# Patient Record
Sex: Female | Born: 1975 | Race: White | Hispanic: No | Smoking: Current some day smoker
Health system: Southern US, Community
[De-identification: ages and names within clinical notes are randomized; demographics above are authoritative.]

## PROBLEM LIST (undated history)

## (undated) DIAGNOSIS — F419 Anxiety disorder, unspecified: Secondary | ICD-10-CM

## (undated) DIAGNOSIS — C539 Malignant neoplasm of cervix uteri, unspecified: Secondary | ICD-10-CM

## (undated) DIAGNOSIS — M25539 Pain in unspecified wrist: Secondary | ICD-10-CM

## (undated) DIAGNOSIS — Z923 Personal history of irradiation: Secondary | ICD-10-CM

## (undated) DIAGNOSIS — Z87442 Personal history of urinary calculi: Secondary | ICD-10-CM

## (undated) DIAGNOSIS — E119 Type 2 diabetes mellitus without complications: Secondary | ICD-10-CM

## (undated) DIAGNOSIS — C50919 Malignant neoplasm of unspecified site of unspecified female breast: Secondary | ICD-10-CM

## (undated) DIAGNOSIS — Z8489 Family history of other specified conditions: Secondary | ICD-10-CM

## (undated) DIAGNOSIS — I10 Essential (primary) hypertension: Secondary | ICD-10-CM

## (undated) DIAGNOSIS — R32 Unspecified urinary incontinence: Secondary | ICD-10-CM

## (undated) DIAGNOSIS — I82409 Acute embolism and thrombosis of unspecified deep veins of unspecified lower extremity: Secondary | ICD-10-CM

## (undated) DIAGNOSIS — R87619 Unspecified abnormal cytological findings in specimens from cervix uteri: Secondary | ICD-10-CM

## (undated) DIAGNOSIS — Z9221 Personal history of antineoplastic chemotherapy: Secondary | ICD-10-CM

## (undated) DIAGNOSIS — Z9289 Personal history of other medical treatment: Secondary | ICD-10-CM

## (undated) DIAGNOSIS — G629 Polyneuropathy, unspecified: Secondary | ICD-10-CM

## (undated) HISTORY — PX: TONSILLECTOMY AND ADENOIDECTOMY: SHX28

## (undated) HISTORY — DX: Anxiety disorder, unspecified: F41.9

## (undated) HISTORY — PX: OPEN REDUCTION INTERNAL FIXATION (ORIF) TIBIA/FIBULA FRACTURE: SHX5992

## (undated) HISTORY — DX: Malignant neoplasm of cervix uteri, unspecified: C53.9

## (undated) HISTORY — DX: Unspecified urinary incontinence: R32

## (undated) HISTORY — DX: Unspecified abnormal cytological findings in specimens from cervix uteri: R87.619

## (undated) HISTORY — PX: PORT-A-CATH REMOVAL: SHX5289

## (undated) HISTORY — DX: Essential (primary) hypertension: I10

---

## 1997-03-19 HISTORY — PX: CHOLECYSTECTOMY: SHX55

## 2011-03-20 HISTORY — PX: OPEN REDUCTION INTERNAL FIXATION (ORIF) TIBIA/FIBULA FRACTURE: SHX5992

## 2012-03-19 DIAGNOSIS — Z9221 Personal history of antineoplastic chemotherapy: Secondary | ICD-10-CM

## 2012-03-19 DIAGNOSIS — Z923 Personal history of irradiation: Secondary | ICD-10-CM

## 2012-03-19 HISTORY — DX: Personal history of irradiation: Z92.3

## 2012-03-19 HISTORY — DX: Personal history of antineoplastic chemotherapy: Z92.21

## 2012-03-19 HISTORY — PX: BREAST LUMPECTOMY: SHX2

## 2012-10-12 DIAGNOSIS — R87810 Cervical high risk human papillomavirus (HPV) DNA test positive: Secondary | ICD-10-CM | POA: Insufficient documentation

## 2012-10-13 ENCOUNTER — Telehealth: Payer: Self-pay | Admitting: *Deleted

## 2012-10-13 DIAGNOSIS — C50319 Malignant neoplasm of lower-inner quadrant of unspecified female breast: Secondary | ICD-10-CM | POA: Insufficient documentation

## 2012-10-13 DIAGNOSIS — C50311 Malignant neoplasm of lower-inner quadrant of right female breast: Secondary | ICD-10-CM

## 2012-10-13 NOTE — Telephone Encounter (Signed)
Pt called stating she would was recently dx with breast cancer in [place], [place] and would like to come to our breast clinic.  Confirmed appt on 10/22/12 at 1200.  Gave pt directions.  Requested all pathology and imaging.  Pt denies further needs at this time.

## 2012-10-16 ENCOUNTER — Other Ambulatory Visit: Payer: Self-pay | Admitting: Oncology

## 2012-10-16 ENCOUNTER — Other Ambulatory Visit: Payer: Self-pay | Admitting: *Deleted

## 2012-10-16 DIAGNOSIS — C50919 Malignant neoplasm of unspecified site of unspecified female breast: Secondary | ICD-10-CM

## 2012-10-16 HISTORY — DX: Malignant neoplasm of unspecified site of unspecified female breast: C50.919

## 2012-10-17 ENCOUNTER — Other Ambulatory Visit: Payer: Self-pay | Admitting: *Deleted

## 2012-10-17 ENCOUNTER — Other Ambulatory Visit: Payer: Self-pay | Admitting: Oncology

## 2012-10-17 DIAGNOSIS — C50912 Malignant neoplasm of unspecified site of left female breast: Secondary | ICD-10-CM

## 2012-10-17 DIAGNOSIS — C50919 Malignant neoplasm of unspecified site of unspecified female breast: Secondary | ICD-10-CM

## 2012-10-22 ENCOUNTER — Other Ambulatory Visit (INDEPENDENT_AMBULATORY_CARE_PROVIDER_SITE_OTHER): Payer: Self-pay

## 2012-10-22 ENCOUNTER — Other Ambulatory Visit (HOSPITAL_BASED_OUTPATIENT_CLINIC_OR_DEPARTMENT_OTHER): Payer: BC Managed Care – PPO | Admitting: Lab

## 2012-10-22 ENCOUNTER — Ambulatory Visit (HOSPITAL_BASED_OUTPATIENT_CLINIC_OR_DEPARTMENT_OTHER): Payer: BC Managed Care – PPO | Admitting: General Surgery

## 2012-10-22 ENCOUNTER — Ambulatory Visit (HOSPITAL_BASED_OUTPATIENT_CLINIC_OR_DEPARTMENT_OTHER): Payer: BC Managed Care – PPO | Admitting: Oncology

## 2012-10-22 ENCOUNTER — Encounter: Payer: Self-pay | Admitting: *Deleted

## 2012-10-22 ENCOUNTER — Encounter: Payer: Self-pay | Admitting: Oncology

## 2012-10-22 ENCOUNTER — Ambulatory Visit
Admission: RE | Admit: 2012-10-22 | Discharge: 2012-10-22 | Disposition: A | Discharge status: Home / Self Care | Payer: BC Managed Care – PPO | Source: Ambulatory Visit | Attending: Radiation Oncology | Admitting: Radiation Oncology

## 2012-10-22 ENCOUNTER — Ambulatory Visit: Payer: BC Managed Care – PPO | Attending: General Surgery | Admitting: Physical Therapy

## 2012-10-22 ENCOUNTER — Encounter (INDEPENDENT_AMBULATORY_CARE_PROVIDER_SITE_OTHER): Payer: Self-pay | Admitting: General Surgery

## 2012-10-22 ENCOUNTER — Ambulatory Visit: Payer: Self-pay

## 2012-10-22 ENCOUNTER — Encounter: Payer: Self-pay | Admitting: Specialist

## 2012-10-22 ENCOUNTER — Ambulatory Visit
Admission: RE | Admit: 2012-10-22 | Discharge: 2012-10-22 | Disposition: A | Discharge status: Home / Self Care | Payer: BC Managed Care – PPO | Source: Ambulatory Visit | Attending: Oncology | Admitting: Oncology

## 2012-10-22 ENCOUNTER — Telehealth (INDEPENDENT_AMBULATORY_CARE_PROVIDER_SITE_OTHER): Payer: Self-pay

## 2012-10-22 VITALS — BP 136/85 | HR 85 | Temp 98.0°F | Resp 20 | Ht 69.0 in | Wt 253.5 lb

## 2012-10-22 DIAGNOSIS — F17200 Nicotine dependence, unspecified, uncomplicated: Secondary | ICD-10-CM

## 2012-10-22 DIAGNOSIS — C50319 Malignant neoplasm of lower-inner quadrant of unspecified female breast: Secondary | ICD-10-CM

## 2012-10-22 DIAGNOSIS — C50912 Malignant neoplasm of unspecified site of left female breast: Secondary | ICD-10-CM

## 2012-10-22 DIAGNOSIS — C50312 Malignant neoplasm of lower-inner quadrant of left female breast: Secondary | ICD-10-CM

## 2012-10-22 DIAGNOSIS — R293 Abnormal posture: Secondary | ICD-10-CM | POA: Insufficient documentation

## 2012-10-22 DIAGNOSIS — C50311 Malignant neoplasm of lower-inner quadrant of right female breast: Secondary | ICD-10-CM

## 2012-10-22 DIAGNOSIS — Z170 Estrogen receptor positive status [ER+]: Secondary | ICD-10-CM

## 2012-10-22 DIAGNOSIS — IMO0001 Reserved for inherently not codable concepts without codable children: Secondary | ICD-10-CM | POA: Insufficient documentation

## 2012-10-22 LAB — CBC WITH DIFFERENTIAL/PLATELET
BASO%: 0.4 % (ref 0.0–2.0)
Basophils Absolute: 0.1 10*3/uL (ref 0.0–0.1)
EOS%: 1.6 % (ref 0.0–7.0)
HGB: 13.3 g/dL (ref 11.6–15.9)
MCH: 31.1 pg (ref 25.1–34.0)
MCHC: 34.3 g/dL (ref 31.5–36.0)
MCV: 90.7 fL (ref 79.5–101.0)
MONO%: 6.5 % (ref 0.0–14.0)
RBC: 4.28 10*6/uL (ref 3.70–5.45)
RDW: 13.6 % (ref 11.2–14.5)

## 2012-10-22 LAB — COMPREHENSIVE METABOLIC PANEL (CC13)
ALT: 20 U/L (ref 0–55)
AST: 18 U/L (ref 5–34)
Albumin: 3.4 g/dL — ABNORMAL LOW (ref 3.5–5.0)
Alkaline Phosphatase: 74 U/L (ref 40–150)
BUN: 12.7 mg/dL (ref 7.0–26.0)
Potassium: 3.5 mEq/L (ref 3.5–5.1)

## 2012-10-22 MED ORDER — GADOBENATE DIMEGLUMINE 529 MG/ML IV SOLN
20.0000 mL | Freq: Once | INTRAVENOUS | Status: AC | PRN
  Administered 2012-10-22: 20 mL via INTRAVENOUS

## 2012-10-22 NOTE — Telephone Encounter (Signed)
Message copied by Brennan Bailey on Wed Oct 22, 2012  5:11 PM ------      Message from: Caleen Essex III      Created: Wed Oct 22, 2012  3:47 PM       She needs a referral to Dr. Odis Luster in case her genetics is positive. thanks ------

## 2012-10-22 NOTE — Progress Notes (Signed)
Patient ID: [name], female   DOB: 1975/03/22, 37 y.o.   MRN: [id number]  No chief complaint on file.   HPI [name] is a 37 y.o. female.  We are asked to see the pt in consultation by Dr. Jean Rosenthal to evaluate her for left breast cancer. The pt is a 37 yo wf who recently went for her first baseline mammogram. At that time she was found to have a mass in the 8 o clock position of the left breast. 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 She denies any breast pain or discharge from the nipple. She had genetics done in [place]. She does have a grandmother with breast cancer. The mass measured 1.6cm by u/s. She was er and pr + and her2 neg. HPI  Past Medical History  Diagnosis Date  . Hypertension   . Anxiety     Past Surgical History  Procedure Laterality Date  . Cholecystectomy    . Tonsillectomy    . Cesarean section      Family History  Problem Relation Age of Onset  . Breast cancer Maternal Grandmother   . Lung cancer Paternal Grandmother     Social History History  Substance Use Topics  . Smoking status: Current Every Day Smoker -- 1.00 packs/day    Types: Cigarettes  . Smokeless tobacco: Not on file  . Alcohol Use: Yes     Comment: 1 drink a week    Allergies  Allergen Reactions  . Amoxicillin     Per pt "does not work"    Current Outpatient Prescriptions  Medication Sig Dispense Refill  . ALPRAZolam (XANAX) 0.25 MG tablet Take 0.25 mg by mouth at bedtime as needed for sleep.      . hydrochlorothiazide (HYDRODIURIL) 25 MG tablet Take 25 mg by mouth daily.      Marland Kitchen lisinopril (PRINIVIL,ZESTRIL) 10 MG tablet Take 10 mg by mouth daily.       No current facility-administered medications for this visit.    Review of Systems Review of Systems  Constitutional: Negative.   HENT: Negative.   Eyes: Negative.   Respiratory: Negative.   Cardiovascular: Negative.   Gastrointestinal: Negative.   Endocrine: Negative.   Genitourinary:  Negative.   Musculoskeletal: Negative.   Skin: Negative.   Allergic/Immunologic: Negative.   Neurological: Negative.   Hematological: Negative.   Psychiatric/Behavioral: Negative.     Last menstrual period 10/02/2012.  Physical Exam Physical Exam  Constitutional: She is oriented to person, place, and time. She appears well-developed and well-nourished.  HENT:  Head: Normocephalic and atraumatic.  Eyes: Conjunctivae and EOM are normal. Pupils are equal, round, and reactive to light.  Neck: Normal range of motion. Neck supple.  Cardiovascular: Normal rate, regular rhythm and normal heart sounds.   Pulmonary/Chest: Effort normal and breath sounds normal.  There is no palpable mass in either breast. There is no palpable axillary, supraclavicular, or cervical lymphadenopathy  Abdominal: Soft. Bowel sounds are normal. She exhibits no mass. There is no tenderness.  Musculoskeletal: Normal range of motion.  Lymphadenopathy:    She has no cervical adenopathy.  Neurological: She is alert and oriented to person, place, and time.  Skin: Skin is warm and dry.  Psychiatric: She has a normal mood and affect. Her behavior is normal.    Data Reviewed As above  Assessment    The pt appears to have a stage I left breast cancer. I have discussed with her in detail the different options  for treatment and she favors breast conservation as long as her genetic testing is negative. I think she is a good candidate for this as well as sentinel node mapping. I have discussed with her in detail the risks and benefits of surgery as well as some of the technical aspects and she understands and wishes to proceed     Plan    Plan for left breast wire localized lumpectomy and sentinel node mapping if her genetics is negative. Will refer to plastics in case genetics is positive        TOTH III,PAUL S 10/22/2012, 3:38 PM

## 2012-10-22 NOTE — Progress Notes (Signed)
[facility] Radiation Oncology NEW PATIENT EVALUATION  Name: [name] MRN: [id number]  Date:   10/22/2012           DOB: [birth year]  Status: outpatient   CC: 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 female who is seen today at the [facility] with Dr. Carolynne Edouard and Dr. Welton Flakes for evaluation of her T1 N0 invasive ductal carcinoma of the left breast. At the time of a screening mammogram in [place] on 09/23/2012 she is felt to have a new mass within the left breast. Additional views and ultrasound on 10/06/2012 showed a 1.6 x 1.3 x 1.3 cm mass at 8:00 position, 7 cm from the nipple. At the 12:00 position behind the nipple was a circumscribed oval shaped 2 x 1.8 x 1.2 cm lesion perhaps representing a fibroadenoma. Ultrasound-guided biopsies on 10/06/2012 revealed fibrocystic changes at 12:00 and invasive ductal carcinoma at 8:00. The carcinoma was grade II-III with a Ki-67 of approximately 25%. HER-2/neu was not amplified. She was ER/PR positive at 80%. Her pathology was reviewed here by Dr.  Colonel Bald.  She apparently had blood work sent off for genetic testing while in  [employer] yesterday. She has not spoken with a Runner, broadcasting/film/video. She had breast MRI scans earlier today. Report is not available    PREVIOUS RADIATION THERAPY: No   PAST MEDICAL HISTORY:  has a past medical history of Hypertension and Anxiety.     PAST SURGICAL HISTORY:  Past Surgical History  Procedure Laterality Date  . Cholecystectomy    . Tonsillectomy    . Cesarean section       FAMILY HISTORY: family history includes Breast cancer in her maternal grandmother and Lung cancer in her paternal grandmother. Single, 70 year old daughter. She works for [employer], site in [place]. She is a Designer, multimedia.  SOCIAL HISTORY:  reports that  she has been smoking Cigarettes.  She has been smoking about 1.00 pack per day. She does not have any smokeless tobacco history on file. She reports that  drinks alcohol. Her mother is alive and well 5 in her father is alive and well at 53. Her maternal grandmother was diagnosed with breast cancer in her 30s and lived to be 91. No other family history of breast cancer.   ALLERGIES: Amoxicillin   MEDICATIONS:  Current Outpatient Prescriptions  Medication Sig Dispense Refill  . ALPRAZolam (XANAX) 0.25 MG tablet Take 0.25 mg by mouth at bedtime as needed for sleep.      . hydrochlorothiazide (HYDRODIURIL) 25 MG tablet Take 25 mg by mouth daily.      Marland Kitchen lisinopril (PRINIVIL,ZESTRIL) 10 MG tablet Take 10 mg by mouth daily.       No current facility-administered medications for this encounter.     REVIEW OF SYSTEMS:  Pertinent items are noted in HPI.    PHYSICAL EXAM:  Alert and oriented 37 year old white female appearing her stated age. Wt Readings from Last 3 Encounters:  10/22/12 253 lb 8 oz (114.987 kg)   Temp Readings from Last 3 Encounters:  10/22/12 98 F (36.7 C) Oral   BP Readings from Last 3 Encounters:  10/22/12 136/85   Pulse Readings from Last 3 Encounters:  10/22/12 85   Head and neck examination: Grossly unremarkable. Nodes:  Without palpable cervical, supraclavicular, or axillary lymphadenopathy. Chest: Lungs clear. Heart: Regular in rhythm. Breasts: There is a palpable mass at approximately 8:00 along the lower inner quadrant of the left breast. This measured approximately 1.5 cm, perhaps representing a hematoma from her biopsy. No other masses are appreciated. Right breast without masses or lesions. Abdomen without hepatomegaly. Extremities: Without edema.    LABORATORY DATA:  Lab Results  Component Value Date   WBC 11.4* 10/22/2012   HGB 13.3 10/22/2012   HCT 38.8 10/22/2012   MCV 90.7 10/22/2012   PLT 249 10/22/2012   Lab Results  Component Value Date   NA 137  10/22/2012   K 3.5 10/22/2012   CO2 25 10/22/2012   Lab Results  Component Value Date   ALT 20 10/22/2012   AST 18 10/22/2012   ALKPHOS 74 10/22/2012   BILITOT 0.69 10/22/2012      IMPRESSION: Clinical stage I (T1, N0, M0) invasive ductal carcinoma of the left breast. Based on her family history we will await the findings of her genetic testing before proceeding with surgery. If she is found be positive then we would recommend bilateral mastectomies with perhaps immediate reconstruction bilaterally. If she is genetically negative then she would be a candidate for breast preservation with partial mastectomy and sentinel lymph node biopsy and postoperative radiation therapy. Dr. Welton Flakes may obtain Oncotype DX testing. We discussed the potential acute and late toxicities of radiation therapy. With her left-sided breast cancer we could avoid her heart with deep inspiration/breath-hold technology. I would offer her standard fractionation. A formal recommendation was made following the results of her genetic testing and definitive surgery.   PLAN: As discussed above. We will also need to review her MRI scan performed earlier today. I spent 30 minutes minutes face to face with the patient and more than 50% of that time was spent in counseling and/or coordination of care.

## 2012-10-22 NOTE — Telephone Encounter (Signed)
Referral to Dr Odis Luster in epic and sent to coordinator to schedule

## 2012-10-22 NOTE — Progress Notes (Signed)
[name] [id number] 04/10/1975 37 y.o. 10/22/2012 2:02 PM  CC  [name] [address] [place] [address] Dr. Chevis Pretty Dr. Chipper Herb  REASON FOR CONSULTATION:  37 year old female with new diagnosis of invasive ductal carcinoma of the lower inner quadrant of the left breast. Patient is seen in the multidisciplinary breast clinic for discussion of treatment options.  STAGE:   Cancer of lower-inner quadrant of female breast   Primary site: Breast (Left)   Staging method: AJCC 7th Edition   Clinical: Stage IA (T1c, N0, cM0)   Summary: Stage IA (T1c, N0, cM0)  REFERRING PHYSICIAN: Dr. Chevis Pretty  HISTORY OF PRESENT ILLNESS:  [name] is a 37 y.o. female.  Patient had a screening mammogram in [place] on 09/23/2012 she was felt to have a new mass within the left breast. She had ultrasound on 10/06/2012 which showed a 1.6 x 1.3 x 1.3 cm mass at the 8:00 position 7 cm from the nipple. At the 12:00 position behind and nipple was circumscribed oval shaped 2 x 1.8 x 1.2 cm lesion perhaps representing a fibroadenoma. Ultrasound-guided biopsies were performed on 10/06/2012 that revealed fibrocystic changes at the 12:00 and invasive ductal carcinoma at the 8:00 position. The carcinoma was grade 2-3 with a Ki-67 25% HER-2/neu negative. ER positive PR positive at 80%. Her pathology from [facility] was reviewed by Dr. Colonel Bald. Patient did have blood work performed for genetic testing in [place] day prior to her visit today. However she has not been seen formally by genetic counselor. Patient had MRIs of the breasts performed as well scans are not available at this time. Her case was discussed in the multidisciplinary breast conference. Pathology and radiology were reviewed. Patient is without any complaints   Past Medical History: Past Medical History  Diagnosis Date  . Hypertension   . Anxiety     Past Surgical History: Past Surgical History  Procedure Laterality Date   . Cholecystectomy    . Tonsillectomy    . Cesarean section      Family History: Family History  Problem Relation Age of Onset  . Breast cancer Maternal Grandmother   . Lung cancer Paternal Grandmother     Social History History  Substance Use Topics  . Smoking status: Current Every Day Smoker -- 1.00 packs/day    Types: Cigarettes  . Smokeless tobacco: Not on file  . Alcohol Use: Yes     Comment: 1 drink a week    Allergies: Allergies  Allergen Reactions  . Amoxicillin     Per pt "does not work"    Current Medications: Current Outpatient Prescriptions  Medication Sig Dispense Refill  . ALPRAZolam (XANAX) 0.25 MG tablet Take 0.25 mg by mouth at bedtime as needed for sleep.      . hydrochlorothiazide (HYDRODIURIL) 25 MG tablet Take 25 mg by mouth daily.      Marland Kitchen lisinopril (PRINIVIL,ZESTRIL) 10 MG tablet Take 10 mg by mouth daily.       No current facility-administered medications for this visit.    OB/GYN History:menarche at age 89 she is premenopausal last menstrual period was on 09/29/2012. She's had 1 term pregnancy at the age of 82.  Fertility Discussion: patient has completed her family Prior History of Cancer:no prior history of cancers  Health Maintenance:  Colonoscopy no Bone Density no Last PAP smear up-to-date  ECOG PERFORMANCE STATUS: 0 - Asymptomatic  Genetic Counseling/testing: we will followup on the results on her genetic testing from [facility]. We  will also refer her to genetic counselor.  REVIEW OF SYSTEMS:  A comprehensive review of systems was negative.  PHYSICAL EXAMINATION: Blood pressure 136/85, pulse 85, temperature 98 F (36.7 C), temperature source Oral, resp. rate 20, height 5\' 9"  (1.753 m), weight 253 lb 8 oz (114.987 kg), last menstrual period 10/02/2012.  ZOX:WRUEA, healthy, no distress, well nourished and well developed SKIN: skin color, texture, turgor are normal HEAD: Normocephalic EYES: PERRLA, EOMI, Conjunctiva are pink  and non-injected EARS: External ears normal OROPHARYNX:no exudate, no erythema and lips, buccal mucosa, and tongue normal  NECK: supple, no adenopathy LYMPH:  no palpable lymphadenopathy, no hepatosplenomegaly BREAST:breasts appear normal, no suspicious masses, no skin or nipple changes or axillary nodes LUNGS: clear to auscultation and percussion HEART: regular rate & rhythm ABDOMEN:abdomen soft, non-tender, obese and normal bowel sounds BACK: No CVA tenderness EXTREMITIES:no edema, no clubbing, no cyanosis  NEURO: alert & oriented x 3 with fluent speech, no focal motor/sensory deficits, gait normal     STUDIES/RESULTS: No results found.   LABS:    Chemistry      Component Value Date/Time   NA 137 10/22/2012 1217   K 3.5 10/22/2012 1217   CO2 25 10/22/2012 1217   BUN 12.7 10/22/2012 1217   CREATININE 0.8 10/22/2012 1217      Component Value Date/Time   CALCIUM 9.6 10/22/2012 1217   ALKPHOS 74 10/22/2012 1217   AST 18 10/22/2012 1217   ALT 20 10/22/2012 1217   BILITOT 0.69 10/22/2012 1217      Lab Results  Component Value Date   WBC 11.4* 10/22/2012   HGB 13.3 10/22/2012   HCT 38.8 10/22/2012   MCV 90.7 10/22/2012   PLT 249 10/22/2012       PATHOLOGY:  ASSESSMENT    37 year old female with  #1 new diagnosis of invasive ductal carcinoma of the left breast found on a screening mammogram in [place]. Biopsy showed a invasive ductal carcinoma ER/PR positive HER-2/neu negative with a Ki-67 of 25%. Patient is a good candidate for lumpectomy and sentinel lymph node biopsy. Rationale for this was discussed with the patient.  #2 we also discussed adjuvant therapy. I do think she is a good candidate for Oncotype testing to obtain her best breast cancer recurrence score in to see whether or not she needs chemotherapy versus antiestrogen therapy alone.  #3 patient was also seen by Dr. Chipper Herb for post lumpectomy radiation therapy.  Clinical Trial  Eligibility:no Multidisciplinary conference discussion yes     PLAN:    #1 patient will proceed with lumpectomy and sentinel lymph node biopsy.  #2 we will plan on seeing her back after her surgery. She will also have Oncotype DX testing performed and        Discussion: Patient is being treated per NCCN breast cancer care guidelines appropriate for stage.I   Thank you so much for allowing me to participate in the care of [place]. I will continue to follow up the patient with you and assist in her care.  All questions were answered. The patient knows to call the clinic with any problems, questions or concerns. We can certainly see the patient much sooner if necessary.  I spent 40 minutes counseling the patient face to face. The total time spent in the appointment was 55 minutes.  Drue Second, MD Medical/Oncology [facility] [phone] (beeper) [phone] (Office)  10/22/2012, 2:02 PM

## 2012-10-22 NOTE — Progress Notes (Signed)
I met Ms. Craigo today in the multidisciplinary clinic. She was accompanied by her mother.  On the scale, Ms. Raggio rated her distress as "6", however she indicated her distress was lower after seeing the physicians.  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  I am making a referral to [facility] but will ask them to hold it until Ms. Lagasse's treatment plan is confirmed.  I also gave her contact information for the Patient and Family Support Team.

## 2012-10-23 ENCOUNTER — Telehealth: Payer: Self-pay | Admitting: Oncology

## 2012-10-24 ENCOUNTER — Telehealth (INDEPENDENT_AMBULATORY_CARE_PROVIDER_SITE_OTHER): Payer: Self-pay | Admitting: General Surgery

## 2012-10-24 NOTE — Telephone Encounter (Signed)
Spoke with pt and informed her that she is scheduled with Dr. Kelly Splinter on 11/18/12 at 10:30.  Information was faxed to Dr. Leonie Green office and the confirmation was received.

## 2012-10-28 ENCOUNTER — Telehealth: Payer: Self-pay | Admitting: *Deleted

## 2012-10-28 NOTE — Telephone Encounter (Signed)
Left message for a return phone call.  Awaiting patient response. 

## 2012-11-10 ENCOUNTER — Other Ambulatory Visit (INDEPENDENT_AMBULATORY_CARE_PROVIDER_SITE_OTHER): Payer: Self-pay | Admitting: General Surgery

## 2012-11-10 DIAGNOSIS — C50912 Malignant neoplasm of unspecified site of left female breast: Secondary | ICD-10-CM

## 2012-11-11 ENCOUNTER — Telehealth (INDEPENDENT_AMBULATORY_CARE_PROVIDER_SITE_OTHER): Payer: Self-pay

## 2012-11-11 ENCOUNTER — Telehealth (INDEPENDENT_AMBULATORY_CARE_PROVIDER_SITE_OTHER): Payer: Self-pay | Admitting: General Surgery

## 2012-11-11 NOTE — Telephone Encounter (Signed)
Patient called back and was given below message.

## 2012-11-11 NOTE — Telephone Encounter (Signed)
Pt called sx scheduling wants her sx scheduled. Appears seeing Dr Kelly Splinter 9/2 Jarrett Ables in [facility] but no face sheet on file/ please contact pt

## 2012-11-11 NOTE — Telephone Encounter (Signed)
Orders received yesterday and just put in box for you.

## 2012-11-11 NOTE — Telephone Encounter (Signed)
LMOM to call back. Received surgery orders late yesterday an just sent them to schedulers this morning. They will be contacting her to schedule surgery.

## 2012-11-12 ENCOUNTER — Telehealth: Payer: Self-pay | Admitting: Oncology

## 2012-11-12 NOTE — Telephone Encounter (Signed)
, °

## 2012-11-12 NOTE — Telephone Encounter (Signed)
If she is having lumpectomy which i think she is then she does not need to see sanger

## 2012-11-13 ENCOUNTER — Telehealth: Payer: Self-pay | Admitting: Oncology

## 2012-11-18 ENCOUNTER — Telehealth: Payer: Self-pay | Admitting: *Deleted

## 2012-11-18 NOTE — Telephone Encounter (Signed)
Called and spoke with patient and confirmed new appt for 12/30/12 with Dr. Welton Flakes. Rescheduled her appt. Due to oncotype results.

## 2012-11-28 ENCOUNTER — Encounter (HOSPITAL_COMMUNITY): Payer: Self-pay | Admitting: Pharmacy Technician

## 2012-12-03 ENCOUNTER — Encounter (HOSPITAL_COMMUNITY)
Admission: RE | Admit: 2012-12-03 | Discharge: 2012-12-03 | Disposition: A | Discharge status: Home / Self Care | Payer: BC Managed Care – PPO | Source: Ambulatory Visit | Attending: General Surgery | Admitting: General Surgery

## 2012-12-03 ENCOUNTER — Encounter (HOSPITAL_COMMUNITY)
Admission: RE | Admit: 2012-12-03 | Discharge: 2012-12-03 | Disposition: A | Discharge status: Home / Self Care | Payer: BC Managed Care – PPO | Source: Ambulatory Visit | Attending: Anesthesiology | Admitting: Anesthesiology

## 2012-12-03 ENCOUNTER — Encounter (HOSPITAL_COMMUNITY): Payer: Self-pay

## 2012-12-03 DIAGNOSIS — Z01810 Encounter for preprocedural cardiovascular examination: Secondary | ICD-10-CM | POA: Insufficient documentation

## 2012-12-03 DIAGNOSIS — Z01812 Encounter for preprocedural laboratory examination: Secondary | ICD-10-CM | POA: Insufficient documentation

## 2012-12-03 DIAGNOSIS — Z01818 Encounter for other preprocedural examination: Secondary | ICD-10-CM | POA: Insufficient documentation

## 2012-12-03 HISTORY — DX: Family history of other specified conditions: Z84.89

## 2012-12-03 LAB — CBC
HCT: 38.1 % (ref 36.0–46.0)
MCH: 31.7 pg (ref 26.0–34.0)
MCV: 91.6 fL (ref 78.0–100.0)
Platelets: 248 10*3/uL (ref 150–400)
RBC: 4.16 MIL/uL (ref 3.87–5.11)
WBC: 10.1 10*3/uL (ref 4.0–10.5)

## 2012-12-03 LAB — BASIC METABOLIC PANEL
CO2: 26 mEq/L (ref 19–32)
Calcium: 8.8 mg/dL (ref 8.4–10.5)
Chloride: 103 mEq/L (ref 96–112)
Glucose, Bld: 146 mg/dL — ABNORMAL HIGH (ref 70–99)
Sodium: 138 mEq/L (ref 135–145)

## 2012-12-03 MED ORDER — CHLORHEXIDINE GLUCONATE 4 % EX LIQD: 1.0000 "application " | Freq: Once | CUTANEOUS | Status: DC

## 2012-12-03 NOTE — Pre-Procedure Instructions (Signed)
[name]  12/03/2012   Your procedure is scheduled on:  Wed, Sept 24 @ 10:00 AM  Report to [facility] at 8:00 AM.  Call this number if you have problems the morning of surgery: [phone]   Remember:   Do not eat food or drink liquids after midnight.                 No Goody's,BC's,Aleve,Aspirin,Ibuprofen,Fish Oil,or any Herbal Medications    Do not wear jewelry, make-up or nail polish.  Do not wear lotions, powders, or perfumes. You may wear deodorant.  Do not shave 48 hours prior to surgery.   Do not bring valuables to the hospital.  [facility] is not responsible                   for any belongings or valuables.  Contacts, dentures or bridgework may not be worn into surgery.  Leave suitcase in the car. After surgery it may be brought to your room.  For patients admitted to the hospital, checkout time is 11:00 AM the day of  discharge.   Patients discharged the day of surgery will not be allowed to drive  home.    Special Instructions: Shower using CHG 2 nights before surgery and the night before surgery.  If you shower the day of surgery use CHG.  Use special wash - you have one bottle of CHG for all showers.  You should use approximately 1/3 of the bottle for each shower.   Please read over the following fact sheets that you were given: Pain Booklet, Coughing and Deep Breathing and Surgical Site Infection Prevention

## 2012-12-03 NOTE — Progress Notes (Signed)
Pt doesn't have a cardiologist   Denies ever having an echo/stress test/heart cath  Medical Md is Dr.Lahti in [place]  [facility]  Denies EKG or CXR in past yr

## 2012-12-09 MED ORDER — VANCOMYCIN HCL IN DEXTROSE 1-5 GM/200ML-% IV SOLN
1000.0000 mg | INTRAVENOUS | Status: AC
  Administered 2012-12-10: 1000 mg via INTRAVENOUS
  Filled 2012-12-09: qty 200

## 2012-12-10 ENCOUNTER — Ambulatory Visit (HOSPITAL_COMMUNITY): Payer: BC Managed Care – PPO | Admitting: Certified Registered Nurse Anesthetist

## 2012-12-10 ENCOUNTER — Ambulatory Visit: Payer: BC Managed Care – PPO | Admitting: Oncology

## 2012-12-10 ENCOUNTER — Encounter (HOSPITAL_COMMUNITY)
Admission: RE | Disposition: A | Discharge status: Home / Self Care | Payer: Self-pay | Source: Ambulatory Visit | Attending: General Surgery

## 2012-12-10 ENCOUNTER — Ambulatory Visit
Admission: RE | Admit: 2012-12-10 | Discharge: 2012-12-10 | Disposition: A | Discharge status: Home / Self Care | Payer: BC Managed Care – PPO | Source: Ambulatory Visit | Attending: General Surgery | Admitting: General Surgery

## 2012-12-10 ENCOUNTER — Encounter (HOSPITAL_COMMUNITY): Payer: Self-pay | Admitting: Certified Registered Nurse Anesthetist

## 2012-12-10 ENCOUNTER — Ambulatory Visit (HOSPITAL_COMMUNITY)
Admission: RE | Admit: 2012-12-10 | Discharge: 2012-12-10 | Disposition: A | Discharge status: Home / Self Care | Payer: BC Managed Care – PPO | Source: Ambulatory Visit | Attending: General Surgery | Admitting: General Surgery

## 2012-12-10 ENCOUNTER — Other Ambulatory Visit (INDEPENDENT_AMBULATORY_CARE_PROVIDER_SITE_OTHER): Payer: Self-pay | Admitting: General Surgery

## 2012-12-10 ENCOUNTER — Encounter (HOSPITAL_COMMUNITY)
Admission: RE | Admit: 2012-12-10 | Discharge: 2012-12-10 | Disposition: A | Discharge status: Home / Self Care | Payer: BC Managed Care – PPO | Source: Ambulatory Visit | Attending: General Surgery | Admitting: General Surgery

## 2012-12-10 DIAGNOSIS — D0590 Unspecified type of carcinoma in situ of unspecified breast: Secondary | ICD-10-CM

## 2012-12-10 DIAGNOSIS — C50919 Malignant neoplasm of unspecified site of unspecified female breast: Secondary | ICD-10-CM | POA: Insufficient documentation

## 2012-12-10 DIAGNOSIS — I10 Essential (primary) hypertension: Secondary | ICD-10-CM | POA: Insufficient documentation

## 2012-12-10 DIAGNOSIS — C50912 Malignant neoplasm of unspecified site of left female breast: Secondary | ICD-10-CM

## 2012-12-10 DIAGNOSIS — C773 Secondary and unspecified malignant neoplasm of axilla and upper limb lymph nodes: Secondary | ICD-10-CM | POA: Insufficient documentation

## 2012-12-10 DIAGNOSIS — C50312 Malignant neoplasm of lower-inner quadrant of left female breast: Secondary | ICD-10-CM

## 2012-12-10 DIAGNOSIS — C50319 Malignant neoplasm of lower-inner quadrant of unspecified female breast: Secondary | ICD-10-CM | POA: Insufficient documentation

## 2012-12-10 DIAGNOSIS — F411 Generalized anxiety disorder: Secondary | ICD-10-CM | POA: Insufficient documentation

## 2012-12-10 HISTORY — PX: BREAST LUMPECTOMY WITH NEEDLE LOCALIZATION AND AXILLARY SENTINEL LYMPH NODE BX: SHX5760

## 2012-12-10 SURGERY — BREAST LUMPECTOMY WITH NEEDLE LOCALIZATION AND AXILLARY SENTINEL LYMPH NODE BX
Anesthesia: General | Site: Breast | Laterality: Left | Wound class: Clean

## 2012-12-10 MED ORDER — OXYCODONE HCL 5 MG PO TABS
5.0000 mg | ORAL_TABLET | Freq: Once | ORAL | Status: AC | PRN
  Administered 2012-12-10: 5 mg via ORAL

## 2012-12-10 MED ORDER — FENTANYL CITRATE 0.05 MG/ML IJ SOLN
INTRAMUSCULAR | Status: DC | PRN
  Administered 2012-12-10 (×3): 50 ug via INTRAVENOUS
  Administered 2012-12-10: 100 ug via INTRAVENOUS
  Administered 2012-12-10 (×3): 50 ug via INTRAVENOUS

## 2012-12-10 MED ORDER — HYDROCODONE-ACETAMINOPHEN 5-325 MG PO TABS: 1.0000 | ORAL_TABLET | ORAL | Status: DC | PRN

## 2012-12-10 MED ORDER — HYDROMORPHONE HCL PF 1 MG/ML IJ SOLN
0.2500 mg | INTRAMUSCULAR | Status: DC | PRN
  Administered 2012-12-10 (×4): 0.5 mg via INTRAVENOUS

## 2012-12-10 MED ORDER — DEXAMETHASONE SODIUM PHOSPHATE 4 MG/ML IJ SOLN
INTRAMUSCULAR | Status: DC | PRN
  Administered 2012-12-10: 8 mg via INTRAVENOUS

## 2012-12-10 MED ORDER — OXYCODONE HCL 5 MG PO TABS
ORAL_TABLET | ORAL | Status: AC
  Filled 2012-12-10: qty 1

## 2012-12-10 MED ORDER — SODIUM CHLORIDE 0.9 % IJ SOLN
INTRAMUSCULAR | Status: DC | PRN
  Administered 2012-12-10: 10:00:00

## 2012-12-10 MED ORDER — METHYLENE BLUE 1 % INJ SOLN
INTRAMUSCULAR | Status: AC
  Filled 2012-12-10: qty 10

## 2012-12-10 MED ORDER — LACTATED RINGERS IV SOLN
INTRAVENOUS | Status: DC | PRN
  Administered 2012-12-10: 09:00:00 via INTRAVENOUS

## 2012-12-10 MED ORDER — 0.9 % SODIUM CHLORIDE (POUR BTL) OPTIME
TOPICAL | Status: DC | PRN
  Administered 2012-12-10: 1000 mL

## 2012-12-10 MED ORDER — HYDROCODONE-ACETAMINOPHEN 5-325 MG PO TABS
ORAL_TABLET | ORAL | Status: AC
  Filled 2012-12-10: qty 2

## 2012-12-10 MED ORDER — PROMETHAZINE HCL 25 MG/ML IJ SOLN: 6.2500 mg | INTRAMUSCULAR | Status: DC | PRN

## 2012-12-10 MED ORDER — LIDOCAINE HCL (CARDIAC) 20 MG/ML IV SOLN
INTRAVENOUS | Status: DC | PRN
  Administered 2012-12-10: 80 mg via INTRAVENOUS

## 2012-12-10 MED ORDER — MIDAZOLAM HCL 5 MG/5ML IJ SOLN
INTRAMUSCULAR | Status: DC | PRN
  Administered 2012-12-10: 2 mg via INTRAVENOUS

## 2012-12-10 MED ORDER — TECHNETIUM TC 99M SULFUR COLLOID FILTERED
1.0000 | Freq: Once | INTRAVENOUS | Status: AC | PRN
  Administered 2012-12-10: 1 via INTRADERMAL

## 2012-12-10 MED ORDER — OXYCODONE HCL 5 MG/5ML PO SOLN: 5.0000 mg | Freq: Once | ORAL | Status: AC | PRN

## 2012-12-10 MED ORDER — BUPIVACAINE-EPINEPHRINE 0.25% -1:200000 IJ SOLN
INTRAMUSCULAR | Status: DC | PRN
  Administered 2012-12-10: 30 mL

## 2012-12-10 MED ORDER — HYDROMORPHONE HCL PF 1 MG/ML IJ SOLN
INTRAMUSCULAR | Status: AC
  Filled 2012-12-10: qty 1

## 2012-12-10 MED ORDER — PHENYLEPHRINE HCL 10 MG/ML IJ SOLN
INTRAMUSCULAR | Status: DC | PRN
  Administered 2012-12-10 (×2): 40 ug via INTRAVENOUS
  Administered 2012-12-10 (×2): 80 ug via INTRAVENOUS
  Administered 2012-12-10: 40 ug via INTRAVENOUS

## 2012-12-10 MED ORDER — ARTIFICIAL TEARS OP OINT
TOPICAL_OINTMENT | OPHTHALMIC | Status: DC | PRN
  Administered 2012-12-10: 1 via OPHTHALMIC

## 2012-12-10 MED ORDER — ONDANSETRON HCL 4 MG/2ML IJ SOLN
INTRAMUSCULAR | Status: DC | PRN
  Administered 2012-12-10: 4 mg via INTRAVENOUS

## 2012-12-10 MED ORDER — LACTATED RINGERS IV SOLN
INTRAVENOUS | Status: DC
  Administered 2012-12-10: 09:00:00 via INTRAVENOUS

## 2012-12-10 MED ORDER — PROPOFOL 10 MG/ML IV BOLUS
INTRAVENOUS | Status: DC | PRN
  Administered 2012-12-10: 170 mg via INTRAVENOUS

## 2012-12-10 MED ORDER — HYDROCODONE-ACETAMINOPHEN 5-325 MG PO TABS
1.0000 | ORAL_TABLET | Freq: Once | ORAL | Status: AC
  Administered 2012-12-10: 2 via ORAL

## 2012-12-10 MED ORDER — BUPIVACAINE-EPINEPHRINE PF 0.25-1:200000 % IJ SOLN
INTRAMUSCULAR | Status: AC
  Filled 2012-12-10: qty 30

## 2012-12-10 SURGICAL SUPPLY — 50 items
APPLIER CLIP 9.375 MED OPEN (MISCELLANEOUS) ×2
BINDER BREAST LRG (GAUZE/BANDAGES/DRESSINGS) IMPLANT
BINDER BREAST XLRG (GAUZE/BANDAGES/DRESSINGS) IMPLANT
BLADE SURG 10 STRL SS (BLADE) ×2 IMPLANT
BLADE SURG 15 STRL LF DISP TIS (BLADE) ×1 IMPLANT
BLADE SURG 15 STRL SS (BLADE) ×1
CANISTER SUCTION 2500CC (MISCELLANEOUS) ×2 IMPLANT
CHLORAPREP W/TINT 26ML (MISCELLANEOUS) ×2 IMPLANT
CLIP APPLIE 9.375 MED OPEN (MISCELLANEOUS) ×1 IMPLANT
CLOTH BEACON ORANGE TIMEOUT ST (SAFETY) IMPLANT
CONT SPEC 4OZ CLIKSEAL STRL BL (MISCELLANEOUS) ×6 IMPLANT
COVER PROBE W GEL 5X96 (DRAPES) ×2 IMPLANT
COVER SURGICAL LIGHT HANDLE (MISCELLANEOUS) ×2 IMPLANT
DECANTER SPIKE VIAL GLASS SM (MISCELLANEOUS) ×2 IMPLANT
DERMABOND ADVANCED (GAUZE/BANDAGES/DRESSINGS) ×1
DERMABOND ADVANCED .7 DNX12 (GAUZE/BANDAGES/DRESSINGS) ×1 IMPLANT
DEVICE DUBIN SPECIMEN MAMMOGRA (MISCELLANEOUS) ×2 IMPLANT
DRAPE CHEST BREAST 15X10 FENES (DRAPES) ×2 IMPLANT
DRAPE UTILITY 15X26 W/TAPE STR (DRAPE) ×6 IMPLANT
ELECT COATED BLADE 2.86 ST (ELECTRODE) ×2 IMPLANT
ELECT REM PT RETURN 9FT ADLT (ELECTROSURGICAL) ×2
ELECTRODE REM PT RTRN 9FT ADLT (ELECTROSURGICAL) ×1 IMPLANT
GLOVE BIO SURGEON STRL SZ7.5 (GLOVE) ×4 IMPLANT
GLOVE BIOGEL PI IND STRL 7.0 (GLOVE) ×1 IMPLANT
GLOVE BIOGEL PI IND STRL 8 (GLOVE) ×1 IMPLANT
GLOVE BIOGEL PI INDICATOR 7.0 (GLOVE) ×1
GLOVE BIOGEL PI INDICATOR 8 (GLOVE) ×1
GLOVE SURG SS PI 7.0 STRL IVOR (GLOVE) ×2 IMPLANT
GOWN STRL NON-REIN LRG LVL3 (GOWN DISPOSABLE) ×4 IMPLANT
KIT BASIN OR (CUSTOM PROCEDURE TRAY) ×2 IMPLANT
KIT MARKER MARGIN INK (KITS) ×2 IMPLANT
KIT ROOM TURNOVER OR (KITS) ×2 IMPLANT
NEEDLE 18GX1X1/2 (RX/OR ONLY) (NEEDLE) ×2 IMPLANT
NEEDLE HYPO 25GX1X1/2 BEV (NEEDLE) ×4 IMPLANT
NS IRRIG 1000ML POUR BTL (IV SOLUTION) ×2 IMPLANT
PACK SURGICAL SETUP 50X90 (CUSTOM PROCEDURE TRAY) ×2 IMPLANT
PAD ARMBOARD 7.5X6 YLW CONV (MISCELLANEOUS) ×2 IMPLANT
PENCIL BUTTON HOLSTER BLD 10FT (ELECTRODE) ×2 IMPLANT
SPONGE LAP 18X18 X RAY DECT (DISPOSABLE) ×2 IMPLANT
SUT MNCRL AB 4-0 PS2 18 (SUTURE) ×2 IMPLANT
SUT SILK 2 0 SH (SUTURE) IMPLANT
SUT VIC AB 3-0 54X BRD REEL (SUTURE) ×1 IMPLANT
SUT VIC AB 3-0 BRD 54 (SUTURE) ×1
SUT VIC AB 3-0 SH 18 (SUTURE) ×2 IMPLANT
SYR BULB 3OZ (MISCELLANEOUS) ×2 IMPLANT
SYR CONTROL 10ML LL (SYRINGE) ×4 IMPLANT
TOWEL OR 17X24 6PK STRL BLUE (TOWEL DISPOSABLE) ×2 IMPLANT
TOWEL OR 17X26 10 PK STRL BLUE (TOWEL DISPOSABLE) ×2 IMPLANT
TUBE CONNECTING 12X1/4 (SUCTIONS) ×2 IMPLANT
YANKAUER SUCT BULB TIP NO VENT (SUCTIONS) ×2 IMPLANT

## 2012-12-10 NOTE — Anesthesia Postprocedure Evaluation (Signed)
  Anesthesia Post-op Note  Patient: [name]  Procedure(s) Performed: Procedure(s) with comments: BREAST LUMPECTOMY WITH NEEDLE LOCALIZATION AND AXILLARY SENTINEL LYMPH NODE BX (Left) - Needle loc BCG 7:30  nuc med 9:30    Patient Location: PACU  Anesthesia Type:General  Level of Consciousness: awake and alert   Airway and Oxygen Therapy: Patient Spontanous Breathing  Post-op Pain: mild  Post-op Assessment: Post-op Vital signs reviewed  Post-op Vital Signs: stable  Complications: No apparent anesthesia complications

## 2012-12-10 NOTE — Anesthesia Preprocedure Evaluation (Signed)
Anesthesia Evaluation  Patient identified by MRN, date of birth, ID band  History of Anesthesia Complications (+) Family history of anesthesia reaction  Airway Mallampati: II  Neck ROM: Full    Dental  (+) Teeth Intact   Pulmonary neg pulmonary ROS,  breath sounds clear to auscultation        Cardiovascular hypertension, Rhythm:Regular Rate:Normal     Neuro/Psych    GI/Hepatic negative GI ROS, Neg liver ROS,   Endo/Other  negative endocrine ROS  Renal/GU      Musculoskeletal negative musculoskeletal ROS (+)   Abdominal   Peds  Hematology negative hematology ROS (+)   Anesthesia Other Findings   Reproductive/Obstetrics                           Anesthesia Physical Anesthesia Plan  ASA: II  Anesthesia Plan: General   Post-op Pain Management:    Induction: Intravenous  Airway Management Planned: LMA  Additional Equipment:   Intra-op Plan:   Post-operative Plan: Extubation in OR  Informed Consent:   Dental advisory given  Plan Discussed with: CRNA and Surgeon  Anesthesia Plan Comments:         Anesthesia Quick Evaluation

## 2012-12-10 NOTE — Transfer of Care (Signed)
Immediate Anesthesia Transfer of Care Note  Patient: [name]  Procedure(s) Performed: Procedure(s) with comments: BREAST LUMPECTOMY WITH NEEDLE LOCALIZATION AND AXILLARY SENTINEL LYMPH NODE BX (Left) - Needle loc BCG 7:30  nuc med 9:30    Patient Location: PACU  Anesthesia Type:General  Level of Consciousness: awake, alert , oriented and patient cooperative  Airway & Oxygen Therapy: Patient Spontanous Breathing  Post-op Assessment: Report given to PACU RN, Post -op Vital signs reviewed and stable and Patient moving all extremities X 4  Post vital signs: Reviewed and stable  Complications: No apparent anesthesia complications

## 2012-12-10 NOTE — H&P (Signed)
[name]  10/22/2012 3:00 PM   Office Visit  MRN:  [id number]   Description: 37 year old female  Provider: Robyne Askew, MD  Department: [facility]        Diagnoses    Cancer of lower-inner quadrant of female breast, left    -  Primary    174.3         Current Vitals - Last Recorded    LMP              10/02/2012                 Progress Notes    Robyne Askew, MD at 10/22/2012  3:48 PM    Status: Signed                   Patient ID: [name], female   DOB: 1976-03-07, 37 y.o.   MRN: [id number]   No chief complaint on file.     HPI [name] is a 37 y.o. female.  We are asked to see the pt in consultation by Dr. Jean Rosenthal to evaluate her for left breast cancer. The pt is a 37 yo wf who recently went for her first baseline mammogram. At that time she was found to have a mass in the 8 o clock position of the left breast. 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 She denies any breast pain or discharge from the nipple. She had genetics done in [place]. She does have a grandmother with breast cancer. The mass measured 1.6cm by u/s. She was er and pr + and her2 neg. HPI    Past Medical History   Diagnosis  Date   .  Hypertension     .  Anxiety           Past Surgical History   Procedure  Laterality  Date   .  Cholecystectomy       .  Tonsillectomy       .  Cesarean section             Family History   Problem  Relation  Age of Onset   .  Breast cancer  Maternal Grandmother     .  Lung cancer  Paternal Grandmother          Social History History   Substance Use Topics   .  Smoking status:  Current Every Day Smoker -- 1.00 packs/day       Types:  Cigarettes   .  Smokeless tobacco:  Not on file   .  Alcohol Use:  Yes         Comment: 1 drink a week         Allergies   Allergen  Reactions   .  Amoxicillin         Per pt "does not work"         Current Outpatient Prescriptions   Medication   Sig  Dispense  Refill   .  ALPRAZolam (XANAX) 0.25 MG tablet  Take 0.25 mg by mouth at bedtime as needed for sleep.         .  hydrochlorothiazide (HYDRODIURIL) 25 MG tablet  Take 25 mg by mouth daily.         Marland Kitchen  lisinopril (PRINIVIL,ZESTRIL) 10 MG tablet  Take 10 mg by mouth daily.             No  current facility-administered medications for this visit.        Review of Systems Review of Systems  Constitutional: Negative.   HENT: Negative.   Eyes: Negative.   Respiratory: Negative.   Cardiovascular: Negative.   Gastrointestinal: Negative.   Endocrine: Negative.   Genitourinary: Negative.   Musculoskeletal: Negative.   Skin: Negative.   Allergic/Immunologic: Negative.   Neurological: Negative.   Hematological: Negative.   Psychiatric/Behavioral: Negative.       Last menstrual period 10/02/2012.   Physical Exam Physical Exam  Constitutional: She is oriented to person, place, and time. She appears well-developed and well-nourished.  HENT:   Head: Normocephalic and atraumatic.  Eyes: Conjunctivae and EOM are normal. Pupils are equal, round, and reactive to light.  Neck: Normal range of motion. Neck supple.  Cardiovascular: Normal rate, regular rhythm and normal heart sounds.   Pulmonary/Chest: Effort normal and breath sounds normal.  There is no palpable mass in either breast. There is no palpable axillary, supraclavicular, or cervical lymphadenopathy  Abdominal: Soft. Bowel sounds are normal. She exhibits no mass. There is no tenderness.  Musculoskeletal: Normal range of motion.  Lymphadenopathy:    She has no cervical adenopathy.  Neurological: She is alert and oriented to person, place, and time.  Skin: Skin is warm and dry.  Psychiatric: She has a normal mood and affect. Her behavior is normal.      Data Reviewed As above   Assessment    The pt appears to have a stage I left breast cancer. I have discussed with her in detail the different options for  treatment and she favors breast conservation as long as her genetic testing is negative. I think she is a good candidate for this as well as sentinel node mapping. I have discussed with her in detail the risks and benefits of surgery as well as some of the technical aspects and she understands and wishes to proceed      Plan    Plan for left breast wire localized lumpectomy and sentinel node mapping if her genetics is negative. Will refer to plastics in case genetics is positive

## 2012-12-10 NOTE — Preoperative (Signed)
Beta Blockers   Reason not to administer Beta Blockers:Not Applicable 

## 2012-12-10 NOTE — Interval H&P Note (Signed)
History and Physical Interval Note:  12/10/2012 9:13 AM  [name]  has presented today for surgery, with the diagnosis of left breast cancer   The various methods of treatment have been discussed with the patient and family. After consideration of risks, benefits and other options for treatment, the patient has consented to  Procedure(s) with comments: BREAST LUMPECTOMY WITH NEEDLE LOCALIZATION AND AXILLARY SENTINEL LYMPH NODE BX (Left) - Needle loc BCG 7:30  nuc med 9:30   as a surgical intervention .  The patient's history has been reviewed, patient examined, no change in status, stable for surgery.  I have reviewed the patient's chart and labs.  Questions were answered to the patient's satisfaction.     TOTH III,Jamier Urbas S

## 2012-12-10 NOTE — Op Note (Signed)
12/10/2012  11:24 AM  PATIENT:  [name]  37 y.o. female  PRE-OPERATIVE DIAGNOSIS:  left breast cancer   POST-OPERATIVE DIAGNOSIS:  left breast cancer   PROCEDURE:  Procedure(s) with comments: BREAST LUMPECTOMY WITH NEEDLE LOCALIZATION AND AXILLARY SENTINEL LYMPH NODE BX (Left) - Needle loc BCG 7:30  nuc med 9:30    SURGEON:  Surgeon(s) and Role:    * Robyne Askew, MD - Primary  PHYSICIAN ASSISTANT:   ASSISTANTS: none   ANESTHESIA:   general  EBL:     BLOOD ADMINISTERED:none  DRAINS: none   LOCAL MEDICATIONS USED:  MARCAINE     SPECIMEN:  Source of Specimen:  left breast tissue and left axillary sentinel node X 2  DISPOSITION OF SPECIMEN:  PATHOLOGY  COUNTS:  YES  TOURNIQUET:  * No tourniquets in log *  DICTATION: .Dragon Dictation After informed consent was obtained the patient was brought to the operating room and placed in the supine position the operating table. After adequate induction of general anesthesia the patient's left breast, chest, and axillary areas were prepped with ChloraPrep, allowed to dry, and draped in usual sterile manner. Earlier in the day the patient underwent injection of 1 mCi of technetium sulfur colloid in the subareolar position on the left. Also earlier in the day the patient underwent wire localization procedure and the wire was entering the left breast in the lower inner quadrant and headed medially. At this point, 2 cc of methyl blue and 3 cc of injectable saline were also injected in the subareolar position and the breast was massaged for several minutes. A neoprobe device was used to identify a hot spot in the left axilla. A small transverse incision was made overlying the hot spot with the 15 blade knife. This incision was carried through the skin and subcutaneous tissue sharply with electrocautery until the axilla was entered. A wheatland retractor was deployed. The neoprobe was used to direct blunt dissection in the axilla until  a hot blue lymph node was identified. This was excised sharply with the electrocautery. Some of the lymphatics were controlled with clips. Once this sentinel node was removed ex vivo counts Were approximately 3000. This was sent as sentinel node #1 and was hot and blue. A second sentinel node that was hot but not blue was also identified and excised sharply with the electrocautery. Ex vivo counts on this node were approximately 500. There were no other hot, blue, or palpable lymph nodes in the left axilla. Hemostasis was achieved using electrocautery. The deep layer of the axilla was closed with interrupted 3-0 Vicryl stitches. The skin was then closed with a running 4-0 Monocryl subcuticular stitch. Attention was then turned to the left breast. A radially oriented elliptical incision was made overlying the path of the wire. This was done with a 15 blade knife. This incision was carried through the skin and subcutaneous tissue sharply with electrocautery. Once into the breast tissue the path of the wire could be palpated. A circular portion of breast tissue was excised sharply around the path of the wire with the electrocautery. Once the specimen was removed it was painted with the assigned paint colors. A specimen radiograph was then obtained that showed the clip and wire to be in the center of the specimen. It felt as though we may be a little bit close on the anterior and medial surface so an extra portion of this part of the cavity was excised sharply with the electrocautery and this  was marked with a stitch on the new true surgical margin. All the specimens were then sent to pathology for further evaluation. Hemostasis was achieved using the electrocautery. The wound was infiltrated with quarter percent Marcaine and irrigated with copious amounts of saline. The axillary wound was also infiltrated with quarter percent Marcaine. The cavity was marked with clips. The deep layer the wound was closed with  interrupted 3-0 Vicryl stitches. The skin was then closed with interrupted 4-0 Monocryl subcuticular stitches. Dermabond dressings were applied. The patient tolerated the procedure well. At the end of the case all needle sponge and instrument counts were correct. The patient was then awakened and taken to recovery in stable condition.  PLAN OF CARE: Discharge to home after PACU  PATIENT DISPOSITION:  PACU - hemodynamically stable.   Delay start of Pharmacological VTE agent (>24hrs) due to surgical blood loss or risk of bleeding: not applicable

## 2012-12-11 ENCOUNTER — Encounter (HOSPITAL_COMMUNITY): Payer: Self-pay | Admitting: General Surgery

## 2012-12-11 ENCOUNTER — Telehealth (INDEPENDENT_AMBULATORY_CARE_PROVIDER_SITE_OTHER): Payer: Self-pay

## 2012-12-11 DIAGNOSIS — G8918 Other acute postprocedural pain: Secondary | ICD-10-CM

## 2012-12-11 MED ORDER — OXYCODONE-ACETAMINOPHEN 5-325 MG PO TABS: 1.0000 | ORAL_TABLET | ORAL | Status: DC | PRN

## 2012-12-11 NOTE — Addendum Note (Signed)
Addended by: Brennan Bailey on: 12/11/2012 01:46 PM   Modules accepted: Orders

## 2012-12-11 NOTE — Telephone Encounter (Signed)
Called pt and let her know we are going to over night her prescription for percocet by mail and she will receive it tomorrow.

## 2012-12-11 NOTE — Telephone Encounter (Signed)
Pt calling in requesting something stronger for her pain. The pt had surgery yesterday for lumpectomy by Dr Carolynne Edouard. The pt has been taking 2 Norco every 4hrs but can't get any relief to rest. The pt is requesting something stronger. The pt lives in [place] so I advised her that if something stronger gets prescribed it would be a written rx for her to p/u nothing stronger could be called in to the pharmacy. I advised pt that we would notify Dr Carolynne Edouard and Cyndra Numbers b/c she is working with him this pm.

## 2012-12-15 ENCOUNTER — Ambulatory Visit: Payer: BC Managed Care – PPO | Admitting: Oncology

## 2012-12-19 ENCOUNTER — Encounter: Payer: Self-pay | Admitting: Oncology

## 2012-12-19 ENCOUNTER — Telehealth: Payer: Self-pay | Admitting: *Deleted

## 2012-12-19 ENCOUNTER — Ambulatory Visit (HOSPITAL_BASED_OUTPATIENT_CLINIC_OR_DEPARTMENT_OTHER): Payer: BC Managed Care – PPO | Admitting: Oncology

## 2012-12-19 VITALS — BP 131/81 | HR 67 | Temp 97.7°F | Resp 20 | Ht 66.0 in | Wt 258.5 lb

## 2012-12-19 DIAGNOSIS — Z170 Estrogen receptor positive status [ER+]: Secondary | ICD-10-CM

## 2012-12-19 DIAGNOSIS — C50319 Malignant neoplasm of lower-inner quadrant of unspecified female breast: Secondary | ICD-10-CM

## 2012-12-19 DIAGNOSIS — C50312 Malignant neoplasm of lower-inner quadrant of left female breast: Secondary | ICD-10-CM

## 2012-12-19 NOTE — Patient Instructions (Addendum)
We discussed your pathology in detail today.  We discussed rationale for doing chemotherapy since her tumor is a stage II and is node positive.  We discussed using 5-FU, epirubicin, and Cytoxan to be given every 2 weeks for a total of 6 cycles. This would then be followed by Taxol every week for 12 weeks.  We did discuss also the possibility of you undergoing and axillary lymph node dissection since you had a sentinel node that was positive.

## 2012-12-19 NOTE — Telephone Encounter (Signed)
appts made and printed. Pt is awrae that i will call her w/ echo after its pre cert. i gv order to [facility]. Pt is also aware that cs will call w/ appt for PET/CT...td

## 2012-12-24 ENCOUNTER — Ambulatory Visit: Payer: BC Managed Care – PPO | Admitting: Oncology

## 2012-12-30 ENCOUNTER — Encounter: Payer: Self-pay | Admitting: *Deleted

## 2012-12-30 ENCOUNTER — Ambulatory Visit (INDEPENDENT_AMBULATORY_CARE_PROVIDER_SITE_OTHER): Payer: BC Managed Care – PPO | Admitting: General Surgery

## 2012-12-30 ENCOUNTER — Encounter (INDEPENDENT_AMBULATORY_CARE_PROVIDER_SITE_OTHER): Payer: Self-pay | Admitting: General Surgery

## 2012-12-30 ENCOUNTER — Ambulatory Visit: Payer: BC Managed Care – PPO | Admitting: Oncology

## 2012-12-30 VITALS — BP 114/74 | HR 84 | Ht 68.0 in | Wt 252.6 lb

## 2012-12-30 DIAGNOSIS — C50319 Malignant neoplasm of lower-inner quadrant of unspecified female breast: Secondary | ICD-10-CM

## 2012-12-30 DIAGNOSIS — C50312 Malignant neoplasm of lower-inner quadrant of left female breast: Secondary | ICD-10-CM

## 2012-12-30 NOTE — Patient Instructions (Signed)
Plan for port placement

## 2012-12-30 NOTE — Progress Notes (Signed)
No oncotype ordered per Dr. Welton Flakes.

## 2012-12-31 ENCOUNTER — Telehealth: Payer: Self-pay | Admitting: *Deleted

## 2012-12-31 NOTE — Telephone Encounter (Signed)
Printed the echo order and gv to [facility] for pre cert...td

## 2013-01-01 ENCOUNTER — Telehealth: Payer: Self-pay | Admitting: *Deleted

## 2013-01-01 NOTE — Telephone Encounter (Signed)
Lm gv appt for echo for 01/16/13@ 1pm...td

## 2013-01-01 NOTE — Telephone Encounter (Signed)
Left message on pt's voicemail that PET scan was cancelled due to insurance not covering it per radiology.  Pt informed that CT scan is still scheduled for 8:45.  Advised to call back with any questions.

## 2013-01-02 ENCOUNTER — Telehealth: Payer: Self-pay | Admitting: Oncology

## 2013-01-02 ENCOUNTER — Encounter (INDEPENDENT_AMBULATORY_CARE_PROVIDER_SITE_OTHER): Payer: BC Managed Care – PPO | Admitting: General Surgery

## 2013-01-02 ENCOUNTER — Encounter (HOSPITAL_COMMUNITY): Payer: BC Managed Care – PPO

## 2013-01-02 ENCOUNTER — Encounter (HOSPITAL_BASED_OUTPATIENT_CLINIC_OR_DEPARTMENT_OTHER): Payer: Self-pay | Admitting: *Deleted

## 2013-01-02 ENCOUNTER — Ambulatory Visit (HOSPITAL_COMMUNITY)
Admission: RE | Admit: 2013-01-02 | Discharge: 2013-01-02 | Disposition: A | Discharge status: Home / Self Care | Payer: BC Managed Care – PPO | Source: Ambulatory Visit | Attending: Oncology | Admitting: Oncology

## 2013-01-02 ENCOUNTER — Encounter: Payer: Self-pay | Admitting: Oncology

## 2013-01-02 ENCOUNTER — Other Ambulatory Visit: Payer: Self-pay | Admitting: Oncology

## 2013-01-02 ENCOUNTER — Other Ambulatory Visit (HOSPITAL_COMMUNITY): Payer: BC Managed Care – PPO

## 2013-01-02 DIAGNOSIS — K7689 Other specified diseases of liver: Secondary | ICD-10-CM | POA: Insufficient documentation

## 2013-01-02 DIAGNOSIS — E079 Disorder of thyroid, unspecified: Secondary | ICD-10-CM | POA: Insufficient documentation

## 2013-01-02 DIAGNOSIS — N200 Calculus of kidney: Secondary | ICD-10-CM | POA: Insufficient documentation

## 2013-01-02 DIAGNOSIS — R918 Other nonspecific abnormal finding of lung field: Secondary | ICD-10-CM | POA: Insufficient documentation

## 2013-01-02 DIAGNOSIS — C50312 Malignant neoplasm of lower-inner quadrant of left female breast: Secondary | ICD-10-CM

## 2013-01-02 DIAGNOSIS — C50919 Malignant neoplasm of unspecified site of unspecified female breast: Secondary | ICD-10-CM | POA: Insufficient documentation

## 2013-01-02 MED ORDER — IOHEXOL 300 MG/ML  SOLN
100.0000 mL | Freq: Once | INTRAMUSCULAR | Status: AC | PRN
  Administered 2013-01-02: 100 mL via INTRAVENOUS

## 2013-01-02 NOTE — Progress Notes (Signed)
Put fmla form on nurse's desk °

## 2013-01-05 NOTE — Progress Notes (Signed)
OFFICE PROGRESS NOTE  CC  [name] [address] [place] [address] Dr. Chevis Pretty  Dr. 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 Patient was seen in the multidisciplinary breast clinic for discussion of treatment options.   STAGE:  Cancer of lower-inner quadrant of female breast  Primary site: Breast (Left)  Staging method: AJCC 7th Edition  Clinical: Stage IA (T1c, N0, cM0)  Summary: Stage IA (T1c, N0, cM0)   PRIOR THERAPY: #1Patient had a screening mammogram in [place] on 09/23/2012 she was felt to have a new mass within the left breast. She had ultrasound on 10/06/2012 which showed a 1.6 x 1.3 x 1.3 cm mass at the 8:00 position 7 cm from the nipple. At the 12:00 position behind and nipple was circumscribed oval shaped 2 x 1.8 x 1.2 cm lesion perhaps representing a fibroadenoma. Ultrasound-guided biopsies were performed on 10/06/2012 that revealed fibrocystic changes at the 12:00 and invasive ductal carcinoma at the 8:00 position. The carcinoma was grade 2-3 with a Ki-67 25% HER-2/neu negative. ER positive PR positive at 80%. Her pathology from [facility] was reviewed by Dr. Colonel Bald. Patient did have blood work performed for genetic testing in [place] day prior to her visit today. However she has not been seen formally by genetic counselor. Patient had MRIs of the breasts performed   #2 patient is now status post lumpectomy with sentinel lymph node biopsy with the final pathology revealing a 2.3 cm invasive ductal carcinoma, grade 3 one sentinel node had microscopic disease. Tumor was ER positive PR positive with pleural effusion marker Ki-67 25%. Her final pathologic staging is T2 N1 microscopic.  #3 patient did have genetic testing performed in [place] and reportedly it is negative for BRCA1 and BRCA2  CURRENT THERAPY:  INTERVAL HISTORY: [name] 37 y.o. female  returns for followup visit after her lumpectomy. Overall she's done well she has recovered quite nicely without any problems. She denies any fevers chills night sweats headaches shortness of breath chest pains palpitations she does have a little bit of tenderness in breast otherwise no other problems remainder of the 10 point review of systems is negative.  MEDICAL HISTORY: Past Medical History  Diagnosis Date  . Urinary incontinence     occasional  . Family history of anesthesia complication     pt's mother has hx. of post-op N/V  . Hypertension     under control with med., has been on med. since 09/2012  . Anxiety   . Breast cancer     left    ALLERGIES:  is allergic to amoxicillin.  MEDICATIONS:  Current Outpatient Prescriptions  Medication Sig Dispense Refill  . ALPRAZolam (XANAX) 0.25 MG tablet Take 0.25 mg by mouth at bedtime as needed for sleep.      Marland Kitchen HYDROcodone-acetaminophen (NORCO/VICODIN) 5-325 MG per tablet Take 1-2 tablets by mouth every 4 (four) hours as needed for pain.  50 tablet  1  . lisinopril (PRINIVIL,ZESTRIL) 10 MG tablet Take 10 mg by mouth daily.      Marland Kitchen oxyCODONE-acetaminophen (ROXICET) 5-325 MG per tablet Take 1 tablet by mouth every 4 (four) hours as needed for pain.  50 tablet  0   No current facility-administered medications for this visit.    SURGICAL HISTORY:  Past Surgical History  Procedure Laterality Date  . Cesarean section  1999  . Breast lumpectomy with needle localization and axillary sentinel  lymph node bx Left 12/10/2012    Procedure: BREAST LUMPECTOMY WITH NEEDLE LOCALIZATION AND AXILLARY SENTINEL LYMPH NODE BX;  Surgeon: Robyne Askew, MD;  Location: [facility] OR;  Service: General;  Laterality: Left;  Needle loc BCG 7:30  nuc med 9:30    . Tonsillectomy and adenoidectomy      as a teenager  . Cholecystectomy  1999  . Open reduction internal fixation (orif) tibia/fibula fracture Right     REVIEW OF SYSTEMS:  A comprehensive review of  systems was negative.   HEALTH MAINTENANCE:   PHYSICAL EXAMINATION: Blood pressure 131/81, pulse 67, temperature 97.7 F (36.5 C), temperature source Oral, resp. rate 20, height 5\' 6"  (1.676 m), weight 258 lb 8 oz (117.255 kg), last menstrual period 11/30/2012. Body mass index is 41.74 kg/(m^2). ECOG PERFORMANCE STATUS: 0 - Asymptomatic   General appearance: alert, cooperative and appears stated age Lymph nodes: Cervical, supraclavicular, and axillary nodes normal. Resp: clear to auscultation bilaterally Cardio: regular rate and rhythm GI: soft, non-tender; bowel sounds normal; no masses,  no organomegaly Extremities: extremities normal, atraumatic, no cyanosis or edema Neurologic: Grossly normal   LABORATORY DATA: Lab Results  Component Value Date   WBC 10.1 12/03/2012   HGB 13.2 12/03/2012   HCT 38.1 12/03/2012   MCV 91.6 12/03/2012   PLT 248 12/03/2012      Chemistry      Component Value Date/Time   NA 138 12/03/2012 1409   NA 137 10/22/2012 1217   K 3.6 12/03/2012 1409   K 3.5 10/22/2012 1217   CL 103 12/03/2012 1409   CO2 26 12/03/2012 1409   CO2 25 10/22/2012 1217   BUN 13 12/03/2012 1409   BUN 12.7 10/22/2012 1217   CREATININE 0.77 12/03/2012 1409   CREATININE 0.8 10/22/2012 1217      Component Value Date/Time   CALCIUM 8.8 12/03/2012 1409   CALCIUM 9.6 10/22/2012 1217   ALKPHOS 74 10/22/2012 1217   AST 18 10/22/2012 1217   ALT 20 10/22/2012 1217   BILITOT 0.69 10/22/2012 1217     ADDITIONAL INFORMATION: 3. CHROMOGENIC IN-SITU HYBRIDIZATION Results: HER-2/NEU BY [facility] - NO AMPLIFICATION OF HER-2 DETECTED. RESULT RATIO OF HER2: CEP 17 SIGNALS 1.75 AVERAGE HER2 COPY NUMBER PER CELL 2.45 REFERENCE RANGE NEGATIVE HER2/Chr17 Ratio <2.0 and Average HER2 copy number <4.0 EQUIVOCAL HER2/Chr17 Ratio <2.0 and Average HER2 copy number 4.0 and <6.0 POSITIVE HER2/Chr17 Ratio >=2.0 and/or Average HER2 copy number >=6.0 Abigail Miyamoto MD Pathologist, Electronic Signature ( Signed  12/17/2012) FINAL DIAGNOSIS Diagnosis 1. Lymph node, sentinel, biopsy, left - ONE LYMPH NODE POSITIVE FOR MICROMETASTATIC DUCTAL CARCINOMA (1/1). 2. Lymph node, sentinel, biopsy, left - ONE BENIGN LYMPH NODE WITH NO TUMOR SEEN (0/1). 3. Breast, lumpectomy, left - INVASIVE GRADE III DUCTAL CARCINOMA SPANNING 2.3 CM IN GREATEST DIMENSION. - ASSOCIATED INTERMEDIATE GRADE DUCTAL CARCINOMA IN SITU WITH NECROSIS. - LYMPH/VASCULAR INVASION IS IDENTIFIED. - MARGINS ARE NEGATIVE. 1 of 3 FINAL for JAHNIYA, DUZAN ([id number]) Diagnosis(continued) - SEE ONCOLOGY TEMPLATE 4. Breast, excision, left - BENIGN BREAST PARENCHYMA. - NO TUMOR SEEN. Microscopic Comment 3. BREAST, INVASIVE TUMOR, WITH LYMPH NODE SAMPLING Specimen, including laterality and lymph node sampling (sentinel, non-sentinel): Left partial breast with additional left excision and sentinel lymph node sampling. Procedure: Left breast lumpectomy with additional left breast excision and sentinel lymph node biopsy. Histologic type: Ductal carcinoma. Grade: III. Tubule formation: 3. Nuclear pleomorphism: 2. Mitotic: 3. Tumor size (gross measurement): 2.3 cm. Margins: Invasive, distance to closest margin: 0.4  cm (superior margin). In-situ, distance to closest margin: At least 0.4 cm (superior margin). Lymphovascular invasion: Identified. Ductal carcinoma in situ: Yes. Grade: Intermediate grade. Extensive intraductal component: No. Lobular neoplasia: No. Tumor focality: Unifocal. Treatment effect: N/A. Extent of tumor: Tumor confined to breast. Skin: Not involved. Nipple: Not received. Skeletal muscle: Not received. Lymph nodes: Examined: 2 Sentinel. 0 Non-sentinel. 2 Total. Lymph nodes with metastasis: 1. Micrometastasis: (> 0.2 mm and < 2.0 mm): 1. Extracapsular extension: Not identified. Breast prognostic profile: Reported on previous case per outside slides ([id number]). Estrogen receptor: Approximately  80%. Progesterone receptor: Approximately 80%. Her 2 neu: Not amplified. Ki-67: 25%. Non-neoplastic breast: Fibrocystic changes, benign skin present. TNM: pT2, pN63mi, MX. Comments: As Her-2 neu by [facility] was previously negative (not amplified), it will be repeated on the invasive tumor and reported in an addendum.  RADIOGRAPHIC STUDIES:  Ct Chest W Contrast  01/02/2013   CLINICAL DATA:  Breast cancer. Evaluate for metastatic disease.  EXAM: CT CHEST, ABDOMEN, AND PELVIS WITH CONTRAST  TECHNIQUE: Multidetector CT imaging of the chest, abdomen and pelvis was performed following the standard protocol during bolus administration of intravenous contrast.  CONTRAST:  OMNIPAQUE IOHEXOL 300 MG/ML  SOLN  COMPARISON:  Breast MR 10/22/2012.  FINDINGS: CT CHEST FINDINGS  9 mm low-attenuation lesion in the left lobe of the thyroid. No pathologically enlarged mediastinal, hilar, right axillary or internal mammary lymph nodes. Postoperative changes and a small fluid collection, measuring 2.1 x 2.5 cm, are seen in the left axilla. Largest left axillary lymph node measures 10 mm (image 18). Postoperative changes are seen in the left breast, including a 3.2 x 6.3 cm fluid collection medially. Heart is at the upper limits of normal in size. No pericardial effusion.  There are a few scattered tiny pulmonary nodular densities, measuring up to 3 mm in the left upper lobe (image 20). No pleural fluid. Airway is unremarkable.  CT ABDOMEN AND PELVIS FINDINGS  Liver is decreased in attenuation diffusely with two hyper attenuating lesions in the dome of the liver, measuring up to 2.1 cm (image 44). Cholecystectomy. Adrenal glands and right kidney are unremarkable. Tiny stone in the lower pole left kidney. Low-attenuation lesions in the left kidney measure up to 11 mm, and are likely cysts. Spleen, pancreas, stomach and colon are unremarkable.  Uterus and ovaries are visualized. There may be small fibroids within the uterus,  creating small contour abnormalities. No pathologically enlarged lymph nodes. No free fluid. No worrisome lytic or sclerotic lesions.  IMPRESSION: 1. Postoperative changes in the left breast and left axilla without evidence of intrathoracic metastatic disease. 2. Hepatic steatosis with two hyper attenuating lesions in the dome of the liver. While these may represent hemangiomas, metastatic disease cannot be definitively excluded. MR abdomen without and with contrast could be performed in further evaluation, as clinically indicated. 3. Few scattered tiny pulmonary nodular densities, nonspecific. While a benign etiology is favored, continued attention on followup exams is warranted. 4. Tiny left renal stone.   Electronically Signed   By: Leanna Battles M.D.   On: 01/02/2013 09:32   Ct Abdomen Pelvis W Contrast  01/02/2013   CLINICAL DATA:  Breast cancer. Evaluate for metastatic disease.  EXAM: CT CHEST, ABDOMEN, AND PELVIS WITH CONTRAST  TECHNIQUE: Multidetector CT imaging of the chest, abdomen and pelvis was performed following the standard protocol during bolus administration of intravenous contrast.  CONTRAST:  OMNIPAQUE IOHEXOL 300 MG/ML  SOLN  COMPARISON:  Breast MR 10/22/2012.  FINDINGS:  CT CHEST FINDINGS  9 mm low-attenuation lesion in the left lobe of the thyroid. No pathologically enlarged mediastinal, hilar, right axillary or internal mammary lymph nodes. Postoperative changes and a small fluid collection, measuring 2.1 x 2.5 cm, are seen in the left axilla. Largest left axillary lymph node measures 10 mm (image 18). Postoperative changes are seen in the left breast, including a 3.2 x 6.3 cm fluid collection medially. Heart is at the upper limits of normal in size. No pericardial effusion.  There are a few scattered tiny pulmonary nodular densities, measuring up to 3 mm in the left upper lobe (image 20). No pleural fluid. Airway is unremarkable.  CT ABDOMEN AND PELVIS FINDINGS  Liver is decreased  in attenuation diffusely with two hyper attenuating lesions in the dome of the liver, measuring up to 2.1 cm (image 44). Cholecystectomy. Adrenal glands and right kidney are unremarkable. Tiny stone in the lower pole left kidney. Low-attenuation lesions in the left kidney measure up to 11 mm, and are likely cysts. Spleen, pancreas, stomach and colon are unremarkable.  Uterus and ovaries are visualized. There may be small fibroids within the uterus, creating small contour abnormalities. No pathologically enlarged lymph nodes. No free fluid. No worrisome lytic or sclerotic lesions.  IMPRESSION: 1. Postoperative changes in the left breast and left axilla without evidence of intrathoracic metastatic disease. 2. Hepatic steatosis with two hyper attenuating lesions in the dome of the liver. While these may represent hemangiomas, metastatic disease cannot be definitively excluded. MR abdomen without and with contrast could be performed in further evaluation, as clinically indicated. 3. Few scattered tiny pulmonary nodular densities, nonspecific. While a benign etiology is favored, continued attention on followup exams is warranted. 4. Tiny left renal stone.   Electronically Signed   By: Leanna Battles M.D.   On: 01/02/2013 09:32   Nm Sentinel Node Inj-no Rpt (breast)  12/10/2012   CLINICAL DATA: left breast cancer   Sulfur colloid was injected intradermally by the nuclear medicine  technologist for breast cancer sentinel node localization.    [employer] [facility] Breast Loc Dev   1st Lesion  Inc US Guide  12/10/2012   *RADIOLOGY REPORT*  Clinical Data:  Known left breast carcinoma.  Preoperative localization.  NEEDLE LOCALIZATION USING ULTRASOUND GUIDANCE AND SPECIMEN RADIOGRAPH  Comparison: Previous exams.  Patient presents for needle localization prior to surgical excision of the left breast mass.  I met with the patient and we discussed the procedure of needle localization including benefits and alternatives. We discussed  the high likelihood of a successful procedure. We discussed the risks of the procedure, including infection, bleeding, tissue injury, and further surgery. Informed, written consent was given. The usual time-out protocol was performed immediately prior to the procedure.  Using ultrasound guidance, sterile technique, 2% lidocaine and a 7 cm Ultra wire  the left breast mass was localized using an inferior/medial approach.  The films are marked for Dr. Carolynne Edouard.  Specimen radiograph is performed at surgery, and confirms the mass, clip, and intact wire to be present in the tissue sample.  The specimen is marked for pathology.  IMPRESSION: Needle localization of the left breast as discussed above.  No apparent complications.   Original Report Authenticated By: Rolla Plate, M.D.    ASSESSMENT: 37 year old female with  #1 stage II invasive ductal carcinoma, grade 3 ER positive PR positive HER-2/neu negative with an elevated Ki-67. Patient is status post lumpectomy of the left breast with 1 of 2 sentinel nodes revealing  microscopic disease. Question really arises whether she should undergo lymph node dissection or have radiation therapy without or dissection. I will put this to my colleagues.  #2 patient certainly will need chemotherapy. We discussed 5-FU epirubicin Cytoxan to be given every 2 weeks for a total of 6 cycles followed by Taxol on a weekly basis for 12 weeks. Goal is to get this started as soon as possible. After completion of chemotherapy she will go on to radiation.   PLAN:   #1 patient will proceed with chemotherapy education and echocardiogram.  #2 we will plan on getting her started on chemotherapy towards the end of October.   All questions were answered. The patient knows to call the clinic with any problems, questions or concerns. We can certainly see the patient much sooner if necessary.  I spent 25 minutes counseling the patient face to face. The total time spent in the appointment  was 25 minutes.    Drue Second, MD Medical/Oncology [facility] [phone] (beeper) [phone] (Office)

## 2013-01-07 ENCOUNTER — Other Ambulatory Visit (INDEPENDENT_AMBULATORY_CARE_PROVIDER_SITE_OTHER): Payer: Self-pay | Admitting: General Surgery

## 2013-01-08 ENCOUNTER — Encounter: Payer: Self-pay | Admitting: Oncology

## 2013-01-08 NOTE — Progress Notes (Signed)
Put fmla form in registration desk °

## 2013-01-09 ENCOUNTER — Encounter: Payer: Self-pay | Admitting: *Deleted

## 2013-01-09 ENCOUNTER — Encounter (HOSPITAL_BASED_OUTPATIENT_CLINIC_OR_DEPARTMENT_OTHER)
Admission: RE | Disposition: A | Discharge status: Home / Self Care | Payer: Self-pay | Source: Ambulatory Visit | Attending: General Surgery

## 2013-01-09 ENCOUNTER — Encounter (HOSPITAL_BASED_OUTPATIENT_CLINIC_OR_DEPARTMENT_OTHER): Payer: Self-pay | Admitting: *Deleted

## 2013-01-09 ENCOUNTER — Ambulatory Visit (HOSPITAL_BASED_OUTPATIENT_CLINIC_OR_DEPARTMENT_OTHER)
Admission: RE | Admit: 2013-01-09 | Discharge: 2013-01-09 | Disposition: A | Discharge status: Home / Self Care | Payer: BC Managed Care – PPO | Source: Ambulatory Visit | Attending: General Surgery | Admitting: General Surgery

## 2013-01-09 ENCOUNTER — Encounter (HOSPITAL_BASED_OUTPATIENT_CLINIC_OR_DEPARTMENT_OTHER): Payer: BC Managed Care – PPO | Admitting: Anesthesiology

## 2013-01-09 ENCOUNTER — Ambulatory Visit (HOSPITAL_COMMUNITY): Payer: BC Managed Care – PPO

## 2013-01-09 ENCOUNTER — Ambulatory Visit (HOSPITAL_BASED_OUTPATIENT_CLINIC_OR_DEPARTMENT_OTHER): Payer: BC Managed Care – PPO | Admitting: Anesthesiology

## 2013-01-09 DIAGNOSIS — C50319 Malignant neoplasm of lower-inner quadrant of unspecified female breast: Secondary | ICD-10-CM | POA: Insufficient documentation

## 2013-01-09 DIAGNOSIS — F17200 Nicotine dependence, unspecified, uncomplicated: Secondary | ICD-10-CM | POA: Insufficient documentation

## 2013-01-09 DIAGNOSIS — C50312 Malignant neoplasm of lower-inner quadrant of left female breast: Secondary | ICD-10-CM

## 2013-01-09 DIAGNOSIS — D0590 Unspecified type of carcinoma in situ of unspecified breast: Secondary | ICD-10-CM

## 2013-01-09 DIAGNOSIS — I10 Essential (primary) hypertension: Secondary | ICD-10-CM | POA: Insufficient documentation

## 2013-01-09 DIAGNOSIS — F411 Generalized anxiety disorder: Secondary | ICD-10-CM | POA: Insufficient documentation

## 2013-01-09 DIAGNOSIS — C50919 Malignant neoplasm of unspecified site of unspecified female breast: Secondary | ICD-10-CM

## 2013-01-09 HISTORY — DX: Malignant neoplasm of unspecified site of unspecified female breast: C50.919

## 2013-01-09 HISTORY — PX: PORTACATH PLACEMENT: SHX2246

## 2013-01-09 SURGERY — INSERTION, TUNNELED CENTRAL VENOUS DEVICE, WITH PORT
Anesthesia: General | Site: Neck | Laterality: Right | Wound class: Clean

## 2013-01-09 MED ORDER — MIDAZOLAM HCL 2 MG/2ML IJ SOLN
INTRAMUSCULAR | Status: AC
  Filled 2013-01-09: qty 2

## 2013-01-09 MED ORDER — FENTANYL CITRATE 0.05 MG/ML IJ SOLN
INTRAMUSCULAR | Status: DC | PRN
  Administered 2013-01-09: 100 ug via INTRAVENOUS

## 2013-01-09 MED ORDER — DEXAMETHASONE SODIUM PHOSPHATE 4 MG/ML IJ SOLN
INTRAMUSCULAR | Status: DC | PRN
  Administered 2013-01-09: 10 mg via INTRAVENOUS

## 2013-01-09 MED ORDER — PROPOFOL 10 MG/ML IV BOLUS
INTRAVENOUS | Status: DC | PRN
  Administered 2013-01-09: 200 mg via INTRAVENOUS

## 2013-01-09 MED ORDER — HEPARIN SOD (PORK) LOCK FLUSH 100 UNIT/ML IV SOLN
INTRAVENOUS | Status: DC | PRN
  Administered 2013-01-09: 500 [IU] via INTRAVENOUS

## 2013-01-09 MED ORDER — HEPARIN (PORCINE) IN NACL 2-0.9 UNIT/ML-% IJ SOLN
INTRAMUSCULAR | Status: DC | PRN
  Administered 2013-01-09: 1 via INTRAVENOUS

## 2013-01-09 MED ORDER — HEPARIN SOD (PORK) LOCK FLUSH 100 UNIT/ML IV SOLN
INTRAVENOUS | Status: AC
  Filled 2013-01-09: qty 5

## 2013-01-09 MED ORDER — FENTANYL CITRATE 0.05 MG/ML IJ SOLN
INTRAMUSCULAR | Status: AC
  Filled 2013-01-09: qty 4

## 2013-01-09 MED ORDER — BUPIVACAINE HCL (PF) 0.25 % IJ SOLN
INTRAMUSCULAR | Status: DC | PRN
  Administered 2013-01-09: 10 mL

## 2013-01-09 MED ORDER — HYDROMORPHONE HCL PF 1 MG/ML IJ SOLN
0.2500 mg | INTRAMUSCULAR | Status: DC | PRN
  Administered 2013-01-09 (×4): 0.5 mg via INTRAVENOUS

## 2013-01-09 MED ORDER — CHLORHEXIDINE GLUCONATE 4 % EX LIQD: 1.0000 "application " | Freq: Once | CUTANEOUS | Status: DC

## 2013-01-09 MED ORDER — MIDAZOLAM HCL 2 MG/ML PO SYRP: 12.0000 mg | ORAL_SOLUTION | Freq: Once | ORAL | Status: DC | PRN

## 2013-01-09 MED ORDER — BUPIVACAINE HCL (PF) 0.25 % IJ SOLN
INTRAMUSCULAR | Status: AC
  Filled 2013-01-09: qty 30

## 2013-01-09 MED ORDER — MIDAZOLAM HCL 2 MG/2ML IJ SOLN: 1.0000 mg | INTRAMUSCULAR | Status: DC | PRN

## 2013-01-09 MED ORDER — HYDROMORPHONE HCL PF 1 MG/ML IJ SOLN
INTRAMUSCULAR | Status: AC
  Filled 2013-01-09: qty 1

## 2013-01-09 MED ORDER — FENTANYL CITRATE 0.05 MG/ML IJ SOLN: 50.0000 ug | INTRAMUSCULAR | Status: DC | PRN

## 2013-01-09 MED ORDER — VANCOMYCIN HCL IN DEXTROSE 1-5 GM/200ML-% IV SOLN
1000.0000 mg | INTRAVENOUS | Status: AC
  Administered 2013-01-09: 1000 mg via INTRAVENOUS

## 2013-01-09 MED ORDER — HEPARIN (PORCINE) IN NACL 2-0.9 UNIT/ML-% IJ SOLN
INTRAMUSCULAR | Status: AC
  Filled 2013-01-09: qty 500

## 2013-01-09 MED ORDER — VANCOMYCIN HCL IN DEXTROSE 500-5 MG/100ML-% IV SOLN
INTRAVENOUS | Status: AC
  Filled 2013-01-09: qty 200

## 2013-01-09 MED ORDER — LIDOCAINE HCL (CARDIAC) 20 MG/ML IV SOLN
INTRAVENOUS | Status: DC | PRN
  Administered 2013-01-09: 50 mg via INTRAVENOUS

## 2013-01-09 MED ORDER — MIDAZOLAM HCL 5 MG/5ML IJ SOLN
INTRAMUSCULAR | Status: DC | PRN
  Administered 2013-01-09: 2 mg via INTRAVENOUS

## 2013-01-09 MED ORDER — HYDROCODONE-ACETAMINOPHEN 5-325 MG PO TABS: 1.0000 | ORAL_TABLET | ORAL | Status: DC | PRN

## 2013-01-09 MED ORDER — LACTATED RINGERS IV SOLN
INTRAVENOUS | Status: DC
  Administered 2013-01-09: 13:00:00 via INTRAVENOUS

## 2013-01-09 MED ORDER — HEPARIN SODIUM (PORCINE) 1000 UNIT/ML IJ SOLN
INTRAMUSCULAR | Status: AC
  Filled 2013-01-09: qty 1

## 2013-01-09 MED ORDER — ONDANSETRON HCL 4 MG/2ML IJ SOLN
INTRAMUSCULAR | Status: DC | PRN
  Administered 2013-01-09: 4 mg via INTRAVENOUS

## 2013-01-09 MED ORDER — HYDROCODONE-ACETAMINOPHEN 5-325 MG PO TABS
ORAL_TABLET | ORAL | Status: AC
  Filled 2013-01-09: qty 1

## 2013-01-09 MED ORDER — HYDROCODONE-ACETAMINOPHEN 5-325 MG PO TABS
1.0000 | ORAL_TABLET | Freq: Once | ORAL | Status: AC | PRN
  Administered 2013-01-09: 1 via ORAL

## 2013-01-09 SURGICAL SUPPLY — 46 items
BAG DECANTER FOR FLEXI CONT (MISCELLANEOUS) ×2 IMPLANT
BLADE SURG 15 STRL LF DISP TIS (BLADE) ×1 IMPLANT
BLADE SURG 15 STRL SS (BLADE) ×1
CANISTER SUCT 1200ML W/VALVE (MISCELLANEOUS) IMPLANT
CHLORAPREP W/TINT 26ML (MISCELLANEOUS) ×4 IMPLANT
CLEANER CAUTERY TIP 5X5 PAD (MISCELLANEOUS) ×1 IMPLANT
COVER MAYO STAND STRL (DRAPES) ×2 IMPLANT
COVER TABLE BACK 60X90 (DRAPES) ×2 IMPLANT
DECANTER SPIKE VIAL GLASS SM (MISCELLANEOUS) IMPLANT
DERMABOND ADVANCED (GAUZE/BANDAGES/DRESSINGS) ×1
DERMABOND ADVANCED .7 DNX12 (GAUZE/BANDAGES/DRESSINGS) ×1 IMPLANT
DRAPE C-ARM 42X72 X-RAY (DRAPES) ×2 IMPLANT
DRAPE LAPAROSCOPIC ABDOMINAL (DRAPES) ×2 IMPLANT
DRAPE UTILITY XL STRL (DRAPES) ×2 IMPLANT
ELECT REM PT RETURN 9FT ADLT (ELECTROSURGICAL) ×2
ELECTRODE REM PT RTRN 9FT ADLT (ELECTROSURGICAL) ×1 IMPLANT
GLOVE BIO SURGEON STRL SZ7.5 (GLOVE) ×2 IMPLANT
GLOVE BIOGEL PI IND STRL 7.0 (GLOVE) ×1 IMPLANT
GLOVE BIOGEL PI INDICATOR 7.0 (GLOVE) ×1
GLOVE ECLIPSE 6.5 STRL STRAW (GLOVE) ×2 IMPLANT
GOWN PREVENTION PLUS XLARGE (GOWN DISPOSABLE) ×4 IMPLANT
IV KIT MINILOC 20X1 SAFETY (NEEDLE) IMPLANT
KIT BARDPORT ISP (Port) IMPLANT
KIT PORT POWER 8FR ISP CVUE (Catheter) ×2 IMPLANT
NDL SAFETY ECLIPSE 18X1.5 (NEEDLE) IMPLANT
NEEDLE HYPO 18GX1.5 SHARP (NEEDLE)
NEEDLE HYPO 22GX1.5 SAFETY (NEEDLE) ×2 IMPLANT
NEEDLE HYPO 25X1 1.5 SAFETY (NEEDLE) ×2 IMPLANT
NEEDLE SPNL 22GX3.5 QUINCKE BK (NEEDLE) IMPLANT
PACK BASIN DAY SURGERY FS (CUSTOM PROCEDURE TRAY) ×2 IMPLANT
PAD CLEANER CAUTERY TIP 5X5 (MISCELLANEOUS) ×1
PENCIL BUTTON HOLSTER BLD 10FT (ELECTRODE) ×2 IMPLANT
SLEEVE SCD COMPRESS KNEE MED (MISCELLANEOUS) ×2 IMPLANT
SUT MON AB 4-0 PC3 18 (SUTURE) ×2 IMPLANT
SUT PROLENE 2 0 SH DA (SUTURE) ×2 IMPLANT
SUT SILK 2 0 TIES 17X18 (SUTURE)
SUT SILK 2-0 18XBRD TIE BLK (SUTURE) IMPLANT
SUT VIC AB 3-0 SH 27 (SUTURE) ×1
SUT VIC AB 3-0 SH 27X BRD (SUTURE) ×1 IMPLANT
SYR 5ML LL (SYRINGE) ×2 IMPLANT
SYR 5ML LUER SLIP (SYRINGE) ×2 IMPLANT
SYR CONTROL 10ML LL (SYRINGE) ×4 IMPLANT
TOWEL OR 17X24 6PK STRL BLUE (TOWEL DISPOSABLE) ×4 IMPLANT
TOWEL OR NON WOVEN STRL DISP B (DISPOSABLE) ×2 IMPLANT
TUBE CONNECTING 20X1/4 (TUBING) IMPLANT
YANKAUER SUCT BULB TIP NO VENT (SUCTIONS) IMPLANT

## 2013-01-09 NOTE — Transfer of Care (Signed)
Immediate Anesthesia Transfer of Care Note  Patient: [name]  Procedure(s) Performed: Procedure(s): INSERTION PORT-A-CATH (Right)  Patient Location: PACU  Anesthesia Type:General  Level of Consciousness: awake and alert   Airway & Oxygen Therapy: Patient Spontanous Breathing and Patient connected to face mask oxygen  Post-op Assessment: Report given to PACU RN and Post -op Vital signs reviewed and stable  Post vital signs: Reviewed and stable  Complications: No apparent anesthesia complications

## 2013-01-09 NOTE — Interval H&P Note (Signed)
History and Physical Interval Note:  01/09/2013 12:48 PM  [name]  has presented today for surgery, with the diagnosis of breast cancer  The various methods of treatment have been discussed with the patient and family. After consideration of risks, benefits and other options for treatment, the patient has consented to  Procedure(s): INSERTION PORT-A-CATH (N/A) as a surgical intervention .  The patient's history has been reviewed, patient examined, no change in status, stable for surgery.  I have reviewed the patient's chart and labs.  Questions were answered to the patient's satisfaction.     TOTH III,PAUL S

## 2013-01-09 NOTE — Anesthesia Preprocedure Evaluation (Signed)
Anesthesia Evaluation  Patient identified by MRN, date of birth, ID band Patient awake    Reviewed: Allergy & Precautions, H&P , NPO status , Patient's Chart, lab work & pertinent test results  Airway Mallampati: II      Dental   Pulmonary neg pulmonary ROS,  breath sounds clear to auscultation        Cardiovascular hypertension, Rhythm:Regular Rate:Normal     Neuro/Psych    GI/Hepatic negative GI ROS, Neg liver ROS,   Endo/Other  negative endocrine ROS  Renal/GU negative Renal ROS     Musculoskeletal   Abdominal   Peds  Hematology   Anesthesia Other Findings   Reproductive/Obstetrics                           Anesthesia Physical Anesthesia Plan  ASA: III  Anesthesia Plan: General   Post-op Pain Management:    Induction: Intravenous  Airway Management Planned: LMA  Additional Equipment:   Intra-op Plan:   Post-operative Plan:   Informed Consent:   Dental advisory given  Plan Discussed with: CRNA and Anesthesiologist  Anesthesia Plan Comments:         Anesthesia Quick Evaluation

## 2013-01-09 NOTE — Op Note (Signed)
01/09/2013  2:19 PM  PATIENT:  [name]  37 y.o. female  PRE-OPERATIVE DIAGNOSIS:  breast cancer  POST-OPERATIVE DIAGNOSIS:  Breast Cancer  PROCEDURE:  Procedure(s): INSERTION PORT-A-CATH (Right)  SURGEON:  Surgeon(s) and Role:    * Robyne Askew, MD - Primary  PHYSICIAN ASSISTANT:   ASSISTANTS: none   ANESTHESIA:   general  EBL:     BLOOD ADMINISTERED:none  DRAINS: none   LOCAL MEDICATIONS USED:  MARCAINE     SPECIMEN:  No Specimen  DISPOSITION OF SPECIMEN:  N/A  COUNTS:  YES  TOURNIQUET:  * No tourniquets in log *  DICTATION: .Dragon Dictation After informed consent was obtained the patient was brought to the operating room placed in the supine position on the operating table. After adequate induction of general anesthesia a roll was placed between the patient's shoulder blades to extend the shoulder slightly. Her right chest area and neck area were then prepped with ChloraPrep, allowed to dry, and draped in usual sterile manner. The patient was placed in Trendelenburg position. A small incision was made lateral to the bend of the clavicle on the right chest. A large bore finder needle was placed behind the bend of the clavicle leading towards the sternal notch and in doing so we were able to access the right subclavian vein. We placed a wire through the needle using the Seldinger technique without difficulty. Unfortunately on fluoroscopy images the wire went straight through to the other side and would not curve into the superior vena cava. The wire was then removed. We then found the right IJ in the neck using the large bore finder needle. The wire was placed through the needle using the Seldinger technique without difficulty. The wire was confirmed in the central venous system using real-time fluoroscopy. The incision on the right chest was then extended. A subcutaneous pocket was created by blunt finger dissection and sharp dissection with electrocautery inferior  to this incision. Blunt hemostat dissection was then used to connect the incision on the neck to the incision on the chest. The tubing was brought through this tract. The tubing was placed on the reservoir the reservoir was placed in the pocket. The length of the tubing was estimated using real-time fluoroscopy and the tubing was cut to fit. Next a sheath and dilator were placed over the wire using the Seldinger technique without difficulty. The wire and dilator were removed. The tubing was fed through the sheath as far as it could be fed and then held in place while the sheath was gently cracked and separated. A real-time fluoroscopy image showed the tip of the catheter to be in the distal superior vena cava. The tubing was then anchored to the reservoir with the permanent anchor. The reservoir was anchored in the pocket with 2 2-0 Prolene stitches. The a port was then aspirated and it aspirated blood easily. The port was then flushed initially with a dilute heparin solution and then with more concentrated heparin solution. The incision on the neck was closed with an interrupted 4-0 Monocryl stitch. The incision on the chest was closed with a deep layer of 3-0 Vicryl stitches and the skin was then closed by running 4-0 Monocryl subcuticular stitch. Dermabond dressings were applied. The patient tolerated the procedure well. At the end of the case all needle sponge and instrument counts were correct. The patient was then awakened and taken to recovery in stable condition.  PLAN OF CARE: Discharge to home after PACU  PATIENT  DISPOSITION:  PACU - hemodynamically stable.   Delay start of Pharmacological VTE agent (>24hrs) due to surgical blood loss or risk of bleeding: not applicable

## 2013-01-09 NOTE — Anesthesia Postprocedure Evaluation (Signed)
  Anesthesia Post-op Note  Patient: [name]  Procedure(s) Performed: Procedure(s): INSERTION PORT-A-CATH (Right)  Patient Location: PACU   Anesthesia Type:General  Level of Consciousness: awake  Airway and Oxygen Therapy: Patient Spontanous Breathing  Post-op Pain: mild  Post-op Assessment: Post-op Vital signs reviewed  Post-op Vital Signs: Reviewed  Complications: No apparent anesthesia complications

## 2013-01-09 NOTE — H&P (View-Only) (Signed)
[name]  10/22/2012 3:00 PM   Office Visit  MRN:  [id number]   Description: 37 year old female  Provider: Robyne Askew, MD  Department: [facility]        Diagnoses    Cancer of lower-inner quadrant of female breast, left    -  Primary    174.3         Current Vitals - Last Recorded    LMP              10/02/2012                 Progress Notes    Robyne Askew, MD at 10/22/2012  3:48 PM    Status: Signed                   Patient ID: [name], female   DOB: 1975/09/21, 37 y.o.   MRN: [id number]   No chief complaint on file.     HPI [name] is a 37 y.o. female.  We are asked to see the pt in consultation by Dr. Jean Rosenthal to evaluate her for left breast cancer. The pt is a 37 yo wf who recently went for her first baseline mammogram. At that time she was found to have a mass in the 8 o clock position of the left breast. 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 She denies any breast pain or discharge from the nipple. She had genetics done in [place]. She does have a grandmother with breast cancer. The mass measured 1.6cm by u/s. She was er and pr + and her2 neg. HPI    Past Medical History   Diagnosis  Date   .  Hypertension     .  Anxiety           Past Surgical History   Procedure  Laterality  Date   .  Cholecystectomy       .  Tonsillectomy       .  Cesarean section             Family History   Problem  Relation  Age of Onset   .  Breast cancer  Maternal Grandmother     .  Lung cancer  Paternal Grandmother          Social History History   Substance Use Topics   .  Smoking status:  Current Every Day Smoker -- 1.00 packs/day       Types:  Cigarettes   .  Smokeless tobacco:  Not on file   .  Alcohol Use:  Yes         Comment: 1 drink a week         Allergies   Allergen  Reactions   .  Amoxicillin         Per pt "does not work"         Current Outpatient Prescriptions   Medication   Sig  Dispense  Refill   .  ALPRAZolam (XANAX) 0.25 MG tablet  Take 0.25 mg by mouth at bedtime as needed for sleep.         .  hydrochlorothiazide (HYDRODIURIL) 25 MG tablet  Take 25 mg by mouth daily.         Marland Kitchen  lisinopril (PRINIVIL,ZESTRIL) 10 MG tablet  Take 10 mg by mouth daily.             No  current facility-administered medications for this visit.        Review of Systems Review of Systems  Constitutional: Negative.   HENT: Negative.   Eyes: Negative.   Respiratory: Negative.   Cardiovascular: Negative.   Gastrointestinal: Negative.   Endocrine: Negative.   Genitourinary: Negative.   Musculoskeletal: Negative.   Skin: Negative.   Allergic/Immunologic: Negative.   Neurological: Negative.   Hematological: Negative.   Psychiatric/Behavioral: Negative.       Last menstrual period 10/02/2012.   Physical Exam Physical Exam  Constitutional: She is oriented to person, place, and time. She appears well-developed and well-nourished.  HENT:   Head: Normocephalic and atraumatic.  Eyes: Conjunctivae and EOM are normal. Pupils are equal, round, and reactive to light.  Neck: Normal range of motion. Neck supple.  Cardiovascular: Normal rate, regular rhythm and normal heart sounds.   Pulmonary/Chest: Effort normal and breath sounds normal.  There is no palpable mass in either breast. There is no palpable axillary, supraclavicular, or cervical lymphadenopathy  Abdominal: Soft. Bowel sounds are normal. She exhibits no mass. There is no tenderness.  Musculoskeletal: Normal range of motion.  Lymphadenopathy:    She has no cervical adenopathy.  Neurological: She is alert and oriented to person, place, and time.  Skin: Skin is warm and dry.  Psychiatric: She has a normal mood and affect. Her behavior is normal.      Data Reviewed As above   Assessment    The pt appears to have a stage I left breast cancer. I have discussed with her in detail the different options for  treatment and she favors breast conservation as long as her genetic testing is negative. I think she is a good candidate for this as well as sentinel node mapping. I have discussed with her in detail the risks and benefits of surgery as well as some of the technical aspects and she understands and wishes to proceed      Plan    Plan for left breast wire localized lumpectomy and sentinel node mapping if her genetics is negative. Will refer to plastics in case genetics is positive

## 2013-01-09 NOTE — Anesthesia Procedure Notes (Signed)
Procedure Name: LMA Insertion Performed by: Chanette Demo, [place] Pre-anesthesia Checklist: Patient identified, Emergency Drugs available, Suction available and Patient being monitored Patient Re-evaluated:Patient Re-evaluated prior to inductionOxygen Delivery Method: Circle System Utilized Preoxygenation: Pre-oxygenation with 100% oxygen Intubation Type: IV induction Ventilation: Mask ventilation without difficulty LMA: LMA inserted LMA Size: 4.0 Number of attempts: 1 Airway Equipment and Method: bite block Placement Confirmation: positive ETCO2 Tube secured with: Tape Dental Injury: Teeth and Oropharynx as per pre-operative assessment      

## 2013-01-09 NOTE — Progress Notes (Signed)
Mailed after appt letter to pt. 

## 2013-01-12 ENCOUNTER — Ambulatory Visit: Payer: BC Managed Care – PPO | Admitting: Adult Health

## 2013-01-12 ENCOUNTER — Ambulatory Visit (HOSPITAL_BASED_OUTPATIENT_CLINIC_OR_DEPARTMENT_OTHER): Payer: BC Managed Care – PPO | Admitting: Adult Health

## 2013-01-12 ENCOUNTER — Ambulatory Visit (HOSPITAL_COMMUNITY)
Admission: RE | Admit: 2013-01-12 | Discharge: 2013-01-12 | Disposition: A | Discharge status: Home / Self Care | Payer: BC Managed Care – PPO | Source: Ambulatory Visit | Attending: Oncology | Admitting: Oncology

## 2013-01-12 ENCOUNTER — Encounter (HOSPITAL_BASED_OUTPATIENT_CLINIC_OR_DEPARTMENT_OTHER): Payer: Self-pay | Admitting: General Surgery

## 2013-01-12 ENCOUNTER — Other Ambulatory Visit: Payer: Self-pay | Admitting: Emergency Medicine

## 2013-01-12 ENCOUNTER — Other Ambulatory Visit: Payer: BC Managed Care – PPO

## 2013-01-12 VITALS — BP 127/84 | HR 80 | Temp 98.4°F | Resp 20 | Ht 68.0 in | Wt 256.5 lb

## 2013-01-12 DIAGNOSIS — Z01818 Encounter for other preprocedural examination: Secondary | ICD-10-CM | POA: Insufficient documentation

## 2013-01-12 DIAGNOSIS — C50312 Malignant neoplasm of lower-inner quadrant of left female breast: Secondary | ICD-10-CM

## 2013-01-12 DIAGNOSIS — I079 Rheumatic tricuspid valve disease, unspecified: Secondary | ICD-10-CM | POA: Insufficient documentation

## 2013-01-12 DIAGNOSIS — C50919 Malignant neoplasm of unspecified site of unspecified female breast: Secondary | ICD-10-CM | POA: Insufficient documentation

## 2013-01-12 DIAGNOSIS — Z170 Estrogen receptor positive status [ER+]: Secondary | ICD-10-CM

## 2013-01-12 DIAGNOSIS — C50319 Malignant neoplasm of lower-inner quadrant of unspecified female breast: Secondary | ICD-10-CM

## 2013-01-12 DIAGNOSIS — I369 Nonrheumatic tricuspid valve disorder, unspecified: Secondary | ICD-10-CM

## 2013-01-12 DIAGNOSIS — I10 Essential (primary) hypertension: Secondary | ICD-10-CM | POA: Insufficient documentation

## 2013-01-12 DIAGNOSIS — E669 Obesity, unspecified: Secondary | ICD-10-CM | POA: Insufficient documentation

## 2013-01-12 MED ORDER — ALPRAZOLAM 0.25 MG PO TABS: 0.2500 mg | ORAL_TABLET | Freq: Every evening | ORAL | Status: DC | PRN

## 2013-01-12 MED ORDER — PROCHLORPERAZINE MALEATE 10 MG PO TABS: 10.0000 mg | ORAL_TABLET | Freq: Four times a day (QID) | ORAL | Status: DC | PRN

## 2013-01-12 MED ORDER — DEXAMETHASONE 4 MG PO TABS: ORAL_TABLET | ORAL | Status: DC

## 2013-01-12 MED ORDER — LIDOCAINE-PRILOCAINE 2.5-2.5 % EX CREA: TOPICAL_CREAM | CUTANEOUS | Status: DC | PRN

## 2013-01-12 MED ORDER — LORAZEPAM 0.5 MG PO TABS: 0.5000 mg | ORAL_TABLET | Freq: Four times a day (QID) | ORAL | Status: DC | PRN

## 2013-01-12 MED ORDER — ONDANSETRON HCL 8 MG PO TABS: 8.0000 mg | ORAL_TABLET | Freq: Two times a day (BID) | ORAL | Status: DC | PRN

## 2013-01-12 NOTE — Patient Instructions (Addendum)
Cyclophosphamide injection What is this medicine? CYCLOPHOSPHAMIDE (sye kloe FOSS fa mide) is a chemotherapy drug. It slows the growth of cancer cells. This medicine is used to treat many types of cancer like lymphoma, myeloma, leukemia, breast cancer, and ovarian cancer, to name a few. It is also used to treat nephrotic syndrome in children. This medicine may be used for other purposes; ask your health care provider or pharmacist if you have questions. What should I tell my health care provider before I take this medicine? They need to know if you have any of these conditions: -blood disorders -history of other chemotherapy -history of radiation therapy -infection -kidney disease -liver disease -tumors in the bone marrow -an unusual or allergic reaction to cyclophosphamide, other chemotherapy, other medicines, foods, dyes, or preservatives -pregnant or trying to get pregnant -breast-feeding How should I use this medicine? This drug is usually given as an injection into a vein or muscle or by infusion into a vein. It is administered in a hospital or clinic by a specially trained health care professional. Talk to your pediatrician regarding the use of this medicine in children. While this drug may be prescribed for selected conditions, precautions do apply. Overdosage: If you think you have taken too much of this medicine contact a poison control center or emergency room at once. NOTE: This medicine is only for you. Do not share this medicine with others. What if I miss a dose? It is important not to miss your dose. Call your doctor or health care professional if you are unable to keep an appointment. What may interact with this medicine? Do not take this medicine with any of the following medications: -mibefradil -nalidixic acid This medicine may also interact with the following medications: -doxorubicin -etanercept -medicines to increase blood counts like filgrastim, pegfilgrastim,  sargramostim -medicines that block muscle or nerve pain -St. John's Wort -phenobarbital -succinylcholine chloride -trastuzumab -vaccines Talk to your doctor or health care professional before taking any of these medicines: -acetaminophen -aspirin -ibuprofen -ketoprofen -naproxen This list may not describe all possible interactions. Give your health care provider a list of all the medicines, herbs, non-prescription drugs, or dietary supplements you use. Also tell them if you smoke, drink alcohol, or use illegal drugs. Some items may interact with your medicine. What should I watch for while using this medicine? Visit your doctor for checks on your progress. This drug may make you feel generally unwell. This is not uncommon, as chemotherapy can affect healthy cells as well as cancer cells. Report any side effects. Continue your course of treatment even though you feel ill unless your doctor tells you to stop. Drink water or other fluids as directed. Urinate often, even at night. In some cases, you may be given additional medicines to help with side effects. Follow all directions for their use. Call your doctor or health care professional for advice if you get a fever, chills or sore throat, or other symptoms of a cold or flu. Do not treat yourself. This drug decreases your body's ability to fight infections. Try to avoid being around people who are sick. This medicine may increase your risk to bruise or bleed. Call your doctor or health care professional if you notice any unusual bleeding. Be careful brushing and flossing your teeth or using a toothpick because you may get an infection or bleed more easily. If you have any dental work done, tell your dentist you are receiving this medicine. Avoid taking products that contain aspirin, acetaminophen, ibuprofen, naproxen,  or ketoprofen unless instructed by your doctor. These medicines may hide a fever. Do not become pregnant while taking this  medicine. Women should inform their doctor if they wish to become pregnant or think they might be pregnant. There is a potential for serious side effects to an unborn child. Talk to your health care professional or pharmacist for more information. Do not breast-feed an infant while taking this medicine. Men should inform their doctor if they wish to father a child. This medicine may lower sperm counts. If you are going to have surgery, tell your doctor or health care professional that you have taken this medicine. What side effects may I notice from receiving this medicine? Side effects that you should report to your doctor or health care professional as soon as possible: -allergic reactions like skin rash, itching or hives, swelling of the face, lips, or tongue -low blood counts - this medicine may decrease the number of white blood cells, red blood cells and platelets. You may be at increased risk for infections and bleeding. -signs of infection - fever or chills, cough, sore throat, pain or difficulty passing urine -signs of decreased platelets or bleeding - bruising, pinpoint red spots on the skin, black, tarry stools, blood in the urine -signs of decreased red blood cells - unusually weak or tired, fainting spells, lightheadedness -breathing problems -dark urine -mouth sores -pain, swelling, redness at site where injected -swelling of the ankles, feet, hands -trouble passing urine or change in the amount of urine -weight gain -yellowing of the eyes or skin Side effects that usually do not require medical attention (report to your doctor or health care professional if they continue or are bothersome): -changes in nail or skin color -diarrhea -hair loss -loss of appetite -missed menstrual periods -nausea, vomiting -stomach pain This list may not describe all possible side effects. Call your doctor for medical advice about side effects. You may report side effects to FDA at  1-800-FDA-1088. Where should I keep my medicine? This drug is given in a hospital or clinic and will not be stored at home. NOTE: This sheet is a summary. It may not cover all possible information. If you have questions about this medicine, talk to your doctor, pharmacist, or health care provider.  2013, [employer]. (06/10/2007 2:32:25 PM) Epirubicin injection What is this medicine? EPIRUBICIN (ep i ROO bi sin) is a chemotherapy drug. This medicine is used to treat breast cancer. This medicine may be used for other purposes; ask your health care provider or pharmacist if you have questions. What should I tell my health care provider before I take this medicine? They need to know if you have any of these conditions: -blood disorders -heart disease, recent heart attack -infection (especially a virus infection such as chickenpox, cold sores, or herpes) -irregular heartbeat -kidney disease -liver disease -recent or ongoing radiation therapy -an unusual or allergic reaction to epirubicin, other chemotherapy agents, other medicines, foods, dyes, or preservatives -pregnant or trying to get pregnant -breast-feeding How should I use this medicine? This drug is given as an infusion into a vein. It is administered in a hospital or clinic by a specially trained health care professional. If you have pain, swelling, burning or any unusual feeling around the site of your injection, tell your health care professional right away. Talk to your pediatrician regarding the use of this medicine in children. Special care may be needed. Overdosage: If you think you have taken too much of this medicine contact a  poison control center or emergency room at once. NOTE: This medicine is only for you. Do not share this medicine with others. What if I miss a dose? It is important not to miss your dose. Call your doctor or health care professional if you are unable to keep an appointment. What may interact  with this medicine? Do not take this medicine with any of the following medications: -cisapride -droperidol -halofantrine -pimozide This medicine may also interact with the following medications: -chloroquine -chlorpromazine -clarithromycin -cimetidine -cyclosporine -erythromycin -medicines for blood pressure like amlodipine, felodipine, nifedipine -medicines for depression, anxiety, or psychotic disturbances -medicines for irregular heart beat like amiodarone, bepridil, dofetilide, encainide, flecainide, propafenone, quinidine -medicines for nausea, vomiting like dolasetron, ondansetron, palonosetron -medicines to increase blood counts like filgrastim, pegfilgrastim, sargramostim -methadone -methotrexate -pentamidine -vaccines Talk to your doctor or health care professional before taking any of these medicines: -acetaminophen -aspirin -ibuprofen -ketoprofen -naproxen This list may not describe all possible interactions. Give your health care provider a list of all the medicines, herbs, non-prescription drugs, or dietary supplements you use. Also tell them if you smoke, drink alcohol, or use illegal drugs. Some items may interact with your medicine. What should I watch for while using this medicine? Your condition will be monitored carefully while you are receiving this medicine. You will need important blood work done while you are taking this medicine. This drug may make you feel generally unwell. This is not uncommon, as chemotherapy can affect healthy cells as well as cancer cells. Report any side effects. Continue your course of treatment even though you feel ill unless your doctor tells you to stop. Your urine may turn red for a few days after your dose. This is not blood. If your urine is dark or brown, call your doctor. In some cases, you may be given additional medicines to help with side effects. Follow all directions for their use. Call your doctor or health care  professional for advice if you get a fever, chills or sore throat, or other symptoms of a cold or flu. Do not treat yourself. This drug decreases your body's ability to fight infections. Try to avoid being around people who are sick. This medicine may increase your risk to bruise or bleed. Call your doctor or health care professional if you notice any unusual bleeding. Be careful brushing and flossing your teeth or using a toothpick because you may get an infection or bleed more easily. If you have any dental work done, tell your dentist you are receiving this medicine. Avoid taking products that contain aspirin, acetaminophen, ibuprofen, naproxen, or ketoprofen unless instructed by your doctor. These medicines may hide a fever. Men and women of childbearing age should use effective birth control methods while using taking this medicine. Do not become pregnant while taking this medicine. There is a potential for serious side effects to an unborn child. Talk to your health care professional or pharmacist for more information. Do not breast-feed an infant while taking this medicine. What side effects may I notice from receiving this medicine? Side effects that you should report to your doctor or health care professional as soon as possible: -allergic reactions like skin rash, itching or hives, swelling of the face, lips, or tongue -low blood counts - this medicine may decrease the number of white blood cells, red blood cells and platelets. You may be at increased risk for infections and bleeding. -signs of infection - fever or chills, cough, sore throat, pain or difficulty passing urine -signs  of decreased platelets or bleeding - bruising, pinpoint red spots on the skin, black, tarry stools, blood in the urine -signs of decreased red blood cells - unusually weak or tired, fainting spells, lightheadedness -breathing problems -chest pain -gout pain -fast, irregular heartbeat -mouth sores -pain,  swelling, redness at site where injected -swelling of ankles, feet, or hands Side effects that usually do not require medical attention (report to your doctor or health care professional if they continue or are bothersome): -changes in skin or nail color -diarrhea -hair loss -hot flashes, facial flushing -increased skin sensitivity to the sun -loss of appetite -nausea, vomiting -red colored urine -stomach upset This list may not describe all possible side effects. Call your doctor for medical advice about side effects. You may report side effects to FDA at 1-800-FDA-1088. Where should I keep my medicine? This drug is given in a hospital or clinic and will not be stored at home. NOTE: This sheet is a summary. It may not cover all possible information. If you have questions about this medicine, talk to your doctor, pharmacist, or health care provider.  2012, [employer]. (07/07/2007 5:21:25 PM)Fluorouracil, 5-FU injection What is this medicine? FLUOROURACIL, 5-FU (flure oh YOOR a sil) is a chemotherapy drug. It slows the growth of cancer cells. This medicine is used to treat many types of cancer like breast cancer, colon or rectal cancer, pancreatic cancer, and stomach cancer. This medicine may be used for other purposes; ask your health care provider or pharmacist if you have questions. What should I tell my health care provider before I take this medicine? They need to know if you have any of these conditions: -blood disorders -dihydropyrimidine dehydrogenase (DPD) deficiency -infection (especially a virus infection such as chickenpox, cold sores, or herpes) -kidney disease -liver disease -malnourished, poor nutrition -recent or ongoing radiation therapy -an unusual or allergic reaction to fluorouracil, other chemotherapy, other medicines, foods, dyes, or preservatives -pregnant or trying to get pregnant -breast-feeding How should I use this medicine? This drug is given as  an infusion or injection into a vein. It is administered in a hospital or clinic by a specially trained health care professional. Talk to your pediatrician regarding the use of this medicine in children. Special care may be needed. Overdosage: If you think you have taken too much of this medicine contact a poison control center or emergency room at once. NOTE: This medicine is only for you. Do not share this medicine with others. What if I miss a dose? It is important not to miss your dose. Call your doctor or health care professional if you are unable to keep an appointment. What may interact with this medicine? -allopurinol -cimetidine -dapsone -digoxin -hydroxyurea -leucovorin -levamisole -medicines for seizures like ethotoin, fosphenytoin, phenytoin -medicines to increase blood counts like filgrastim, pegfilgrastim, sargramostim -medicines that treat or prevent blood clots like warfarin, enoxaparin, and dalteparin -methotrexate -metronidazole -pyrimethamine -some other chemotherapy drugs like busulfan, cisplatin, estramustine, vinblastine -trimethoprim -trimetrexate -vaccines Talk to your doctor or health care professional before taking any of these medicines: -acetaminophen -aspirin -ibuprofen -ketoprofen -naproxen This list may not describe all possible interactions. Give your health care provider a list of all the medicines, herbs, non-prescription drugs, or dietary supplements you use. Also tell them if you smoke, drink alcohol, or use illegal drugs. Some items may interact with your medicine. What should I watch for while using this medicine? Visit your doctor for checks on your progress. This drug may make you feel generally unwell. This  is not uncommon, as chemotherapy can affect healthy cells as well as cancer cells. Report any side effects. Continue your course of treatment even though you feel ill unless your doctor tells you to stop. In some cases, you may be given  additional medicines to help with side effects. Follow all directions for their use. Call your doctor or health care professional for advice if you get a fever, chills or sore throat, or other symptoms of a cold or flu. Do not treat yourself. This drug decreases your body's ability to fight infections. Try to avoid being around people who are sick. This medicine may increase your risk to bruise or bleed. Call your doctor or health care professional if you notice any unusual bleeding. Be careful brushing and flossing your teeth or using a toothpick because you may get an infection or bleed more easily. If you have any dental work done, tell your dentist you are receiving this medicine. Avoid taking products that contain aspirin, acetaminophen, ibuprofen, naproxen, or ketoprofen unless instructed by your doctor. These medicines may hide a fever. Do not become pregnant while taking this medicine. Women should inform their doctor if they wish to become pregnant or think they might be pregnant. There is a potential for serious side effects to an unborn child. Talk to your health care professional or pharmacist for more information. Do not breast-feed an infant while taking this medicine. Men should inform their doctor if they wish to father a child. This medicine may lower sperm counts. Do not treat diarrhea with over the counter products. Contact your doctor if you have diarrhea that lasts more than 2 days or if it is severe and watery. This medicine can make you more sensitive to the sun. Keep out of the sun. If you cannot avoid being in the sun, wear protective clothing and use sunscreen. Do not use sun lamps or tanning beds/booths. What side effects may I notice from receiving this medicine? Side effects that you should report to your doctor or health care professional as soon as possible: -allergic reactions like skin rash, itching or hives, swelling of the face, lips, or tongue -low blood counts - this  medicine may decrease the number of white blood cells, red blood cells and platelets. You may be at increased risk for infections and bleeding. -signs of infection - fever or chills, cough, sore throat, pain or difficulty passing urine -signs of decreased platelets or bleeding - bruising, pinpoint red spots on the skin, black, tarry stools, blood in the urine -signs of decreased red blood cells - unusually weak or tired, fainting spells, lightheadedness -breathing problems -changes in vision -chest pain -mouth sores -nausea and vomiting -pain, swelling, redness at site where injected -pain, tingling, numbness in the hands or feet -redness, swelling, or sores on hands or feet -stomach pain -unusual bleeding Side effects that usually do not require medical attention (report to your doctor or health care professional if they continue or are bothersome): -changes in finger or toe nails -diarrhea -dry or itchy skin -hair loss -headache -loss of appetite -sensitivity of eyes to the light -stomach upset -unusually teary eyes This list may not describe all possible side effects. Call your doctor for medical advice about side effects. You may report side effects to FDA at 1-800-FDA-1088. Where should I keep my medicine? This drug is given in a hospital or clinic and will not be stored at home. NOTE: This sheet is a summary. It may not cover all possible information.  If you have questions about this medicine, talk to your doctor, pharmacist, or health care provider.  2012, [employer]. (07/09/2007 1:53:16 PM)

## 2013-01-12 NOTE — Telephone Encounter (Signed)
appts made and printed. Pt is aware that tx's will be added. i emailed MW to add the tx's...td 

## 2013-01-12 NOTE — Progress Notes (Addendum)
OFFICE PROGRESS NOTE  CC  [name] [address] [place] [address] Dr. Chevis Pretty  Dr. 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 Patient was seen in the multidisciplinary breast clinic for discussion of treatment options.   STAGE:  Cancer of lower-inner quadrant of female breast  Primary site: Breast (Left)  Staging method: AJCC 7th Edition  Clinical: Stage IA (T1c, N0, cM0)  Summary: Stage IA (T1c, N0, cM0)   PRIOR THERAPY: #1Patient had a screening mammogram in [place] on 09/23/2012 she was felt to have a new mass within the left breast. She had ultrasound on 10/06/2012 which showed a 1.6 x 1.3 x 1.3 cm mass at the 8:00 position 7 cm from the nipple. At the 12:00 position behind and nipple was circumscribed oval shaped 2 x 1.8 x 1.2 cm lesion perhaps representing a fibroadenoma. Ultrasound-guided biopsies were performed on 10/06/2012 that revealed fibrocystic changes at the 12:00 and invasive ductal carcinoma at the 8:00 position. The carcinoma was grade 2-3 with a Ki-67 25% HER-2/neu negative. ER positive PR positive at 80%. Her [facility] from [facility] was reviewed by Dr. Colonel Bald. Patient did have blood work performed for genetic testing in [place] day prior to her visit today. However she has not been seen formally by genetic counselor. Patient had MRIs of the breasts performed   #2 patient is now status post lumpectomy with sentinel lymph node biopsy with the final pathology revealing a 2.3 cm invasive ductal carcinoma, grade 3 one sentinel node had microscopic disease. Tumor was ER positive PR positive with pleural effusion marker Ki-67 25%. Her final pathologic staging is T2 N1 microscopic.  #3 patient did have genetic testing performed in [place] and reportedly it is negative for BRCA1 and BRCA2  #4 Patient will start FEC on 01/23/13.   CURRENT THERAPY: to begin  chemotherapy 01/23/13  INTERVAL HISTORY: [name] 37 y.o. female returns for followup visit to discuss her upcoming chemotherapy regimen and treatment plan.  She is doing well today and underwent an echocardiogram as well as chemotherapy teaching class.  She had a port placed by Dr. Carolynne Edouard last week and is recovering from this.  It remains slightly sore.  She denies fevers, chills, nausea, vomiting, constipation, diarrhea, numbness, pain, or any further concerns.    MEDICAL HISTORY: Past Medical History  Diagnosis Date  . Urinary incontinence     occasional  . Family history of anesthesia complication     pt's mother has hx. of post-op N/V  . Hypertension     under control with med., has been on med. since 09/2012  . Anxiety   . Breast cancer     left    ALLERGIES:  is allergic to amoxicillin.  MEDICATIONS:  Current Outpatient Prescriptions  Medication Sig Dispense Refill  . HYDROcodone-acetaminophen (NORCO/VICODIN) 5-325 MG per tablet Take 1-2 tablets by mouth every 4 (four) hours as needed for pain.  50 tablet  1  . lisinopril (PRINIVIL,ZESTRIL) 10 MG tablet Take 10 mg by mouth daily.      Marland Kitchen ALPRAZolam (XANAX) 0.25 MG tablet Take 1 tablet (0.25 mg total) by mouth at bedtime as needed for sleep.  30 tablet  2  . dexamethasone (DECADRON) 4 MG tablet Take 2 tablets by mouth once a day on the day after chemotherapy and then take 2 tablets two times a day for 2 days. Take with food.  30  tablet  1  . lidocaine-prilocaine (EMLA) cream Apply topically as needed.  30 g  0  . LORazepam (ATIVAN) 0.5 MG tablet Take 1 tablet (0.5 mg total) by mouth every 6 (six) hours as needed (Nausea or vomiting).  30 tablet  0  . ondansetron (ZOFRAN) 8 MG tablet Take 1 tablet (8 mg total) by mouth 2 (two) times daily as needed. Take two times a day as needed for nausea or vomiting starting on the third day after chemotherapy.  30 tablet  1  . oxyCODONE-acetaminophen (ROXICET) 5-325 MG per tablet Take 1  tablet by mouth every 4 (four) hours as needed for pain.  50 tablet  0  . prochlorperazine (COMPAZINE) 10 MG tablet Take 1 tablet (10 mg total) by mouth every 6 (six) hours as needed (Nausea or vomiting).  30 tablet  1   No current facility-administered medications for this visit.    SURGICAL HISTORY:  Past Surgical History  Procedure Laterality Date  . Cesarean section  1999  . Breast lumpectomy with needle localization and axillary sentinel lymph node bx Left 12/10/2012    Procedure: BREAST LUMPECTOMY WITH NEEDLE LOCALIZATION AND AXILLARY SENTINEL LYMPH NODE BX;  Surgeon: Robyne Askew, MD;  Location: [facility] OR;  Service: General;  Laterality: Left;  Needle loc BCG 7:30  nuc med 9:30    . Tonsillectomy and adenoidectomy      as a teenager  . Cholecystectomy  1999  . Open reduction internal fixation (orif) tibia/fibula fracture Right   . Portacath placement Right 01/09/2013    Procedure: INSERTION PORT-A-CATH;  Surgeon: Robyne Askew, MD;  Location: [facility];  Service: General;  Laterality: Right;    REVIEW OF SYSTEMS:  A 10 point review of systems was conducted and is otherwise negative except for what is noted above.    PHYSICAL EXAMINATION: Blood pressure 127/84, pulse 80, temperature 98.4 F (36.9 C), temperature source Oral, resp. rate 20, height 5\' 8"  (1.727 m), weight 256 lb 8 oz (116.348 kg), last menstrual period 12/28/2012. Body mass index is 39.01 kg/(m^2). General: Patient is a well appearing female in no acute distress HEENT: PERRLA, sclerae anicteric no conjunctival pallor, MMM Neck: supple, no palpable adenopathy, port placed in right upper chest wall, right neck slightly tender and has minimal swelling Lungs: clear to auscultation bilaterally, no wheezes, rhonchi, or rales Cardiovascular: regular rate rhythm, S1, S2, no murmurs, rubs or gallops Abdomen: Soft, non-tender, non-distended, normoactive bowel sounds, no HSM Extremities: warm and well  perfused, no clubbing, cyanosis, or edema Skin: No rashes or lesions Neuro: Non-focal ECOG PERFORMANCE STATUS: 0 - Asymptomatic      LABORATORY DATA: Lab Results  Component Value Date   WBC 10.1 12/03/2012   HGB 13.2 12/03/2012   HCT 38.1 12/03/2012   MCV 91.6 12/03/2012   PLT 248 12/03/2012      Chemistry      Component Value Date/Time   NA 138 12/03/2012 1409   NA 137 10/22/2012 1217   K 3.6 12/03/2012 1409   K 3.5 10/22/2012 1217   CL 103 12/03/2012 1409   CO2 26 12/03/2012 1409   CO2 25 10/22/2012 1217   BUN 13 12/03/2012 1409   BUN 12.7 10/22/2012 1217   CREATININE 0.77 12/03/2012 1409   CREATININE 0.8 10/22/2012 1217      Component Value Date/Time   CALCIUM 8.8 12/03/2012 1409   CALCIUM 9.6 10/22/2012 1217   ALKPHOS 74 10/22/2012 1217   AST  18 10/22/2012 1217   ALT 20 10/22/2012 1217   BILITOT 0.69 10/22/2012 1217     ADDITIONAL INFORMATION: 3. CHROMOGENIC IN-SITU HYBRIDIZATION Results: HER-2/NEU BY [facility] - NO AMPLIFICATION OF HER-2 DETECTED. RESULT RATIO OF HER2: CEP 17 SIGNALS 1.75 AVERAGE HER2 COPY NUMBER PER CELL 2.45 REFERENCE RANGE NEGATIVE HER2/Chr17 Ratio <2.0 and Average HER2 copy number <4.0 EQUIVOCAL HER2/Chr17 Ratio <2.0 and Average HER2 copy number 4.0 and <6.0 POSITIVE HER2/Chr17 Ratio >=2.0 and/or Average HER2 copy number >=6.0 Abigail Miyamoto MD Pathologist, Electronic Signature ( Signed 12/17/2012) FINAL DIAGNOSIS Diagnosis 1. Lymph node, sentinel, biopsy, left - ONE LYMPH NODE POSITIVE FOR MICROMETASTATIC DUCTAL CARCINOMA (1/1). 2. Lymph node, sentinel, biopsy, left - ONE BENIGN LYMPH NODE WITH NO TUMOR SEEN (0/1). 3. Breast, lumpectomy, left - INVASIVE GRADE III DUCTAL CARCINOMA SPANNING 2.3 CM IN GREATEST DIMENSION. - ASSOCIATED INTERMEDIATE GRADE DUCTAL CARCINOMA IN SITU WITH NECROSIS. - LYMPH/VASCULAR INVASION IS IDENTIFIED. - MARGINS ARE NEGATIVE. 1 of 3 FINAL for [name], [name] ([id number]) Diagnosis(continued) - SEE ONCOLOGY TEMPLATE 4. Breast,  excision, left - BENIGN BREAST PARENCHYMA. - NO TUMOR SEEN. Microscopic Comment 3. BREAST, INVASIVE TUMOR, WITH LYMPH NODE SAMPLING Specimen, including laterality and lymph node sampling (sentinel, non-sentinel): Left partial breast with additional left excision and sentinel lymph node sampling. Procedure: Left breast lumpectomy with additional left breast excision and sentinel lymph node biopsy. Histologic type: Ductal carcinoma. Grade: III. Tubule formation: 3. Nuclear pleomorphism: 2. Mitotic: 3. Tumor size (gross measurement): 2.3 cm. Margins: Invasive, distance to closest margin: 0.4 cm (superior margin). In-situ, distance to closest margin: At least 0.4 cm (superior margin). Lymphovascular invasion: Identified. Ductal carcinoma in situ: Yes. Grade: Intermediate grade. Extensive intraductal component: No. Lobular neoplasia: No. Tumor focality: Unifocal. Treatment effect: N/A. Extent of tumor: Tumor confined to breast. Skin: Not involved. Nipple: Not received. Skeletal muscle: Not received. Lymph nodes: Examined: 2 Sentinel. 0 Non-sentinel. 2 Total. Lymph nodes with metastasis: 1. Micrometastasis: (> 0.2 mm and < 2.0 mm): 1. Extracapsular extension: Not identified. Breast prognostic profile: Reported on previous case per outside slides ([id number]). Estrogen receptor: Approximately 80%. Progesterone receptor: Approximately 80%. Her 2 neu: Not amplified. Ki-67: 25%. Non-neoplastic breast: Fibrocystic changes, benign skin present. TNM: pT2, pN30mi, MX. Comments: As Her-2 neu by [facility] was previously negative (not amplified), it will be repeated on the invasive tumor and reported in an addendum.  RADIOGRAPHIC STUDIES:  Ct Chest W Contrast  01/02/2013   CLINICAL DATA:  Breast cancer. Evaluate for metastatic disease.  EXAM: CT CHEST, ABDOMEN, AND PELVIS WITH CONTRAST  TECHNIQUE: Multidetector CT imaging of the chest, abdomen and pelvis was performed following the  standard protocol during bolus administration of intravenous contrast.  CONTRAST:  OMNIPAQUE IOHEXOL 300 MG/ML  SOLN  COMPARISON:  Breast MR 10/22/2012.  FINDINGS: CT CHEST FINDINGS  9 mm low-attenuation lesion in the left lobe of the thyroid. No pathologically enlarged mediastinal, hilar, right axillary or internal mammary lymph nodes. Postoperative changes and a small fluid collection, measuring 2.1 x 2.5 cm, are seen in the left axilla. Largest left axillary lymph node measures 10 mm (image 18). Postoperative changes are seen in the left breast, including a 3.2 x 6.3 cm fluid collection medially. Heart is at the upper limits of normal in size. No pericardial effusion.  There are a few scattered tiny pulmonary nodular densities, measuring up to 3 mm in the left upper lobe (image 20). No pleural fluid. Airway is unremarkable.  CT ABDOMEN AND PELVIS FINDINGS  Liver is decreased  in attenuation diffusely with two hyper attenuating lesions in the dome of the liver, measuring up to 2.1 cm (image 44). Cholecystectomy. Adrenal glands and right kidney are unremarkable. Tiny stone in the lower pole left kidney. Low-attenuation lesions in the left kidney measure up to 11 mm, and are likely cysts. Spleen, pancreas, stomach and colon are unremarkable.  Uterus and ovaries are visualized. There may be small fibroids within the uterus, creating small contour abnormalities. No pathologically enlarged lymph nodes. No free fluid. No worrisome lytic or sclerotic lesions.  IMPRESSION: 1. Postoperative changes in the left breast and left axilla without evidence of intrathoracic metastatic disease. 2. Hepatic steatosis with two hyper attenuating lesions in the dome of the liver. While these may represent hemangiomas, metastatic disease cannot be definitively excluded. MR abdomen without and with contrast could be performed in further evaluation, as clinically indicated. 3. Few scattered tiny pulmonary nodular densities,  nonspecific. While a benign etiology is favored, continued attention on followup exams is warranted. 4. Tiny left renal stone.   Electronically Signed   By: Leanna Battles M.D.   On: 01/02/2013 09:32   Ct Abdomen Pelvis W Contrast  01/02/2013   CLINICAL DATA:  Breast cancer. Evaluate for metastatic disease.  EXAM: CT CHEST, ABDOMEN, AND PELVIS WITH CONTRAST  TECHNIQUE: Multidetector CT imaging of the chest, abdomen and pelvis was performed following the standard protocol during bolus administration of intravenous contrast.  CONTRAST:  OMNIPAQUE IOHEXOL 300 MG/ML  SOLN  COMPARISON:  Breast MR 10/22/2012.  FINDINGS: CT CHEST FINDINGS  9 mm low-attenuation lesion in the left lobe of the thyroid. No pathologically enlarged mediastinal, hilar, right axillary or internal mammary lymph nodes. Postoperative changes and a small fluid collection, measuring 2.1 x 2.5 cm, are seen in the left axilla. Largest left axillary lymph node measures 10 mm (image 18). Postoperative changes are seen in the left breast, including a 3.2 x 6.3 cm fluid collection medially. Heart is at the upper limits of normal in size. No pericardial effusion.  There are a few scattered tiny pulmonary nodular densities, measuring up to 3 mm in the left upper lobe (image 20). No pleural fluid. Airway is unremarkable.  CT ABDOMEN AND PELVIS FINDINGS  Liver is decreased in attenuation diffusely with two hyper attenuating lesions in the dome of the liver, measuring up to 2.1 cm (image 44). Cholecystectomy. Adrenal glands and right kidney are unremarkable. Tiny stone in the lower pole left kidney. Low-attenuation lesions in the left kidney measure up to 11 mm, and are likely cysts. Spleen, pancreas, stomach and colon are unremarkable.  Uterus and ovaries are visualized. There may be small fibroids within the uterus, creating small contour abnormalities. No pathologically enlarged lymph nodes. No free fluid. No worrisome lytic or sclerotic lesions.   IMPRESSION: 1. Postoperative changes in the left breast and left axilla without evidence of intrathoracic metastatic disease. 2. Hepatic steatosis with two hyper attenuating lesions in the dome of the liver. While these may represent hemangiomas, metastatic disease cannot be definitively excluded. MR abdomen without and with contrast could be performed in further evaluation, as clinically indicated. 3. Few scattered tiny pulmonary nodular densities, nonspecific. While a benign etiology is favored, continued attention on followup exams is warranted. 4. Tiny left renal stone.   Electronically Signed   By: Leanna Battles M.D.   On: 01/02/2013 09:32   Nm Sentinel Node Inj-no Rpt (breast)  12/10/2012   CLINICAL DATA: left breast cancer   Sulfur colloid was injected  intradermally by the nuclear medicine  technologist for breast cancer sentinel node localization.    [employer] [facility] Breast Loc Dev   1st Lesion  Inc US Guide  12/10/2012   *RADIOLOGY REPORT*  Clinical Data:  Known left breast carcinoma.  Preoperative localization.  NEEDLE LOCALIZATION USING ULTRASOUND GUIDANCE AND SPECIMEN RADIOGRAPH  Comparison: Previous exams.  Patient presents for needle localization prior to surgical excision of the left breast mass.  I met with the patient and we discussed the procedure of needle localization including benefits and alternatives. We discussed the high likelihood of a successful procedure. We discussed the risks of the procedure, including infection, bleeding, tissue injury, and further surgery. Informed, written consent was given. The usual time-out protocol was performed immediately prior to the procedure.  Using ultrasound guidance, sterile technique, 2% lidocaine and a 7 cm Ultra wire  the left breast mass was localized using an inferior/medial approach.  The films are marked for Dr. Carolynne Edouard.  Specimen radiograph is performed at surgery, and confirms the mass, clip, and intact wire to be present in the tissue sample.   The specimen is marked for pathology.  IMPRESSION: Needle localization of the left breast as discussed above.  No apparent complications.   Original Report Authenticated By: Rolla Plate, M.D.    ASSESSMENT: 37 year old female with  #1 stage II invasive ductal carcinoma, grade 3 ER positive PR positive HER-2/neu negative with an elevated Ki-67. Patient is status post lumpectomy of the left breast with 1 of 2 sentinel nodes revealing microscopic disease. This was discussed and she will not receive axillary lymph node dissection and will undergo radiation following the completion of chemotherapy.    #2 patient certainly will need chemotherapy. She will receive 5-FU, epirubicin, Cytoxan with Neulasta support to be given every 2 weeks for a total of 6 cycles followed by Taxol on a weekly basis for 12 weeks. She underwent port placement by Dr. Carolynne Edouard on 01/09/13, and echo and chemotherapy class on 01/12/13.  She will begin chemotherapy on 01/23/13.     PLAN:   #1 Patient and I discussed her overall plan including the fact that she will not have an axillary node dissection, and have radiation to the area following chemotherapy.  We discussed her upcoming treatment plan of FEC to start on 11/7.  We discussed her scheduling and appointments to coordinate with the holidays and a beach trip the week of Thanksgiving.  I gave her her anti-emetics, EMLA cream, and refilled her Xanax.    #2  Echo is pending.    #3 I gave her detailed instructions about the chemotherapy in her after visit summary.  She knows she has to return on Saturday to receive Neulasta after chemotherapy.    #4 She will return on 01/23/13 for labs, evaluation and her first cycle of FEC.    All questions were answered. The patient knows to call the clinic with any problems, questions or concerns. We can certainly see the patient much sooner if necessary.  I spent 40 minutes counseling the patient face to face. The total time spent in the  appointment was 60 minutes.  Illa Level, NP Medical Oncology [facility] [phone]  ATTENDING'S ATTESTATION:  I personally reviewed patient's chart, examined patient myself, formulated the treatment plan as followed.    Patient will proceed with adjuvant chemotherapy consisting of FEC to start on 01/23/2013. Risks benefits and side effects of treatment were discussed with the patient. Patient did have microscopic metastasis  in the axilla but after an extensive discussion it was recommended not to have excellent lymph node dissection. Instead she will proceed with radiation post chemotherapy. All questions are answered today.  Drue Second, MD Medical/Oncology [facility] [phone] (beeper) [phone] (Office)  01/20/2013, 3:17 PM

## 2013-01-12 NOTE — Progress Notes (Signed)
  Echocardiogram 2D Echocardiogram has been performed.  Cathie Beams 01/12/2013, 9:54 AM

## 2013-01-13 ENCOUNTER — Telehealth: Payer: Self-pay | Admitting: *Deleted

## 2013-01-13 ENCOUNTER — Other Ambulatory Visit: Payer: Self-pay | Admitting: Certified Registered Nurse Anesthetist

## 2013-01-13 NOTE — Telephone Encounter (Signed)
Per staff message and POF I have scheduled appts.  JMW  

## 2013-01-14 ENCOUNTER — Other Ambulatory Visit: Payer: Self-pay | Admitting: Certified Registered Nurse Anesthetist

## 2013-01-14 LAB — POCT I-STAT, CHEM 8
BUN: 11 mg/dL (ref 6–23)
Creatinine, Ser: 0.8 mg/dL (ref 0.50–1.10)
Glucose, Bld: 97 mg/dL (ref 70–99)
HCT: 41 % (ref 36.0–46.0)
Hemoglobin: 13.9 g/dL (ref 12.0–15.0)
Potassium: 4.1 mEq/L (ref 3.5–5.1)
TCO2: 24 mmol/L (ref 0–100)

## 2013-01-14 NOTE — Progress Notes (Signed)
Subjective:     Patient ID: [name], female   DOB: 1975/08/14, 37 y.o.   MRN: [id number]  HPI The patient is a 37 year old white female who is 3 weeks status post left breast lumpectomy and sentinel node mapping for a T2 N1 MIC left breast cancer. She has some minor soreness but otherwise Feels well. She has minimal breast pain.  Review of Systems     Objective:   Physical Exam On exam her left breast and axillary incisions are healing nicely with no sign of infection or significant seroma.    Assessment:     The patient is 3 weeks status post left breast lumpectomy for breast cancer     Plan:     At this point with only microscopic disease in one of her lymph nodes I do not feel strongly that she needs a full axillary lymph node dissection. She will need radiation though. She is also planning to receive chemotherapy. She will need a Port-A-Cath placed. I've discussed with her in detail the risks and benefits of the operation placed the Port-A-Cath as well as some of the technical aspects and she understands and wishes to proceed

## 2013-01-16 ENCOUNTER — Other Ambulatory Visit (HOSPITAL_COMMUNITY): Payer: BC Managed Care – PPO

## 2013-01-21 NOTE — Progress Notes (Signed)
[facility] Psychosocial Distress Screening Clinical Social Work  Clinical Social Work was referred by distress screening protocol.  The patient scored a 6 on the Psychosocial Distress Thermometer which indicates moderate distress. Clinical Social Worker Intern telephoned to assess for distress and other psychosocial needs. Patient stated stress relating to work.  Patient understands current condition and stated some anxiousness concerning beginning chemo.  Patient stated she and Dr had conversed and taken action for possible side effects and anxiousness. CSWI provided supportive listening.  Patient agrees to seek out further assistance if needed.   Clinical Social Worker follow up needed: no  If yes, follow up plan:   [name] S. [facility] Clinical Social Work Intern [facility] [phone]

## 2013-01-22 ENCOUNTER — Other Ambulatory Visit: Payer: Self-pay

## 2013-01-22 ENCOUNTER — Other Ambulatory Visit: Payer: Self-pay | Admitting: Emergency Medicine

## 2013-01-22 DIAGNOSIS — C50319 Malignant neoplasm of lower-inner quadrant of unspecified female breast: Secondary | ICD-10-CM

## 2013-01-23 ENCOUNTER — Ambulatory Visit (HOSPITAL_BASED_OUTPATIENT_CLINIC_OR_DEPARTMENT_OTHER): Payer: BC Managed Care – PPO

## 2013-01-23 ENCOUNTER — Ambulatory Visit (HOSPITAL_BASED_OUTPATIENT_CLINIC_OR_DEPARTMENT_OTHER): Payer: BC Managed Care – PPO | Admitting: Oncology

## 2013-01-23 ENCOUNTER — Encounter: Payer: Self-pay | Admitting: Oncology

## 2013-01-23 ENCOUNTER — Other Ambulatory Visit: Payer: Self-pay | Admitting: Oncology

## 2013-01-23 ENCOUNTER — Other Ambulatory Visit (HOSPITAL_BASED_OUTPATIENT_CLINIC_OR_DEPARTMENT_OTHER): Payer: BC Managed Care – PPO

## 2013-01-23 VITALS — BP 132/93 | HR 104 | Temp 99.0°F | Resp 20

## 2013-01-23 VITALS — BP 141/85 | HR 85 | Temp 98.3°F | Resp 20 | Ht 68.0 in | Wt 255.3 lb

## 2013-01-23 DIAGNOSIS — C50319 Malignant neoplasm of lower-inner quadrant of unspecified female breast: Secondary | ICD-10-CM

## 2013-01-23 DIAGNOSIS — Z5111 Encounter for antineoplastic chemotherapy: Secondary | ICD-10-CM

## 2013-01-23 DIAGNOSIS — C50312 Malignant neoplasm of lower-inner quadrant of left female breast: Secondary | ICD-10-CM

## 2013-01-23 DIAGNOSIS — Z170 Estrogen receptor positive status [ER+]: Secondary | ICD-10-CM

## 2013-01-23 LAB — CBC WITH DIFFERENTIAL/PLATELET
BASO%: 0.4 % (ref 0.0–2.0)
Basophils Absolute: 0 10*3/uL (ref 0.0–0.1)
EOS%: 1.8 % (ref 0.0–7.0)
HCT: 37.8 % (ref 34.8–46.6)
HGB: 13 g/dL (ref 11.6–15.9)
MCH: 31.4 pg (ref 25.1–34.0)
MCHC: 34.4 g/dL (ref 31.5–36.0)
MCV: 91.3 fL (ref 79.5–101.0)
MONO%: 4.9 % (ref 0.0–14.0)
NEUT#: 6.4 10*3/uL (ref 1.5–6.5)
NEUT%: 61 % (ref 38.4–76.8)
Platelets: 235 10*3/uL (ref 145–400)
RBC: 4.14 10*6/uL (ref 3.70–5.45)
RDW: 13.1 % (ref 11.2–14.5)
lymph#: 3.4 10*3/uL — ABNORMAL HIGH (ref 0.9–3.3)

## 2013-01-23 LAB — COMPREHENSIVE METABOLIC PANEL
ALT: 30 U/L (ref 0–35)
AST: 27 U/L (ref 0–37)
Albumin: 3.2 g/dL — ABNORMAL LOW (ref 3.5–5.2)
Alkaline Phosphatase: 74 U/L (ref 39–117)
Calcium: 9.3 mg/dL (ref 8.4–10.5)
Chloride: 102 mEq/L (ref 96–112)
Potassium: 3.7 mEq/L (ref 3.5–5.3)
Sodium: 136 mEq/L (ref 135–145)
Total Protein: 6.5 g/dL (ref 6.0–8.3)

## 2013-01-23 MED ORDER — PALONOSETRON HCL INJECTION 0.25 MG/5ML
INTRAVENOUS | Status: AC
  Filled 2013-01-23: qty 5

## 2013-01-23 MED ORDER — SODIUM CHLORIDE 0.9 % IV SOLN
500.0000 mg/m2 | Freq: Once | INTRAVENOUS | Status: AC
  Administered 2013-01-23: 1180 mg via INTRAVENOUS
  Filled 2013-01-23: qty 59

## 2013-01-23 MED ORDER — FLUOROURACIL CHEMO INJECTION 2.5 GM/50ML
500.0000 mg/m2 | Freq: Once | INTRAVENOUS | Status: AC
  Administered 2013-01-23: 1200 mg via INTRAVENOUS
  Filled 2013-01-23: qty 24

## 2013-01-23 MED ORDER — SODIUM CHLORIDE 0.9 % IV SOLN
Freq: Once | INTRAVENOUS | Status: AC
  Administered 2013-01-23: 15:00:00 via INTRAVENOUS

## 2013-01-23 MED ORDER — FOSAPREPITANT DIMEGLUMINE INJECTION 150 MG
150.0000 mg | Freq: Once | INTRAVENOUS | Status: AC
  Administered 2013-01-23: 150 mg via INTRAVENOUS
  Filled 2013-01-23: qty 5

## 2013-01-23 MED ORDER — SODIUM CHLORIDE 0.9 % IJ SOLN
10.0000 mL | INTRAMUSCULAR | Status: DC | PRN
  Administered 2013-01-23: 10 mL
  Filled 2013-01-23: qty 10

## 2013-01-23 MED ORDER — PALONOSETRON HCL INJECTION 0.25 MG/5ML
0.2500 mg | Freq: Once | INTRAVENOUS | Status: AC
  Administered 2013-01-23: 0.25 mg via INTRAVENOUS

## 2013-01-23 MED ORDER — HEPARIN SOD (PORK) LOCK FLUSH 100 UNIT/ML IV SOLN
500.0000 [IU] | Freq: Once | INTRAVENOUS | Status: AC | PRN
  Administered 2013-01-23: 500 [IU]
  Filled 2013-01-23: qty 5

## 2013-01-23 MED ORDER — DEXAMETHASONE SODIUM PHOSPHATE 20 MG/5ML IJ SOLN
INTRAMUSCULAR | Status: AC
  Filled 2013-01-23: qty 5

## 2013-01-23 MED ORDER — DEXAMETHASONE SODIUM PHOSPHATE 20 MG/5ML IJ SOLN
12.0000 mg | Freq: Once | INTRAMUSCULAR | Status: AC
  Administered 2013-01-23: 12 mg via INTRAVENOUS

## 2013-01-23 MED ORDER — EPIRUBICIN HCL CHEMO IV INJECTION 200 MG/100ML
100.0000 mg/m2 | Freq: Once | INTRAVENOUS | Status: AC
  Administered 2013-01-23: 236 mg via INTRAVENOUS
  Filled 2013-01-23: qty 118

## 2013-01-23 NOTE — Progress Notes (Signed)
Patient did not receive Vincristine today, was added to AVS by mistake; patient is aware that she received Cytoxan, 5-FU, and Ellence.

## 2013-01-23 NOTE — Patient Instructions (Signed)
[facility] Discharge Instructions for Patients Receiving Chemotherapy  Today you received the following chemotherapy agents Ellence, 5-FU, Cytoxan  To help prevent nausea and vomiting after your treatment, we encourage you to take your nausea medication Zofran   If you develop nausea and vomiting that is not controlled by your nausea medication, call the clinic.   BELOW ARE SYMPTOMS THAT SHOULD BE REPORTED IMMEDIATELY:  *FEVER GREATER THAN 100.5 F  *CHILLS WITH OR WITHOUT FEVER  NAUSEA AND VOMITING THAT IS NOT CONTROLLED WITH YOUR NAUSEA MEDICATION  *UNUSUAL SHORTNESS OF BREATH  *UNUSUAL BRUISING OR BLEEDING  TENDERNESS IN MOUTH AND THROAT WITH OR WITHOUT PRESENCE OF ULCERS  *URINARY PROBLEMS  *BOWEL PROBLEMS  UNUSUAL RASH Items with * indicate a potential emergency and should be followed up as soon as possible.  Feel free to call the clinic you have any questions or concerns. The clinic phone number is [phone].    Epirubicin injection What is this medicine? EPIRUBICIN (ep i ROO bi sin) is a chemotherapy drug. This medicine is used to treat breast cancer. This medicine may be used for other purposes; ask your health care provider or pharmacist if you have questions. COMMON BRAND NAME(S): Ellence What should I tell my health care provider before I take this medicine? They need to know if you have any of these conditions: -blood disorders -heart disease, recent heart attack -infection (especially a virus infection such as chickenpox, cold sores, or herpes) -irregular heartbeat -kidney disease -liver disease -recent or ongoing radiation therapy -an unusual or allergic reaction to epirubicin, other chemotherapy agents, other medicines, foods, dyes, or preservatives -pregnant or trying to get pregnant -breast-feeding How should I use this medicine? This drug is given as an infusion into a vein. It is administered in a hospital or clinic by a  specially trained health care professional. If you have pain, swelling, burning or any unusual feeling around the site of your injection, tell your health care professional right away. Talk to your pediatrician regarding the use of this medicine in children. Special care may be needed. Overdosage: If you think you have taken too much of this medicine contact a poison control center or emergency room at once. NOTE: This medicine is only for you. Do not share this medicine with others. What if I miss a dose? It is important not to miss your dose. Call your doctor or health care professional if you are unable to keep an appointment. What may interact with this medicine? Do not take this medicine with any of the following medications: -cisapride -droperidol -halofantrine -pimozide This medicine may also interact with the following medications: -chloroquine -chlorpromazine -clarithromycin -cimetidine -cyclosporine -erythromycin -medicines for blood pressure like amlodipine, felodipine, nifedipine -medicines for depression, anxiety, or psychotic disturbances -medicines for irregular heart beat like amiodarone, bepridil, dofetilide, encainide, flecainide, propafenone, quinidine -medicines for nausea, vomiting like dolasetron, ondansetron, palonosetron -medicines to increase blood counts like filgrastim, pegfilgrastim, sargramostim -methadone -methotrexate -pentamidine -vaccines Talk to your doctor or health care professional before taking any of these medicines: -acetaminophen -aspirin -ibuprofen -ketoprofen -naproxen This list may not describe all possible interactions. Give your health care provider a list of all the medicines, herbs, non-prescription drugs, or dietary supplements you use. Also tell them if you smoke, drink alcohol, or use illegal drugs. Some items may interact with your medicine. What should I watch for while using this medicine? Your condition will be monitored  carefully while you are receiving this medicine. You will need important  blood work done while you are taking this medicine. This drug may make you feel generally unwell. This is not uncommon, as chemotherapy can affect healthy cells as well as cancer cells. Report any side effects. Continue your course of treatment even though you feel ill unless your doctor tells you to stop. Your urine may turn red for a few days after your dose. This is not blood. If your urine is dark or brown, call your doctor. In some cases, you may be given additional medicines to help with side effects. Follow all directions for their use. Call your doctor or health care professional for advice if you get a fever, chills or sore throat, or other symptoms of a cold or flu. Do not treat yourself. This drug decreases your body's ability to fight infections. Try to avoid being around people who are sick. This medicine may increase your risk to bruise or bleed. Call your doctor or health care professional if you notice any unusual bleeding. Be careful brushing and flossing your teeth or using a toothpick because you may get an infection or bleed more easily. If you have any dental work done, tell your dentist you are receiving this medicine. Avoid taking products that contain aspirin, acetaminophen, ibuprofen, naproxen, or ketoprofen unless instructed by your doctor. These medicines may hide a fever. Men and women of childbearing age should use effective birth control methods while using taking this medicine. Do not become pregnant while taking this medicine. There is a potential for serious side effects to an unborn child. Talk to your health care professional or pharmacist for more information. Do not breast-feed an infant while taking this medicine. There is a maximum amount of this medicine you should receive throughout your life. The amount depends on the medical condition being treated and your overall health. Your doctor will  watch how much of this medicine you receive in your lifetime. Tell your doctor if you have taken this medicine before. What side effects may I notice from receiving this medicine? Side effects that you should report to your doctor or health care professional as soon as possible: -allergic reactions like skin rash, itching or hives, swelling of the face, lips, or tongue -low blood counts - this medicine may decrease the number of white blood cells, red blood cells and platelets. You may be at increased risk for infections and bleeding. -signs of infection - fever or chills, cough, sore throat, pain or difficulty passing urine -signs of decreased platelets or bleeding - bruising, pinpoint red spots on the skin, black, tarry stools, blood in the urine -signs of decreased red blood cells - unusually weak or tired, fainting spells, lightheadedness -breathing problems -chest pain -gout pain -fast, irregular heartbeat -mouth sores -pain, swelling, redness at site where injected -swelling of ankles, feet, or hands Side effects that usually do not require medical attention (report to your doctor or health care professional if they continue or are bothersome): -changes in skin or nail color -diarrhea -hair loss -hot flashes, facial flushing -increased skin sensitivity to the sun -loss of appetite -nausea, vomiting -red colored urine -stomach upset This list may not describe all possible side effects. Call your doctor for medical advice about side effects. You may report side effects to FDA at 1-800-FDA-1088. Where should I keep my medicine? This drug is given in a hospital or clinic and will not be stored at home. NOTE: This sheet is a summary. It may not cover all possible information. If you have questions  about this medicine, talk to your doctor, pharmacist, or health care provider.  2014, [employer]. (2012-07-01 16:10:96)   Fluorouracil, 5-FU injection What is this  medicine? FLUOROURACIL, 5-FU (flure oh YOOR a sil) is a chemotherapy drug. It slows the growth of cancer cells. This medicine is used to treat many types of cancer like breast cancer, colon or rectal cancer, pancreatic cancer, and stomach cancer. This medicine may be used for other purposes; ask your health care provider or pharmacist if you have questions. COMMON BRAND NAME(S): Adrucil What should I tell my health care provider before I take this medicine? They need to know if you have any of these conditions: -blood disorders -dihydropyrimidine dehydrogenase (DPD) deficiency -infection (especially a virus infection such as chickenpox, cold sores, or herpes) -kidney disease -liver disease -malnourished, poor nutrition -recent or ongoing radiation therapy -an unusual or allergic reaction to fluorouracil, other chemotherapy, other medicines, foods, dyes, or preservatives -pregnant or trying to get pregnant -breast-feeding How should I use this medicine? This drug is given as an infusion or injection into a vein. It is administered in a hospital or clinic by a specially trained health care professional. Talk to your pediatrician regarding the use of this medicine in children. Special care may be needed. Overdosage: If you think you have taken too much of this medicine contact a poison control center or emergency room at once. NOTE: This medicine is only for you. Do not share this medicine with others. What if I miss a dose? It is important not to miss your dose. Call your doctor or health care professional if you are unable to keep an appointment. What may interact with this medicine? -allopurinol -cimetidine -dapsone -digoxin -hydroxyurea -leucovorin -levamisole -medicines for seizures like ethotoin, fosphenytoin, phenytoin -medicines to increase blood counts like filgrastim, pegfilgrastim, sargramostim -medicines that treat or prevent blood clots like warfarin, enoxaparin, and  dalteparin -methotrexate -metronidazole -pyrimethamine -some other chemotherapy drugs like busulfan, cisplatin, estramustine, vinblastine -trimethoprim -trimetrexate -vaccines Talk to your doctor or health care professional before taking any of these medicines: -acetaminophen -aspirin -ibuprofen -ketoprofen -naproxen This list may not describe all possible interactions. Give your health care provider a list of all the medicines, herbs, non-prescription drugs, or dietary supplements you use. Also tell them if you smoke, drink alcohol, or use illegal drugs. Some items may interact with your medicine. What should I watch for while using this medicine? Visit your doctor for checks on your progress. This drug may make you feel generally unwell. This is not uncommon, as chemotherapy can affect healthy cells as well as cancer cells. Report any side effects. Continue your course of treatment even though you feel ill unless your doctor tells you to stop. In some cases, you may be given additional medicines to help with side effects. Follow all directions for their use. Call your doctor or health care professional for advice if you get a fever, chills or sore throat, or other symptoms of a cold or flu. Do not treat yourself. This drug decreases your body's ability to fight infections. Try to avoid being around people who are sick. This medicine may increase your risk to bruise or bleed. Call your doctor or health care professional if you notice any unusual bleeding. Be careful brushing and flossing your teeth or using a toothpick because you may get an infection or bleed more easily. If you have any dental work done, tell your dentist you are receiving this medicine. Avoid taking products that contain aspirin, acetaminophen, ibuprofen,  naproxen, or ketoprofen unless instructed by your doctor. These medicines may hide a fever. Do not become pregnant while taking this medicine. Women should inform their  doctor if they wish to become pregnant or think they might be pregnant. There is a potential for serious side effects to an unborn child. Talk to your health care professional or pharmacist for more information. Do not breast-feed an infant while taking this medicine. Men should inform their doctor if they wish to father a child. This medicine may lower sperm counts. Do not treat diarrhea with over the counter products. Contact your doctor if you have diarrhea that lasts more than 2 days or if it is severe and watery. This medicine can make you more sensitive to the sun. Keep out of the sun. If you cannot avoid being in the sun, wear protective clothing and use sunscreen. Do not use sun lamps or tanning beds/booths. What side effects may I notice from receiving this medicine? Side effects that you should report to your doctor or health care professional as soon as possible: -allergic reactions like skin rash, itching or hives, swelling of the face, lips, or tongue -low blood counts - this medicine may decrease the number of white blood cells, red blood cells and platelets. You may be at increased risk for infections and bleeding. -signs of infection - fever or chills, cough, sore throat, pain or difficulty passing urine -signs of decreased platelets or bleeding - bruising, pinpoint red spots on the skin, black, tarry stools, blood in the urine -signs of decreased red blood cells - unusually weak or tired, fainting spells, lightheadedness -breathing problems -changes in vision -chest pain -mouth sores -nausea and vomiting -pain, swelling, redness at site where injected -pain, tingling, numbness in the hands or feet -redness, swelling, or sores on hands or feet -stomach pain -unusual bleeding Side effects that usually do not require medical attention (report to your doctor or health care professional if they continue or are bothersome): -changes in finger or toe nails -diarrhea -dry or itchy  skin -hair loss -headache -loss of appetite -sensitivity of eyes to the light -stomach upset -unusually teary eyes This list may not describe all possible side effects. Call your doctor for medical advice about side effects. You may report side effects to FDA at 1-800-FDA-1088. Where should I keep my medicine? This drug is given in a hospital or clinic and will not be stored at home. NOTE: This sheet is a summary. It may not cover all possible information. If you have questions about this medicine, talk to your doctor, pharmacist, or health care provider.  2014, [employer] Standard. (2007-07-09 13:53:16)   Cyclophosphamide injection What is this medicine? CYCLOPHOSPHAMIDE (sye kloe FOSS fa mide) is a chemotherapy drug. It slows the growth of cancer cells. This medicine is used to treat many types of cancer like lymphoma, myeloma, leukemia, breast cancer, and ovarian cancer, to name a few. This medicine may be used for other purposes; ask your health care provider or pharmacist if you have questions. COMMON BRAND NAME(S): Cytoxan, Neosar What should I tell my health care provider before I take this medicine? They need to know if you have any of these conditions: -blood disorders -history of other chemotherapy -infection -kidney disease -liver disease -recent or ongoing radiation therapy -tumors in the bone marrow -an unusual or allergic reaction to cyclophosphamide, other chemotherapy, other medicines, foods, dyes, or preservatives -pregnant or trying to get pregnant -breast-feeding How should I use this medicine? This drug is  usually given as an injection into a vein or muscle or by infusion into a vein. It is administered in a hospital or clinic by a specially trained health care professional. Talk to your pediatrician regarding the use of this medicine in children. Special care may be needed. Overdosage: If you think you have taken too much of this medicine contact a poison  control center or emergency room at once. NOTE: This medicine is only for you. Do not share this medicine with others. What if I miss a dose? It is important not to miss your dose. Call your doctor or health care professional if you are unable to keep an appointment. What may interact with this medicine? This medicine may interact with the following medications: -amiodarone -amphotericin B -azathioprine -certain antiviral medicines for HIV or AIDS such as protease inhibitors (e.g., indinavir, ritonavir) and zidovudine -certain blood pressure medications such as benazepril, captopril, enalapril, fosinopril, lisinopril, moexipril, monopril, perindopril, quinapril, ramipril, trandolapril -certain cancer medications such as anthracyclines (e.g., daunorubicin, doxorubicin), busulfan, cytarabine, paclitaxel, pentostatin, tamoxifen, trastuzumab -certain diuretics such as chlorothiazide, chlorthalidone, hydrochlorothiazide, indapamide, metolazone -certain medicines that treat or prevent blood clots like warfarin -certain muscle relaxants such as succinylcholine -cyclosporine -etanercept -indomethacin -medicines to increase blood counts like filgrastim, pegfilgrastim, sargramostim -medicines used as general anesthesia -metronidazole -natalizumab This list may not describe all possible interactions. Give your health care provider a list of all the medicines, herbs, non-prescription drugs, or dietary supplements you use. Also tell them if you smoke, drink alcohol, or use illegal drugs. Some items may interact with your medicine. What should I watch for while using this medicine? Visit your doctor for checks on your progress. This drug may make you feel generally unwell. This is not uncommon, as chemotherapy can affect healthy cells as well as cancer cells. Report any side effects. Continue your course of treatment even though you feel ill unless your doctor tells you to stop. Drink water or other  fluids as directed. Urinate often, even at night. In some cases, you may be given additional medicines to help with side effects. Follow all directions for their use. Call your doctor or health care professional for advice if you get a fever, chills or sore throat, or other symptoms of a cold or flu. Do not treat yourself. This drug decreases your body's ability to fight infections. Try to avoid being around people who are sick. This medicine may increase your risk to bruise or bleed. Call your doctor or health care professional if you notice any unusual bleeding. Be careful brushing and flossing your teeth or using a toothpick because you may get an infection or bleed more easily. If you have any dental work done, tell your dentist you are receiving this medicine. You may get drowsy or dizzy. Do not drive, use machinery, or do anything that needs mental alertness until you know how this medicine affects you. Do not become pregnant while taking this medicine or for 1 year after stopping it. Women should inform their doctor if they wish to become pregnant or think they might be pregnant. Men should not father a child while taking this medicine and for 4 months after stopping it. There is a potential for serious side effects to an unborn child. Talk to your health care professional or pharmacist for more information. Do not breast-feed an infant while taking this medicine. This medicine may interfere with the ability to have a child. This medicine has caused ovarian failure in some women. This medicine  has caused reduced sperm counts in some men. You should talk with your doctor or health care professional if you are concerned about your fertility. If you are going to have surgery, tell your doctor or health care professional that you have taken this medicine. What side effects may I notice from receiving this medicine? Side effects that you should report to your doctor or health care professional as soon as  possible: -allergic reactions like skin rash, itching or hives, swelling of the face, lips, or tongue -low blood counts - this medicine may decrease the number of white blood cells, red blood cells and platelets. You may be at increased risk for infections and bleeding. -signs of infection - fever or chills, cough, sore throat, pain or difficulty passing urine -signs of decreased platelets or bleeding - bruising, pinpoint red spots on the skin, black, tarry stools, blood in the urine -signs of decreased red blood cells - unusually weak or tired, fainting spells, lightheadedness -breathing problems -dark urine -dizziness -palpitations -swelling of the ankles, feet, hands -trouble passing urine or change in the amount of urine -weight gain -yellowing of the eyes or skin Side effects that usually do not require medical attention (report to your doctor or health care professional if they continue or are bothersome): -changes in nail or skin color -hair loss -missed menstrual periods -mouth sores -nausea, vomiting This list may not describe all possible side effects. Call your doctor for medical advice about side effects. You may report side effects to FDA at 1-800-FDA-1088. Where should I keep my medicine? This drug is given in a hospital or clinic and will not be stored at home. NOTE: This sheet is a summary. It may not cover all possible information. If you have questions about this medicine, talk to your doctor, pharmacist, or health care provider.  2014, [employer] Standard. (2012-01-18 16:22:58) Vincristine injection What is this medicine? VINCRISTINE (vin KRIS teen) is a chemotherapy drug. It slows the growth of cancer cells. This medicine is used to treat many types of cancer like Hodgkin's disease, leukemia, non-Hodgkin's lymphoma, neuroblastoma (brain cancer), rhabdomyosarcoma, and Wilms' tumor. This medicine may be used for other purposes; ask your health care provider or  pharmacist if you have questions. COMMON BRAND NAME(S): Oncovin, Vincasar PFS What should I tell my health care provider before I take this medicine? They need to know if you have any of these conditions: -blood disorders -gout -infection (especially chickenpox, cold sores, or herpes) -kidney disease -liver disease -lung disease -nervous system disease like Charcot-Marie-Tooth (CMT) -recent or ongoing radiation therapy -an unusual or allergic reaction to vincristine, other chemotherapy agents, other medicines, foods, dyes, or preservatives -pregnant or trying to get pregnant -breast-feeding How should I use this medicine? This drug is given as an infusion into a vein. It is administered in a hospital or clinic by a specially trained health care professional. If you have pain, swelling, burning, or any unusual feeling around the site of your injection, tell your health care professional right away. Talk to your pediatrician regarding the use of this medicine in children. While this drug may be prescribed for selected conditions, precautions do apply. Overdosage: If you think you have taken too much of this medicine contact a poison control center or emergency room at once. NOTE: This medicine is only for you. Do not share this medicine with others. What if I miss a dose? It is important not to miss your dose. Call your doctor or health care professional if you  are unable to keep an appointment. What may interact with this medicine? Do not take this medicine with any of the following medications: -itraconazole -mibefradil -voriconazole This medicine may also interact with the following medications: -cyclosporine -erythromycin -fluconazole -ketoconazole -medicines for HIV like delavirdine, efavirenz, nevirapine -medicines for seizures like ethotoin, fosphenotoin, phenytoin -medicines to increase blood counts like filgrastim, pegfilgrastim, sargramostim -other chemotherapy drugs like  cisplatin, L-asparaginase, methotrexate, mitomycin, paclitaxel -pegaspargase -vaccines -zalcitabine, ddC Talk to your doctor or health care professional before taking any of these medicines: -acetaminophen -aspirin -ibuprofen -ketoprofen -naproxen This list may not describe all possible interactions. Give your health care provider a list of all the medicines, herbs, non-prescription drugs, or dietary supplements you use. Also tell them if you smoke, drink alcohol, or use illegal drugs. Some items may interact with your medicine. What should I watch for while using this medicine? Your condition will be monitored carefully while you are receiving this medicine. You will need important blood work done while you are taking this medicine. This drug may make you feel generally unwell. This is not uncommon, as chemotherapy can affect healthy cells as well as cancer cells. Report any side effects. Continue your course of treatment even though you feel ill unless your doctor tells you to stop. In some cases, you may be given additional medicines to help with side effects. Follow all directions for their use. Call your doctor or health care professional for advice if you get a fever, chills or sore throat, or other symptoms of a cold or flu. Do not treat yourself. Avoid taking products that contain aspirin, acetaminophen, ibuprofen, naproxen, or ketoprofen unless instructed by your doctor. These medicines may hide a fever. Do not become pregnant while taking this medicine. Women should inform their doctor if they wish to become pregnant or think they might be pregnant. There is a potential for serious side effects to an unborn child. Talk to your health care professional or pharmacist for more information. Do not breast-feed an infant while taking this medicine. Men may have a lower sperm count while taking this medicine. Talk to your doctor if you plan to father a child. What side effects may I notice from  receiving this medicine? Side effects that you should report to your doctor or health care professional as soon as possible: -allergic reactions like skin rash, itching or hives, swelling of the face, lips, or tongue -breathing problems -confusion or changes in emotions or moods -constipation -cough -mouth sores -muscle weakness -nausea and vomiting -pain, swelling, redness or irritation at the injection site -pain, tingling, numbness in the hands or feet -problems with balance, talking, walking -seizures -stomach pain -trouble passing urine or change in the amount of urine Side effects that usually do not require medical attention (report to your doctor or health care professional if they continue or are bothersome): -diarrhea -hair loss -jaw pain -loss of appetite This list may not describe all possible side effects. Call your doctor for medical advice about side effects. You may report side effects to FDA at 1-800-FDA-1088. Where should I keep my medicine? This drug is given in a hospital or clinic and will not be stored at home. NOTE: This sheet is a summary. It may not cover all possible information. If you have questions about this medicine, talk to your doctor, pharmacist, or health care provider.  2014, [employer]. (2007-12-01 17:17:13)  Cyclophosphamide injection What is this medicine? CYCLOPHOSPHAMIDE (sye kloe FOSS fa mide) is a chemotherapy drug. It slows  the growth of cancer cells. This medicine is used to treat many types of cancer like lymphoma, myeloma, leukemia, breast cancer, and ovarian cancer, to name a few. This medicine may be used for other purposes; ask your health care provider or pharmacist if you have questions. COMMON BRAND NAME(S): Cytoxan, Neosar What should I tell my health care provider before I take this medicine? They need to know if you have any of these conditions: -blood disorders -history of other chemotherapy -infection -kidney  disease -liver disease -recent or ongoing radiation therapy -tumors in the bone marrow -an unusual or allergic reaction to cyclophosphamide, other chemotherapy, other medicines, foods, dyes, or preservatives -pregnant or trying to get pregnant -breast-feeding How should I use this medicine? This drug is usually given as an injection into a vein or muscle or by infusion into a vein. It is administered in a hospital or clinic by a specially trained health care professional. Talk to your pediatrician regarding the use of this medicine in children. Special care may be needed. Overdosage: If you think you have taken too much of this medicine contact a poison control center or emergency room at once. NOTE: This medicine is only for you. Do not share this medicine with others. What if I miss a dose? It is important not to miss your dose. Call your doctor or health care professional if you are unable to keep an appointment. What may interact with this medicine? This medicine may interact with the following medications: -amiodarone -amphotericin B -azathioprine -certain antiviral medicines for HIV or AIDS such as protease inhibitors (e.g., indinavir, ritonavir) and zidovudine -certain blood pressure medications such as benazepril, captopril, enalapril, fosinopril, lisinopril, moexipril, monopril, perindopril, quinapril, ramipril, trandolapril -certain cancer medications such as anthracyclines (e.g., daunorubicin, doxorubicin), busulfan, cytarabine, paclitaxel, pentostatin, tamoxifen, trastuzumab -certain diuretics such as chlorothiazide, chlorthalidone, hydrochlorothiazide, indapamide, metolazone -certain medicines that treat or prevent blood clots like warfarin -certain muscle relaxants such as succinylcholine -cyclosporine -etanercept -indomethacin -medicines to increase blood counts like filgrastim, pegfilgrastim, sargramostim -medicines used as general  anesthesia -metronidazole -natalizumab This list may not describe all possible interactions. Give your health care provider a list of all the medicines, herbs, non-prescription drugs, or dietary supplements you use. Also tell them if you smoke, drink alcohol, or use illegal drugs. Some items may interact with your medicine. What should I watch for while using this medicine? Visit your doctor for checks on your progress. This drug may make you feel generally unwell. This is not uncommon, as chemotherapy can affect healthy cells as well as cancer cells. Report any side effects. Continue your course of treatment even though you feel ill unless your doctor tells you to stop. Drink water or other fluids as directed. Urinate often, even at night. In some cases, you may be given additional medicines to help with side effects. Follow all directions for their use. Call your doctor or health care professional for advice if you get a fever, chills or sore throat, or other symptoms of a cold or flu. Do not treat yourself. This drug decreases your body's ability to fight infections. Try to avoid being around people who are sick. This medicine may increase your risk to bruise or bleed. Call your doctor or health care professional if you notice any unusual bleeding. Be careful brushing and flossing your teeth or using a toothpick because you may get an infection or bleed more easily. If you have any dental work done, tell your dentist you are receiving this medicine. You  may get drowsy or dizzy. Do not drive, use machinery, or do anything that needs mental alertness until you know how this medicine affects you. Do not become pregnant while taking this medicine or for 1 year after stopping it. Women should inform their doctor if they wish to become pregnant or think they might be pregnant. Men should not father a child while taking this medicine and for 4 months after stopping it. There is a potential for serious side  effects to an unborn child. Talk to your health care professional or pharmacist for more information. Do not breast-feed an infant while taking this medicine. This medicine may interfere with the ability to have a child. This medicine has caused ovarian failure in some women. This medicine has caused reduced sperm counts in some men. You should talk with your doctor or health care professional if you are concerned about your fertility. If you are going to have surgery, tell your doctor or health care professional that you have taken this medicine. What side effects may I notice from receiving this medicine? Side effects that you should report to your doctor or health care professional as soon as possible: -allergic reactions like skin rash, itching or hives, swelling of the face, lips, or tongue -low blood counts - this medicine may decrease the number of white blood cells, red blood cells and platelets. You may be at increased risk for infections and bleeding. -signs of infection - fever or chills, cough, sore throat, pain or difficulty passing urine -signs of decreased platelets or bleeding - bruising, pinpoint red spots on the skin, black, tarry stools, blood in the urine -signs of decreased red blood cells - unusually weak or tired, fainting spells, lightheadedness -breathing problems -dark urine -dizziness -palpitations -swelling of the ankles, feet, hands -trouble passing urine or change in the amount of urine -weight gain -yellowing of the eyes or skin Side effects that usually do not require medical attention (report to your doctor or health care professional if they continue or are bothersome): -changes in nail or skin color -hair loss -missed menstrual periods -mouth sores -nausea, vomiting This list may not describe all possible side effects. Call your doctor for medical advice about side effects. You may report side effects to FDA at 1-800-FDA-1088. Where should I keep my  medicine? This drug is given in a hospital or clinic and will not be stored at home. NOTE: This sheet is a summary. It may not cover all possible information. If you have questions about this medicine, talk to your doctor, pharmacist, or health care provider.  2014, [employer]. (2012-01-18 16:22:58)

## 2013-01-23 NOTE — Progress Notes (Signed)
OFFICE PROGRESS NOTE  CC  [name] [address] [place] [address] Dr. Chevis Pretty  Dr. 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 Patient was seen in the multidisciplinary breast clinic for discussion of treatment options.   STAGE:  Cancer of lower-inner quadrant of female breast  Primary site: Breast (Left)  Staging method: AJCC 7th Edition  Clinical: Stage IA (T1c, N0, cM0)  Summary: Stage IA (T1c, N0, cM0)   PRIOR THERAPY: #1Patient had a screening mammogram in [place] on 09/23/2012 she was felt to have a new mass within the left breast. She had ultrasound on 10/06/2012 which showed a 1.6 x 1.3 x 1.3 cm mass at the 8:00 position 7 cm from the nipple. At the 12:00 position behind and nipple was circumscribed oval shaped 2 x 1.8 x 1.2 cm lesion perhaps representing a fibroadenoma. Ultrasound-guided biopsies were performed on 10/06/2012 that revealed fibrocystic changes at the 12:00 and invasive ductal carcinoma at the 8:00 position. The carcinoma was grade 2-3 with a Ki-67 25% HER-2/neu negative. ER positive PR positive at 80%. Her pathology from [facility] was reviewed by Dr. Colonel Bald. Patient did have blood work performed for genetic testing in [place] day prior to her visit today. However she has not been seen formally by genetic counselor. Patient had MRIs of the breasts performed   #2 patient is now status post lumpectomy with sentinel lymph node biopsy with the final pathology revealing a 2.3 cm invasive ductal carcinoma, grade 3 one sentinel node had microscopic disease. Tumor was ER positive PR positive with pleural effusion marker Ki-67 25%. Her final pathologic staging is T2 N1 microscopic.  #3 patient did have genetic testing performed in [place] and reportedly it is negative for BRCA1 and BRCA2  #4 Patient will start FEC on 01/23/13. Initially 6 cycles of FEC is  planned. This will then be followed by Taxol weekly x12 weeks  CURRENT THERAPY: Curative intent cycle 1 day 1 of FEC today  INTERVAL HISTORY: [name] 37 y.o. female returns for followup visit. Clinically she seems to be doing well. She is accompanied by a friend of hers. She is anxious but she did take some Xanax today and she feels much better. She is denying any nausea vomiting fevers chills night sweats headaches no shortness of breath chest pains palpitations. Surgical site of the left breast looks really well healed she has minimal tenderness no erythema no other skin changes. Remainder of the 10 point review of systems is negative.  MEDICAL HISTORY: Past Medical History  Diagnosis Date  . Urinary incontinence     occasional  . Family history of anesthesia complication     pt's mother has hx. of post-op N/V  . Hypertension     under control with med., has been on med. since 09/2012  . Anxiety   . Breast cancer     left    ALLERGIES:  is allergic to amoxicillin.  MEDICATIONS:  Current Outpatient Prescriptions  Medication Sig Dispense Refill  . ALPRAZolam (XANAX) 0.25 MG tablet Take 1 tablet (0.25 mg total) by mouth at bedtime as needed for sleep.  30 tablet  2  . lidocaine-prilocaine (EMLA) cream Apply topically as needed.  30 g  0  . lisinopril (PRINIVIL,ZESTRIL) 10 MG tablet Take 10 mg by mouth daily.      Marland Kitchen dexamethasone (DECADRON) 4 MG tablet Take 2 tablets by mouth once  a day on the day after chemotherapy and then take 2 tablets two times a day for 2 days. Take with food.  30 tablet  1  . HYDROcodone-acetaminophen (NORCO/VICODIN) 5-325 MG per tablet Take 1-2 tablets by mouth every 4 (four) hours as needed for pain.  50 tablet  1  . LORazepam (ATIVAN) 0.5 MG tablet Take 1 tablet (0.5 mg total) by mouth every 6 (six) hours as needed (Nausea or vomiting).  30 tablet  0  . ondansetron (ZOFRAN) 8 MG tablet Take 1 tablet (8 mg total) by mouth 2 (two) times daily as needed.  Take two times a day as needed for nausea or vomiting starting on the third day after chemotherapy.  30 tablet  1  . prochlorperazine (COMPAZINE) 10 MG tablet Take 1 tablet (10 mg total) by mouth every 6 (six) hours as needed (Nausea or vomiting).  30 tablet  1   No current facility-administered medications for this visit.    SURGICAL HISTORY:  Past Surgical History  Procedure Laterality Date  . Cesarean section  1999  . Breast lumpectomy with needle localization and axillary sentinel lymph node bx Left 12/10/2012    Procedure: BREAST LUMPECTOMY WITH NEEDLE LOCALIZATION AND AXILLARY SENTINEL LYMPH NODE BX;  Surgeon: Robyne Askew, MD;  Location: [facility] OR;  Service: General;  Laterality: Left;  Needle loc BCG 7:30  nuc med 9:30    . Tonsillectomy and adenoidectomy      as a teenager  . Cholecystectomy  1999  . Open reduction internal fixation (orif) tibia/fibula fracture Right   . Portacath placement Right 01/09/2013    Procedure: INSERTION PORT-A-CATH;  Surgeon: Robyne Askew, MD;  Location: [facility];  Service: General;  Laterality: Right;    REVIEW OF SYSTEMS:  A 10 point review of systems was conducted and is otherwise negative except for what is noted above.    PHYSICAL EXAMINATION: Blood pressure 141/85, pulse 85, temperature 98.3 F (36.8 C), temperature source Oral, resp. rate 20, height 5\' 8"  (1.727 m), weight 255 lb 4.8 oz (115.803 kg), last menstrual period 12/28/2012. Body mass index is 38.83 kg/(m^2). General: Patient is a well appearing female in no acute distress HEENT: PERRLA, sclerae anicteric no conjunctival pallor, MMM Neck: supple, no palpable adenopathy, port placed in right upper chest wall, right neck slightly tender and has minimal swelling Lungs: clear to auscultation bilaterally, no wheezes, rhonchi, or rales Cardiovascular: regular rate rhythm, S1, S2, no murmurs, rubs or gallops Abdomen: Soft, non-tender, non-distended, normoactive bowel  sounds, no HSM Extremities: warm and well perfused, no clubbing, cyanosis, or edema Skin: No rashes or lesions Neuro: Non-focal ECOG PERFORMANCE STATUS: 0 - Asymptomatic      LABORATORY DATA: Lab Results  Component Value Date   WBC 10.5* 01/23/2013   HGB 13.0 01/23/2013   HCT 37.8 01/23/2013   MCV 91.3 01/23/2013   PLT 235 01/23/2013      Chemistry      Component Value Date/Time   NA 139 01/09/2013 1255   NA 137 10/22/2012 1217   K 4.1 01/09/2013 1255   K 3.5 10/22/2012 1217   CL 105 01/09/2013 1255   CO2 26 12/03/2012 1409   CO2 25 10/22/2012 1217   BUN 11 01/09/2013 1255   BUN 12.7 10/22/2012 1217   CREATININE 0.80 01/09/2013 1255   CREATININE 0.8 10/22/2012 1217      Component Value Date/Time   CALCIUM 8.8 12/03/2012 1409   CALCIUM 9.6 10/22/2012  1217   ALKPHOS 74 10/22/2012 1217   AST 18 10/22/2012 1217   ALT 20 10/22/2012 1217   BILITOT 0.69 10/22/2012 1217     ADDITIONAL INFORMATION: 3. CHROMOGENIC IN-SITU HYBRIDIZATION Results: HER-2/NEU BY [facility] - NO AMPLIFICATION OF HER-2 DETECTED. RESULT RATIO OF HER2: CEP 17 SIGNALS 1.75 AVERAGE HER2 COPY NUMBER PER CELL 2.45 REFERENCE RANGE NEGATIVE HER2/Chr17 Ratio <2.0 and Average HER2 copy number <4.0 EQUIVOCAL HER2/Chr17 Ratio <2.0 and Average HER2 copy number 4.0 and <6.0 POSITIVE HER2/Chr17 Ratio >=2.0 and/or Average HER2 copy number >=6.0 Abigail Miyamoto MD Pathologist, Electronic Signature ( Signed 12/17/2012) FINAL DIAGNOSIS Diagnosis 1. Lymph node, sentinel, biopsy, left - ONE LYMPH NODE POSITIVE FOR MICROMETASTATIC DUCTAL CARCINOMA (1/1). 2. Lymph node, sentinel, biopsy, left - ONE BENIGN LYMPH NODE WITH NO TUMOR SEEN (0/1). 3. Breast, lumpectomy, left - INVASIVE GRADE III DUCTAL CARCINOMA SPANNING 2.3 CM IN GREATEST DIMENSION. - ASSOCIATED INTERMEDIATE GRADE DUCTAL CARCINOMA IN SITU WITH NECROSIS. - LYMPH/VASCULAR INVASION IS IDENTIFIED. - MARGINS ARE NEGATIVE. 1 of 3 FINAL for YARETSI, HUMPHRES  ([id number]) Diagnosis(continued) - SEE ONCOLOGY TEMPLATE 4. Breast, excision, left - BENIGN BREAST PARENCHYMA. - NO TUMOR SEEN. Microscopic Comment 3. BREAST, INVASIVE TUMOR, WITH LYMPH NODE SAMPLING Specimen, including laterality and lymph node sampling (sentinel, non-sentinel): Left partial breast with additional left excision and sentinel lymph node sampling. Procedure: Left breast lumpectomy with additional left breast excision and sentinel lymph node biopsy. Histologic type: Ductal carcinoma. Grade: III. Tubule formation: 3. Nuclear pleomorphism: 2. Mitotic: 3. Tumor size (gross measurement): 2.3 cm. Margins: Invasive, distance to closest margin: 0.4 cm (superior margin). In-situ, distance to closest margin: At least 0.4 cm (superior margin). Lymphovascular invasion: Identified. Ductal carcinoma in situ: Yes. Grade: Intermediate grade. Extensive intraductal component: No. Lobular neoplasia: No. Tumor focality: Unifocal. Treatment effect: N/A. Extent of tumor: Tumor confined to breast. Skin: Not involved. Nipple: Not received. Skeletal muscle: Not received. Lymph nodes: Examined: 2 Sentinel. 0 Non-sentinel. 2 Total. Lymph nodes with metastasis: 1. Micrometastasis: (> 0.2 mm and < 2.0 mm): 1. Extracapsular extension: Not identified. Breast prognostic profile: Reported on previous case per outside slides ([id number]). Estrogen receptor: Approximately 80%. Progesterone receptor: Approximately 80%. Her 2 neu: Not amplified. Ki-67: 25%. Non-neoplastic breast: Fibrocystic changes, benign skin present. TNM: pT2, pN38mi, MX. Comments: As Her-2 neu by [facility] was previously negative (not amplified), it will be repeated on the invasive tumor and reported in an addendum.  RADIOGRAPHIC STUDIES:  Ct Chest W Contrast  01/02/2013   CLINICAL DATA:  Breast cancer. Evaluate for metastatic disease.  EXAM: CT CHEST, ABDOMEN, AND PELVIS WITH CONTRAST  TECHNIQUE: Multidetector CT  imaging of the chest, abdomen and pelvis was performed following the standard protocol during bolus administration of intravenous contrast.  CONTRAST:  OMNIPAQUE IOHEXOL 300 MG/ML  SOLN  COMPARISON:  Breast MR 10/22/2012.  FINDINGS: CT CHEST FINDINGS  9 mm low-attenuation lesion in the left lobe of the thyroid. No pathologically enlarged mediastinal, hilar, right axillary or internal mammary lymph nodes. Postoperative changes and a small fluid collection, measuring 2.1 x 2.5 cm, are seen in the left axilla. Largest left axillary lymph node measures 10 mm (image 18). Postoperative changes are seen in the left breast, including a 3.2 x 6.3 cm fluid collection medially. Heart is at the upper limits of normal in size. No pericardial effusion.  There are a few scattered tiny pulmonary nodular densities, measuring up to 3 mm in the left upper lobe (image 20). No pleural fluid. Airway is unremarkable.  CT ABDOMEN AND PELVIS FINDINGS  Liver is decreased in attenuation diffusely with two hyper attenuating lesions in the dome of the liver, measuring up to 2.1 cm (image 44). Cholecystectomy. Adrenal glands and right kidney are unremarkable. Tiny stone in the lower pole left kidney. Low-attenuation lesions in the left kidney measure up to 11 mm, and are likely cysts. Spleen, pancreas, stomach and colon are unremarkable.  Uterus and ovaries are visualized. There may be small fibroids within the uterus, creating small contour abnormalities. No pathologically enlarged lymph nodes. No free fluid. No worrisome lytic or sclerotic lesions.  IMPRESSION: 1. Postoperative changes in the left breast and left axilla without evidence of intrathoracic metastatic disease. 2. Hepatic steatosis with two hyper attenuating lesions in the dome of the liver. While these may represent hemangiomas, metastatic disease cannot be definitively excluded. MR abdomen without and with contrast could be performed in further evaluation, as clinically  indicated. 3. Few scattered tiny pulmonary nodular densities, nonspecific. While a benign etiology is favored, continued attention on followup exams is warranted. 4. Tiny left renal stone.   Electronically Signed   By: Leanna Battles M.D.   On: 01/02/2013 09:32   Ct Abdomen Pelvis W Contrast  01/02/2013   CLINICAL DATA:  Breast cancer. Evaluate for metastatic disease.  EXAM: CT CHEST, ABDOMEN, AND PELVIS WITH CONTRAST  TECHNIQUE: Multidetector CT imaging of the chest, abdomen and pelvis was performed following the standard protocol during bolus administration of intravenous contrast.  CONTRAST:  OMNIPAQUE IOHEXOL 300 MG/ML  SOLN  COMPARISON:  Breast MR 10/22/2012.  FINDINGS: CT CHEST FINDINGS  9 mm low-attenuation lesion in the left lobe of the thyroid. No pathologically enlarged mediastinal, hilar, right axillary or internal mammary lymph nodes. Postoperative changes and a small fluid collection, measuring 2.1 x 2.5 cm, are seen in the left axilla. Largest left axillary lymph node measures 10 mm (image 18). Postoperative changes are seen in the left breast, including a 3.2 x 6.3 cm fluid collection medially. Heart is at the upper limits of normal in size. No pericardial effusion.  There are a few scattered tiny pulmonary nodular densities, measuring up to 3 mm in the left upper lobe (image 20). No pleural fluid. Airway is unremarkable.  CT ABDOMEN AND PELVIS FINDINGS  Liver is decreased in attenuation diffusely with two hyper attenuating lesions in the dome of the liver, measuring up to 2.1 cm (image 44). Cholecystectomy. Adrenal glands and right kidney are unremarkable. Tiny stone in the lower pole left kidney. Low-attenuation lesions in the left kidney measure up to 11 mm, and are likely cysts. Spleen, pancreas, stomach and colon are unremarkable.  Uterus and ovaries are visualized. There may be small fibroids within the uterus, creating small contour abnormalities. No pathologically enlarged lymph  nodes. No free fluid. No worrisome lytic or sclerotic lesions.  IMPRESSION: 1. Postoperative changes in the left breast and left axilla without evidence of intrathoracic metastatic disease. 2. Hepatic steatosis with two hyper attenuating lesions in the dome of the liver. While these may represent hemangiomas, metastatic disease cannot be definitively excluded. MR abdomen without and with contrast could be performed in further evaluation, as clinically indicated. 3. Few scattered tiny pulmonary nodular densities, nonspecific. While a benign etiology is favored, continued attention on followup exams is warranted. 4. Tiny left renal stone.   Electronically Signed   By: Leanna Battles M.D.   On: 01/02/2013 09:32   Nm Sentinel Node Inj-no Rpt (breast)  12/10/2012   CLINICAL DATA:  left breast cancer   Sulfur colloid was injected intradermally by the nuclear medicine  technologist for breast cancer sentinel node localization.    [employer] [facility] Breast Loc Dev   1st Lesion  Inc US Guide  12/10/2012   *RADIOLOGY REPORT*  Clinical Data:  Known left breast carcinoma.  Preoperative localization.  NEEDLE LOCALIZATION USING ULTRASOUND GUIDANCE AND SPECIMEN RADIOGRAPH  Comparison: Previous exams.  Patient presents for needle localization prior to surgical excision of the left breast mass.  I met with the patient and we discussed the procedure of needle localization including benefits and alternatives. We discussed the high likelihood of a successful procedure. We discussed the risks of the procedure, including infection, bleeding, tissue injury, and further surgery. Informed, written consent was given. The usual time-out protocol was performed immediately prior to the procedure.  Using ultrasound guidance, sterile technique, 2% lidocaine and a 7 cm Ultra wire  the left breast mass was localized using an inferior/medial approach.  The films are marked for Dr. Carolynne Edouard.  Specimen radiograph is performed at surgery, and confirms the  mass, clip, and intact wire to be present in the tissue sample.  The specimen is marked for pathology.  IMPRESSION: Needle localization of the left breast as discussed above.  No apparent complications.   Original Report Authenticated By: Rolla Plate, M.D.    ASSESSMENT: 37 year old female with  #1 stage II invasive ductal carcinoma, grade 3 ER positive PR positive HER-2/neu negative with an elevated Ki-67. Patient is status post lumpectomy of the left breast with 1 of 2 sentinel nodes revealing microscopic disease. This was discussed and she will not receive axillary lymph node dissection and will undergo radiation following the completion of chemotherapy.    #2She will receive 5-FU, epirubicin, Cytoxan with Neulasta support to be given every 2 weeks for a total of 6 cycles followed by Taxol on a weekly basis for 12 weeks. She underwent port placement by Dr. Carolynne Edouard on 01/09/13, and echo and chemotherapy class on 01/12/13.  She will begin chemotherapy on 01/23/13.     PLAN:   #1 patient will proceed with cycle 1 day 1 of FEC today. She will return tomorrow for day 2 Neulasta  # Patient had an echocardiogram performed and reveals an EF between 50 and 55%. We most likely will need another echocardiogram after 3 cycles of FEC.  #3 patient and I discussed how to take her antiemetics today. She will also take Tylenol and Claritin tomorrow prior to the Neulasta injection.  #4 she will be seen back for interim lapse in followup in one week's time  All questions were answered. The patient knows to call the clinic with any problems, questions or concerns. We can certainly see the patient much sooner if necessary.The length of time of the face-to-face encounter was 25    minutes. More than 50% of time was spent counseling and coordination of care.   Drue Second, MD Medical/Oncology [facility] [phone] (beeper) [phone] (Office)  01/23/2013, 2:37 PM

## 2013-01-24 ENCOUNTER — Ambulatory Visit (HOSPITAL_BASED_OUTPATIENT_CLINIC_OR_DEPARTMENT_OTHER): Payer: BC Managed Care – PPO

## 2013-01-24 VITALS — BP 160/76 | HR 88 | Temp 97.0°F

## 2013-01-24 DIAGNOSIS — C50319 Malignant neoplasm of lower-inner quadrant of unspecified female breast: Secondary | ICD-10-CM

## 2013-01-24 DIAGNOSIS — C50312 Malignant neoplasm of lower-inner quadrant of left female breast: Secondary | ICD-10-CM

## 2013-01-24 DIAGNOSIS — Z5189 Encounter for other specified aftercare: Secondary | ICD-10-CM

## 2013-01-24 MED ORDER — PEGFILGRASTIM INJECTION 6 MG/0.6ML
6.0000 mg | Freq: Once | SUBCUTANEOUS | Status: AC
  Administered 2013-01-24: 6 mg via SUBCUTANEOUS

## 2013-01-24 NOTE — Patient Instructions (Signed)

## 2013-01-27 ENCOUNTER — Telehealth: Payer: Self-pay | Admitting: *Deleted

## 2013-01-27 DIAGNOSIS — C50312 Malignant neoplasm of lower-inner quadrant of left female breast: Secondary | ICD-10-CM

## 2013-01-27 MED ORDER — OMEPRAZOLE 20 MG PO CPDR: 20.0000 mg | DELAYED_RELEASE_CAPSULE | Freq: Every day | ORAL | Status: DC

## 2013-01-27 NOTE — Telephone Encounter (Signed)
THE BURNING IN HER THROAT STARTED Friday BUT BECAME WORSE YESTERDAY. PT. IS EATING AND FORCING FLUIDS. NO NAUSEA OR VOMITING. SHE HAS HAD A GOOD BOWEL MOVEMENT IN THE PAST THREE DAYS. NO PROBLEMS WITH HER MOUTH. THIS INFORMATION TO DR.KHAN'S NURSE, Garald Braver.

## 2013-01-27 NOTE — Telephone Encounter (Signed)
NOTIFIED PT. THAT MEDICATION CALL TO PHARMACY.

## 2013-01-29 ENCOUNTER — Other Ambulatory Visit: Payer: Self-pay | Admitting: Emergency Medicine

## 2013-01-29 DIAGNOSIS — C50319 Malignant neoplasm of lower-inner quadrant of unspecified female breast: Secondary | ICD-10-CM

## 2013-01-30 ENCOUNTER — Telehealth: Payer: Self-pay | Admitting: Oncology

## 2013-01-30 ENCOUNTER — Ambulatory Visit (HOSPITAL_BASED_OUTPATIENT_CLINIC_OR_DEPARTMENT_OTHER): Payer: BC Managed Care – PPO | Admitting: Family

## 2013-01-30 ENCOUNTER — Encounter: Payer: Self-pay | Admitting: Family

## 2013-01-30 ENCOUNTER — Other Ambulatory Visit (HOSPITAL_BASED_OUTPATIENT_CLINIC_OR_DEPARTMENT_OTHER): Payer: BC Managed Care – PPO | Admitting: Lab

## 2013-01-30 ENCOUNTER — Ambulatory Visit (INDEPENDENT_AMBULATORY_CARE_PROVIDER_SITE_OTHER): Payer: BC Managed Care – PPO | Admitting: General Surgery

## 2013-01-30 ENCOUNTER — Encounter (INDEPENDENT_AMBULATORY_CARE_PROVIDER_SITE_OTHER): Payer: Self-pay | Admitting: General Surgery

## 2013-01-30 VITALS — BP 131/83 | HR 109 | Temp 98.4°F | Resp 20 | Ht 68.0 in | Wt 249.3 lb

## 2013-01-30 VITALS — BP 128/78 | HR 72 | Temp 97.6°F | Resp 14 | Ht 69.0 in | Wt 252.6 lb

## 2013-01-30 DIAGNOSIS — D702 Other drug-induced agranulocytosis: Secondary | ICD-10-CM

## 2013-01-30 DIAGNOSIS — C50319 Malignant neoplasm of lower-inner quadrant of unspecified female breast: Secondary | ICD-10-CM

## 2013-01-30 DIAGNOSIS — C50312 Malignant neoplasm of lower-inner quadrant of left female breast: Secondary | ICD-10-CM

## 2013-01-30 LAB — CBC WITH DIFFERENTIAL/PLATELET
BASO%: 0 % (ref 0.0–2.0)
Eosinophils Absolute: 0.1 10*3/uL (ref 0.0–0.5)
HCT: 35.9 % (ref 34.8–46.6)
LYMPH%: 90.9 % — ABNORMAL HIGH (ref 14.0–49.7)
MCH: 31 pg (ref 25.1–34.0)
MCHC: 33.7 g/dL (ref 31.5–36.0)
MCV: 92.1 fL (ref 79.5–101.0)
MONO%: 1.5 % (ref 0.0–14.0)
NEUT%: 2.3 % — ABNORMAL LOW (ref 38.4–76.8)
Platelets: 91 10*3/uL — ABNORMAL LOW (ref 145–400)
RBC: 3.9 10*6/uL (ref 3.70–5.45)
WBC: 1.3 10*3/uL — ABNORMAL LOW (ref 3.9–10.3)
lymph#: 1.2 10*3/uL (ref 0.9–3.3)

## 2013-01-30 LAB — COMPREHENSIVE METABOLIC PANEL (CC13)
ALT: 25 U/L (ref 0–55)
Alkaline Phosphatase: 82 U/L (ref 40–150)
Anion Gap: 8 mEq/L (ref 3–11)
BUN: 11.3 mg/dL (ref 7.0–26.0)
CO2: 24 mEq/L (ref 22–29)
Chloride: 103 mEq/L (ref 98–109)
Creatinine: 0.8 mg/dL (ref 0.6–1.1)
Glucose: 136 mg/dl (ref 70–140)
Sodium: 135 mEq/L — ABNORMAL LOW (ref 136–145)
Total Bilirubin: 1.95 mg/dL — ABNORMAL HIGH (ref 0.20–1.20)
Total Protein: 6.2 g/dL — ABNORMAL LOW (ref 6.4–8.3)

## 2013-01-30 MED ORDER — CIPROFLOXACIN HCL 500 MG PO TABS: 500.0000 mg | ORAL_TABLET | Freq: Two times a day (BID) | ORAL | Status: DC

## 2013-01-30 MED ORDER — HYDROCODONE-ACETAMINOPHEN 5-325 MG PO TABS: 1.0000 | ORAL_TABLET | ORAL | Status: DC | PRN

## 2013-01-30 NOTE — Patient Instructions (Signed)
Continue chemo Continue regular self exams 

## 2013-01-30 NOTE — Patient Instructions (Signed)
Please contact us at [phone] if you have any questions or concerns.  Please continue to do well and enjoy life!!!  Get plenty of rest, drink plenty of water, exercise daily (walking), eat a balanced diet.  Take vitamin D3 1000 IUs daily.   Complete monthly self-breast examinations.  Have a clinical breast exam by a physician every year.  Have your mammogram completed every year.  Results for orders placed in visit on 01/30/13 (from the past 24 hour(s))  CBC WITH DIFFERENTIAL     Status: Abnormal   Collection Time    01/30/13  9:45 AM      Result Value Range   WBC 1.3 (*) 3.9 - 10.3 10e3/uL   NEUT# 0.0 (*) 1.5 - 6.5 10e3/uL   HGB 12.1  11.6 - 15.9 g/dL   HCT 57.8  46.9 - 62.9 %   Platelets 91 (*) 145 - 400 10e3/uL   MCV 92.1  79.5 - 101.0 fL   MCH 31.0  25.1 - 34.0 pg   MCHC 33.7  31.5 - 36.0 g/dL   RBC 5.28  4.13 - 2.44 10e6/uL   RDW 13.0  11.2 - 14.5 %   lymph# 1.2  0.9 - 3.3 10e3/uL   MONO# 0.0 (*) 0.1 - 0.9 10e3/uL   Eosinophils Absolute 0.1  0.0 - 0.5 10e3/uL   Basophils Absolute 0.0  0.0 - 0.1 10e3/uL   NEUT% 2.3 (*) 38.4 - 76.8 %   LYMPH% 90.9 (*) 14.0 - 49.7 %   MONO% 1.5  0.0 - 14.0 %   EOS% 5.3  0.0 - 7.0 %   BASO% 0.0  0.0 - 2.0 %   Narrative:    Performed At:  [facility]               [address]               [place], [place] [address]     Neutropenia Neutropenia is a condition that occurs when the level of a certain type of white blood cell (neutrophil) in your body becomes lower than normal. Neutrophils are made in the bone marrow and fight infections. These cells protect against bacteria and viruses. The fewer neutrophils you have, and the longer your body remains without them, the greater your risk of getting a severe infection becomes.  CAUSES  The cause of neutropenia may be hard to determine. However, it is usually due to 3 main problems:   Decreased production of neutrophils. This may be due to:  Certain medicines such as  chemotherapy.  Genetic problems.  Cancer.  Radiation treatments.  Vitamin deficiency.  Some pesticides.  Increased destruction of neutrophils. This may be due to:  Overwhelming infections.  Hemolytic anemia. This is when the body destroys its own blood cells.  Chemotherapy.  Neutrophils moving to areas of the body where they cannot fight infections. This may be due to:  Dialysis procedures.  Conditions where the spleen becomes enlarged. Neutrophils are held in the spleen and are not available to the rest of the body.  Overwhelming infections. The neutrophils are held in the area of the infection and are not available to the rest of the body.  SYMPTOMS  There are no specific symptoms of neutropenia. The lack of neutrophils can result in an infection, and an infection can cause various problems.  DIAGNOSIS  Diagnosis is made by a blood test. A complete blood count is performed. The normal level of neutrophils in human  blood differs with age and race. Infants have lower counts than older children and adults. African Americans have lower counts than Caucasians or Asians. The average adult level is 1500 cells/mm3 of blood. Neutrophil counts are interpreted as follows:  Greater than 1000 cells/mm3 gives normal protection against infection.  500 to 1000 cells/mm3 gives an increased risk for infection.  200 to 500 cells/mm3 is a greater risk for severe infection.  Lower than 200 cells/mm3 is a marked risk of infection. This may require hospitalization and treatment with antibiotic medicines.  TREATMENT  Treatment depends on the underlying cause, severity, and presence of infections or symptoms. It also depends on your health. Your caregiver will discuss the treatment plan with you. Mild cases are often easily treated and have a good outcome. Preventative measures may also be started to limit your risk of infections. Treatment can include:  Taking antibiotics.  Stopping  medicines that are known to cause neutropenia.  Correcting nutritional deficiencies by eating green vegetables to supply folic acid and taking vitamin B supplements.  Stopping exposure to pesticides if your neutropenia is related to pesticide exposure.  Taking a blood growth factor called sargramostim, pegfilgrastim, or filgrastim if you are undergoing chemotherapy for cancer. This stimulates white blood cell production.  Removal of the spleen if you have Felty's syndrome and have repeated infections.  HOME CARE INSTRUCTIONS   Follow your caregiver's instructions about when you need to have blood work done.  Wash your hands often. Make sure others who come in contact with you also wash their hands.  Wash raw fruits and vegetables before eating them. They can carry bacteria and fungi.  Avoid people with colds or spreadable (contagious) diseases (chickenpox, herpes zoster, influenza).  Avoid large crowds.  Avoid construction areas. The dust can release fungus into the air.  Be cautious around children in daycare or school environments.  Take care of your respiratory system by coughing and deep breathing.  Bathe daily.  Protect your skin from cuts and burns.  Do not work in the garden or with flowers and plants.  Care for the mouth before and after meals by brushing with a soft toothbrush. If you have mucositis, do not use mouthwash. Mouthwash contains alcohol and can dry out the mouth even more.  Clean the area between the genitals and the anus (perineal area) after urination and bowel movements. Women need to wipe from front to back.  Use a water soluble lubricant during sexual intercourse and practice good hygiene after. Do not have intercourse if you are severely neutropenic. Check with your caregiver for guidelines.  Exercise daily as tolerated.  Avoid people who were vaccinated with a live vaccine in the past 30 days. You should not receive live vaccines (polio,  typhoid).  Do not provide direct care for pets. Avoid animal droppings. Do not clean litter boxes and bird cages.  Do not share food utensils.  Do not use tampons, enemas, or rectal suppositories unless directed by your caregiver.  Use an electric razor to remove hair.  Wash your hands after handling magazines, letters, and newspapers.  SEEK IMMEDIATE MEDICAL CARE IF:   You have a fever.  You have chills or start to shake.  You feel nauseous or vomit.  You develop mouth sores.  You develop aches and pains.  You have redness and swelling around open wounds.  Your skin is warm to the touch.  You have pus coming from your wounds.  You develop swollen lymph nodes.  You  feel weak or fatigued.  You develop red streaks on the skin.  MAKE SURE YOU:  Understand these instructions.  Will watch your condition.  Will get help right away if you are not doing well or get worse.  Document Released: 08/25/2001 Document Revised: 05/28/2011 Document Reviewed: 09/22/2010 [facility] Patient Information 2014 [place], [place].
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
## 2013-01-30 NOTE — Telephone Encounter (Signed)
, °

## 2013-02-02 ENCOUNTER — Telehealth: Payer: Self-pay | Admitting: *Deleted

## 2013-02-02 NOTE — Telephone Encounter (Signed)
Patient called asking if she should report to work the day influenza vaccines are given to staff.  Informed her she can report to work and to follow neutropenic precautions.  (wash hands well especially after going to the bathroom, use hand sanitizer, avoid people who are sick, no handshakes, hugs or kisses)  "Checks her temperature twice a day and denies fever or signs of infection.  Has been on house arrest.  At work has a sign on her office door that reads do not enter if you are sick or don't feel well.  Taking antibiotics as prescribed."  Informed her the vaccine is not in the air and as long as she doesn't touch anyone in the office, she will be fine.  No further questions.

## 2013-02-03 NOTE — Progress Notes (Signed)
Subjective:     Patient ID: [name], female   DOB: [birth year], 37 y.o.   MRN: [id number]  HPI The patient is a 37 year old white female who is about 2 months status post left breast lumpectomy for a T2 N1 MIC left breast cancer. She also recently had a Port-A-Cath placed. She tolerated this well. She has started chemotherapy and has no complaints today. Her port is working well.  Review of Systems     Objective:   Physical Exam On exam her left breast and axillary incisions are healing nicely with no sign of infection. Her port site looks good.    Assessment:     The patient is 2 months status post left breast lumpectomy for breast cancer     Plan:     At this point she will continue with her chemotherapy treatment. She will continue to do regular self exams. I will plan to see her back in about 6 months.

## 2013-02-06 ENCOUNTER — Other Ambulatory Visit (HOSPITAL_BASED_OUTPATIENT_CLINIC_OR_DEPARTMENT_OTHER): Payer: BC Managed Care – PPO | Admitting: Lab

## 2013-02-06 ENCOUNTER — Telehealth: Payer: Self-pay | Admitting: Oncology

## 2013-02-06 ENCOUNTER — Telehealth: Payer: Self-pay | Admitting: *Deleted

## 2013-02-06 ENCOUNTER — Ambulatory Visit (HOSPITAL_BASED_OUTPATIENT_CLINIC_OR_DEPARTMENT_OTHER): Payer: BC Managed Care – PPO | Admitting: Family

## 2013-02-06 ENCOUNTER — Ambulatory Visit (HOSPITAL_BASED_OUTPATIENT_CLINIC_OR_DEPARTMENT_OTHER): Payer: BC Managed Care – PPO

## 2013-02-06 ENCOUNTER — Encounter: Payer: Self-pay | Admitting: Family

## 2013-02-06 VITALS — BP 137/88 | HR 93 | Temp 98.1°F | Resp 18 | Ht 69.0 in | Wt 248.2 lb

## 2013-02-06 DIAGNOSIS — Z5111 Encounter for antineoplastic chemotherapy: Secondary | ICD-10-CM

## 2013-02-06 DIAGNOSIS — C50312 Malignant neoplasm of lower-inner quadrant of left female breast: Secondary | ICD-10-CM

## 2013-02-06 DIAGNOSIS — C50319 Malignant neoplasm of lower-inner quadrant of unspecified female breast: Secondary | ICD-10-CM

## 2013-02-06 LAB — COMPREHENSIVE METABOLIC PANEL (CC13)
ALT: 47 U/L (ref 0–55)
AST: 35 U/L — ABNORMAL HIGH (ref 5–34)
Albumin: 3.3 g/dL — ABNORMAL LOW (ref 3.5–5.0)
Alkaline Phosphatase: 72 U/L (ref 40–150)
Anion Gap: 9 mEq/L (ref 3–11)
BUN: 11.3 mg/dL (ref 7.0–26.0)
CO2: 24 mEq/L (ref 22–29)
Calcium: 9.3 mg/dL (ref 8.4–10.4)
Chloride: 105 mEq/L (ref 98–109)
Creatinine: 0.7 mg/dL (ref 0.6–1.1)
Glucose: 90 mg/dl (ref 70–140)
Potassium: 4.1 mEq/L (ref 3.5–5.1)
Sodium: 139 mEq/L (ref 136–145)
Total Bilirubin: 0.55 mg/dL (ref 0.20–1.20)
Total Protein: 6.6 g/dL (ref 6.4–8.3)

## 2013-02-06 LAB — CBC WITH DIFFERENTIAL/PLATELET
BASO%: 0.6 % (ref 0.0–2.0)
Basophils Absolute: 0 10*3/uL (ref 0.0–0.1)
EOS%: 0.3 % (ref 0.0–7.0)
Eosinophils Absolute: 0 10*3/uL (ref 0.0–0.5)
HCT: 35.9 % (ref 34.8–46.6)
HGB: 12.2 g/dL (ref 11.6–15.9)
LYMPH%: 37.6 % (ref 14.0–49.7)
MCH: 30.7 pg (ref 25.1–34.0)
MCHC: 34 g/dL (ref 31.5–36.0)
MCV: 90.2 fL (ref 79.5–101.0)
MONO#: 0.5 10*3/uL (ref 0.1–0.9)
MONO%: 6.4 % (ref 0.0–14.0)
NEUT#: 3.9 10*3/uL (ref 1.5–6.5)
NEUT%: 55.1 % (ref 38.4–76.8)
Platelets: 196 10*3/uL (ref 145–400)
RBC: 3.98 10*6/uL (ref 3.70–5.45)
RDW: 13.6 % (ref 11.2–14.5)
WBC: 7 10*3/uL (ref 3.9–10.3)
lymph#: 2.7 10*3/uL (ref 0.9–3.3)
nRBC: 0 % (ref 0–0)

## 2013-02-06 MED ORDER — FLUOROURACIL CHEMO INJECTION 2.5 GM/50ML
500.0000 mg/m2 | Freq: Once | INTRAVENOUS | Status: AC
  Administered 2013-02-06: 1200 mg via INTRAVENOUS
  Filled 2013-02-06: qty 24

## 2013-02-06 MED ORDER — DEXAMETHASONE SODIUM PHOSPHATE 20 MG/5ML IJ SOLN
INTRAMUSCULAR | Status: AC
  Filled 2013-02-06: qty 5

## 2013-02-06 MED ORDER — PALONOSETRON HCL INJECTION 0.25 MG/5ML
0.2500 mg | Freq: Once | INTRAVENOUS | Status: AC
  Administered 2013-02-06: 0.25 mg via INTRAVENOUS

## 2013-02-06 MED ORDER — SODIUM CHLORIDE 0.9 % IV SOLN
150.0000 mg | Freq: Once | INTRAVENOUS | Status: AC
  Administered 2013-02-06: 150 mg via INTRAVENOUS
  Filled 2013-02-06: qty 5

## 2013-02-06 MED ORDER — SODIUM CHLORIDE 0.9 % IV SOLN
500.0000 mg/m2 | Freq: Once | INTRAVENOUS | Status: AC
  Administered 2013-02-06: 1200 mg via INTRAVENOUS
  Filled 2013-02-06: qty 60

## 2013-02-06 MED ORDER — PALONOSETRON HCL INJECTION 0.25 MG/5ML
INTRAVENOUS | Status: AC
  Filled 2013-02-06: qty 5

## 2013-02-06 MED ORDER — DEXAMETHASONE SODIUM PHOSPHATE 20 MG/5ML IJ SOLN
12.0000 mg | Freq: Once | INTRAMUSCULAR | Status: AC
  Administered 2013-02-06: 12 mg via INTRAVENOUS

## 2013-02-06 MED ORDER — SODIUM CHLORIDE 0.9 % IJ SOLN
10.0000 mL | INTRAMUSCULAR | Status: DC | PRN
  Administered 2013-02-06: 10 mL
  Filled 2013-02-06: qty 10

## 2013-02-06 MED ORDER — SODIUM CHLORIDE 0.9 % IV SOLN
Freq: Once | INTRAVENOUS | Status: AC
  Administered 2013-02-06: 13:00:00 via INTRAVENOUS

## 2013-02-06 MED ORDER — HEPARIN SOD (PORK) LOCK FLUSH 100 UNIT/ML IV SOLN
500.0000 [IU] | Freq: Once | INTRAVENOUS | Status: AC | PRN
  Administered 2013-02-06: 500 [IU]
  Filled 2013-02-06: qty 5

## 2013-02-06 MED ORDER — EPIRUBICIN HCL CHEMO IV INJECTION 200 MG/100ML
100.0000 mg/m2 | Freq: Once | INTRAVENOUS | Status: AC
  Administered 2013-02-06: 240 mg via INTRAVENOUS
  Filled 2013-02-06: qty 120

## 2013-02-06 NOTE — Progress Notes (Signed)
Brisk blood return noted before, during and at end of Ellence administration.

## 2013-02-06 NOTE — Patient Instructions (Signed)
[facility] Discharge Instructions for Patients Receiving Chemotherapy  Today you received the following chemotherapy agents Ellence/5FU/Cytoxan.  To help prevent nausea and vomiting after your treatment, we encourage you to take your nausea medication as prescribed.   If you develop nausea and vomiting that is not controlled by your nausea medication, call the clinic.   BELOW ARE SYMPTOMS THAT SHOULD BE REPORTED IMMEDIATELY:  *FEVER GREATER THAN 100.5 F  *CHILLS WITH OR WITHOUT FEVER  NAUSEA AND VOMITING THAT IS NOT CONTROLLED WITH YOUR NAUSEA MEDICATION  *UNUSUAL SHORTNESS OF BREATH  *UNUSUAL BRUISING OR BLEEDING  TENDERNESS IN MOUTH AND THROAT WITH OR WITHOUT PRESENCE OF ULCERS  *URINARY PROBLEMS  *BOWEL PROBLEMS  UNUSUAL RASH Items with * indicate a potential emergency and should be followed up as soon as possible.  Feel free to call the clinic you have any questions or concerns. The clinic phone number is [phone].

## 2013-02-06 NOTE — Progress Notes (Addendum)
**Note De-Identified Theresa Obfuscation** [facility]  Telephone:(336) [phone] [phone]) [phone]  OFFICE PROGRESS NOTE  ID: [name]   DOB: 06-Jul-1975  MR#: [id number]  JYN#:[id number]   PCP: Stacie L. [place] SU:  Griselda Miner, MD  RAD ONC:  Chipper Herb, MD   DIAGNOSIS: [name] is a 37 y.o. 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  She was seen in the multidisciplinary breast clinic on 10/23/2012 for discussion of treatment options.    STAGE:  Cancer of lower-inner quadrant of female breast  Primary site: Breast (Left)  Staging method: AJCC 7th Edition  Clinical: Stage IA (T1c, N0, cM0)  Summary: Stage IA (T1c, N0, cM0)   PRIOR THERAPY: #1  A screening mammogram in [place], [place] on 09/23/2012 she was felt to have a new mass within the left breast. An ultrasound on 10/06/2012 showed a 1.6 x 1.3 x 1.3 cm mass at the 8 o'clock position 7 cm from the nipple. At the 12 o'clock position behind and nipple was a circumscribed oval shaped 2 x 1.8 x 1.2 cm lesion perhaps representing a fibroadenoma. 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 The carcinoma was grade 2-3, estrogen receptor 80% positive, progesterone receptor 80% positive, Ki-67 25%, and HER-2/neu negative. Bilateral breast MRI on 10/22/2012 showed biopsy proven carcinoma in the left lower inner quadrant identified and measures approximately 1.9 x 1.9 x 1.8 cm.  Well-defined nodule in the right upper outer quadrant  posteriorly consistent with a fibroadenoma. Probable liver cysts.There were multiple scattered nonspecific foci in each breast. No other suspicious abnormality was identified. No abnormal appearing lymph nodes (Clinical stage I, T1 N0).   #2 Status post left breast lumpectomy with left axillary sentinel lymph node biopsy on 12/10/2012 for a stage IIB,  pT2, pN73mi, MX, 2.3 cm invasive ductal carcinoma, grade 3, estrogen receptor 80% positive, progesterone receptor 80% positive, Ki-67 25%, HER-2/neu negative, with 1/2 left axillary sentinel lymph nodes positive for microscopic disease.   #3 Genetic testing performed in [place], [place] with self reported by the patient as BRCA1 and BRCA2 negative.  #4 Adjuvant chemotherapy consisting of FEC (5-FU/epirubicin/Cytoxan) began on 01/23/2013. Six cycles of FEC is planned. FEC will then be followed by weekly adjuvant chemotherapy consisting of Taxol for 12 weeks.   CURRENT THERAPY:  6 cycles of Q14 day chemotherapy consisting of FEC with curative intent.   INTERVAL HISTORY: [name] is a  37 y.o. female who returns today for followup of invasive ductal carcinoma of the left breast.  She is accompanied for today's office visit by her friend and coworker [name].   Since her last office visit on 01/30/2013 she states that she has been doing relatively well and does not have any complaints.  She completed entire course of antibiotic therapy with ciprofloxacin for chemotherapy-induced neutropenia and her blood counts have rebounded nicely.  Her interval history is otherwise stable and unremarkable.    MEDICAL HISTORY: Past Medical History  Diagnosis Date  . Urinary incontinence     occasional  . Family history of anesthesia complication     pt's mother has hx. of post-op N/V  . Hypertension     under control with med., has been on med. since 09/2012  . Anxiety   . Breast cancer     left    ALLERGIES:   Allergies  Allergen Reactions  .  Amoxicillin Other (See Comments)    States "it does not work"    MEDICATIONS:  Current Outpatient Prescriptions  Medication Sig Dispense Refill  . ALPRAZolam (XANAX) 0.25 MG tablet Take 1 tablet (0.25 mg total) by mouth at bedtime as needed for sleep.  30 tablet  2  . dexamethasone (DECADRON) 4 MG tablet Take 2 tablets by mouth once a day on the day  after chemotherapy and then take 2 tablets two times a day for 2 days. Take with food.  30 tablet  1  . HYDROcodone-acetaminophen (NORCO/VICODIN) 5-325 MG per tablet Take 1-2 tablets by mouth every 4 (four) hours as needed.  50 tablet  0  . lidocaine-prilocaine (EMLA) cream Apply topically as needed.  30 g  0  . lisinopril (PRINIVIL,ZESTRIL) 10 MG tablet Take 10 mg by mouth daily.      Marland Kitchen LORazepam (ATIVAN) 0.5 MG tablet Take 1 tablet (0.5 mg total) by mouth every 6 (six) hours as needed (Nausea or vomiting).  30 tablet  0  . omeprazole (PRILOSEC) 20 MG capsule Take 1 capsule (20 mg total) by mouth daily.  30 capsule  1  . ondansetron (ZOFRAN) 8 MG tablet Take 1 tablet (8 mg total) by mouth 2 (two) times daily as needed. Take two times a day as needed for nausea or vomiting starting on the third day after chemotherapy.  30 tablet  1  . prochlorperazine (COMPAZINE) 10 MG tablet Take 1 tablet (10 mg total) by mouth every 6 (six) hours as needed (Nausea or vomiting).  30 tablet  1  . ciprofloxacin (CIPRO) 500 MG tablet Take 1 tablet (500 mg total) by mouth 2 (two) times daily.  14 tablet  2  . oxyCODONE-acetaminophen (PERCOCET/ROXICET) 5-325 MG per tablet Take 1-2 tablets by mouth every 6 (six) hours as needed.        No current facility-administered medications for this visit.    SURGICAL HISTORY:  Past Surgical History  Procedure Laterality Date  . Cesarean section  1999  . Breast lumpectomy with needle localization and axillary sentinel lymph node bx Left 12/10/2012    Procedure: BREAST LUMPECTOMY WITH NEEDLE LOCALIZATION AND AXILLARY SENTINEL LYMPH NODE BX;  Surgeon: Robyne Askew, MD;  Location: [facility];  Service: General;  Laterality: Left;  Needle loc BCG 7:30  nuc med 9:30    . Tonsillectomy and adenoidectomy      as a teenager  . Cholecystectomy  1999  . Open reduction internal fixation (orif) tibia/fibula fracture Right   . Portacath placement Right 01/09/2013    Procedure: INSERTION  PORT-A-CATH;  Surgeon: Robyne Askew, MD;  Location: [facility];  Service: General;  Laterality: Right;    REVIEW OF SYSTEMS:  A 10 point review of systems was completed and is negative except as noted above.  Ms. Hight denies any other symptomatology including  fatigue, fever or chills, headache, vision changes, swollen glands, cough or shortness of breath, chest pain or discomfort, nausea, vomiting, diarrhea, constipation, change in urinary or bowel habits, arthralgias/myalgias, unusual bleeding/bruising or any other symptomatology.   PHYSICAL EXAMINATION: Blood pressure 137/88, pulse 93, temperature 98.1 F (36.7 C), temperature source Oral, resp. rate 18, height 5\' 9"  (1.753 m), weight 248 lb 3.2 oz (112.583 kg), SpO2 97.00%. Body mass index is 36.64 kg/(m^2).  ECOG PERFORMANCE STATUS: 0 - Asymptomatic  General appearance: Alert, cooperative, well nourished, no apparent distress Head: Normocephalic, without obvious abnormality, atraumatic Eyes: Conjunctivae/corneas clear, PERRLA, EOMI Nose:  Nares, septum and mucosa are normal, no drainage or sinus tenderness Neck: No adenopathy, supple, symmetrical, trachea midline, no tenderness Resp: Clear to auscultation bilaterally, no wheezes/rales/rhonchi Cardio: Regular rate and rhythm, S1, S2 normal, no murmur, click, rub or gallop, no edema, right chest Port-A-Cath without any signs of infection Breasts:  Deferred GI: Soft, distended, non-tender, hypoactive bowel sounds, excessive habitus Skin: No rashes/lesions, skin warm and dry, no erythematous areas, no cyanosis  M/S:  Atraumatic, normal strength in all extremities, normal range of motion, no clubbing  Lymph nodes: Cervical, supraclavicular, and axillary nodes normal Neurologic: Grossly normal, cranial nerves II through XII intact, alert and oriented x 3 Psych: Appropriate affect    LABORATORY DATA: Lab Results  Component Value Date   WBC 7.0 02/06/2013   HGB 12.2  02/06/2013   HCT 35.9 02/06/2013   MCV 90.2 02/06/2013   PLT 196 02/06/2013      Chemistry      Component Value Date/Time   NA 139 02/06/2013 1101   NA 136 01/23/2013 1413   K 4.1 02/06/2013 1101   K 3.7 01/23/2013 1413   CL 102 01/23/2013 1413   CO2 24 02/06/2013 1101   CO2 27 01/23/2013 1413   BUN 11.3 02/06/2013 1101   BUN 12 01/23/2013 1413   CREATININE 0.7 02/06/2013 1101   CREATININE 0.68 01/23/2013 1413      Component Value Date/Time   CALCIUM 9.3 02/06/2013 1101   CALCIUM 9.3 01/23/2013 1413   ALKPHOS 72 02/06/2013 1101   ALKPHOS 74 01/23/2013 1413   AST 35* 02/06/2013 1101   AST 27 01/23/2013 1413   ALT 47 02/06/2013 1101   ALT 30 01/23/2013 1413   BILITOT 0.55 02/06/2013 1101   BILITOT 0.5 01/23/2013 1413      RADIOGRAPHIC STUDIES: No results found.   ASSESSMENT:   Kasumi Ditullio is a 37 y.o. [place], [place] female with:  #1 Stage II, invasive ductal carcinoma of the left breast, grade 3, estrogen receptor/progesterone receptor positive, Ki-67 25%, HER-2/neu negative. Status post left breast lumpectomy with left axillary sentinel lymph node biopsy on 12/10/2012 with 1 of 2 sentinel nodes revealing microscopic disease. The patient elected not to receive axillary lymph node dissection and will undergo radiation therapy after the completion of chemotherapy.    #2 Started adjuvant chemotherapy on 01/23/2013 consisting of FEC (5-FU/Epirubicin/Cytoxan) with Neulasta support to be given every 2 weeks for a total of 6 cycles.  This will be followed by adjuvant chemotherapy consisting of Taxol on a weekly basis for 12 weeks. Port-A-Cath placement on 01/09/2013, 2D echocardiogram with LVEF of 50% - 55%  and chemotherapy class on 01/12/2013.   #3 Chemotherapy-induced neutropenia  - resolved   PLAN:  #1 Ms. Gullikson will proceed with adjuvant chemotherapy cycle #2 consisting of FEC today. Day 2 Neulasta injection is scheduled for tomorrow (02/07/2013).   #2 Ms. Mihalic will have  laboratories checked again on 02/16/2013.  We plan to see Ms. Gladu again on 02/20/2013 for laboratories and chemotherapy cycle #3 of FEC.  She is scheduled for Neulasta injection on 02/21/2013.  #3 We will schedule a repeat echocardiogram after 3 cycles of chemotherapy with FEC as her original left ventricular ejection fraction was 50% - 55%.  Repeat echocardiogram is scheduled for 03/06/2013.  If possible, we need the 2-D echo read before her chemotherapy infusion #4 also scheduled for 03/06/2013.  All questions answered. Ms. Nissley was encouraged to contact us in the interim with any questions, concerns, or problems  Larina Bras, NP-C 02/08/2013  6:45 PM  ATTENDING'S ATTESTATION:  I personally reviewed patient's chart, examined patient myself, formulated the treatment plan as followed.    37 year old with invasive ductal carcinoma of the left breast diagnosed July 2014. Patient underwent surgery and then has been started on adjuvant chemotherapy consisting of FEC. She is now here for cycle 2 of her chemotherapy. She's tolerating it well.  Patient will be seen back in one week's time for interim   Drue Second, MD Medical/Oncology [facility] [phone] (beeper) [phone] (Office)  02/18/2013, 10:01 AM

## 2013-02-06 NOTE — Telephone Encounter (Signed)
, °

## 2013-02-06 NOTE — Telephone Encounter (Signed)
Per staff message and POF I have scheduled appts.  JMW  

## 2013-02-06 NOTE — Patient Instructions (Signed)
Please contact us at [phone] if you have any questions or concerns.  Please continue to do well and enjoy life!!!  Get plenty of rest, drink plenty of water, exercise daily (walking), eat a balanced diet.  Take vitamin D3 1000 IUs daily.   Complete monthly self-breast examinations.  Have a clinical breast exam by a physician every year.  Have your mammogram completed every year.  Results for orders placed in visit on 02/06/13 (from the past 24 hour(s))  CBC WITH DIFFERENTIAL     Status: None   Collection Time    02/06/13 10:58 AM      Result Value Range   WBC 7.0  3.9 - 10.3 10e3/uL   NEUT# 3.9  1.5 - 6.5 10e3/uL   HGB 12.2  11.6 - 15.9 g/dL   HCT 13.0  86.5 - 78.4 %   Platelets 196  145 - 400 10e3/uL   MCV 90.2  79.5 - 101.0 fL   MCH 30.7  25.1 - 34.0 pg   MCHC 34.0  31.5 - 36.0 g/dL   RBC 6.96  2.95 - 2.84 10e6/uL   RDW 13.6  11.2 - 14.5 %   lymph# 2.7  0.9 - 3.3 10e3/uL   MONO# 0.5  0.1 - 0.9 10e3/uL   Eosinophils Absolute 0.0  0.0 - 0.5 10e3/uL   Basophils Absolute 0.0  0.0 - 0.1 10e3/uL   NEUT% 55.1  38.4 - 76.8 %   LYMPH% 37.6  14.0 - 49.7 %   MONO% 6.4  0.0 - 14.0 %   EOS% 0.3  0.0 - 7.0 %   BASO% 0.6  0.0 - 2.0 %   nRBC 0  0 - 0 %   Narrative:    Performed At:  [facility]               [address]               [place], [place] [address]

## 2013-02-07 ENCOUNTER — Ambulatory Visit (HOSPITAL_BASED_OUTPATIENT_CLINIC_OR_DEPARTMENT_OTHER): Payer: BC Managed Care – PPO

## 2013-02-07 VITALS — BP 126/84 | HR 96 | Temp 97.0°F | Resp 20

## 2013-02-07 DIAGNOSIS — C50319 Malignant neoplasm of lower-inner quadrant of unspecified female breast: Secondary | ICD-10-CM

## 2013-02-07 DIAGNOSIS — Z5189 Encounter for other specified aftercare: Secondary | ICD-10-CM

## 2013-02-07 DIAGNOSIS — C50312 Malignant neoplasm of lower-inner quadrant of left female breast: Secondary | ICD-10-CM

## 2013-02-07 MED ORDER — PEGFILGRASTIM INJECTION 6 MG/0.6ML
6.0000 mg | Freq: Once | SUBCUTANEOUS | Status: AC
  Administered 2013-02-07: 6 mg via SUBCUTANEOUS

## 2013-02-09 ENCOUNTER — Telehealth: Payer: Self-pay | Admitting: Oncology

## 2013-02-16 ENCOUNTER — Other Ambulatory Visit: Payer: BC Managed Care – PPO | Admitting: Lab

## 2013-02-16 ENCOUNTER — Ambulatory Visit: Payer: BC Managed Care – PPO | Admitting: Family

## 2013-02-16 ENCOUNTER — Other Ambulatory Visit (HOSPITAL_BASED_OUTPATIENT_CLINIC_OR_DEPARTMENT_OTHER): Payer: BC Managed Care – PPO | Admitting: Lab

## 2013-02-16 DIAGNOSIS — C50312 Malignant neoplasm of lower-inner quadrant of left female breast: Secondary | ICD-10-CM

## 2013-02-16 DIAGNOSIS — I82409 Acute embolism and thrombosis of unspecified deep veins of unspecified lower extremity: Secondary | ICD-10-CM

## 2013-02-16 DIAGNOSIS — C50319 Malignant neoplasm of lower-inner quadrant of unspecified female breast: Secondary | ICD-10-CM

## 2013-02-16 HISTORY — DX: Acute embolism and thrombosis of unspecified deep veins of unspecified lower extremity: I82.409

## 2013-02-16 LAB — CBC WITH DIFFERENTIAL/PLATELET
BASO%: 0.5 % (ref 0.0–2.0)
Basophils Absolute: 0.1 10*3/uL (ref 0.0–0.1)
HGB: 11 g/dL — ABNORMAL LOW (ref 11.6–15.9)
LYMPH%: 24.8 % (ref 14.0–49.7)
MCHC: 34.1 g/dL (ref 31.5–36.0)
MONO#: 0.8 10*3/uL (ref 0.1–0.9)
Platelets: 169 10*3/uL (ref 145–400)
RBC: 3.49 10*6/uL — ABNORMAL LOW (ref 3.70–5.45)
WBC: 10.8 10*3/uL — ABNORMAL HIGH (ref 3.9–10.3)
lymph#: 2.7 10*3/uL (ref 0.9–3.3)

## 2013-02-16 LAB — COMPREHENSIVE METABOLIC PANEL (CC13)
ALT: 34 U/L (ref 0–55)
Anion Gap: 7 mEq/L (ref 3–11)
BUN: 10.4 mg/dL (ref 7.0–26.0)
CO2: 27 mEq/L (ref 22–29)
Sodium: 137 mEq/L (ref 136–145)
Total Bilirubin: 0.42 mg/dL (ref 0.20–1.20)
Total Protein: 6.2 g/dL — ABNORMAL LOW (ref 6.4–8.3)

## 2013-02-20 ENCOUNTER — Ambulatory Visit (HOSPITAL_BASED_OUTPATIENT_CLINIC_OR_DEPARTMENT_OTHER): Payer: BC Managed Care – PPO | Admitting: Adult Health

## 2013-02-20 ENCOUNTER — Encounter: Payer: Self-pay | Admitting: Adult Health

## 2013-02-20 ENCOUNTER — Ambulatory Visit (HOSPITAL_BASED_OUTPATIENT_CLINIC_OR_DEPARTMENT_OTHER): Payer: BC Managed Care – PPO

## 2013-02-20 ENCOUNTER — Other Ambulatory Visit (HOSPITAL_BASED_OUTPATIENT_CLINIC_OR_DEPARTMENT_OTHER): Payer: BC Managed Care – PPO | Admitting: Lab

## 2013-02-20 VITALS — BP 120/77 | HR 89 | Temp 98.7°F | Resp 18 | Ht 69.0 in | Wt 248.1 lb

## 2013-02-20 DIAGNOSIS — C50319 Malignant neoplasm of lower-inner quadrant of unspecified female breast: Secondary | ICD-10-CM

## 2013-02-20 DIAGNOSIS — C50312 Malignant neoplasm of lower-inner quadrant of left female breast: Secondary | ICD-10-CM

## 2013-02-20 DIAGNOSIS — Z170 Estrogen receptor positive status [ER+]: Secondary | ICD-10-CM

## 2013-02-20 DIAGNOSIS — Z5111 Encounter for antineoplastic chemotherapy: Secondary | ICD-10-CM

## 2013-02-20 LAB — CBC WITH DIFFERENTIAL/PLATELET
BASO%: 0.4 % (ref 0.0–2.0)
EOS%: 0.1 % (ref 0.0–7.0)
HCT: 33.4 % — ABNORMAL LOW (ref 34.8–46.6)
LYMPH%: 22.5 % (ref 14.0–49.7)
MCH: 30.6 pg (ref 25.1–34.0)
MCHC: 34.4 g/dL (ref 31.5–36.0)
MONO%: 6.7 % (ref 0.0–14.0)
NEUT#: 8.1 10*3/uL — ABNORMAL HIGH (ref 1.5–6.5)
NEUT%: 70.3 % (ref 38.4–76.8)
Platelets: 122 10*3/uL — ABNORMAL LOW (ref 145–400)
lymph#: 2.6 10*3/uL (ref 0.9–3.3)

## 2013-02-20 LAB — COMPREHENSIVE METABOLIC PANEL (CC13)
AST: 29 U/L (ref 5–34)
Albumin: 3.5 g/dL (ref 3.5–5.0)
BUN: 11.5 mg/dL (ref 7.0–26.0)
Calcium: 9.4 mg/dL (ref 8.4–10.4)
Chloride: 103 mEq/L (ref 98–109)
Creatinine: 0.7 mg/dL (ref 0.6–1.1)
Glucose: 93 mg/dl (ref 70–140)
Potassium: 4.1 mEq/L (ref 3.5–5.1)
Total Bilirubin: 1.03 mg/dL (ref 0.20–1.20)

## 2013-02-20 MED ORDER — SODIUM CHLORIDE 0.9 % IV SOLN
Freq: Once | INTRAVENOUS | Status: AC
  Administered 2013-02-20: 13:00:00 via INTRAVENOUS

## 2013-02-20 MED ORDER — PALONOSETRON HCL INJECTION 0.25 MG/5ML
0.2500 mg | Freq: Once | INTRAVENOUS | Status: AC
  Administered 2013-02-20: 0.25 mg via INTRAVENOUS

## 2013-02-20 MED ORDER — PALONOSETRON HCL INJECTION 0.25 MG/5ML
INTRAVENOUS | Status: AC
  Filled 2013-02-20: qty 5

## 2013-02-20 MED ORDER — DEXAMETHASONE SODIUM PHOSPHATE 20 MG/5ML IJ SOLN
INTRAMUSCULAR | Status: AC
  Filled 2013-02-20: qty 5

## 2013-02-20 MED ORDER — HEPARIN SOD (PORK) LOCK FLUSH 100 UNIT/ML IV SOLN
500.0000 [IU] | Freq: Once | INTRAVENOUS | Status: AC | PRN
  Administered 2013-02-20: 500 [IU]
  Filled 2013-02-20: qty 5

## 2013-02-20 MED ORDER — SODIUM CHLORIDE 0.9 % IV SOLN
500.0000 mg/m2 | Freq: Once | INTRAVENOUS | Status: AC
  Administered 2013-02-20: 1200 mg via INTRAVENOUS
  Filled 2013-02-20: qty 60

## 2013-02-20 MED ORDER — SODIUM CHLORIDE 0.9 % IV SOLN
150.0000 mg | Freq: Once | INTRAVENOUS | Status: AC
  Administered 2013-02-20: 150 mg via INTRAVENOUS
  Filled 2013-02-20: qty 5

## 2013-02-20 MED ORDER — DEXAMETHASONE SODIUM PHOSPHATE 20 MG/5ML IJ SOLN
12.0000 mg | Freq: Once | INTRAMUSCULAR | Status: AC
  Administered 2013-02-20: 12 mg via INTRAVENOUS

## 2013-02-20 MED ORDER — EPIRUBICIN HCL CHEMO IV INJECTION 200 MG/100ML
100.0000 mg/m2 | Freq: Once | INTRAVENOUS | Status: AC
  Administered 2013-02-20: 240 mg via INTRAVENOUS
  Filled 2013-02-20: qty 120

## 2013-02-20 MED ORDER — SODIUM CHLORIDE 0.9 % IJ SOLN
10.0000 mL | INTRAMUSCULAR | Status: DC | PRN
  Administered 2013-02-20: 10 mL
  Filled 2013-02-20: qty 10

## 2013-02-20 MED ORDER — FLUOROURACIL CHEMO INJECTION 2.5 GM/50ML
500.0000 mg/m2 | Freq: Once | INTRAVENOUS | Status: AC
  Administered 2013-02-20: 1200 mg via INTRAVENOUS
  Filled 2013-02-20: qty 24

## 2013-02-20 NOTE — Patient Instructions (Signed)
[facility] Discharge Instructions for Patients Receiving Chemotherapy  Today you received the following chemotherapy agents: Ellence,Cytoxan and 5 FU  To help prevent nausea and vomiting after your treatment, we encourage you to take your nausea medications:  Zofran 8 mg twice daily as needed (start 3rd day after tx)  Decadron 8 mg 12/6, then twice daily X 2 days  Ativan 0.5 mg every 6 hours as needed  Compazine 10 mg every 6 hours as needed If you develop nausea and vomiting that is not controlled by your nausea medication, call the clinic.   BELOW ARE SYMPTOMS THAT SHOULD BE REPORTED IMMEDIATELY:  *FEVER GREATER THAN 100.5 F  *CHILLS WITH OR WITHOUT FEVER  NAUSEA AND VOMITING THAT IS NOT CONTROLLED WITH YOUR NAUSEA MEDICATION  *UNUSUAL SHORTNESS OF BREATH  *UNUSUAL BRUISING OR BLEEDING  TENDERNESS IN MOUTH AND THROAT WITH OR WITHOUT PRESENCE OF ULCERS  *URINARY PROBLEMS  *BOWEL PROBLEMS  UNUSUAL RASH Items with * indicate a potential emergency and should be followed up as soon as possible.  Feel free to call the clinic should you have any questions or concerns. The clinic phone number is [phone].  It has been a pleasure to serve you today !

## 2013-02-20 NOTE — Progress Notes (Addendum)
[facility]  Telephone:(336) [phone] [phone]) [phone]  OFFICE PROGRESS NOTE  ID: [name]   DOB: [birth year]  MR#: [id number]  JYN#:[id number]   PCP: Stacie L. [place] SU:  Theresa Miner, MD  RAD ONC:  Chipper Herb, MD   DIAGNOSIS: [name] is a 37 y.o. 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  She was seen in the multidisciplinary breast clinic on 10/23/2012 for discussion of treatment options.    STAGE:  Cancer of lower-inner quadrant of female breast  Primary site: Breast (Left)  Staging method: AJCC 7th Edition  Clinical: Stage IA (T1c, N0, cM0)  Summary: Stage IA (T1c, N0, cM0)   PRIOR THERAPY: #1  A screening mammogram in [place], [place] on 09/23/2012 she was felt to have a new mass within the left breast. An ultrasound on 10/06/2012 showed a 1.6 x 1.3 x 1.3 cm mass at the 8 o'clock position 7 cm from the nipple. At the 12 o'clock position behind and nipple was a circumscribed oval shaped 2 x 1.8 x 1.2 cm lesion perhaps representing a fibroadenoma. 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 The carcinoma was grade 2-3, estrogen receptor 80% positive, progesterone receptor 80% positive, Ki-67 25%, and HER-2/neu negative. Bilateral breast MRI on 10/22/2012 showed biopsy proven carcinoma in the left lower inner quadrant identified and measures approximately 1.9 x 1.9 x 1.8 cm.  Well-defined nodule in the right upper outer quadrant  posteriorly consistent with a fibroadenoma. Probable liver cysts.There were multiple scattered nonspecific foci in each breast. No other suspicious abnormality was identified. No abnormal appearing lymph nodes (Clinical stage I, T1 N0).   #2 Status post left breast lumpectomy with left axillary sentinel lymph node biopsy on 12/10/2012 for a stage IIB,  pT2, pN25mi, MX, 2.3 cm invasive ductal carcinoma, grade 3, estrogen receptor 80% positive, progesterone receptor 80% positive, Ki-67 25%, HER-2/neu negative, with 1/2 left axillary sentinel lymph nodes positive for microscopic disease.   #3 Genetic testing performed in [place], [place] with self reported by the patient as BRCA1 and BRCA2 negative.  #4 Adjuvant chemotherapy consisting of FEC (5-FU/epirubicin/Cytoxan) began on 01/23/2013. Six cycles of FEC is planned. FEC will then be followed by weekly adjuvant chemotherapy consisting of Taxol for 12 weeks.   CURRENT THERAPY:  FEC cycle 3 day 1   INTERVAL HISTORY: [name] is a  37 y.o. female who returns today for evaluation prior to her third cycle of FEC chemotherapy.  She's doing well today.  She denies fevers, chills, nausea, vomiting, constipation, diarrhea, numbness, or any further concerns.    MEDICAL HISTORY: Past Medical History  Diagnosis Date  . Urinary incontinence     occasional  . Family history of anesthesia complication     pt's mother has hx. of post-op N/V  . Hypertension     under control with med., has been on med. since 09/2012  . Anxiety   . Breast cancer     left    ALLERGIES:   Allergies  Allergen Reactions  . Amoxicillin Other (See Comments)    States "it does not work"    MEDICATIONS:  Current Outpatient Prescriptions  Medication Sig Dispense Refill  . ALPRAZolam (XANAX) 0.25 MG tablet Take 1 tablet (0.25 mg total) by mouth at bedtime as needed for sleep.  30 tablet  2  . dexamethasone (DECADRON)  4 MG tablet Take 2 tablets by mouth once a day on the day after chemotherapy and then take 2 tablets two times a day for 2 days. Take with food.  30 tablet  1  . HYDROcodone-acetaminophen (NORCO/VICODIN) 5-325 MG per tablet Take 1-2 tablets by mouth every 4 (four) hours as needed.  50 tablet  0  . lidocaine-prilocaine (EMLA) cream Apply topically as needed.  30 g  0  . lisinopril (PRINIVIL,ZESTRIL) 10  MG tablet Take 10 mg by mouth daily.      Marland Kitchen LORazepam (ATIVAN) 0.5 MG tablet Take 1 tablet (0.5 mg total) by mouth every 6 (six) hours as needed (Nausea or vomiting).  30 tablet  0  . omeprazole (PRILOSEC) 20 MG capsule Take 1 capsule (20 mg total) by mouth daily.  30 capsule  1  . prochlorperazine (COMPAZINE) 10 MG tablet Take 1 tablet (10 mg total) by mouth every 6 (six) hours as needed (Nausea or vomiting).  30 tablet  1  . ciprofloxacin (CIPRO) 500 MG tablet Take 1 tablet (500 mg total) by mouth 2 (two) times daily.  14 tablet  2  . ondansetron (ZOFRAN) 8 MG tablet Take 1 tablet (8 mg total) by mouth 2 (two) times daily as needed. Take two times a day as needed for nausea or vomiting starting on the third day after chemotherapy.  30 tablet  1   No current facility-administered medications for this visit.    SURGICAL HISTORY:  Past Surgical History  Procedure Laterality Date  . Cesarean section  1999  . Breast lumpectomy with needle localization and axillary sentinel lymph node bx Left 12/10/2012    Procedure: BREAST LUMPECTOMY WITH NEEDLE LOCALIZATION AND AXILLARY SENTINEL LYMPH NODE BX;  Surgeon: Robyne Askew, MD;  Location: [facility] OR;  Service: General;  Laterality: Left;  Needle loc BCG 7:30  nuc med 9:30    . Tonsillectomy and adenoidectomy      as a teenager  . Cholecystectomy  1999  . Open reduction internal fixation (orif) tibia/fibula fracture Right   . Portacath placement Right 01/09/2013    Procedure: INSERTION PORT-A-CATH;  Surgeon: Robyne Askew, MD;  Location: [facility];  Service: General;  Laterality: Right;    REVIEW OF SYSTEMS:  A 10 point review of systems was completed and is negative except as noted above.    PHYSICAL EXAMINATION: Blood pressure 120/77, pulse 89, temperature 98.7 F (37.1 C), temperature source Oral, resp. rate 18, height 5\' 9"  (1.753 m), weight 248 lb 1.6 oz (112.537 kg). Body mass index is 36.62 kg/(m^2). General: Patient is a  well appearing female in no acute distress HEENT: PERRLA, sclerae anicteric no conjunctival pallor, MMM Neck: supple, no palpable adenopathy Lungs: clear to auscultation bilaterally, no wheezes, rhonchi, or rales Cardiovascular: regular rate rhythm, S1, S2, no murmurs, rubs or gallops Abdomen: Soft, non-tender, non-distended, normoactive bowel sounds, no HSM Extremities: warm and well perfused, no clubbing, cyanosis, or edema Skin: No rashes or lesions Neuro: Non-focal ECOG PERFORMANCE STATUS: 0 - Asymptomatic   LABORATORY DATA: Lab Results  Component Value Date   WBC 11.5* 02/20/2013   HGB 11.5* 02/20/2013   HCT 33.4* 02/20/2013   MCV 88.8 02/20/2013   PLT 122* 02/20/2013      Chemistry      Component Value Date/Time   NA 137 02/16/2013 1559   NA 136 01/23/2013 1413   K 3.5 02/16/2013 1559   K 3.7 01/23/2013 1413   CL  102 01/23/2013 1413   CO2 27 02/16/2013 1559   CO2 27 01/23/2013 1413   BUN 10.4 02/16/2013 1559   BUN 12 01/23/2013 1413   CREATININE 0.9 02/16/2013 1559   CREATININE 0.68 01/23/2013 1413      Component Value Date/Time   CALCIUM 9.0 02/16/2013 1559   CALCIUM 9.3 01/23/2013 1413   ALKPHOS 70 02/16/2013 1559   ALKPHOS 74 01/23/2013 1413   AST 22 02/16/2013 1559   AST 27 01/23/2013 1413   ALT 34 02/16/2013 1559   ALT 30 01/23/2013 1413   BILITOT 0.42 02/16/2013 1559   BILITOT 0.5 01/23/2013 1413      RADIOGRAPHIC STUDIES: No results found.   ASSESSMENT:   [name] is a 37 y.o. [place], [place] female with:  #1 Stage II, invasive ductal carcinoma of the left breast, grade 3, estrogen receptor/progesterone receptor positive, Ki-67 25%, HER-2/neu negative. Status post left breast lumpectomy with left axillary sentinel lymph node biopsy on 12/10/2012 with 1 of 2 sentinel nodes revealing microscopic disease. The patient elected not to receive axillary lymph node dissection and will undergo radiation therapy after the completion of chemotherapy.    #2 Started adjuvant  chemotherapy on 01/23/2013 consisting of FEC (5-FU/Epirubicin/Cytoxan) with Neulasta support to be given every 2 weeks for a total of 6 cycles.  This will be followed by adjuvant chemotherapy consisting of Taxol on a weekly basis for 12 weeks. Port-A-Cath placement on 01/09/2013, 2D echocardiogram with LVEF of 50% - 55%  and chemotherapy class on 01/12/2013.   #3 Chemotherapy-induced neutropenia  - resolved   PLAN:  #1 [name] is doing well today.  Her CBC is stable.  I reviewed it with her in detail. She will proceed with chemotherapy today.    #2 She will return tomorrow for Neulasta, and in one week for labs, evaluation, and chemotherapy.    All questions answered. [name] was encouraged to contact us in the interim with any questions, concerns, or problems  I spent 25 minutes counseling the patient face to face.  The total time spent in the appointment was 30 minutes.  Theresa Level, NP Medical Oncology [facility] [phone]  02/20/2013, 11:32 AM    ATTENDING'S ATTESTATION:  I personally reviewed patient's chart, examined patient myself, formulated the treatment plan as followed.    Overall patient seems to be holding her own doing well with chemotherapy she does get tired and fatigued post chemotherapy. Today she feels well she will proceed with cycle 3 of FEC.  Drue Second, MD Medical/Oncology [facility] [phone] (beeper) [phone] (Office)  02/23/2013, 1:02 AM

## 2013-02-20 NOTE — Progress Notes (Signed)
Patient confirms that she is not pregnant. + blood return before, during and after administration of IV Ellence

## 2013-02-21 ENCOUNTER — Ambulatory Visit (HOSPITAL_BASED_OUTPATIENT_CLINIC_OR_DEPARTMENT_OTHER): Payer: BC Managed Care – PPO

## 2013-02-21 VITALS — BP 138/85 | HR 93 | Temp 96.9°F | Resp 18

## 2013-02-21 DIAGNOSIS — Z5189 Encounter for other specified aftercare: Secondary | ICD-10-CM

## 2013-02-21 DIAGNOSIS — C50319 Malignant neoplasm of lower-inner quadrant of unspecified female breast: Secondary | ICD-10-CM

## 2013-02-21 DIAGNOSIS — C50312 Malignant neoplasm of lower-inner quadrant of left female breast: Secondary | ICD-10-CM

## 2013-02-21 MED ORDER — PEGFILGRASTIM INJECTION 6 MG/0.6ML
6.0000 mg | Freq: Once | SUBCUTANEOUS | Status: AC
  Administered 2013-02-21: 6 mg via SUBCUTANEOUS

## 2013-02-23 ENCOUNTER — Other Ambulatory Visit: Payer: Self-pay | Admitting: Adult Health

## 2013-02-27 ENCOUNTER — Other Ambulatory Visit: Payer: BC Managed Care – PPO | Admitting: Lab

## 2013-02-27 ENCOUNTER — Encounter: Payer: Self-pay | Admitting: Adult Health

## 2013-02-27 ENCOUNTER — Telehealth: Payer: Self-pay | Admitting: *Deleted

## 2013-02-27 ENCOUNTER — Encounter: Payer: Self-pay | Admitting: Oncology

## 2013-02-27 ENCOUNTER — Ambulatory Visit: Payer: BC Managed Care – PPO | Admitting: Family

## 2013-02-27 ENCOUNTER — Ambulatory Visit (HOSPITAL_BASED_OUTPATIENT_CLINIC_OR_DEPARTMENT_OTHER): Payer: BC Managed Care – PPO | Admitting: Adult Health

## 2013-02-27 ENCOUNTER — Other Ambulatory Visit (HOSPITAL_BASED_OUTPATIENT_CLINIC_OR_DEPARTMENT_OTHER): Payer: BC Managed Care – PPO

## 2013-02-27 VITALS — BP 117/81 | HR 92 | Temp 98.1°F | Resp 18 | Ht 69.0 in | Wt 251.4 lb

## 2013-02-27 DIAGNOSIS — C50319 Malignant neoplasm of lower-inner quadrant of unspecified female breast: Secondary | ICD-10-CM

## 2013-02-27 DIAGNOSIS — C50312 Malignant neoplasm of lower-inner quadrant of left female breast: Secondary | ICD-10-CM

## 2013-02-27 DIAGNOSIS — Z170 Estrogen receptor positive status [ER+]: Secondary | ICD-10-CM

## 2013-02-27 LAB — CBC WITH DIFFERENTIAL/PLATELET
Basophils Absolute: 0 10*3/uL (ref 0.0–0.1)
EOS%: 0.7 % (ref 0.0–7.0)
Eosinophils Absolute: 0 10*3/uL (ref 0.0–0.5)
HCT: 28.9 % — ABNORMAL LOW (ref 34.8–46.6)
HGB: 10.2 g/dL — ABNORMAL LOW (ref 11.6–15.9)
LYMPH%: 83.7 % — ABNORMAL HIGH (ref 14.0–49.7)
MCH: 32 pg (ref 25.1–34.0)
MCV: 91.1 fL (ref 79.5–101.0)
MONO%: 7.6 % (ref 0.0–14.0)
NEUT#: 0.2 10*3/uL — CL (ref 1.5–6.5)
Platelets: 113 10*3/uL — ABNORMAL LOW (ref 145–400)
RBC: 3.18 10*6/uL — ABNORMAL LOW (ref 3.70–5.45)
RDW: 13.7 % (ref 11.2–14.5)

## 2013-02-27 LAB — COMPREHENSIVE METABOLIC PANEL (CC13)
ALT: 27 U/L (ref 0–55)
AST: 13 U/L (ref 5–34)
Alkaline Phosphatase: 93 U/L (ref 40–150)
BUN: 21.2 mg/dL (ref 7.0–26.0)
CO2: 25 mEq/L (ref 22–29)
Glucose: 152 mg/dl — ABNORMAL HIGH (ref 70–140)
Sodium: 138 mEq/L (ref 136–145)
Total Bilirubin: 1.15 mg/dL (ref 0.20–1.20)
Total Protein: 6.1 g/dL — ABNORMAL LOW (ref 6.4–8.3)

## 2013-02-27 NOTE — Telephone Encounter (Signed)
Pt is aware that i sent MW an email to add her tx's...td

## 2013-02-27 NOTE — Telephone Encounter (Signed)
Per staff message and POF I have scheduled appts.  JMW  

## 2013-02-27 NOTE — Patient Instructions (Signed)
  Patient Neutropenia Instruction Sheet  Diagnosis: Breast Cancer      Treating Physician: Drue Second, MD  Treatment: 1. Type of chemotherapy: FEC  2. Date of last treatment: 02/20/13  Last Blood Counts: Lab Results  Component Value Date   WBC 2.1* 02/27/2013   HGB 10.2* 02/27/2013   HCT 28.9* 02/27/2013   MCV 91.1 02/27/2013   PLT 113* 02/27/2013   ANC 200     Prophylactic Antibiotics: Cipro 500 mg by mouth twice a day Instructions: 1. Monitor temperature and call if fever  greater than 100.5, chills, shaking chills (rigors) 2. Call Physician on-call at [phone] 3. Give him/her symptoms and list of medications that you are taking and your last blood count.

## 2013-02-27 NOTE — Progress Notes (Signed)
[facility]  Telephone:(336) [phone] Fax:(336) [phone]  OFFICE PROGRESS NOTE  ID: [name]   DOB: [birth year]  MR#: [id number]  JYN#:[id number]   PCP: Stacie L. [place] SU:  Griselda Miner, MD  RAD ONC:  Chipper Herb, MD   DIAGNOSIS: [name] is a 37 y.o. 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  She was seen in the multidisciplinary breast clinic on 10/23/2012 for discussion of treatment options.    STAGE:  Cancer of lower-inner quadrant of female breast  Primary site: Breast (Left)  Staging method: AJCC 7th Edition  Clinical: Stage IA (T1c, N0, cM0)  Summary: Stage IA (T1c, N0, cM0)   PRIOR THERAPY: #1  A screening mammogram in [place], [place] on 09/23/2012 she was felt to have a new mass within the left breast. An ultrasound on 10/06/2012 showed a 1.6 x 1.3 x 1.3 cm mass at the 8 o'clock position 7 cm from the nipple. At the 12 o'clock position behind and nipple was a circumscribed oval shaped 2 x 1.8 x 1.2 cm lesion perhaps representing a fibroadenoma. 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 The carcinoma was grade 2-3, estrogen receptor 80% positive, progesterone receptor 80% positive, Ki-67 25%, and HER-2/neu negative. Bilateral breast MRI on 10/22/2012 showed biopsy proven carcinoma in the left lower inner quadrant identified and measures approximately 1.9 x 1.9 x 1.8 cm.  Well-defined nodule in the right upper outer quadrant posteriorly consistent with a fibroadenoma. Probable liver cysts.There were multiple scattered nonspecific foci in each breast. No other suspicious abnormality was identified. No abnormal appearing lymph nodes (Clinical stage I, T1 N0).   #2 Status post left breast lumpectomy with left axillary sentinel lymph node biopsy on 12/10/2012 for a stage IIB, pT2,  pN55mi, MX, 2.3 cm invasive ductal carcinoma, grade 3, estrogen receptor 80% positive, progesterone receptor 80% positive, Ki-67 25%, HER-2/neu negative, with 1/2 left axillary sentinel lymph nodes positive for microscopic disease.   #3 Genetic testing performed in [place], [place] with self reported by the patient as BRCA1 and BRCA2 negative.  #4 Adjuvant chemotherapy consisting of FEC (5-FU/epirubicin/Cytoxan) began on 01/23/2013. Six cycles of FEC is planned. FEC will then be followed by weekly adjuvant chemotherapy consisting of Taxol for 12 weeks.   CURRENT THERAPY:  FEC cycle 3 day 8   INTERVAL HISTORY: [name] is a  37 y.o. female who returns today for evaluation following receiving her third cycle of FEC chemotherapy.   She is doing well today.  She has had occasional cracked lips, but otherwise, denies fevers, chills, nausea, vomiting, constipation, diarrhea, numbness, or any further concerns.  She tolerated chemotherapy relatively well.    MEDICAL HISTORY: Past Medical History  Diagnosis Date  . Urinary incontinence     occasional  . Family history of anesthesia complication     pt's mother has hx. of post-op N/V  . Hypertension     under control with med., has been on med. since 09/2012  . Anxiety   . Breast cancer     left    ALLERGIES:   Allergies  Allergen Reactions  . Amoxicillin Other (See Comments)    States "it does not work"    MEDICATIONS:  Current Outpatient Prescriptions  Medication Sig Dispense Refill  . HYDROcodone-acetaminophen (NORCO/VICODIN) 5-325 MG per tablet Take 1-2 tablets by mouth every 4 (four)  hours as needed.  50 tablet  0  . lidocaine-prilocaine (EMLA) cream Apply topically as needed.  30 g  0  . lisinopril (PRINIVIL,ZESTRIL) 10 MG tablet Take 10 mg by mouth daily.      Marland Kitchen omeprazole (PRILOSEC) 20 MG capsule Take 1 capsule (20 mg total) by mouth daily.  30 capsule  1  . ALPRAZolam (XANAX) 0.25 MG tablet Take 1 tablet (0.25 mg total)  by mouth at bedtime as needed for sleep.  30 tablet  2  . ciprofloxacin (CIPRO) 500 MG tablet Take 1 tablet (500 mg total) by mouth 2 (two) times daily.  14 tablet  2  . dexamethasone (DECADRON) 4 MG tablet Take 2 tablets by mouth once a day on the day after chemotherapy and then take 2 tablets two times a day for 2 days. Take with food.  30 tablet  1  . LORazepam (ATIVAN) 0.5 MG tablet Take 1 tablet (0.5 mg total) by mouth every 6 (six) hours as needed (Nausea or vomiting).  30 tablet  0  . ondansetron (ZOFRAN) 8 MG tablet Take 1 tablet (8 mg total) by mouth 2 (two) times daily as needed. Take two times a day as needed for nausea or vomiting starting on the third day after chemotherapy.  30 tablet  1  . prochlorperazine (COMPAZINE) 10 MG tablet Take 1 tablet (10 mg total) by mouth every 6 (six) hours as needed (Nausea or vomiting).  30 tablet  1   No current facility-administered medications for this visit.    SURGICAL HISTORY:  Past Surgical History  Procedure Laterality Date  . Cesarean section  1999  . Breast lumpectomy with needle localization and axillary sentinel lymph node bx Left 12/10/2012    Procedure: BREAST LUMPECTOMY WITH NEEDLE LOCALIZATION AND AXILLARY SENTINEL LYMPH NODE BX;  Surgeon: Robyne Askew, MD;  Location: [facility];  Service: General;  Laterality: Left;  Needle loc BCG 7:30  nuc med 9:30    . Tonsillectomy and adenoidectomy      as a teenager  . Cholecystectomy  1999  . Open reduction internal fixation (orif) tibia/fibula fracture Right   . Portacath placement Right 01/09/2013    Procedure: INSERTION PORT-A-CATH;  Surgeon: Robyne Askew, MD;  Location: [facility];  Service: General;  Laterality: Right;    REVIEW OF SYSTEMS:  A 10 point review of systems was completed and is negative except as noted above.    PHYSICAL EXAMINATION: Blood pressure 117/81, pulse 92, temperature 98.1 F (36.7 C), temperature source Oral, resp. rate 18, height 5\' 9"   (1.753 m), weight 251 lb 6.4 oz (114.034 kg). Body mass index is 37.11 kg/(m^2). General: Patient is a well appearing female in no acute distress HEENT: PERRLA, sclerae anicteric no conjunctival pallor, MMM Neck: supple, no palpable adenopathy Lungs: clear to auscultation bilaterally, no wheezes, rhonchi, or rales Cardiovascular: regular rate rhythm, S1, S2, no murmurs, rubs or gallops Abdomen: Soft, non-tender, non-distended, normoactive bowel sounds, no HSM Extremities: warm and well perfused, no clubbing, cyanosis, or edema Skin: No rashes or lesions Neuro: Non-focal ECOG PERFORMANCE STATUS: 0 - Asymptomatic   LABORATORY DATA: Lab Results  Component Value Date   WBC 2.1* 02/27/2013   HGB 10.2* 02/27/2013   HCT 28.9* 02/27/2013   MCV 91.1 02/27/2013   PLT 113* 02/27/2013      Chemistry      Component Value Date/Time   NA 138 02/27/2013 1319   NA 136 01/23/2013 1413  K 3.5 02/27/2013 1319   K 3.7 01/23/2013 1413   CL 102 01/23/2013 1413   CO2 25 02/27/2013 1319   CO2 27 01/23/2013 1413   BUN 21.2 02/27/2013 1319   BUN 12 01/23/2013 1413   CREATININE 0.8 02/27/2013 1319   CREATININE 0.68 01/23/2013 1413      Component Value Date/Time   CALCIUM 8.8 02/27/2013 1319   CALCIUM 9.3 01/23/2013 1413   ALKPHOS 93 02/27/2013 1319   ALKPHOS 74 01/23/2013 1413   AST 13 02/27/2013 1319   AST 27 01/23/2013 1413   ALT 27 02/27/2013 1319   ALT 30 01/23/2013 1413   BILITOT 1.15 02/27/2013 1319   BILITOT 0.5 01/23/2013 1413      RADIOGRAPHIC STUDIES: No results found.   ASSESSMENT:   [name] is a 37 y.o. [place], [place] female with:  #1 Stage II, invasive ductal carcinoma of the left breast, grade 3, estrogen receptor/progesterone receptor positive, Ki-67 25%, HER-2/neu negative. Status post left breast lumpectomy with left axillary sentinel lymph node biopsy on 12/10/2012 with 1 of 2 sentinel nodes revealing microscopic disease. The patient elected not to receive axillary lymph  node dissection and will undergo radiation therapy after the completion of chemotherapy.    #2 Started adjuvant chemotherapy on 01/23/2013 consisting of FEC (5-FU/Epirubicin/Cytoxan) with Neulasta support to be given every 2 weeks for a total of 6 cycles.  This will be followed by adjuvant chemotherapy consisting of Taxol on a weekly basis for 12 weeks. Port-A-Cath placement on 01/09/2013, 2D echocardiogram with LVEF of 50% - 55%  and chemotherapy class on 01/12/2013.   PLAN:  #1  Patient is doing well.  She is subsequently neutropenic after receiving chemotherapy.  She will have her Cipro refilled and pick it up today.  I reviewed neutropenic precuations with her in detail.    #2 She will return next week for labs, evaluation, and cycle four of FEC.    All questions answered. [name] was encouraged to contact us in the interim with any questions, concerns, or problems  I spent 25 minutes counseling the patient face to face.  The total time spent in the appointment was 30 minutes.  Illa Level, NP Medical Oncology [facility] [phone] 03/01/2013, 9:45 AM

## 2013-03-05 ENCOUNTER — Ambulatory Visit: Payer: BC Managed Care – PPO | Admitting: Adult Health

## 2013-03-05 ENCOUNTER — Other Ambulatory Visit: Payer: BC Managed Care – PPO | Admitting: Lab

## 2013-03-05 ENCOUNTER — Encounter: Payer: Self-pay | Admitting: Adult Health

## 2013-03-05 ENCOUNTER — Other Ambulatory Visit (HOSPITAL_BASED_OUTPATIENT_CLINIC_OR_DEPARTMENT_OTHER): Payer: BC Managed Care – PPO

## 2013-03-05 ENCOUNTER — Ambulatory Visit (HOSPITAL_BASED_OUTPATIENT_CLINIC_OR_DEPARTMENT_OTHER): Payer: BC Managed Care – PPO | Admitting: Adult Health

## 2013-03-05 VITALS — BP 135/73 | HR 102 | Temp 98.0°F | Resp 18 | Ht 69.0 in | Wt 250.7 lb

## 2013-03-05 DIAGNOSIS — Z170 Estrogen receptor positive status [ER+]: Secondary | ICD-10-CM

## 2013-03-05 DIAGNOSIS — C50312 Malignant neoplasm of lower-inner quadrant of left female breast: Secondary | ICD-10-CM

## 2013-03-05 DIAGNOSIS — C50319 Malignant neoplasm of lower-inner quadrant of unspecified female breast: Secondary | ICD-10-CM

## 2013-03-05 DIAGNOSIS — H6191 Disorder of right external ear, unspecified: Secondary | ICD-10-CM

## 2013-03-05 LAB — CBC WITH DIFFERENTIAL/PLATELET
Basophils Absolute: 0.1 10*3/uL (ref 0.0–0.1)
EOS%: 0 % (ref 0.0–7.0)
Eosinophils Absolute: 0 10*3/uL (ref 0.0–0.5)
HCT: 28.7 % — ABNORMAL LOW (ref 34.8–46.6)
HGB: 10 g/dL — ABNORMAL LOW (ref 11.6–15.9)
LYMPH%: 13.8 % — ABNORMAL LOW (ref 14.0–49.7)
MCH: 32.2 pg (ref 25.1–34.0)
MCV: 92.4 fL (ref 79.5–101.0)
MONO#: 0.7 10*3/uL (ref 0.1–0.9)
NEUT#: 9.3 10*3/uL — ABNORMAL HIGH (ref 1.5–6.5)
NEUT%: 79.8 % — ABNORMAL HIGH (ref 38.4–76.8)
Platelets: 168 10*3/uL (ref 145–400)
RDW: 15.3 % — ABNORMAL HIGH (ref 11.2–14.5)
WBC: 11.6 10*3/uL — ABNORMAL HIGH (ref 3.9–10.3)

## 2013-03-05 LAB — COMPREHENSIVE METABOLIC PANEL (CC13)
Anion Gap: 7 mEq/L (ref 3–11)
BUN: 13 mg/dL (ref 7.0–26.0)
CO2: 25 mEq/L (ref 22–29)
Creatinine: 0.8 mg/dL (ref 0.6–1.1)
Glucose: 115 mg/dl (ref 70–140)
Potassium: 3.6 mEq/L (ref 3.5–5.1)
Sodium: 136 mEq/L (ref 136–145)
Total Bilirubin: 0.78 mg/dL (ref 0.20–1.20)
Total Protein: 6.5 g/dL (ref 6.4–8.3)

## 2013-03-05 MED ORDER — HYDROCODONE-ACETAMINOPHEN 5-325 MG PO TABS: 1.0000 | ORAL_TABLET | ORAL | Status: DC | PRN

## 2013-03-05 NOTE — Progress Notes (Signed)
[facility]  Telephone:(336) [phone] Fax:(336) [phone]  OFFICE PROGRESS NOTE  ID: [name]   DOB: 09/07/1975  MR#: [id number]  JYN#:[id number]   PCP: Darnelle Maffucci L. [place] SU:  Griselda Miner, MD  RAD ONC:  Chipper Herb, MD   DIAGNOSIS: [name] is a 37 y.o. 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  She was seen in the multidisciplinary breast clinic on 10/23/2012 for discussion of treatment options.    STAGE:  Cancer of lower-inner quadrant of female breast  Primary site: Breast (Left)  Staging method: AJCC 7th Edition  Clinical: Stage IA (T1c, N0, cM0)  Summary: Stage IA (T1c, N0, cM0)   PRIOR THERAPY: #1  A screening mammogram in [place], [place] on 09/23/2012 she was felt to have a new mass within the left breast. An ultrasound on 10/06/2012 showed a 1.6 x 1.3 x 1.3 cm mass at the 8 o'clock position 7 cm from the nipple. At the 12 o'clock position behind and nipple was a circumscribed oval shaped 2 x 1.8 x 1.2 cm lesion perhaps representing a fibroadenoma. 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 The carcinoma was grade 2-3, estrogen receptor 80% positive, progesterone receptor 80% positive, Ki-67 25%, and HER-2/neu negative. Bilateral breast MRI on 10/22/2012 showed biopsy proven carcinoma in the left lower inner quadrant identified and measures approximately 1.9 x 1.9 x 1.8 cm.  Well-defined nodule in the right upper outer quadrant posteriorly consistent with a fibroadenoma. Probable liver cysts.There were multiple scattered nonspecific foci in each breast. No other suspicious abnormality was identified. No abnormal appearing lymph nodes (Clinical stage I, T1 N0).   #2 Status post left breast lumpectomy with left axillary sentinel lymph node biopsy on 12/10/2012 for a stage IIB, pT2,  pN52mi, MX, 2.3 cm invasive ductal carcinoma, grade 3, estrogen receptor 80% positive, progesterone receptor 80% positive, Ki-67 25%, HER-2/neu negative, with 1/2 left axillary sentinel lymph nodes positive for microscopic disease.   #3 Genetic testing performed in [place], [place] with self reported by the patient as BRCA1 and BRCA2 negative.  #4 Adjuvant chemotherapy consisting of FEC (5-FU/epirubicin/Cytoxan) began on 01/23/2013. Six cycles of FEC is planned. FEC will then be followed by weekly adjuvant chemotherapy consisting of Taxol for 12 weeks.   CURRENT THERAPY:  FEC cycle 4 day 1  INTERVAL HISTORY: [name] is a  37 y.o. female who returns today for evaluation prior to her fourth cycle of FEC chemotherapy.   She is doing well today.  She took her cipro without difficulty and is not having any problems today.  She does have her echo scheduled for tomorrow prior to chemotherapy.  We will need the results before we can treat her.  She denies fevers, chills, nausea, vomiting, constipation, diarrhea, numbness, or any further concerns.     MEDICAL HISTORY: Past Medical History  Diagnosis Date  . Urinary incontinence     occasional  . Family history of anesthesia complication     pt's mother has hx. of post-op N/V  . Hypertension     under control with med., has been on med. since 09/2012  . Anxiety   . Breast cancer     left    ALLERGIES:   Allergies  Allergen Reactions  . Amoxicillin Other (See Comments)    States "it does not work"    MEDICATIONS:  Current Outpatient Prescriptions  Medication Sig Dispense Refill  . ALPRAZolam (XANAX) 0.25 MG tablet Take 1 tablet (0.25 mg total) by mouth at bedtime as needed for sleep.  30 tablet  2  . ciprofloxacin (CIPRO) 500 MG tablet Take 1 tablet (500 mg total) by mouth 2 (two) times daily.  14 tablet  2  . dexamethasone (DECADRON) 4 MG tablet Take 2 tablets by mouth once a day on the day after chemotherapy and then take 2  tablets two times a day for 2 days. Take with food.  30 tablet  1  . HYDROcodone-acetaminophen (NORCO/VICODIN) 5-325 MG per tablet Take 1-2 tablets by mouth every 4 (four) hours as needed.  60 tablet  0  . lidocaine-prilocaine (EMLA) cream Apply topically as needed.  30 g  0  . lisinopril (PRINIVIL,ZESTRIL) 10 MG tablet Take 10 mg by mouth daily.      Marland Kitchen LORazepam (ATIVAN) 0.5 MG tablet Take 1 tablet (0.5 mg total) by mouth every 6 (six) hours as needed (Nausea or vomiting).  30 tablet  0  . omeprazole (PRILOSEC) 20 MG capsule Take 1 capsule (20 mg total) by mouth daily.  30 capsule  1  . ondansetron (ZOFRAN) 8 MG tablet Take 1 tablet (8 mg total) by mouth 2 (two) times daily as needed. Take two times a day as needed for nausea or vomiting starting on the third day after chemotherapy.  30 tablet  1  . prochlorperazine (COMPAZINE) 10 MG tablet Take 1 tablet (10 mg total) by mouth every 6 (six) hours as needed (Nausea or vomiting).  30 tablet  1   No current facility-administered medications for this visit.    SURGICAL HISTORY:  Past Surgical History  Procedure Laterality Date  . Cesarean section  1999  . Breast lumpectomy with needle localization and axillary sentinel lymph node bx Left 12/10/2012    Procedure: BREAST LUMPECTOMY WITH NEEDLE LOCALIZATION AND AXILLARY SENTINEL LYMPH NODE BX;  Surgeon: Robyne Askew, MD;  Location: [facility];  Service: General;  Laterality: Left;  Needle loc BCG 7:30  nuc med 9:30    . Tonsillectomy and adenoidectomy      as a teenager  . Cholecystectomy  1999  . Open reduction internal fixation (orif) tibia/fibula fracture Right   . Portacath placement Right 01/09/2013    Procedure: INSERTION PORT-A-CATH;  Surgeon: Robyne Askew, MD;  Location: [facility];  Service: General;  Laterality: Right;    REVIEW OF SYSTEMS:  A 10 point review of systems was completed and is negative except as noted above.    PHYSICAL EXAMINATION: Blood pressure  135/73, pulse 102, temperature 98 F (36.7 C), temperature source Oral, resp. rate 18, height 5\' 9"  (1.753 m), weight 250 lb 11.2 oz (113.717 kg). Body mass index is 37.01 kg/(m^2). General: Patient is a well appearing female in no acute distress HEENT: PERRLA, sclerae anicteric no conjunctival pallor, MMM Neck: supple, no palpable adenopathy Lungs: clear to auscultation bilaterally, no wheezes, rhonchi, or rales Cardiovascular: regular rate rhythm, S1, S2, no murmurs, rubs or gallops Abdomen: Soft, non-tender, non-distended, normoactive bowel sounds, no HSM Extremities: warm and well perfused, no clubbing, cyanosis, or edema Skin: No rashes or lesions Neuro: Non-focal ECOG PERFORMANCE STATUS: 0 - Asymptomatic   LABORATORY DATA: Lab Results  Component Value Date   WBC 11.6* 03/05/2013   HGB 10.0* 03/05/2013   HCT 28.7* 03/05/2013   MCV 92.4 03/05/2013   PLT 168 03/05/2013  Chemistry      Component Value Date/Time   NA 136 03/05/2013 1414   NA 136 01/23/2013 1413   K 3.6 03/05/2013 1414   K 3.7 01/23/2013 1413   CL 102 01/23/2013 1413   CO2 25 03/05/2013 1414   CO2 27 01/23/2013 1413   BUN 13.0 03/05/2013 1414   BUN 12 01/23/2013 1413   CREATININE 0.8 03/05/2013 1414   CREATININE 0.68 01/23/2013 1413      Component Value Date/Time   CALCIUM 9.1 03/05/2013 1414   CALCIUM 9.3 01/23/2013 1413   ALKPHOS 92 03/05/2013 1414   ALKPHOS 74 01/23/2013 1413   AST 22 03/05/2013 1414   AST 27 01/23/2013 1413   ALT 32 03/05/2013 1414   ALT 30 01/23/2013 1413   BILITOT 0.78 03/05/2013 1414   BILITOT 0.5 01/23/2013 1413      RADIOGRAPHIC STUDIES: No results found.   ASSESSMENT:   Kinnedy Mongiello is a 37 y.o. [place], [place] female with:  #1 Stage II, invasive ductal carcinoma of the left breast, grade 3, estrogen receptor/progesterone receptor positive, Ki-67 25%, HER-2/neu negative. Status post left breast lumpectomy with left axillary sentinel lymph node biopsy on 12/10/2012 with  1 of 2 sentinel nodes revealing microscopic disease. The patient elected not to receive axillary lymph node dissection and will undergo radiation therapy after the completion of chemotherapy.    #2 Started adjuvant chemotherapy on 01/23/2013 consisting of FEC (5-FU/Epirubicin/Cytoxan) with Neulasta support to be given every 2 weeks for a total of 6 cycles.  This will be followed by adjuvant chemotherapy consisting of Taxol on a weekly basis for 12 weeks. Port-A-Cath placement on 01/09/2013, 2D echocardiogram with LVEF of 50% - 55%  and chemotherapy class on 01/12/2013.   PLAN:  #1  Patient is doing well.  Her labs are stable.  She will undergo an echocardiogram tomorrow, and if the results are stable, she will proceed with chemotherapy.  She has an appointment with Dr. Leory Plowman on 03/09/13.    #2 She will return next week for labs,and evaluation for chemo toxicities.   All questions answered. [name] was encouraged to contact us in the interim with any questions, concerns, or problems  I spent 25 minutes counseling the patient face to face.  The total time spent in the appointment was 30 minutes.  Theresa Level, NP Medical Oncology [facility] [phone] 03/07/2013, 8:17 AM

## 2013-03-06 ENCOUNTER — Ambulatory Visit: Payer: BC Managed Care – PPO

## 2013-03-06 ENCOUNTER — Telehealth: Payer: Self-pay | Admitting: Oncology

## 2013-03-06 ENCOUNTER — Ambulatory Visit (HOSPITAL_COMMUNITY)
Admission: RE | Admit: 2013-03-06 | Discharge: 2013-03-06 | Disposition: A | Discharge status: Home / Self Care | Payer: BC Managed Care – PPO | Source: Ambulatory Visit | Attending: Family Medicine | Admitting: Family Medicine

## 2013-03-06 ENCOUNTER — Ambulatory Visit: Payer: BC Managed Care – PPO | Admitting: Oncology

## 2013-03-06 ENCOUNTER — Ambulatory Visit (HOSPITAL_BASED_OUTPATIENT_CLINIC_OR_DEPARTMENT_OTHER): Payer: BC Managed Care – PPO

## 2013-03-06 ENCOUNTER — Other Ambulatory Visit: Payer: BC Managed Care – PPO | Admitting: Lab

## 2013-03-06 ENCOUNTER — Ambulatory Visit: Payer: BC Managed Care – PPO | Admitting: Family

## 2013-03-06 ENCOUNTER — Other Ambulatory Visit (HOSPITAL_COMMUNITY): Payer: Self-pay | Admitting: Internal Medicine

## 2013-03-06 VITALS — BP 128/81 | HR 92 | Temp 97.6°F | Resp 18

## 2013-03-06 DIAGNOSIS — C50919 Malignant neoplasm of unspecified site of unspecified female breast: Secondary | ICD-10-CM

## 2013-03-06 DIAGNOSIS — Z09 Encounter for follow-up examination after completed treatment for conditions other than malignant neoplasm: Secondary | ICD-10-CM

## 2013-03-06 DIAGNOSIS — Z5111 Encounter for antineoplastic chemotherapy: Secondary | ICD-10-CM

## 2013-03-06 DIAGNOSIS — C50319 Malignant neoplasm of lower-inner quadrant of unspecified female breast: Secondary | ICD-10-CM

## 2013-03-06 DIAGNOSIS — C50312 Malignant neoplasm of lower-inner quadrant of left female breast: Secondary | ICD-10-CM

## 2013-03-06 MED ORDER — FLUOROURACIL CHEMO INJECTION 2.5 GM/50ML
500.0000 mg/m2 | Freq: Once | INTRAVENOUS | Status: AC
  Administered 2013-03-06: 1200 mg via INTRAVENOUS
  Filled 2013-03-06: qty 24

## 2013-03-06 MED ORDER — PALONOSETRON HCL INJECTION 0.25 MG/5ML
INTRAVENOUS | Status: AC
  Filled 2013-03-06: qty 5

## 2013-03-06 MED ORDER — SODIUM CHLORIDE 0.9 % IV SOLN
500.0000 mg/m2 | Freq: Once | INTRAVENOUS | Status: AC
  Administered 2013-03-06: 1200 mg via INTRAVENOUS
  Filled 2013-03-06: qty 60

## 2013-03-06 MED ORDER — SODIUM CHLORIDE 0.9 % IV SOLN
150.0000 mg | Freq: Once | INTRAVENOUS | Status: AC
  Administered 2013-03-06: 150 mg via INTRAVENOUS
  Filled 2013-03-06: qty 5

## 2013-03-06 MED ORDER — HEPARIN SOD (PORK) LOCK FLUSH 100 UNIT/ML IV SOLN
500.0000 [IU] | Freq: Once | INTRAVENOUS | Status: AC | PRN
  Administered 2013-03-06: 500 [IU]
  Filled 2013-03-06: qty 5

## 2013-03-06 MED ORDER — PALONOSETRON HCL INJECTION 0.25 MG/5ML
0.2500 mg | Freq: Once | INTRAVENOUS | Status: AC
  Administered 2013-03-06: 0.25 mg via INTRAVENOUS

## 2013-03-06 MED ORDER — SODIUM CHLORIDE 0.9 % IJ SOLN
10.0000 mL | INTRAMUSCULAR | Status: DC | PRN
  Administered 2013-03-06: 10 mL
  Filled 2013-03-06: qty 10

## 2013-03-06 MED ORDER — DEXAMETHASONE SODIUM PHOSPHATE 20 MG/5ML IJ SOLN
INTRAMUSCULAR | Status: AC
  Filled 2013-03-06: qty 5

## 2013-03-06 MED ORDER — SODIUM CHLORIDE 0.9 % IV SOLN
Freq: Once | INTRAVENOUS | Status: AC
  Administered 2013-03-06: 15:00:00 via INTRAVENOUS

## 2013-03-06 MED ORDER — DEXAMETHASONE SODIUM PHOSPHATE 20 MG/5ML IJ SOLN
12.0000 mg | Freq: Once | INTRAMUSCULAR | Status: AC
  Administered 2013-03-06: 12 mg via INTRAVENOUS

## 2013-03-06 MED ORDER — EPIRUBICIN HCL CHEMO IV INJECTION 200 MG/100ML
100.0000 mg/m2 | Freq: Once | INTRAVENOUS | Status: AC
  Administered 2013-03-06: 240 mg via INTRAVENOUS
  Filled 2013-03-06: qty 120

## 2013-03-06 NOTE — Progress Notes (Signed)
  Echocardiogram 2D Echocardiogram has been performed.  Georgian Co 03/06/2013, 11:35 AM

## 2013-03-06 NOTE — Telephone Encounter (Signed)
, °

## 2013-03-06 NOTE — Patient Instructions (Signed)
[facility] Discharge Instructions for Patients Receiving Chemotherapy  Today you received the following chemotherapy agents: Cytoxan, 5FU, Ellence  To help prevent nausea and vomiting after your treatment, we encourage you to take your nausea medication as prescribed.    If you develop nausea and vomiting that is not controlled by your nausea medication, call the clinic.   BELOW ARE SYMPTOMS THAT SHOULD BE REPORTED IMMEDIATELY:  *FEVER GREATER THAN 100.5 F  *CHILLS WITH OR WITHOUT FEVER  NAUSEA AND VOMITING THAT IS NOT CONTROLLED WITH YOUR NAUSEA MEDICATION  *UNUSUAL SHORTNESS OF BREATH  *UNUSUAL BRUISING OR BLEEDING  TENDERNESS IN MOUTH AND THROAT WITH OR WITHOUT PRESENCE OF ULCERS  *URINARY PROBLEMS  *BOWEL PROBLEMS  UNUSUAL RASH Items with * indicate a potential emergency and should be followed up as soon as possible.  Feel free to call the clinic you have any questions or concerns. The clinic phone number is [phone].

## 2013-03-07 ENCOUNTER — Ambulatory Visit: Payer: BC Managed Care – PPO

## 2013-03-07 ENCOUNTER — Ambulatory Visit (HOSPITAL_BASED_OUTPATIENT_CLINIC_OR_DEPARTMENT_OTHER): Payer: BC Managed Care – PPO

## 2013-03-07 VITALS — BP 127/71 | HR 85 | Temp 97.9°F | Resp 18

## 2013-03-07 DIAGNOSIS — C50312 Malignant neoplasm of lower-inner quadrant of left female breast: Secondary | ICD-10-CM

## 2013-03-07 DIAGNOSIS — Z5189 Encounter for other specified aftercare: Secondary | ICD-10-CM

## 2013-03-07 DIAGNOSIS — C50319 Malignant neoplasm of lower-inner quadrant of unspecified female breast: Secondary | ICD-10-CM

## 2013-03-07 MED ORDER — PEGFILGRASTIM INJECTION 6 MG/0.6ML
6.0000 mg | Freq: Once | SUBCUTANEOUS | Status: AC
  Administered 2013-03-07: 6 mg via SUBCUTANEOUS

## 2013-03-09 ENCOUNTER — Ambulatory Visit (HOSPITAL_COMMUNITY)
Admission: RE | Admit: 2013-03-09 | Discharge: 2013-03-09 | Disposition: A | Discharge status: Home / Self Care | Payer: BC Managed Care – PPO | Source: Ambulatory Visit | Attending: Internal Medicine | Admitting: Internal Medicine

## 2013-03-09 ENCOUNTER — Encounter (HOSPITAL_COMMUNITY): Payer: Self-pay

## 2013-03-09 VITALS — BP 108/66 | HR 95 | Wt 249.1 lb

## 2013-03-09 DIAGNOSIS — C50312 Malignant neoplasm of lower-inner quadrant of left female breast: Secondary | ICD-10-CM

## 2013-03-09 DIAGNOSIS — C50319 Malignant neoplasm of lower-inner quadrant of unspecified female breast: Secondary | ICD-10-CM

## 2013-03-09 DIAGNOSIS — C50919 Malignant neoplasm of unspecified site of unspecified female breast: Secondary | ICD-10-CM | POA: Insufficient documentation

## 2013-03-09 NOTE — Progress Notes (Signed)
Patient ID: [name], female   DOB: [birth year], 37 y.o.   MRN: [id number]  Oncologist: Dr Darnelle Catalan PCP: none  HPI: [name] is a 37 y.o. 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 Darnelle Catalan  She was diagnosed in 7/14 with stage II, invasive ductal carcinoma of the left breast, grade 3, estrogen receptor/progesterone receptor positive, Ki-67 25%, HER-2/neu negative. Started adjuvant chemotherapy on 01/23/2013 consisting of FEC (5-FU/Epirubicin/Cytoxan) with Neulasta support to be given every 2 weeks for a total of 6 cycles. She has had 4 cycles. This will be followed by adjuvant chemotherapy consisting of Taxol on a weekly basis for 12 weeks.  ECHO 01/12/13 EF 50-55%  Lateral s' 12.4 ECHO 03/06/13 EF 60% Lateral s' 12.1 Global Strain -18.9  She presents as a new patient. Works at [employer] for [employer]. Denies SOB/Orthopnea. Does snore but denies OSA.   FH: Grandma had MI SH: Quit smoking 11/2012. Occasional alcohol. Lives at home with 32 year old daughter  Review of Systems:     Cardiac Review of Systems: {Y] = yes [ ]  = no  Chest Pain [    ]  Resting SOB [   ] Exertional SOB  [  ]  Orthopnea [  ]   Pedal Edema [   ]    Palpitations [  ] Syncope  [  ]   Presyncope [   ]  General Review of Systems: [Y] = yes [  ]=no Constitional: recent weight change [  ]; anorexia [  ]; fatigue [ Y ]; nausea [  ]; night sweats [  ]; fever [  ]; or chills [  ];                                                                                                                                     Dental: poor dentition[  ];   Eye : blurred vision [  ]; diplopia [   ]; vision changes [  ];  Amaurosis fugax[  ]; Resp: cough [  ];  wheezing[  ];  hemoptysis[  ]; shortness of breath[  ]; paroxysmal nocturnal dyspnea[  ]; dyspnea on exertion[  ]; or orthopnea[  ];  GI:  gallstones[  ], vomiting[  ];   dysphagia[  ]; melena[  ];  hematochezia [  ]; heartburn[  ];   Hx of  Colonoscopy[  ]; GU: kidney stones [  ]; hematuria[  ];   dysuria [  ];  nocturia[  ];  history of     obstruction [  ];                 Skin: rash, swelling[  ];, hair loss[  ];  peripheral edema[  ];  or itching[  ]; Musculosketetal: myalgias[  ];  joint  swelling[  ];  joint erythema[  ];  joint pain[  ];  back pain[  ];  Heme/Lymph: bruising[  ];  bleeding[  ];  anemia[  ];  Neuro: TIA[  ];  headaches[  ];  stroke[  ];  vertigo[  ];  seizures[  ];   paresthesias[  ];  difficulty walking[  ];  Psych:depression[  ]; anxiety[  ];  Endocrine: diabetes[  ];  thyroid dysfunction[  ];  Immunizations: Flu [  ]; Pneumococcal[  ];  Other:    Past Medical History  Diagnosis Date  . Urinary incontinence     occasional  . Family history of anesthesia complication     pt's mother has hx. of post-op N/V  . Hypertension     under control with med., has been on med. since 09/2012  . Anxiety   . Breast cancer     left    Current Outpatient Prescriptions  Medication Sig Dispense Refill  . ALPRAZolam (XANAX) 0.25 MG tablet Take 1 tablet (0.25 mg total) by mouth at bedtime as needed for sleep.  30 tablet  2  . dexamethasone (DECADRON) 4 MG tablet Take 2 tablets by mouth once a day on the day after chemotherapy and then take 2 tablets two times a day for 2 days. Take with food.  30 tablet  1  . HYDROcodone-acetaminophen (NORCO/VICODIN) 5-325 MG per tablet Take 1-2 tablets by mouth every 4 (four) hours as needed.  60 tablet  0  . lidocaine-prilocaine (EMLA) cream Apply topically as needed.  30 g  0  . lisinopril (PRINIVIL,ZESTRIL) 10 MG tablet Take 10 mg by mouth daily.      Marland Kitchen LORazepam (ATIVAN) 0.5 MG tablet Take 1 tablet (0.5 mg total) by mouth every 6 (six) hours as needed (Nausea or vomiting).  30 tablet  0  . omeprazole (PRILOSEC) 20 MG capsule Take 1 capsule (20 mg total) by mouth daily.  30 capsule  1  . ondansetron (ZOFRAN)  8 MG tablet Take 1 tablet (8 mg total) by mouth 2 (two) times daily as needed. Take two times a day as needed for nausea or vomiting starting on the third day after chemotherapy.  30 tablet  1  . prochlorperazine (COMPAZINE) 10 MG tablet Take 1 tablet (10 mg total) by mouth every 6 (six) hours as needed (Nausea or vomiting).  30 tablet  1   No current facility-administered medications for this encounter.     Allergies  Allergen Reactions  . Amoxicillin Other (See Comments)    States "it does not work"    History   Social History  . Marital Status: Divorced    Spouse Name: N/A    Number of Children: N/A  . Years of Education: N/A   Occupational History  . Not on file.   Social History Main Topics  . Smoking status: Former Smoker    Quit date: 12/03/2012  . Smokeless tobacco: Never Used  . Alcohol Use: Yes     Comment: occasionally  . Drug Use: No  . Sexual Activity: Yes    Birth Control/ Protection: Condom   Other Topics Concern  . Not on file   Social History Narrative  . No narrative on file    Family History  Problem Relation Age of Onset  . Breast cancer Maternal Grandmother   . Lung cancer Paternal Grandmother   . Anesthesia problems Mother     post-op N/V    PHYSICAL EXAM: Filed Vitals:  03/09/13 1143  BP: 108/66  Pulse: 95   General:  Well appearing. No respiratory difficulty HEENT: normal x for alopecia Neck: supple. no JVD. Carotids 2+ bilat; no bruits. No lymphadenopathy or thryomegaly appreciated. Cor: PMI nondisplaced. Regular rate & rhythm. No rubs, gallops 2/6 SEM at LSB Lungs: clear Abdomen: soft, nontender, nondistended. No hepatosplenomegaly. No bruits or masses. Good bowel sounds. Extremities: no cyanosis, clubbing, rash, tr edema on RLE Neuro: alert & oriented x 3, cranial nerves grossly intact. moves all 4 extremities w/o difficulty. Affect pleasant.    No results found for this or any previous visit (from the past 24 hour(s)). No  results found.   ASSESSMENT & PLAN: 1. L breast cancer- grade 3, estrogen receptor/progesterone receptor positive, Ki-67 25%, HER-2/neu negative. 4/6 cycles  FEC (5-FU/Epirubicin/Cytoxan) with Neulasta support to be given every 2 weeks.  Followed by Taxol for 12 weeks.  Dr Gala Romney reviewed discussed ECHOs.   Patient seen and examined with Tonye Becket, NP. We discussed all aspects of the encounter. I agree with the assessment and plan as stated above.  I have reviewed both her echos and LV function and other parameters are very stable. We talked about the small risk of cardiotoxicity with epirubicin. There is no evidence of this currently. We also discussed that this can be a delayed phenomenon. We will plan to see her back in 3-4 months after her epirubicin is finished with a repeat echo.  Daniel Bensimhon,MD 12:50 PM

## 2013-03-09 NOTE — Addendum Note (Signed)
Encounter addended by: Noralee Space, RN on: 03/09/2013 12:53 PM<BR>     Documentation filed: Patient Instructions Section

## 2013-03-09 NOTE — Patient Instructions (Signed)
We will contact you in 4 months to schedule your next appointment.  

## 2013-03-10 ENCOUNTER — Telehealth: Payer: Self-pay | Admitting: *Deleted

## 2013-03-10 NOTE — Telephone Encounter (Signed)
Pt given rx for missed days at work/school due to treatment on 12/6, 11/22, 11/8 by Camelia Eng, RN.

## 2013-03-11 ENCOUNTER — Other Ambulatory Visit: Payer: Self-pay | Admitting: *Deleted

## 2013-03-11 DIAGNOSIS — C50319 Malignant neoplasm of lower-inner quadrant of unspecified female breast: Secondary | ICD-10-CM

## 2013-03-13 ENCOUNTER — Encounter: Payer: Self-pay | Admitting: Adult Health

## 2013-03-13 ENCOUNTER — Other Ambulatory Visit (HOSPITAL_BASED_OUTPATIENT_CLINIC_OR_DEPARTMENT_OTHER): Payer: BC Managed Care – PPO

## 2013-03-13 ENCOUNTER — Ambulatory Visit (HOSPITAL_BASED_OUTPATIENT_CLINIC_OR_DEPARTMENT_OTHER): Payer: BC Managed Care – PPO | Admitting: Adult Health

## 2013-03-13 VITALS — BP 109/68 | HR 111 | Temp 98.2°F | Resp 18 | Ht 69.0 in | Wt 249.0 lb

## 2013-03-13 DIAGNOSIS — C50312 Malignant neoplasm of lower-inner quadrant of left female breast: Secondary | ICD-10-CM

## 2013-03-13 DIAGNOSIS — Z170 Estrogen receptor positive status [ER+]: Secondary | ICD-10-CM

## 2013-03-13 DIAGNOSIS — D702 Other drug-induced agranulocytosis: Secondary | ICD-10-CM

## 2013-03-13 DIAGNOSIS — D709 Neutropenia, unspecified: Secondary | ICD-10-CM

## 2013-03-13 DIAGNOSIS — C50319 Malignant neoplasm of lower-inner quadrant of unspecified female breast: Secondary | ICD-10-CM

## 2013-03-13 LAB — CBC WITH DIFFERENTIAL/PLATELET
Basophils Absolute: 0 10*3/uL (ref 0.0–0.1)
EOS%: 0.6 % (ref 0.0–7.0)
HCT: 27.3 % — ABNORMAL LOW (ref 34.8–46.6)
HGB: 9.5 g/dL — ABNORMAL LOW (ref 11.6–15.9)
LYMPH%: 83.6 % — ABNORMAL HIGH (ref 14.0–49.7)
MCH: 32.2 pg (ref 25.1–34.0)
MCHC: 34.7 g/dL (ref 31.5–36.0)
MCV: 92.8 fL (ref 79.5–101.0)
MONO%: 5.6 % (ref 0.0–14.0)
NEUT#: 0.1 10*3/uL — CL (ref 1.5–6.5)
NEUT%: 10.1 % — ABNORMAL LOW (ref 38.4–76.8)
Platelets: 191 10*3/uL (ref 145–400)
lymph#: 1.1 10*3/uL (ref 0.9–3.3)

## 2013-03-13 LAB — COMPREHENSIVE METABOLIC PANEL (CC13)
ALT: 22 U/L (ref 0–55)
AST: 9 U/L (ref 5–34)
Albumin: 3.4 g/dL — ABNORMAL LOW (ref 3.5–5.0)
Anion Gap: 8 mEq/L (ref 3–11)
Chloride: 105 mEq/L (ref 98–109)
Creatinine: 0.7 mg/dL (ref 0.6–1.1)
Sodium: 139 mEq/L (ref 136–145)
Total Bilirubin: 0.94 mg/dL (ref 0.20–1.20)
Total Protein: 5.9 g/dL — ABNORMAL LOW (ref 6.4–8.3)

## 2013-03-13 MED ORDER — CIPROFLOXACIN HCL 500 MG PO TABS: 500.0000 mg | ORAL_TABLET | Freq: Two times a day (BID) | ORAL | Status: DC

## 2013-03-13 NOTE — Patient Instructions (Signed)
  Patient Neutropenia Instruction Sheet  Diagnosis: Breast Cancer      Treating Physician: Drue Second, MD  Treatment: 1. Type of chemotherapy: FEC  2. Date of last treatment: 03/06/13  Last Blood Counts: Lab Results  Component Value Date   WBC 1.3* 03/13/2013   HGB 9.5* 03/13/2013   HCT 27.3* 03/13/2013   MCV 92.8 03/13/2013   PLT 191 03/13/2013   ANC 100     Prophylactic Antibiotics: Cipro 500 mg by mouth twice a day Instructions: 1. Monitor temperature and call if fever  greater than 100.5, chills, shaking chills (rigors) 2. Call Physician on-call at [phone] 3. Give him/her symptoms and list of medications that you are taking and your last blood count.

## 2013-03-13 NOTE — Progress Notes (Signed)
[facility]  [phone] [phone]  OFFICE PROGRESS NOTE  ID: [name]   DOB: 02/26/1976  MR#: [id number]  ZHY#:[id number]   PCP: Stacie L. [place] SU:  Griselda Miner, MD  RAD ONC:  Chipper Herb, MD   DIAGNOSIS: [name] is a 37 y.o. 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  She was seen in the multidisciplinary breast clinic on 10/23/2012 for discussion of treatment options.    STAGE:  Cancer of lower-inner quadrant of female breast  Primary site: Breast (Left)  Staging method: AJCC 7th Edition  Clinical: Stage IA (T1c, N0, cM0)  Summary: Stage IA (T1c, N0, cM0)   PRIOR THERAPY: #1  A screening mammogram in [place], [place] on 09/23/2012 she was felt to have a new mass within the left breast. An ultrasound on 10/06/2012 showed a 1.6 x 1.3 x 1.3 cm mass at the 8 o'clock position 7 cm from the nipple. At the 12 o'clock position behind and nipple was a circumscribed oval shaped 2 x 1.8 x 1.2 cm lesion perhaps representing a fibroadenoma. 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 The carcinoma was grade 2-3, estrogen receptor 80% positive, progesterone receptor 80% positive, Ki-67 25%, and HER-2/neu negative. Bilateral breast MRI on 10/22/2012 showed biopsy proven carcinoma in the left lower inner quadrant identified and measures approximately 1.9 x 1.9 x 1.8 cm.  Well-defined nodule in the right upper outer quadrant posteriorly consistent with a fibroadenoma. Probable liver cysts.There were multiple scattered nonspecific foci in each breast. No other suspicious abnormality was identified. No abnormal appearing lymph nodes (Clinical stage I, T1 N0).   #2 Status post left breast lumpectomy with left axillary sentinel lymph node biopsy on 12/10/2012 for a stage IIB, pT2,  pN76mi, MX, 2.3 cm invasive ductal carcinoma, grade 3, estrogen receptor 80% positive, progesterone receptor 80% positive, Ki-67 25%, HER-2/neu negative, with 1/2 left axillary sentinel lymph nodes positive for microscopic disease.   #3 Genetic testing performed in [place], [place] with self reported by the patient as BRCA1 and BRCA2 negative.  #4 Adjuvant chemotherapy consisting of FEC (5-FU/epirubicin/Cytoxan) began on 01/23/2013. Six cycles of FEC is planned. FEC will then be followed by weekly adjuvant chemotherapy consisting of Taxol for 12 weeks.   CURRENT THERAPY:  FEC cycle 4 day 8  INTERVAL HISTORY: [name] is a  37 y.o. female who returns today for evaluation following her fourth cycle of FEC chemotherapy.   She is doing well today.  She is neutropenic.  She denies fevers, chills, nausea, vomiting, constipation, diarrhea, numbness, shortness of breath, chest pain, or any further concerns.  She did see Dr. Gala Romney on 03/09/13 who reviewed her echocardiograms and cleared her to continue Epirubicin.  Otherwise, a 10 point ROS is negative.    MEDICAL HISTORY: Past Medical History  Diagnosis Date  . Urinary incontinence     occasional  . Family history of anesthesia complication     pt's mother has hx. of post-op N/V  . Hypertension     under control with med., has been on med. since 09/2012  . Anxiety   . Breast cancer     left    ALLERGIES:   Allergies  Allergen Reactions  . Amoxicillin Other (See Comments)    States "it does not work"    MEDICATIONS:  Current Outpatient Prescriptions  Medication Sig Dispense Refill  . ALPRAZolam (XANAX) 0.25 MG tablet Take 1 tablet (0.25 mg total) by mouth at bedtime as needed for sleep.  30 tablet  2  . dexamethasone (DECADRON) 4 MG tablet Take 2 tablets by mouth once a day on the day after chemotherapy and then take 2 tablets two times a day for 2 days. Take with food.  30 tablet  1  . HYDROcodone-acetaminophen (NORCO/VICODIN)  5-325 MG per tablet Take 1-2 tablets by mouth every 4 (four) hours as needed.  60 tablet  0  . lidocaine-prilocaine (EMLA) cream Apply topically as needed.  30 g  0  . lisinopril (PRINIVIL,ZESTRIL) 10 MG tablet Take 10 mg by mouth daily.      Marland Kitchen LORazepam (ATIVAN) 0.5 MG tablet Take 1 tablet (0.5 mg total) by mouth every 6 (six) hours as needed (Nausea or vomiting).  30 tablet  0  . omeprazole (PRILOSEC) 20 MG capsule Take 1 capsule (20 mg total) by mouth daily.  30 capsule  1  . ondansetron (ZOFRAN) 8 MG tablet Take 1 tablet (8 mg total) by mouth 2 (two) times daily as needed. Take two times a day as needed for nausea or vomiting starting on the third day after chemotherapy.  30 tablet  1  . prochlorperazine (COMPAZINE) 10 MG tablet Take 1 tablet (10 mg total) by mouth every 6 (six) hours as needed (Nausea or vomiting).  30 tablet  1  . ciprofloxacin (CIPRO) 500 MG tablet Take 1 tablet (500 mg total) by mouth 2 (two) times daily.  14 tablet  2   No current facility-administered medications for this visit.    SURGICAL HISTORY:  Past Surgical History  Procedure Laterality Date  . Cesarean section  1999  . Breast lumpectomy with needle localization and axillary sentinel lymph node bx Left 12/10/2012    Procedure: BREAST LUMPECTOMY WITH NEEDLE LOCALIZATION AND AXILLARY SENTINEL LYMPH NODE BX;  Surgeon: Robyne Askew, MD;  Location: [facility] OR;  Service: General;  Laterality: Left;  Needle loc BCG 7:30  nuc med 9:30    . Tonsillectomy and adenoidectomy      as a teenager  . Cholecystectomy  1999  . Open reduction internal fixation (orif) tibia/fibula fracture Right   . Portacath placement Right 01/09/2013    Procedure: INSERTION PORT-A-CATH;  Surgeon: Robyne Askew, MD;  Location: [facility];  Service: General;  Laterality: Right;    REVIEW OF SYSTEMS:  A 10 point review of systems was completed and is negative except as noted above.    PHYSICAL EXAMINATION: Blood pressure  109/68, pulse 111, temperature 98.2 F (36.8 C), temperature source Oral, resp. rate 18, height 5\' 9"  (1.753 m), weight 249 lb (112.946 kg). Body mass index is 36.75 kg/(m^2). General: Patient is a well appearing female in no acute distress HEENT: PERRLA, sclerae anicteric no conjunctival pallor, MMM Neck: supple, no palpable adenopathy Lungs: clear to auscultation bilaterally, no wheezes, rhonchi, or rales Cardiovascular: regular rate rhythm, S1, S2, no murmurs, rubs or gallops Abdomen: Soft, non-tender, non-distended, normoactive bowel sounds, no HSM Extremities: warm and well perfused, no clubbing, cyanosis, or edema Skin: No rashes or lesions Neuro: Non-focal ECOG PERFORMANCE STATUS: 0 - Asymptomatic   LABORATORY DATA: Lab Results  Component Value Date   WBC 1.3* 03/13/2013   HGB 9.5* 03/13/2013   HCT 27.3* 03/13/2013   MCV 92.8 03/13/2013   PLT 191 03/13/2013      Chemistry  Component Value Date/Time   NA 139 03/13/2013 1053   NA 136 01/23/2013 1413   K 3.9 03/13/2013 1053   K 3.7 01/23/2013 1413   CL 102 01/23/2013 1413   CO2 26 03/13/2013 1053   CO2 27 01/23/2013 1413   BUN 24.4 03/13/2013 1053   BUN 12 01/23/2013 1413   CREATININE 0.7 03/13/2013 1053   CREATININE 0.68 01/23/2013 1413      Component Value Date/Time   CALCIUM 8.7 03/13/2013 1053   CALCIUM 9.3 01/23/2013 1413   ALKPHOS 90 03/13/2013 1053   ALKPHOS 74 01/23/2013 1413   AST 9 03/13/2013 1053   AST 27 01/23/2013 1413   ALT 22 03/13/2013 1053   ALT 30 01/23/2013 1413   BILITOT 0.94 03/13/2013 1053   BILITOT 0.5 01/23/2013 1413      RADIOGRAPHIC STUDIES: No results found.   ASSESSMENT:   Chery Giusto is a 37 y.o. [place], [place] female with:  #1 Stage II, invasive ductal carcinoma of the left breast, grade 3, estrogen receptor/progesterone receptor positive, Ki-67 25%, HER-2/neu negative. Status post left breast lumpectomy with left axillary sentinel lymph node biopsy on 12/10/2012 with 1 of 2  sentinel nodes revealing microscopic disease. The patient elected not to receive axillary lymph node dissection and will undergo radiation therapy after the completion of chemotherapy.    #2 Started adjuvant chemotherapy on 01/23/2013 consisting of FEC (5-FU/Epirubicin/Cytoxan) with Neulasta support to be given every 2 weeks for a total of 6 cycles.  This will be followed by adjuvant chemotherapy consisting of Taxol on a weekly basis for 12 weeks. Port-A-Cath placement on 01/09/2013, 2D echocardiogram with LVEF of 50% - 55%  and chemotherapy class on 01/12/2013. She underwent a repeat echocardiogram on 03/05/13 that demonstrated an improvement in her LVEF to 55-60%, she was evaluated by Dr. Gala Romney on 03/09/13 who cleared her to continue with Epirubicin therapy, and will f/u with her in 3 months.    PLAN:  #1  Patient is doing well today.  She is neutropenic today and we will start Cipro again.  I prescribed this for her.    I reviewed neutropenic precautions with her in detail and gave her a copy in her AVS.    #2 She will return in one week for labs, evaluation, and chemotherapy.    All questions answered. Ms. Yingling was encouraged to contact us in the interim with any questions, concerns, or problems  I spent 25 minutes counseling the patient face to face.  The total time spent in the appointment was 30 minutes.  Illa Level, NP Medical Oncology [facility] [phone] 03/14/2013, 9:10 AM

## 2013-03-16 ENCOUNTER — Other Ambulatory Visit: Payer: Self-pay | Admitting: Hematology & Oncology

## 2013-03-16 ENCOUNTER — Telehealth: Payer: Self-pay | Admitting: *Deleted

## 2013-03-16 ENCOUNTER — Other Ambulatory Visit: Payer: Self-pay | Admitting: Emergency Medicine

## 2013-03-16 DIAGNOSIS — I82402 Acute embolism and thrombosis of unspecified deep veins of left lower extremity: Secondary | ICD-10-CM

## 2013-03-16 DIAGNOSIS — C50319 Malignant neoplasm of lower-inner quadrant of unspecified female breast: Secondary | ICD-10-CM

## 2013-03-16 MED ORDER — RIVAROXABAN 15 MG PO TABS: 15.0000 mg | ORAL_TABLET | Freq: Two times a day (BID) | ORAL | Status: DC

## 2013-03-16 NOTE — Progress Notes (Signed)
I received a phone call from [facility]. A Doppler was done on her leg. She has a DVT in the left popliteal vein.  I called her. I think that we could put her on Xarelto as if she does not have active cancer and has good liver function.  I called in the prescription of the Xarelto pharmacy in [place].  I left a message for Dr. Milta Deiters PA Mardella Layman, to see her tomorrow just to followup.  Ms. Donlon understands all this. With her be neutropenic, up for her not to go to the emergency room. I really feel that Xarelto would be appropriate and effective for her.  [name]

## 2013-03-16 NOTE — Telephone Encounter (Signed)
Patient called asking for orders for U/S be sent to [facility].  Fax number is [phone], phone: [phone].  Will notify providers.

## 2013-03-17 ENCOUNTER — Other Ambulatory Visit: Payer: Self-pay | Admitting: Adult Health

## 2013-03-17 ENCOUNTER — Ambulatory Visit (HOSPITAL_BASED_OUTPATIENT_CLINIC_OR_DEPARTMENT_OTHER): Payer: BC Managed Care – PPO | Admitting: Adult Health

## 2013-03-17 ENCOUNTER — Encounter: Payer: Self-pay | Admitting: Adult Health

## 2013-03-17 VITALS — BP 132/81 | HR 106 | Temp 98.3°F | Resp 18 | Ht 69.0 in | Wt 245.6 lb

## 2013-03-17 DIAGNOSIS — I82401 Acute embolism and thrombosis of unspecified deep veins of right lower extremity: Secondary | ICD-10-CM

## 2013-03-17 DIAGNOSIS — I82409 Acute embolism and thrombosis of unspecified deep veins of unspecified lower extremity: Secondary | ICD-10-CM

## 2013-03-17 DIAGNOSIS — C50312 Malignant neoplasm of lower-inner quadrant of left female breast: Secondary | ICD-10-CM

## 2013-03-17 NOTE — Progress Notes (Signed)
[facility]  [phone] Fax:(336) [phone]  OFFICE PROGRESS NOTE  ID: [name]   DOB: 1975/12/04  MR#: [id number]  ZHY#:[id number]   PCP: Stacie L. [place] SU:  Griselda Miner, MD  RAD ONC:  Chipper Herb, MD   DIAGNOSIS: [name] is a 37 y.o. 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  She was seen in the multidisciplinary breast clinic on 10/23/2012 for discussion of treatment options.    STAGE:  Cancer of lower-inner quadrant of female breast  Primary site: Breast (Left)  Staging method: AJCC 7th Edition  Clinical: Stage IA (T1c, N0, cM0)  Summary: Stage IA (T1c, N0, cM0)   PRIOR THERAPY: #1  A screening mammogram in [place], [place] on 09/23/2012 she was felt to have a new mass within the left breast. An ultrasound on 10/06/2012 showed a 1.6 x 1.3 x 1.3 cm mass at the 8 o'clock position 7 cm from the nipple. At the 12 o'clock position behind and nipple was a circumscribed oval shaped 2 x 1.8 x 1.2 cm lesion perhaps representing a fibroadenoma. 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 The carcinoma was grade 2-3, estrogen receptor 80% positive, progesterone receptor 80% positive, Ki-67 25%, and HER-2/neu negative. Bilateral breast MRI on 10/22/2012 showed biopsy proven carcinoma in the left lower inner quadrant identified and measures approximately 1.9 x 1.9 x 1.8 cm.  Well-defined nodule in the right upper outer quadrant posteriorly consistent with a fibroadenoma. Probable liver cysts.There were multiple scattered nonspecific foci in each breast. No other suspicious abnormality was identified. No abnormal appearing lymph nodes (Clinical stage I, T1 N0).   #2 Status post left breast lumpectomy with left axillary sentinel lymph node biopsy on 12/10/2012 for a stage IIB, pT2,  pN47mi, MX, 2.3 cm invasive ductal carcinoma, grade 3, estrogen receptor 80% positive, progesterone receptor 80% positive, Ki-67 25%, HER-2/neu negative, with 1/2 left axillary sentinel lymph nodes positive for microscopic disease.   #3 Genetic testing performed in [place], [place] with self reported by the patient as BRCA1 and BRCA2 negative.  #4 Adjuvant chemotherapy consisting of FEC (5-FU/epirubicin/Cytoxan) began on 01/23/2013. Six cycles of FEC is planned. FEC will then be followed by weekly adjuvant chemotherapy consisting of Taxol for 12 weeks.   CURRENT THERAPY:  FEC cycle 4 day 11  INTERVAL HISTORY: [name] is a  37 y.o. female who returns today for evaluation as a work in due to being diagnosed with a right lower extremity DVT.  She was c/o cramping on Monday and we ordered a doppler of her right lower extremity.  It was positive for a DVT and Dr. Myna Hidalgo who was on call was notified.  He placed the patient on Xarelto.  She continues to have pain in the right leg, but started taking that last night and is tolerating it well.  Otherwise, she denies any chest pain, shortness of breath, or any other concerns.    MEDICAL HISTORY: Past Medical History  Diagnosis Date  . Urinary incontinence     occasional  . Family history of anesthesia complication     pt's mother has hx. of post-op N/V  . Hypertension     under control with med., has been on med. since 09/2012  . Anxiety   . Breast cancer     left    ALLERGIES:   Allergies  Allergen Reactions  . Amoxicillin Other (See Comments)    States "it does not work"    MEDICATIONS:  Current Outpatient Prescriptions  Medication Sig Dispense Refill  . ALPRAZolam (XANAX) 0.25 MG tablet Take 1 tablet (0.25 mg total) by mouth at bedtime as needed for sleep.  30 tablet  2  . ciprofloxacin (CIPRO) 500 MG tablet Take 1 tablet (500 mg total) by mouth 2 (two) times daily.  14 tablet  2  . HYDROcodone-acetaminophen (NORCO/VICODIN)  5-325 MG per tablet Take 1-2 tablets by mouth every 4 (four) hours as needed.  60 tablet  0  . lidocaine-prilocaine (EMLA) cream Apply topically as needed.  30 g  0  . lisinopril (PRINIVIL,ZESTRIL) 10 MG tablet Take 10 mg by mouth daily.      Marland Kitchen LORazepam (ATIVAN) 0.5 MG tablet Take 1 tablet (0.5 mg total) by mouth every 6 (six) hours as needed (Nausea or vomiting).  30 tablet  0  . omeprazole (PRILOSEC) 20 MG capsule Take 1 capsule (20 mg total) by mouth daily.  30 capsule  1  . Rivaroxaban (XARELTO) 15 MG TABS tablet Take 1 tablet (15 mg total) by mouth 2 (two) times daily with a meal.  42 tablet  0  . dexamethasone (DECADRON) 4 MG tablet Take 2 tablets by mouth once a day on the day after chemotherapy and then take 2 tablets two times a day for 2 days. Take with food.  30 tablet  1  . ondansetron (ZOFRAN) 8 MG tablet Take 1 tablet (8 mg total) by mouth 2 (two) times daily as needed. Take two times a day as needed for nausea or vomiting starting on the third day after chemotherapy.  30 tablet  1  . prochlorperazine (COMPAZINE) 10 MG tablet Take 1 tablet (10 mg total) by mouth every 6 (six) hours as needed (Nausea or vomiting).  30 tablet  1   No current facility-administered medications for this visit.    SURGICAL HISTORY:  Past Surgical History  Procedure Laterality Date  . Cesarean section  1999  . Breast lumpectomy with needle localization and axillary sentinel lymph node bx Left 12/10/2012    Procedure: BREAST LUMPECTOMY WITH NEEDLE LOCALIZATION AND AXILLARY SENTINEL LYMPH NODE BX;  Surgeon: Robyne Askew, MD;  Location: [facility] OR;  Service: General;  Laterality: Left;  Needle loc BCG 7:30  nuc med 9:30    . Tonsillectomy and adenoidectomy      as a teenager  . Cholecystectomy  1999  . Open reduction internal fixation (orif) tibia/fibula fracture Right   . Portacath placement Right 01/09/2013    Procedure: INSERTION PORT-A-CATH;  Surgeon: Robyne Askew, MD;  Location: [facility];  Service: General;  Laterality: Right;    REVIEW OF SYSTEMS:  A 10 point review of systems was completed and is negative except as noted above.    PHYSICAL EXAMINATION: Blood pressure 132/81, pulse 106, temperature 98.3 F (36.8 C), temperature source Oral, resp. rate 18, height 5\' 9"  (1.753 m), weight 245 lb 9.6 oz (111.403 kg). Body mass index is 36.25 kg/(m^2). General: Patient is a well appearing female in no acute distress HEENT: PERRLA, sclerae anicteric no conjunctival pallor, MMM Neck: supple, no palpable adenopathy Lungs: clear to auscultation bilaterally, no wheezes, rhonchi, or rales Cardiovascular: regular rate rhythm, S1, S2, no murmurs, rubs or gallops Abdomen: Soft, non-tender, non-distended, normoactive bowel sounds, no HSM Extremities: warm and well perfused, no clubbing, cyanosis, or edema Skin: No  rashes or lesions Neuro: Non-focal ECOG PERFORMANCE STATUS: 0 - Asymptomatic   LABORATORY DATA: Lab Results  Component Value Date   WBC 1.3* 03/13/2013   HGB 9.5* 03/13/2013   HCT 27.3* 03/13/2013   MCV 92.8 03/13/2013   PLT 191 03/13/2013      Chemistry      Component Value Date/Time   NA 139 03/13/2013 1053   NA 136 01/23/2013 1413   K 3.9 03/13/2013 1053   K 3.7 01/23/2013 1413   CL 102 01/23/2013 1413   CO2 26 03/13/2013 1053   CO2 27 01/23/2013 1413   BUN 24.4 03/13/2013 1053   BUN 12 01/23/2013 1413   CREATININE 0.7 03/13/2013 1053   CREATININE 0.68 01/23/2013 1413      Component Value Date/Time   CALCIUM 8.7 03/13/2013 1053   CALCIUM 9.3 01/23/2013 1413   ALKPHOS 90 03/13/2013 1053   ALKPHOS 74 01/23/2013 1413   AST 9 03/13/2013 1053   AST 27 01/23/2013 1413   ALT 22 03/13/2013 1053   ALT 30 01/23/2013 1413   BILITOT 0.94 03/13/2013 1053   BILITOT 0.5 01/23/2013 1413      RADIOGRAPHIC STUDIES: No results found.   ASSESSMENT:   [name] is a 37 y.o. [place], [place] female with:  #1 Stage II, invasive ductal carcinoma of the left  breast, grade 3, estrogen receptor/progesterone receptor positive, Ki-67 25%, HER-2/neu negative. Status post left breast lumpectomy with left axillary sentinel lymph node biopsy on 12/10/2012 with 1 of 2 sentinel nodes revealing microscopic disease. The patient elected not to receive axillary lymph node dissection and will undergo radiation therapy after the completion of chemotherapy.    #2 Started adjuvant chemotherapy on 01/23/2013 consisting of FEC (5-FU/Epirubicin/Cytoxan) with Neulasta support to be given every 2 weeks for a total of 6 cycles.  This will be followed by adjuvant chemotherapy consisting of Taxol on a weekly basis for 12 weeks. Port-A-Cath placement on 01/09/2013, 2D echocardiogram with LVEF of 50% - 55%  and chemotherapy class on 01/12/2013. She underwent a repeat echocardiogram on 03/05/13 that demonstrated an improvement in her LVEF to 55-60%, she was evaluated by Dr. Gala Romney on 03/09/13 who cleared her to continue with Epirubicin therapy, and will f/u with her in 3 months.    PLAN:  #1  Patient is doing well today.  I encouraged her to continue Xarelto.  I gave her detailed information about DVT and Xarelto in her AVS.  Should she develop any dyspnea, chest pain, or shortness of breath I recommended she go to the emergency room.  She knows to call us if she notices blood in her stool, sputum, from her gums, nose, or vaginally.  I recommended she elevate her leg, and have light activity and we will see how she is doing on Friday, 03/20/13 when she comes for her next treatment.    #2 She will return on 03/20/13 for labs, evaluation, and FEC.    All questions answered. [name] was encouraged to contact us in the interim with any questions, concerns, or problems  I spent 25 minutes counseling the patient face to face.  The total time spent in the appointment was 30 minutes.  Theresa Level, NP Medical Oncology [facility] [phone] 03/17/2013, 3:34 PM

## 2013-03-17 NOTE — Patient Instructions (Signed)
Deep Vein Thrombosis A deep vein thrombosis (DVT) is a blood clot that develops in a deep vein. A DVT is a clot in the deep, larger veins of the leg, arm, or pelvis. These are more dangerous than clots that might form in veins near the surface of the body. A DVT can lead to complications if the clot breaks off and travels in the bloodstream to the lungs.  A DVT can damage the valves in your leg veins, so that instead of flowing upwards, the blood pools in the lower leg. This is called post-thrombotic syndrome, and can result in pain, swelling, discoloration, and sores on the leg. Once identified, a DVT can be treated. It can also be prevented in some circumstances. Once you have had a DVT, you may be at increased risk for a DVT in the future. CAUSES Blood clots form in a vein for different reasons. Usually several things contribute to blood clots. Contributing factors include:  The flow of blood slows down.  The inside of the vein is damaged in some way.  The person has a condition that makes blood clot more easily. Some people are more likely than others to develop blood clots. That is because they have more factors that make clots likely. These are called risk factors. Risk factors include:   Older age, especially over 75 years old.  Having a history of blood clots. This means you have had one before. Or, it means that someone else in your family has had blood clots. You may have a genetic tendency to form clots.  Having major or lengthy surgery. This is especially true for surgery on the hip, knee, or belly (abdomen). Hip surgery is particularly high risk.  Breaking a hip or leg.  Sitting or lying still for a long time. This includes long distance travel, paralysis, or recovery from an illness or surgery.  Cancer, or cancer treatment.  Having a long, thin tube (catheter) placed inside a vein during a medical procedure.  Being overweight (obese).  Pregnancy and childbirth. Hormone  changes make the blood clot more easily during pregnancy. The fetus puts pressure on the veins of the pelvis. There is also risk of injury to veins during delivery or a caesarean. The risk is at its highest just after childbirth.  Medicines with the female hormone estrogen. This includes birth control pills and hormone replacement therapy.  Smoking.  Other circulation or heart problems. SYMPTOMS When a clot forms, it can either partially or totally block the blood flow in that vein. Symptoms of a DVT can include:  Swelling of the leg or arm, especially if one side is much worse.  Warmth and redness of the leg or arm, especially if one side is much worse.  Pain in an arm or leg. If the clot is in the leg, symptoms may be more noticeable or worse when standing or walking. The symptoms of a DVT that has traveled to the lungs (pulmonary embolism, PE) usually start suddenly, and include:  Shortness of breath.  Coughing.  Coughing up blood or blood-tinged phlegm.  Chest pain. The chest pain is often worse with deep breaths.  Rapid heartbeat. Anyone with these symptoms should get emergency medical treatment right away. Call your local emergency services (911 in [place] if you have these symptoms. DIAGNOSIS If a DVT is suspected, your caregiver will take a full medical history and carry out a physical exam. Tests that also may be required include:  Blood tests, including studies of   the clotting properties of the blood.  Ultrasonography to see if you have clots in your legs or lungs.  X-rays to show the flow of blood when dye is injected into the veins (venography).  Studies of your lungs, if you have any chest symptoms. PREVENTION  Exercise the legs regularly. Take a brisk 30 minute walk every day.  Maintain a weight that is appropriate for your height.  Avoid sitting or lying in bed for long periods of time without moving your legs.  Women, particularly those over the age of 35,  should consider the risks and benefits of taking estrogen medicines, including birth control pills.  Do not smoke, especially if you take estrogen medicines.  Long distance travel can increase your risk of DVT. You should exercise your legs by walking or pumping the muscles every hour.  In-hospital prevention:  Many of the risk factors above relate to situations that exist with hospitalization, either for illness, injury, or elective surgery.  Your caregiver will assess you for the need for venous thromboembolism prophylaxis when you are admitted to the hospital. If you are having surgery, your surgeon will assess you the day of or day after surgery.  Prevention may include medical and nonmedical measures. TREATMENT Treatment for DVT helps prevent death and disability. The most common treatment for DVT is blood thinning (anticoagulant) medicine, which reduces the blood's tendency to clot. Anticoagulants can stop new blood clots from forming and old ones from growing. They cannot dissolve existing clots. Your body does this by itself over time. Anticoagulants can be given by mouth, by intravenous (IV) access, or by injection. Your caregiver will determine the best program for you.  Heparin or related medicines (low molecular weight heparin) are usually the first treatment for a blood clot. They act quickly. However, they cannot be taken orally.  Heparin can cause a fall in a component of blood that stops bleeding and forms blood clots (platelets). You will be monitored with blood tests to be sure this does not occur.  Warfarin is an anticoagulant that can be swallowed (taken orally). It takes a few days to start working, so usually heparin or related medicines are used in combination. Once warfarin is working, heparin is usually stopped.  Less commonly, clot dissolving drugs (thrombolytics) are used to dissolve a DVT. They carry a high risk of bleeding, so they are used mainly in severe cases,  where a life or limb is threatened.  Very rarely, a blood clot in the leg needs to be removed surgically.  If you are unable to take anticoagulants, your caregiver may arrange for you to have a filter placed in a main vein in your belly (abdomen). This filter prevents clots from traveling to your lungs. HOME CARE INSTRUCTIONS  Take all medicines prescribed by your caregiver. Follow the directions carefully.  Warfarin. Most people will continue taking warfarin after hospital discharge. Your caregiver will advise you on the length of treatment (usually 3 6 months, sometimes lifelong).  Too much and too little warfarin are both dangerous. Too much warfarin increases the risk of bleeding. Too little warfarin continues to allow the risk for blood clots. While taking warfarin, you will need to have regular blood tests to measure your blood clotting time. These blood tests usually include both the prothrombin time (PT) and international normalized ratio (INR) tests. The PT and INR results allow your caregiver to adjust your dose of warfarin. The dose can change for many reasons. It is critically important that   you take warfarin exactly as prescribed, and that you have your PT and INR levels drawn exactly as directed.  Many foods, especially foods high in vitamin K can interfere with warfarin and affect the PT and INR results. Foods high in vitamin K include spinach, kale, broccoli, cabbage, collard and turnip greens, brussels sprouts, peas, cauliflower, seaweed, and parsley as well as beef and pork liver, green tea, and soybean oil. You should eat a consistent amount of foods high in vitamin K. Avoid major changes in your diet, or notify your caregiver before changing your diet. Arrange a visit with a dietitian to answer your questions.  Many medicines can interfere with warfarin and affect the PT and INR results. You must tell your caregiver about any and all medicines you take, this includes all vitamins  and supplements. Be especially cautious with aspirin and anti-inflammatory medicines. Ask your caregiver before taking these. Do not take or discontinue any prescribed or over-the-counter medicine except on the advice of your caregiver or pharmacist.  Warfarin can have side effects, primarily excessive bruising or bleeding. You will need to hold pressure over cuts for longer than usual. Your caregiver or pharmacist will discuss other potential side effects.  Alcohol can change the body's ability to handle warfarin. It is best to avoid alcoholic drinks or consume only very small amounts while taking warfarin. Notify your caregiver if you change your alcohol intake.  Notify your dentist or other caregivers before procedures.  Activity. Ask your caregiver how soon you can go back to normal activities. It is important to stay active to prevent blood clots. If you are on anticoagulant medicine, avoid contact sports.  Exercise. It is very important to exercise. This is especially important while traveling, sitting or standing for long periods of time. Exercise your legs by walking or by pumping the muscles frequently. Take frequent walks.  Compression stockings. These are tight elastic stockings that apply pressure to the lower legs. This pressure can help keep the blood in the legs from clotting. You may need to wear compressions stockings at home to help prevent a DVT.  Smoking. If you smoke, quit. Ask your caregiver for help with quitting smoking.  Learn as much as you can about DVT. Knowing more about the condition should help you keep it from coming back.  Wear a medical alert bracelet or carry a medical alert card. SEEK MEDICAL CARE IF:  You notice a rapid heartbeat.  You feel weaker or more tired than usual.  You feel faint.  You notice increased bruising.  You feel your symptoms are not getting better in the time expected.  You believe you are having side effects of medicine. SEEK  IMMEDIATE MEDICAL CARE IF:  You have chest pain.  You have trouble breathing.  You have new or increased swelling or pain in one leg.  You cough up blood.  You notice blood in vomit, in a bowel movement, or in urine. MAKE SURE YOU:  Understand these instructions.  Will watch your condition.  Will get help right away if you are not doing well or get worse. Document Released: 03/05/2005 Document Revised: 11/28/2011 Document Reviewed: 04/27/2010 [facility] Patient Information 2014 [place], [place].  Rivaroxaban oral tablets What is this medicine? RIVAROXABAN (ri va ROX a ban) is an anticoagulant (blood thinner). It is used to treat blood clots in the lungs or in the veins. It is also used after knee or hip surgeries to prevent blood clots. It is also used to lower  the chance of stroke in people with a medical condition called atrial fibrillation. This medicine may be used for other purposes; ask your health care provider or pharmacist if you have questions. COMMON BRAND NAME(S): Xarelto What should I tell my health care provider before I take this medicine? They need to know if you have any of these conditions: -bleeding disorders -bleeding in the brain -blood in your stools (black or tarry stools) or if you have blood in your vomit -history of stomach bleeding -kidney disease -liver disease -low blood counts, like low white cell, platelet, or red cell counts -recent or planned spinal or epidural procedure -take medicines that treat or prevent blood clots -an unusual or allergic reaction to rivaroxaban, other medicines, foods, dyes, or preservatives -pregnant or trying to get pregnant -breast-feeding How should I use this medicine? Take this medicine by mouth with a glass of water. Follow the directions on the prescription label. Take your medicine at regular intervals. Do not take it more often than directed. Do not stop taking except on your doctor's advice. Stopping this  medicine may increase your risk of a blot clot. Be sure to refill your prescription before you run out of medicine. If you are taking this medicine after hip or knee replacement surgery, take it with or without food. If you are taking this medicine for atrial fibrillation, take it with your evening meal. If you are taking this medicine to treat blood clots, take it with food at the same time each day. If you are unable to swallow your tablet, you may crush the tablet and mix it in applesauce. Then, immediately eat the applesauce. You should eat more food right after you eat the applesauce containing the crushed tablet. Talk to your pediatrician regarding the use of this medicine in children. Special care may be needed. Overdosage: If you think you have taken too much of this medicine contact a poison control center or emergency room at once. NOTE: This medicine is only for you. Do not share this medicine with others. What if I miss a dose? If you take your medicine once a day and miss a dose, take the missed dose as soon as you remember. If you take your medicine twice a day and miss a dose, take the missed dose immediately. In this instance, 2 tablets may be taken at the same time. The next day you should take 1 tablet twice a day as directed. What may interact with this medicine? -aspirin and aspirin-like medicines -certain antibiotics like erythromycin, azithromycin, and clarithromycin -certain medicines for fungal infections like ketoconazole and itraconazole -certain medicines for irregular heart beat like amiodarone, quinidine, dronedarone -certain medicines for seizures like carbamazepine, phenytoin -certain medicines that treat or prevent blood clots like warfarin, enoxaparin, and dalteparin  -conivaptan -diltiazem -felodipine -indinavir -lopinavir; ritonavir -NSAIDS, medicines for pain and inflammation, like ibuprofen or naproxen -ranolazine -rifampin -ritonavir -[facility] John's  wort -verapamil This list may not describe all possible interactions. Give your health care provider a list of all the medicines, herbs, non-prescription drugs, or dietary supplements you use. Also tell them if you smoke, drink alcohol, or use illegal drugs. Some items may interact with your medicine. What should I watch for while using this medicine? Visit your doctor or health care professional for regular checks on your progress. Your condition will be monitored carefully while you are receiving this medicine. If you are going to have surgery, tell your doctor or health care professional that you are taking this  medicine. Avoid sports and activities that might cause injury while you are using this medicine. Severe falls or injuries can cause unseen bleeding. Be careful when using sharp tools or knives. Consider using an Neurosurgeon. Take special care brushing or flossing your teeth. Report any injuries, bruising, or red spots on the skin to your doctor or health care professional. What side effects may I notice from receiving this medicine? Side effects that you should report to your doctor or health care professional as soon as possible: -allergic reactions like skin rash, itching or hives, swelling of the face, lips, or tongue -back pain -confusion, trouble speaking or understanding -redness, blistering, peeling or loosening of the skin, including inside the mouth -signs and symptoms of bleeding such as bloody or black, tarry stools; red or dark-brown urine; spitting up blood or brown material that looks like coffee grounds; red spots on the skin; unusual bruising or bleeding from the eye, gums, or nose -signs and symptoms of a blood clot such as breathing problems; changes in vision; chest pain; severe, sudden headache; pain, swelling, warmth in the leg; trouble speaking; sudden numbness or weakness of the face, arm, or leg -trouble walking, dizziness, loss of balance or coordination  Side  effects that usually do not require medical attention (Report these to your doctor or health care professional if they continue or are bothersome.): -dizziness -muscle pain This list may not describe all possible side effects. Call your doctor for medical advice about side effects. You may report side effects to FDA at 1-800-FDA-1088. Where should I keep my medicine? Keep out of the reach of children. Store at room temperature between 15 and 30 degrees C (59 and 86 degrees F). Throw away any unused medicine after the expiration date. NOTE: This sheet is a summary. It may not cover all possible information. If you have questions about this medicine, talk to your doctor, pharmacist, or health care provider.  2014, [employer]. (2012-08-20 09:51:31)

## 2013-03-18 ENCOUNTER — Other Ambulatory Visit: Payer: Self-pay | Admitting: Oncology

## 2013-03-18 DIAGNOSIS — C50312 Malignant neoplasm of lower-inner quadrant of left female breast: Secondary | ICD-10-CM

## 2013-03-19 HISTORY — PX: PORT-A-CATH REMOVAL: SHX5289

## 2013-03-20 ENCOUNTER — Ambulatory Visit (HOSPITAL_BASED_OUTPATIENT_CLINIC_OR_DEPARTMENT_OTHER): Payer: BC Managed Care – PPO

## 2013-03-20 ENCOUNTER — Other Ambulatory Visit: Payer: Self-pay | Admitting: Emergency Medicine

## 2013-03-20 ENCOUNTER — Ambulatory Visit (HOSPITAL_BASED_OUTPATIENT_CLINIC_OR_DEPARTMENT_OTHER): Payer: BC Managed Care – PPO | Admitting: Adult Health

## 2013-03-20 ENCOUNTER — Encounter: Payer: Self-pay | Admitting: Adult Health

## 2013-03-20 ENCOUNTER — Other Ambulatory Visit (HOSPITAL_BASED_OUTPATIENT_CLINIC_OR_DEPARTMENT_OTHER): Payer: BC Managed Care – PPO

## 2013-03-20 VITALS — BP 125/80 | HR 101 | Temp 97.3°F | Resp 18 | Ht 69.0 in | Wt 247.2 lb

## 2013-03-20 DIAGNOSIS — C50319 Malignant neoplasm of lower-inner quadrant of unspecified female breast: Secondary | ICD-10-CM

## 2013-03-20 DIAGNOSIS — C50312 Malignant neoplasm of lower-inner quadrant of left female breast: Secondary | ICD-10-CM

## 2013-03-20 DIAGNOSIS — I82409 Acute embolism and thrombosis of unspecified deep veins of unspecified lower extremity: Secondary | ICD-10-CM

## 2013-03-20 DIAGNOSIS — Z5111 Encounter for antineoplastic chemotherapy: Secondary | ICD-10-CM

## 2013-03-20 DIAGNOSIS — Z170 Estrogen receptor positive status [ER+]: Secondary | ICD-10-CM

## 2013-03-20 DIAGNOSIS — I82401 Acute embolism and thrombosis of unspecified deep veins of right lower extremity: Secondary | ICD-10-CM

## 2013-03-20 DIAGNOSIS — Z7901 Long term (current) use of anticoagulants: Secondary | ICD-10-CM

## 2013-03-20 LAB — CBC WITH DIFFERENTIAL/PLATELET
BASO%: 0.6 % (ref 0.0–2.0)
Basophils Absolute: 0.1 10*3/uL (ref 0.0–0.1)
EOS ABS: 0 10*3/uL (ref 0.0–0.5)
EOS%: 0.1 % (ref 0.0–7.0)
HEMATOCRIT: 25.9 % — AB (ref 34.8–46.6)
HGB: 8.7 g/dL — ABNORMAL LOW (ref 11.6–15.9)
LYMPH%: 16.5 % (ref 14.0–49.7)
MCH: 31.3 pg (ref 25.1–34.0)
MCHC: 33.6 g/dL (ref 31.5–36.0)
MCV: 93.2 fL (ref 79.5–101.0)
MONO#: 0.7 10*3/uL (ref 0.1–0.9)
MONO%: 6.7 % (ref 0.0–14.0)
NEUT%: 76.1 % (ref 38.4–76.8)
NEUTROS ABS: 7.5 10*3/uL — AB (ref 1.5–6.5)
Platelets: 126 10*3/uL — ABNORMAL LOW (ref 145–400)
RBC: 2.78 10*6/uL — ABNORMAL LOW (ref 3.70–5.45)
RDW: 21.4 % — ABNORMAL HIGH (ref 11.2–14.5)
WBC: 9.8 10*3/uL (ref 3.9–10.3)
lymph#: 1.6 10*3/uL (ref 0.9–3.3)
nRBC: 1 % — ABNORMAL HIGH (ref 0–0)

## 2013-03-20 LAB — COMPREHENSIVE METABOLIC PANEL (CC13)
ALBUMIN: 3.2 g/dL — AB (ref 3.5–5.0)
ALK PHOS: 72 U/L (ref 40–150)
ALT: 25 U/L (ref 0–55)
ANION GAP: 10 meq/L (ref 3–11)
AST: 16 U/L (ref 5–34)
BUN: 11.1 mg/dL (ref 7.0–26.0)
CALCIUM: 8.9 mg/dL (ref 8.4–10.4)
CHLORIDE: 104 meq/L (ref 98–109)
CO2: 24 mEq/L (ref 22–29)
Creatinine: 0.7 mg/dL (ref 0.6–1.1)
GLUCOSE: 122 mg/dL (ref 70–140)
Potassium: 4.1 mEq/L (ref 3.5–5.1)
Sodium: 138 mEq/L (ref 136–145)
TOTAL PROTEIN: 5.8 g/dL — AB (ref 6.4–8.3)
Total Bilirubin: 1.08 mg/dL (ref 0.20–1.20)

## 2013-03-20 MED ORDER — PALONOSETRON HCL INJECTION 0.25 MG/5ML
0.2500 mg | Freq: Once | INTRAVENOUS | Status: AC
  Administered 2013-03-20: 0.25 mg via INTRAVENOUS

## 2013-03-20 MED ORDER — PALONOSETRON HCL INJECTION 0.25 MG/5ML
INTRAVENOUS | Status: AC
  Filled 2013-03-20: qty 5

## 2013-03-20 MED ORDER — HEPARIN SOD (PORK) LOCK FLUSH 100 UNIT/ML IV SOLN
500.0000 [IU] | Freq: Once | INTRAVENOUS | Status: AC | PRN
  Administered 2013-03-20: 500 [IU]
  Filled 2013-03-20: qty 5

## 2013-03-20 MED ORDER — LORAZEPAM 0.5 MG PO TABS: 0.5000 mg | ORAL_TABLET | Freq: Four times a day (QID) | ORAL | Status: DC | PRN

## 2013-03-20 MED ORDER — SODIUM CHLORIDE 0.9 % IJ SOLN
10.0000 mL | INTRAMUSCULAR | Status: DC | PRN
  Administered 2013-03-20: 10 mL
  Filled 2013-03-20: qty 10

## 2013-03-20 MED ORDER — DEXAMETHASONE SODIUM PHOSPHATE 20 MG/5ML IJ SOLN
INTRAMUSCULAR | Status: AC
  Filled 2013-03-20: qty 5

## 2013-03-20 MED ORDER — DEXAMETHASONE 4 MG PO TABS: ORAL_TABLET | ORAL | Status: DC

## 2013-03-20 MED ORDER — DEXAMETHASONE SODIUM PHOSPHATE 20 MG/5ML IJ SOLN
12.0000 mg | Freq: Once | INTRAMUSCULAR | Status: AC
  Administered 2013-03-20: 12 mg via INTRAVENOUS

## 2013-03-20 MED ORDER — PROCHLORPERAZINE MALEATE 10 MG PO TABS: 10.0000 mg | ORAL_TABLET | Freq: Four times a day (QID) | ORAL | Status: DC | PRN

## 2013-03-20 MED ORDER — SODIUM CHLORIDE 0.9 % IV SOLN
Freq: Once | INTRAVENOUS | Status: AC
  Administered 2013-03-20: 13:00:00 via INTRAVENOUS

## 2013-03-20 MED ORDER — CYCLOPHOSPHAMIDE CHEMO INJECTION 1 GM
500.0000 mg/m2 | Freq: Once | INTRAMUSCULAR | Status: AC
  Administered 2013-03-20: 1200 mg via INTRAVENOUS
  Filled 2013-03-20: qty 60

## 2013-03-20 MED ORDER — SODIUM CHLORIDE 0.9 % IV SOLN
150.0000 mg | Freq: Once | INTRAVENOUS | Status: AC
  Administered 2013-03-20: 150 mg via INTRAVENOUS
  Filled 2013-03-20: qty 5

## 2013-03-20 MED ORDER — EPIRUBICIN HCL CHEMO IV INJECTION 200 MG/100ML
100.0000 mg/m2 | Freq: Once | INTRAVENOUS | Status: AC
  Administered 2013-03-20: 240 mg via INTRAVENOUS
  Filled 2013-03-20: qty 120

## 2013-03-20 MED ORDER — FLUOROURACIL CHEMO INJECTION 2.5 GM/50ML
500.0000 mg/m2 | Freq: Once | INTRAVENOUS | Status: AC
  Administered 2013-03-20: 1200 mg via INTRAVENOUS
  Filled 2013-03-20: qty 24

## 2013-03-20 NOTE — Patient Instructions (Signed)
[facility] Discharge Instructions for Patients Receiving Chemotherapy  Today you received the following chemotherapy agents: Epirubicin, 5FU, Cytoxan  To help prevent nausea and vomiting after your treatment, we encourage you to take your nausea medication as prescribed.   If you develop nausea and vomiting that is not controlled by your nausea medication, call the clinic.   BELOW ARE SYMPTOMS THAT SHOULD BE REPORTED IMMEDIATELY:  *FEVER GREATER THAN 100.5 F  *CHILLS WITH OR WITHOUT FEVER  NAUSEA AND VOMITING THAT IS NOT CONTROLLED WITH YOUR NAUSEA MEDICATION  *UNUSUAL SHORTNESS OF BREATH  *UNUSUAL BRUISING OR BLEEDING  TENDERNESS IN MOUTH AND THROAT WITH OR WITHOUT PRESENCE OF ULCERS  *URINARY PROBLEMS  *BOWEL PROBLEMS  UNUSUAL RASH Items with * indicate a potential emergency and should be followed up as soon as possible.  Feel free to call the clinic you have any questions or concerns. The clinic phone number is [phone].    

## 2013-03-20 NOTE — Progress Notes (Signed)
[place]  [phone] Fax:(336) [phone]  OFFICE PROGRESS NOTE  ID: [name]   DOB: [birth year]  MR#: [id number]  [facility]#:[id number]   PCP: Lanetta Inch L. Sherre Lain SU:  Jovita Kussmaul, MD  RAD ONC:  Arloa Koh, MD   DIAGNOSIS: [name] is a 38 y.o. 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  She was seen in the multidisciplinary breast clinic on 10/23/2012 for discussion of treatment options.    STAGE:  Cancer of lower-inner quadrant of female breast  Primary site: Breast (Left)  Staging method: AJCC 7th Edition  Clinical: Stage IA (T1c, N0, cM0)  Summary: Stage IA (T1c, N0, cM0)   PRIOR THERAPY: #1  A screening mammogram in [place], [place] on 09/23/2012 she was felt to have a new mass within the left breast. An ultrasound on 10/06/2012 showed a 1.6 x 1.3 x 1.3 cm mass at the 8 o'clock position 7 cm from the nipple. At the 12 o'clock position behind and nipple was a circumscribed oval shaped 2 x 1.8 x 1.2 cm lesion perhaps representing a fibroadenoma. 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 The carcinoma was grade 2-3, estrogen receptor 80% positive, progesterone receptor 80% positive, Ki-67 25%, and HER-2/neu negative. Bilateral breast MRI on 10/22/2012 showed biopsy proven carcinoma in the left lower inner quadrant identified and measures approximately 1.9 x 1.9 x 1.8 cm.  Well-defined nodule in the right upper outer quadrant posteriorly consistent with a fibroadenoma. Probable liver cysts.There were multiple scattered nonspecific foci in each breast. No other suspicious abnormality was identified. No abnormal appearing lymph nodes (Clinical stage I, T1 N0).   #2 Status post left breast lumpectomy with left axillary sentinel lymph node biopsy on 12/10/2012 for a stage IIB, pT2,  pN2mi, MX, 2.3 cm invasive ductal carcinoma, grade 3, estrogen receptor 80% positive, progesterone receptor 80% positive, Ki-67 25%, HER-2/neu negative, with 1/2 left axillary sentinel lymph nodes positive for microscopic disease.   #3 Genetic testing performed in [place], [place] with self reported by the patient as BRCA1 and BRCA2 negative.  #4 Adjuvant chemotherapy consisting of FEC (5-FU/epirubicin/Cytoxan) began on 01/23/2013. Six cycles of FEC is planned. FEC will then be followed by weekly adjuvant chemotherapy consisting of Taxol for 12 weeks.  #5  Right lower extremity DVT diagnosed on 03/16/2013, patient was placed on Xarelto $RemoveBe'15mg'BAvpWyjNS$  BID.    CURRENT THERAPY:  FEC cycle 5 day 1  INTERVAL HISTORY: [name] is a  38 y.o. female who returns today for evaluation prior to chemotherapy today.  She is doing well.  Her right leg is doing well.  She is taking Xarelto BID and tolerating it well.  She denies easy bruising/easy bleeding.  She denies any fevers, chills, nausea, vomiting, constipation, diarrhea, numbness, skin changes or any further concerns.   MEDICAL HISTORY: Past Medical History  Diagnosis Date  . Urinary incontinence     occasional  . Family history of anesthesia complication     pt's mother has hx. of post-op N/V  . Hypertension     under control with med., has been on med. since 09/2012  . Anxiety   . Breast cancer     left    ALLERGIES:   Allergies  Allergen Reactions  . Amoxicillin Other (See Comments)    States "it does not work"    MEDICATIONS:  Current  Outpatient Prescriptions  Medication Sig Dispense Refill  . ciprofloxacin (CIPRO) 500 MG tablet Take 1 tablet (500 mg total) by mouth 2 (two) times daily.  14 tablet  2  . dexamethasone (DECADRON) 4 MG tablet Take 2 tablets by mouth once a day on the day after chemotherapy and then take 2 tablets two times a day for 2 days. Take with food.  30 tablet  1  . HYDROcodone-acetaminophen (NORCO/VICODIN) 5-325  MG per tablet Take 1-2 tablets by mouth every 4 (four) hours as needed.  60 tablet  0  . lidocaine-prilocaine (EMLA) cream Apply topically as needed.  30 g  0  . lisinopril (PRINIVIL,ZESTRIL) 10 MG tablet Take 10 mg by mouth daily.      Marland Kitchen omeprazole (PRILOSEC) 20 MG capsule TAKE ONE CAPSULE BY MOUTH EVERY DAY  30 capsule  2  . prochlorperazine (COMPAZINE) 10 MG tablet Take 1 tablet (10 mg total) by mouth every 6 (six) hours as needed (Nausea or vomiting).  30 tablet  1  . Rivaroxaban (XARELTO) 15 MG TABS tablet Take 1 tablet (15 mg total) by mouth 2 (two) times daily with a meal.  42 tablet  0  . ALPRAZolam (XANAX) 0.25 MG tablet Take 1 tablet (0.25 mg total) by mouth at bedtime as needed for sleep.  30 tablet  2  . LORazepam (ATIVAN) 0.5 MG tablet Take 1 tablet (0.5 mg total) by mouth every 6 (six) hours as needed (Nausea or vomiting).  30 tablet  0  . ondansetron (ZOFRAN) 8 MG tablet Take 1 tablet (8 mg total) by mouth 2 (two) times daily as needed. Take two times a day as needed for nausea or vomiting starting on the third day after chemotherapy.  30 tablet  1   No current facility-administered medications for this visit.   Facility-Administered Medications Ordered in Other Visits  Medication Dose Route Frequency Provider Last Rate Last Dose  . cyclophosphamide (CYTOXAN) 1,200 mg in sodium chloride 0.9 % 250 mL chemo infusion  500 mg/m2 (Order-Specific) Intravenous Once Illa Level, NP      . epirubicin (ELLENCE) chemo injection 240 mg  100 mg/m2 (Order-Specific) Intravenous Once Illa Level, NP      . fluorouracil (ADRUCIL) chemo injection 1,200 mg  500 mg/m2 (Treatment Plan Actual) Intravenous Once Illa Level, NP      . heparin lock flush 100 unit/mL  500 Units Intracatheter Once PRN Illa Level, NP      . sodium chloride 0.9 % injection 10 mL  10 mL Intracatheter PRN Illa Level, NP        SURGICAL HISTORY:  Past Surgical History  Procedure Laterality  Date  . Cesarean section  1999  . Breast lumpectomy with needle localization and axillary sentinel lymph node bx Left 12/10/2012    Procedure: BREAST LUMPECTOMY WITH NEEDLE LOCALIZATION AND AXILLARY SENTINEL LYMPH NODE BX;  Surgeon: Robyne Askew, MD;  Location: [facility];  Service: General;  Laterality: Left;  Needle loc BCG 7:30  nuc med 9:30    . Tonsillectomy and adenoidectomy      as a teenager  . Cholecystectomy  1999  . Open reduction internal fixation (orif) tibia/fibula fracture Right   . Portacath placement Right 01/09/2013    Procedure: INSERTION PORT-A-CATH;  Surgeon: Robyne Askew, MD;  Location: [facility];  Service: General;  Laterality: Right;    REVIEW OF SYSTEMS:  A 10 point review of systems was completed  and is negative except as noted above.    PHYSICAL EXAMINATION: Blood pressure 125/80, pulse 101, temperature 97.3 F (36.3 C), temperature source Oral, resp. rate 18, height $RemoveBe'5\' 9"'PAAgpthIo$  (1.753 m), weight 247 lb 3 oz (112.124 kg). Body mass index is 36.49 kg/(m^2). GENERAL: Patient is a well appearing female in no acute distress HEENT:  Sclerae anicteric.  Oropharynx clear and moist. No ulcerations or evidence of oropharyngeal candidiasis. Neck is supple.  NODES:  No cervical, supraclavicular, or axillary lymphadenopathy palpated.  BREAST EXAM:  Deferred. LUNGS:  Clear to auscultation bilaterally.  No wheezes or rhonchi. HEART:  Regular rate and rhythm. No murmur appreciated. ABDOMEN:  Soft, nontender.  Positive, normoactive bowel sounds. No organomegaly palpated. MSK:  No focal spinal tenderness to palpation. Full range of motion bilaterally in the upper extremities. EXTREMITIES:  No peripheral edema.   SKIN:  Clear with no obvious rashes or skin changes. No nail dyscrasia. NEURO:  Nonfocal. Well oriented.  Appropriate affect. ECOG PERFORMANCE STATUS: 0 - Asymptomatic   LABORATORY DATA: Lab Results  Component Value Date   WBC 9.8 03/20/2013   HGB 8.7*  03/20/2013   HCT 25.9* 03/20/2013   MCV 93.2 03/20/2013   PLT 126* 03/20/2013      Chemistry      Component Value Date/Time   NA 139 03/13/2013 1053   NA 136 01/23/2013 1413   K 3.9 03/13/2013 1053   K 3.7 01/23/2013 1413   CL 102 01/23/2013 1413   CO2 26 03/13/2013 1053   CO2 27 01/23/2013 1413   BUN 24.4 03/13/2013 1053   BUN 12 01/23/2013 1413   CREATININE 0.7 03/13/2013 1053   CREATININE 0.68 01/23/2013 1413      Component Value Date/Time   CALCIUM 8.7 03/13/2013 1053   CALCIUM 9.3 01/23/2013 1413   ALKPHOS 90 03/13/2013 1053   ALKPHOS 74 01/23/2013 1413   AST 9 03/13/2013 1053   AST 27 01/23/2013 1413   ALT 22 03/13/2013 1053   ALT 30 01/23/2013 1413   BILITOT 0.94 03/13/2013 1053   BILITOT 0.5 01/23/2013 1413      RADIOGRAPHIC STUDIES: No results found.   ASSESSMENT:   [name] is a 38 y.o. [place], [place] female with:  #1 Stage II, invasive ductal carcinoma of the left breast, grade 3, estrogen receptor/progesterone receptor positive, Ki-67 25%, HER-2/neu negative. Status post left breast lumpectomy with left axillary sentinel lymph node biopsy on 12/10/2012 with 1 of 2 sentinel nodes revealing microscopic disease. The patient elected not to receive axillary lymph node dissection and will undergo radiation therapy after the completion of chemotherapy.    #2 Started adjuvant chemotherapy on 01/23/2013 consisting of FEC (5-FU/Epirubicin/Cytoxan) with Neulasta support to be given every 2 weeks for a total of 6 cycles.  This will be followed by adjuvant chemotherapy consisting of Taxol on a weekly basis for 12 weeks. Port-A-Cath placement on 01/09/2013, 2D echocardiogram with LVEF of 50% - 55%  and chemotherapy class on 01/12/2013. She underwent a repeat echocardiogram on 03/05/13 that demonstrated an improvement in her LVEF to 55-60%, she was evaluated by Dr. Haroldine Laws on 03/09/13 who cleared her to continue with Epirubicin therapy, and will f/u with her in 3 months.    #3 Right  lower extremity DVT on 03/16/13, on Xarelto.  A hypercoagulable panel is pending.   PLAN:  #1  Ms. Bissette is doing well today.  Her CBC and CMET are pending.  Once we get results from those, she will proceed with  chemotherapy.    #2 She will continue the Xarelto for her right lower extremity DVT.  She is tolerating it well.  #3  She will return in one week for labs and evaluation for chemotoxcities.    All questions answered. Ms. Macy was encouraged to contact us in the interim with any questions, concerns, or problems  I spent 15 minutes counseling the patient face to face.  The total time spent in the appointment was 30 minutes.  Minette Headland, [place] [phone] 03/20/2013, 1:31 PM

## 2013-03-21 ENCOUNTER — Ambulatory Visit (HOSPITAL_BASED_OUTPATIENT_CLINIC_OR_DEPARTMENT_OTHER): Payer: BC Managed Care – PPO

## 2013-03-21 VITALS — BP 135/67 | HR 84 | Temp 97.6°F | Resp 20

## 2013-03-21 DIAGNOSIS — Z5189 Encounter for other specified aftercare: Secondary | ICD-10-CM

## 2013-03-21 DIAGNOSIS — C50312 Malignant neoplasm of lower-inner quadrant of left female breast: Secondary | ICD-10-CM

## 2013-03-21 DIAGNOSIS — C50319 Malignant neoplasm of lower-inner quadrant of unspecified female breast: Secondary | ICD-10-CM

## 2013-03-21 MED ORDER — PEGFILGRASTIM INJECTION 6 MG/0.6ML
6.0000 mg | Freq: Once | SUBCUTANEOUS | Status: AC
  Administered 2013-03-21: 6 mg via SUBCUTANEOUS

## 2013-03-21 NOTE — Patient Instructions (Signed)

## 2013-03-25 LAB — HYPERCOAGULABLE PANEL, COMPREHENSIVE
ANTICARDIOLIPIN IGA: 11 U/mL (ref <22)
ANTICARDIOLIPIN IGM: 3 [MPL'U]/mL (ref <11)
ANTITHROMB III FUNC: 114 % (ref 76–126)
Anticardiolipin IgG: 7 GPL U/mL (ref <23)
BETA 2 GLYCO I IGG: 0 G Units (ref <20)
BETA-2-GLYCOPROTEIN I IGA: 7 A Units (ref <20)
BETA-2-GLYCOPROTEIN I IGM: 3 M Units (ref <20)
DRVVT 1:1 Mix: 45.1 secs — ABNORMAL HIGH (ref <42.9)
DRVVT: 56.9 secs — ABNORMAL HIGH (ref <42.9)
Drvvt confirmation: 0.77 Ratio (ref <1.11)
Lupus Anticoagulant: NOT DETECTED
PTT LA: 46.6 s — AB (ref 28.0–43.0)
PTTLA 41 MIX: 41.3 s (ref 28.0–43.0)
Protein C Activity: 148 % — ABNORMAL HIGH (ref 75–133)
Protein C, Total: 76 % (ref 72–160)
Protein S Activity: 151 % — ABNORMAL HIGH (ref 69–129)
Protein S Total: 97 % (ref 60–150)

## 2013-03-27 ENCOUNTER — Encounter: Payer: Self-pay | Admitting: Adult Health

## 2013-03-27 ENCOUNTER — Ambulatory Visit (HOSPITAL_COMMUNITY)
Admission: RE | Admit: 2013-03-27 | Discharge: 2013-03-27 | Disposition: A | Discharge status: Home / Self Care | Payer: BC Managed Care – PPO | Source: Ambulatory Visit | Attending: Oncology | Admitting: Oncology

## 2013-03-27 ENCOUNTER — Ambulatory Visit (HOSPITAL_BASED_OUTPATIENT_CLINIC_OR_DEPARTMENT_OTHER): Payer: BC Managed Care – PPO | Admitting: Adult Health

## 2013-03-27 ENCOUNTER — Ambulatory Visit (HOSPITAL_BASED_OUTPATIENT_CLINIC_OR_DEPARTMENT_OTHER): Payer: BC Managed Care – PPO

## 2013-03-27 ENCOUNTER — Ambulatory Visit: Payer: BC Managed Care – PPO

## 2013-03-27 ENCOUNTER — Other Ambulatory Visit (HOSPITAL_BASED_OUTPATIENT_CLINIC_OR_DEPARTMENT_OTHER): Payer: BC Managed Care – PPO

## 2013-03-27 VITALS — BP 106/76 | HR 83 | Temp 98.6°F | Resp 18

## 2013-03-27 VITALS — BP 116/77 | HR 106 | Temp 99.3°F | Resp 18 | Ht 69.0 in | Wt 248.5 lb

## 2013-03-27 DIAGNOSIS — C50319 Malignant neoplasm of lower-inner quadrant of unspecified female breast: Secondary | ICD-10-CM | POA: Insufficient documentation

## 2013-03-27 DIAGNOSIS — Z86718 Personal history of other venous thrombosis and embolism: Secondary | ICD-10-CM

## 2013-03-27 DIAGNOSIS — D649 Anemia, unspecified: Secondary | ICD-10-CM

## 2013-03-27 DIAGNOSIS — C50312 Malignant neoplasm of lower-inner quadrant of left female breast: Secondary | ICD-10-CM

## 2013-03-27 DIAGNOSIS — D702 Other drug-induced agranulocytosis: Secondary | ICD-10-CM

## 2013-03-27 DIAGNOSIS — D6481 Anemia due to antineoplastic chemotherapy: Secondary | ICD-10-CM

## 2013-03-27 DIAGNOSIS — Z170 Estrogen receptor positive status [ER+]: Secondary | ICD-10-CM

## 2013-03-27 DIAGNOSIS — T451X5A Adverse effect of antineoplastic and immunosuppressive drugs, initial encounter: Secondary | ICD-10-CM

## 2013-03-27 LAB — CBC WITH DIFFERENTIAL/PLATELET
BASO%: 0 % (ref 0.0–2.0)
Basophils Absolute: 0 10*3/uL (ref 0.0–0.1)
EOS%: 0 % (ref 0.0–7.0)
Eosinophils Absolute: 0 10*3/uL (ref 0.0–0.5)
HCT: 23.2 % — ABNORMAL LOW (ref 34.8–46.6)
HGB: 7.9 g/dL — ABNORMAL LOW (ref 11.6–15.9)
LYMPH#: 0.7 10*3/uL — AB (ref 0.9–3.3)
LYMPH%: 85.7 % — AB (ref 14.0–49.7)
MCH: 31.3 pg (ref 25.1–34.0)
MCHC: 34.1 g/dL (ref 31.5–36.0)
MCV: 92.1 fL (ref 79.5–101.0)
MONO#: 0.1 10*3/uL (ref 0.1–0.9)
MONO%: 6.5 % (ref 0.0–14.0)
NEUT%: 7.8 % — ABNORMAL LOW (ref 38.4–76.8)
NEUTROS ABS: 0.1 10*3/uL — AB (ref 1.5–6.5)
PLATELETS: 146 10*3/uL (ref 145–400)
RBC: 2.52 10*6/uL — AB (ref 3.70–5.45)
RDW: 19.1 % — ABNORMAL HIGH (ref 11.2–14.5)
WBC: 0.8 10*3/uL — AB (ref 3.9–10.3)
nRBC: 0 % (ref 0–0)

## 2013-03-27 LAB — COMPREHENSIVE METABOLIC PANEL (CC13)
ALT: 17 U/L (ref 0–55)
AST: 9 U/L (ref 5–34)
Albumin: 3.4 g/dL — ABNORMAL LOW (ref 3.5–5.0)
Alkaline Phosphatase: 82 U/L (ref 40–150)
Anion Gap: 9 mEq/L (ref 3–11)
BUN: 15.8 mg/dL (ref 7.0–26.0)
CALCIUM: 9 mg/dL (ref 8.4–10.4)
CO2: 27 mEq/L (ref 22–29)
CREATININE: 0.7 mg/dL (ref 0.6–1.1)
Chloride: 102 mEq/L (ref 98–109)
Glucose: 98 mg/dl (ref 70–140)
Potassium: 3.7 mEq/L (ref 3.5–5.1)
Sodium: 137 mEq/L (ref 136–145)
TOTAL PROTEIN: 5.9 g/dL — AB (ref 6.4–8.3)
Total Bilirubin: 1.71 mg/dL — ABNORMAL HIGH (ref 0.20–1.20)

## 2013-03-27 LAB — PREPARE RBC (CROSSMATCH)

## 2013-03-27 LAB — ABO/RH: ABO/RH(D): A POS

## 2013-03-27 MED ORDER — DIPHENHYDRAMINE HCL 25 MG PO CAPS
25.0000 mg | ORAL_CAPSULE | Freq: Once | ORAL | Status: AC
  Administered 2013-03-27: 25 mg via ORAL

## 2013-03-27 MED ORDER — ACETAMINOPHEN 325 MG PO TABS
650.0000 mg | ORAL_TABLET | Freq: Once | ORAL | Status: AC
  Administered 2013-03-27: 650 mg via ORAL

## 2013-03-27 MED ORDER — SODIUM CHLORIDE 0.9 % IJ SOLN
10.0000 mL | INTRAMUSCULAR | Status: AC | PRN
  Administered 2013-03-27: 10 mL
  Filled 2013-03-27: qty 10

## 2013-03-27 MED ORDER — DIPHENHYDRAMINE HCL 25 MG PO CAPS
ORAL_CAPSULE | ORAL | Status: AC
  Filled 2013-03-27: qty 1

## 2013-03-27 MED ORDER — ACETAMINOPHEN 325 MG PO TABS
ORAL_TABLET | ORAL | Status: AC
  Filled 2013-03-27: qty 2

## 2013-03-27 MED ORDER — SODIUM CHLORIDE 0.9 % IV SOLN
250.0000 mL | Freq: Once | INTRAVENOUS | Status: AC
  Administered 2013-03-27: 250 mL via INTRAVENOUS

## 2013-03-27 MED ORDER — HEPARIN SOD (PORK) LOCK FLUSH 100 UNIT/ML IV SOLN
500.0000 [IU] | Freq: Every day | INTRAVENOUS | Status: AC | PRN
  Administered 2013-03-27: 500 [IU]
  Filled 2013-03-27: qty 5

## 2013-03-27 NOTE — Progress Notes (Addendum)
[place]  [phone]) [phone] Fax:(336) [phone]  OFFICE PROGRESS NOTE  ID: [name]   DOB: 1975/11/07  MR#: [id number]  NID#:[id number]   PCP: Stacie L. Sherre Lain SU:  Jovita Kussmaul, MD  RAD ONC:  Arloa Koh, MD   DIAGNOSIS: [name] is a 38 y.o. 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  She was seen in the multidisciplinary breast clinic on 10/23/2012 for discussion of treatment options.    STAGE:  Cancer of lower-inner quadrant of female breast  Primary site: Breast (Left)  Staging method: AJCC 7th Edition  Clinical: Stage IA (T1c, N0, cM0)  Summary: Stage IA (T1c, N0, cM0)   PRIOR THERAPY: #1  A screening mammogram in [place], [place] on 09/23/2012 she was felt to have a new mass within the left breast. An ultrasound on 10/06/2012 showed a 1.6 x 1.3 x 1.3 cm mass at the 8 o'clock position 7 cm from the nipple. At the 12 o'clock position behind and nipple was a circumscribed oval shaped 2 x 1.8 x 1.2 cm lesion perhaps representing a fibroadenoma. 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 The carcinoma was grade 2-3, estrogen receptor 80% positive, progesterone receptor 80% positive, Ki-67 25%, and HER-2/neu negative. Bilateral breast MRI on 10/22/2012 showed biopsy proven carcinoma in the left lower inner quadrant identified and measures approximately 1.9 x 1.9 x 1.8 cm.  Well-defined nodule in the right upper outer quadrant posteriorly consistent with a fibroadenoma. Probable liver cysts.There were multiple scattered nonspecific foci in each breast. No other suspicious abnormality was identified. No abnormal appearing lymph nodes (Clinical stage I, T1 N0).   #2 Status post left breast lumpectomy with left axillary sentinel lymph node biopsy on 12/10/2012 for a stage IIB, pT2,  pN79mi, MX, 2.3 cm invasive ductal carcinoma, grade 3, estrogen receptor 80% positive, progesterone receptor 80% positive, Ki-67 25%, HER-2/neu negative, with 1/2 left axillary sentinel lymph nodes positive for microscopic disease.   #3 Genetic testing performed in [place], [place] with self reported by the patient as BRCA1 and BRCA2 negative.  #4 Adjuvant chemotherapy consisting of FEC (5-FU/epirubicin/Cytoxan) began on 01/23/2013. Six cycles of FEC is planned. FEC will then be followed by weekly adjuvant chemotherapy consisting of Taxol for 12 weeks.  #5  Right lower extremity DVT diagnosed on 03/16/2013, patient was placed on Xarelto $RemoveBe'15mg'lyOpMFJzD$  BID.    CURRENT THERAPY:  FEC cycle 5 day 8  INTERVAL HISTORY: [name] is a  38 y.o. female who returns today for evaluation following her fifth cycle of FEC.  She does not feel well today.  She is weak and tired.  She is doing her best to drink fluids.  She has some mild back pain.  She is taking the Xarelto BID and tolerating it well.  She denies easy bruising, bleeding, particularly blood in her stool or black/tarry stools.  She denies fevers, chills, nausea, vomiting, constipation, diarrhea, numbness.     MEDICAL HISTORY: Past Medical History  Diagnosis Date  . Urinary incontinence     occasional  . Family history of anesthesia complication     pt's mother has hx. of post-op N/V  . Hypertension     under control with med., has been on med. since 09/2012  . Anxiety   . Breast cancer     left    ALLERGIES:   Allergies  Allergen Reactions  . Amoxicillin Other (See Comments)    States "it does not work"    MEDICATIONS:  Current Outpatient Prescriptions  Medication Sig Dispense Refill  . ALPRAZolam (XANAX) 0.25 MG tablet Take 1 tablet (0.25 mg total) by mouth at bedtime as needed for sleep.  30 tablet  2  . HYDROcodone-acetaminophen (NORCO/VICODIN) 5-325 MG per tablet Take 1-2 tablets by mouth every 4 (four) hours as needed.  60 tablet   0  . lidocaine-prilocaine (EMLA) cream Apply topically as needed.  30 g  0  . lisinopril (PRINIVIL,ZESTRIL) 10 MG tablet Take 10 mg by mouth daily.      Marland Kitchen omeprazole (PRILOSEC) 20 MG capsule TAKE ONE CAPSULE BY MOUTH EVERY DAY  30 capsule  2  . Rivaroxaban (XARELTO) 15 MG TABS tablet Take 1 tablet (15 mg total) by mouth 2 (two) times daily with a meal.  42 tablet  0  . dexamethasone (DECADRON) 4 MG tablet Take 2 tablets by mouth once a day on the day after chemotherapy and then take 2 tablets two times a day for 2 days. Take with food.  30 tablet  1  . LORazepam (ATIVAN) 0.5 MG tablet Take 1 tablet (0.5 mg total) by mouth every 6 (six) hours as needed (Nausea or vomiting).  30 tablet  0  . ondansetron (ZOFRAN) 8 MG tablet Take 1 tablet (8 mg total) by mouth 2 (two) times daily as needed. Take two times a day as needed for nausea or vomiting starting on the third day after chemotherapy.  30 tablet  1  . prochlorperazine (COMPAZINE) 10 MG tablet Take 1 tablet (10 mg total) by mouth every 6 (six) hours as needed (Nausea or vomiting).  30 tablet  1   No current facility-administered medications for this visit.    SURGICAL HISTORY:  Past Surgical History  Procedure Laterality Date  . Cesarean section  1999  . Breast lumpectomy with needle localization and axillary sentinel lymph node bx Left 12/10/2012    Procedure: BREAST LUMPECTOMY WITH NEEDLE LOCALIZATION AND AXILLARY SENTINEL LYMPH NODE BX;  Surgeon: Merrie Roof, MD;  Location: [facility];  Service: General;  Laterality: Left;  Needle loc BCG 7:30  nuc med 9:30    . Tonsillectomy and adenoidectomy      as a teenager  . Cholecystectomy  1999  . Open reduction internal fixation (orif) tibia/fibula fracture Right   . Portacath placement Right 01/09/2013    Procedure: INSERTION PORT-A-CATH;  Surgeon: Merrie Roof, MD;  Location: [facility];  Service: General;  Laterality: Right;    REVIEW OF SYSTEMS:  A 10 point review of  systems was completed and is negative except as noted above.    PHYSICAL EXAMINATION: Blood pressure 116/77, pulse 106, temperature 99.3 F (37.4 C), temperature source Oral, resp. rate 18, height $RemoveBe'5\' 9"'Prgqivygu$  (1.753 m), weight 248 lb 8 oz (112.719 kg). Body mass index is 36.68 kg/(m^2). GENERAL: Patient is a well appearing female in no acute distress HEENT:  Sclerae anicteric.  Oropharynx clear and moist. No ulcerations or evidence of oropharyngeal candidiasis. Neck is supple.  NODES:  No cervical, supraclavicular, or axillary lymphadenopathy palpated.  BREAST EXAM:  Deferred. LUNGS:  Clear to auscultation bilaterally.  No wheezes or rhonchi. HEART:  Regular rate and rhythm. No murmur appreciated. ABDOMEN:  Soft, nontender.  Positive, normoactive bowel sounds. No organomegaly palpated. MSK:  No focal spinal tenderness to palpation. Full range of motion bilaterally in the  upper extremities. EXTREMITIES:  No peripheral edema.   SKIN:  Clear with no obvious rashes or skin changes. No nail dyscrasia. NEURO:  Nonfocal. Well oriented.  Appropriate affect. ECOG PERFORMANCE STATUS: 0 - Asymptomatic   LABORATORY DATA: Lab Results  Component Value Date   WBC 0.8* 03/27/2013   HGB 7.9* 03/27/2013   HCT 23.2* 03/27/2013   MCV 92.1 03/27/2013   PLT 146 03/27/2013      Chemistry      Component Value Date/Time   NA 137 03/27/2013 1016   NA 136 01/23/2013 1413   K 3.7 03/27/2013 1016   K 3.7 01/23/2013 1413   CL 102 01/23/2013 1413   CO2 27 03/27/2013 1016   CO2 27 01/23/2013 1413   BUN 15.8 03/27/2013 1016   BUN 12 01/23/2013 1413   CREATININE 0.7 03/27/2013 1016   CREATININE 0.68 01/23/2013 1413      Component Value Date/Time   CALCIUM 9.0 03/27/2013 1016   CALCIUM 9.3 01/23/2013 1413   ALKPHOS 82 03/27/2013 1016   ALKPHOS 74 01/23/2013 1413   AST 9 03/27/2013 1016   AST 27 01/23/2013 1413   ALT 17 03/27/2013 1016   ALT 30 01/23/2013 1413   BILITOT 1.71* 03/27/2013 1016   BILITOT 0.5 01/23/2013 1413      RADIOGRAPHIC  STUDIES: No results found.   ASSESSMENT:   [name] is a 38 y.o. [place], [place] female with:  #1 Stage II, invasive ductal carcinoma of the left breast, grade 3, estrogen receptor/progesterone receptor positive, Ki-67 25%, HER-2/neu negative. Status post left breast lumpectomy with left axillary sentinel lymph node biopsy on 12/10/2012 with 1 of 2 sentinel nodes revealing microscopic disease. The patient elected not to receive axillary lymph node dissection and will undergo radiation therapy after the completion of chemotherapy.    #2 Started adjuvant chemotherapy on 01/23/2013 consisting of FEC (5-FU/Epirubicin/Cytoxan) with Neulasta support to be given every 2 weeks for a total of 6 cycles.  This will be followed by adjuvant chemotherapy consisting of Taxol on a weekly basis for 12 weeks. Port-A-Cath placement on 01/09/2013, 2D echocardiogram with LVEF of 50% - 55%  and chemotherapy class on 01/12/2013. She underwent a repeat echocardiogram on 03/05/13 that demonstrated an improvement in her LVEF to 55-60%, she was evaluated by Dr. Haroldine Laws on 03/09/13 who cleared her to continue with Epirubicin therapy, and will f/u with her in 3 months.    #3 Right lower extremity DVT on 03/16/13, on Xarelto.  A hypercoagulable panel is pending.   PLAN:  #1  Ms. Sweis does not feel well today.  Her hemoglobin is 7.9.  We discussed her receiving a blood transfusion.  She and I discussed the risks, benefits, and she would like to proceed.    #2  She is also neutropenic and will have her cipro refilled and start tonight.    #3  She will return next week for labs, evaluation, and cycle 6 of chemotherapy.    All questions answered. Ms. Valls was encouraged to contact us in the interim with any questions, concerns, or problems  I spent 25 minutes counseling the patient face to face.  The total time spent in the appointment was 30 minutes.  Minette Headland, [place] [phone] 03/28/2013, 9:29 AM  ATTENDING'S ATTESTATION:  I personally reviewed patient's chart, examined patient myself, formulated the treatment plan as followed.    Receiving adjuvant chemotherapy with FEC s/p cycle 5/6. Now anemic and neutropenic.  She is symptomatic from her anemia and we will transfuse 2 units of PRBC. She will als begin prophylactic antibiotics. We discussed neutropenic precautions.  She will be seen back in 1 week for follow up and cycle 6 of FEC.  Marcy Panning, MD Medical/Oncology [facility] [phone] (beeper) [phone] (Office)  04/11/2013, 2:44 PM

## 2013-03-27 NOTE — Patient Instructions (Signed)
Blood Transfusion Information WHAT IS A BLOOD TRANSFUSION? A transfusion is the replacement of blood or some of its parts. Blood is made up of multiple cells which provide different functions.  Red blood cells carry oxygen and are used for blood loss replacement.  White blood cells fight against infection.  Platelets control bleeding.  Plasma helps clot blood.  Other blood products are available for specialized needs, such as hemophilia or other clotting disorders. BEFORE THE TRANSFUSION  Who gives blood for transfusions?   You may be able to donate blood to be used at a later date on yourself (autologous donation).  Relatives can be asked to donate blood. This is generally not any safer than if you have received blood from a stranger. The same precautions are taken to ensure safety when a relative's blood is donated.  Healthy volunteers who are fully evaluated to make sure their blood is safe. This is blood bank blood. Transfusion therapy is the safest it has ever been in the practice of medicine. Before blood is taken from a donor, a complete history is taken to make sure that person has no history of diseases nor engages in risky social behavior (examples are intravenous drug use or sexual activity with multiple partners). The donor's travel history is screened to minimize risk of transmitting infections, such as malaria. The donated blood is tested for signs of infectious diseases, such as HIV and hepatitis. The blood is then tested to be sure it is compatible with you in order to minimize the chance of a transfusion reaction. If you or a relative donates blood, this is often done in anticipation of surgery and is not appropriate for emergency situations. It takes many days to process the donated blood. RISKS AND COMPLICATIONS Although transfusion therapy is very safe and saves many lives, the main dangers of transfusion include:   Getting an infectious disease.  Developing a  transfusion reaction. This is an allergic reaction to something in the blood you were given. Every precaution is taken to prevent this. The decision to have a blood transfusion has been considered carefully by your caregiver before blood is given. Blood is not given unless the benefits outweigh the risks. AFTER THE TRANSFUSION  Right after receiving a blood transfusion, you will usually feel much better and more energetic. This is especially true if your red blood cells have gotten low (anemic). The transfusion raises the level of the red blood cells which carry oxygen, and this usually causes an energy increase.  The nurse administering the transfusion will monitor you carefully for complications. HOME CARE INSTRUCTIONS  No special instructions are needed after a transfusion. You may find your energy is better. Speak with your caregiver about any limitations on activity for underlying diseases you may have. SEEK MEDICAL CARE IF:   Your condition is not improving after your transfusion.  You develop redness or irritation at the intravenous (IV) site. SEEK IMMEDIATE MEDICAL CARE IF:  Any of the following symptoms occur over the next 12 hours:  Shaking chills.  You have a temperature by mouth above 102 F (38.9 C), not controlled by medicine.  Chest, back, or muscle pain.  People around you feel you are not acting correctly or are confused.  Shortness of breath or difficulty breathing.  Dizziness and fainting.  You get a rash or develop hives.  You have a decrease in urine output.  Your urine turns a dark color or changes to pink, red, or brown. Any of the following   symptoms occur over the next 10 days:  You have a temperature by mouth above 102 F (38.9 C), not controlled by medicine.  Shortness of breath.  Weakness after normal activity.  The white part of the eye turns yellow (jaundice).  You have a decrease in the amount of urine or are urinating less often.  Your  urine turns a dark color or changes to pink, red, or brown. Document Released: 03/02/2000 Document Revised: 05/28/2011 Document Reviewed: 10/20/2007 ExitCare Patient Information 2014 [employer].  

## 2013-03-28 LAB — TYPE AND SCREEN
ABO/RH(D): A POS
Antibody Screen: NEGATIVE
UNIT DIVISION: 0
UNIT DIVISION: 0

## 2013-04-03 ENCOUNTER — Other Ambulatory Visit (HOSPITAL_BASED_OUTPATIENT_CLINIC_OR_DEPARTMENT_OTHER): Payer: BC Managed Care – PPO

## 2013-04-03 ENCOUNTER — Ambulatory Visit (HOSPITAL_BASED_OUTPATIENT_CLINIC_OR_DEPARTMENT_OTHER): Payer: BC Managed Care – PPO | Admitting: Adult Health

## 2013-04-03 ENCOUNTER — Other Ambulatory Visit: Payer: Self-pay | Admitting: Emergency Medicine

## 2013-04-03 ENCOUNTER — Ambulatory Visit (HOSPITAL_BASED_OUTPATIENT_CLINIC_OR_DEPARTMENT_OTHER): Payer: BC Managed Care – PPO

## 2013-04-03 ENCOUNTER — Encounter: Payer: Self-pay | Admitting: Oncology

## 2013-04-03 ENCOUNTER — Encounter: Payer: Self-pay | Admitting: Adult Health

## 2013-04-03 VITALS — BP 115/78 | HR 114 | Temp 98.4°F | Resp 18 | Ht 69.0 in | Wt 243.9 lb

## 2013-04-03 DIAGNOSIS — C50319 Malignant neoplasm of lower-inner quadrant of unspecified female breast: Secondary | ICD-10-CM

## 2013-04-03 DIAGNOSIS — I82409 Acute embolism and thrombosis of unspecified deep veins of unspecified lower extremity: Secondary | ICD-10-CM

## 2013-04-03 DIAGNOSIS — Z7901 Long term (current) use of anticoagulants: Secondary | ICD-10-CM

## 2013-04-03 DIAGNOSIS — Z170 Estrogen receptor positive status [ER+]: Secondary | ICD-10-CM

## 2013-04-03 DIAGNOSIS — C50312 Malignant neoplasm of lower-inner quadrant of left female breast: Secondary | ICD-10-CM

## 2013-04-03 DIAGNOSIS — Z5111 Encounter for antineoplastic chemotherapy: Secondary | ICD-10-CM

## 2013-04-03 LAB — CBC WITH DIFFERENTIAL/PLATELET
BASO%: 0.5 % (ref 0.0–2.0)
BASOS ABS: 0.1 10*3/uL (ref 0.0–0.1)
EOS%: 0 % (ref 0.0–7.0)
Eosinophils Absolute: 0 10*3/uL (ref 0.0–0.5)
HEMATOCRIT: 29 % — AB (ref 34.8–46.6)
HGB: 9.8 g/dL — ABNORMAL LOW (ref 11.6–15.9)
LYMPH#: 1.2 10*3/uL (ref 0.9–3.3)
LYMPH%: 12.7 % — ABNORMAL LOW (ref 14.0–49.7)
MCH: 31.3 pg (ref 25.1–34.0)
MCHC: 33.8 g/dL (ref 31.5–36.0)
MCV: 92.7 fL (ref 79.5–101.0)
MONO#: 0.6 10*3/uL (ref 0.1–0.9)
MONO%: 6.7 % (ref 0.0–14.0)
NEUT%: 80.1 % — AB (ref 38.4–76.8)
NEUTROS ABS: 7.7 10*3/uL — AB (ref 1.5–6.5)
Platelets: 176 10*3/uL (ref 145–400)
RBC: 3.13 10*6/uL — ABNORMAL LOW (ref 3.70–5.45)
RDW: 19.2 % — AB (ref 11.2–14.5)
WBC: 9.6 10*3/uL (ref 3.9–10.3)

## 2013-04-03 LAB — COMPREHENSIVE METABOLIC PANEL (CC13)
ALBUMIN: 3.4 g/dL — AB (ref 3.5–5.0)
ALT: 25 U/L (ref 0–55)
ANION GAP: 9 meq/L (ref 3–11)
AST: 18 U/L (ref 5–34)
Alkaline Phosphatase: 85 U/L (ref 40–150)
BUN: 10.6 mg/dL (ref 7.0–26.0)
CALCIUM: 9.6 mg/dL (ref 8.4–10.4)
CHLORIDE: 105 meq/L (ref 98–109)
CO2: 23 meq/L (ref 22–29)
CREATININE: 0.8 mg/dL (ref 0.6–1.1)
Glucose: 158 mg/dl — ABNORMAL HIGH (ref 70–140)
POTASSIUM: 4 meq/L (ref 3.5–5.1)
Sodium: 138 mEq/L (ref 136–145)
Total Bilirubin: 1.3 mg/dL — ABNORMAL HIGH (ref 0.20–1.20)
Total Protein: 6.4 g/dL (ref 6.4–8.3)

## 2013-04-03 MED ORDER — EPIRUBICIN HCL CHEMO IV INJECTION 200 MG/100ML
100.0000 mg/m2 | Freq: Once | INTRAVENOUS | Status: AC
  Administered 2013-04-03: 240 mg via INTRAVENOUS
  Filled 2013-04-03: qty 120

## 2013-04-03 MED ORDER — PALONOSETRON HCL INJECTION 0.25 MG/5ML
INTRAVENOUS | Status: AC
  Filled 2013-04-03: qty 5

## 2013-04-03 MED ORDER — HYDROCODONE-ACETAMINOPHEN 5-325 MG PO TABS: 1.0000 | ORAL_TABLET | ORAL | Status: DC | PRN

## 2013-04-03 MED ORDER — FOSAPREPITANT DIMEGLUMINE INJECTION 150 MG
150.0000 mg | Freq: Once | INTRAVENOUS | Status: AC
  Administered 2013-04-03: 150 mg via INTRAVENOUS
  Filled 2013-04-03: qty 5

## 2013-04-03 MED ORDER — HEPARIN SOD (PORK) LOCK FLUSH 100 UNIT/ML IV SOLN
500.0000 [IU] | Freq: Once | INTRAVENOUS | Status: AC | PRN
  Administered 2013-04-03: 500 [IU]
  Filled 2013-04-03: qty 5

## 2013-04-03 MED ORDER — DEXAMETHASONE SODIUM PHOSPHATE 20 MG/5ML IJ SOLN
INTRAMUSCULAR | Status: AC
  Filled 2013-04-03: qty 5

## 2013-04-03 MED ORDER — SODIUM CHLORIDE 0.9 % IV SOLN
500.0000 mg/m2 | Freq: Once | INTRAVENOUS | Status: AC
  Administered 2013-04-03: 1200 mg via INTRAVENOUS
  Filled 2013-04-03: qty 60

## 2013-04-03 MED ORDER — DEXAMETHASONE SODIUM PHOSPHATE 20 MG/5ML IJ SOLN
12.0000 mg | Freq: Once | INTRAMUSCULAR | Status: AC
  Administered 2013-04-03: 12 mg via INTRAVENOUS

## 2013-04-03 MED ORDER — SODIUM CHLORIDE 0.9 % IV SOLN
Freq: Once | INTRAVENOUS | Status: AC
  Administered 2013-04-03: 11:00:00 via INTRAVENOUS

## 2013-04-03 MED ORDER — SODIUM CHLORIDE 0.9 % IJ SOLN
10.0000 mL | INTRAMUSCULAR | Status: DC | PRN
  Administered 2013-04-03: 10 mL
  Filled 2013-04-03: qty 10

## 2013-04-03 MED ORDER — FLUOROURACIL CHEMO INJECTION 2.5 GM/50ML
500.0000 mg/m2 | Freq: Once | INTRAVENOUS | Status: AC
  Administered 2013-04-03: 1200 mg via INTRAVENOUS
  Filled 2013-04-03: qty 24

## 2013-04-03 MED ORDER — PALONOSETRON HCL INJECTION 0.25 MG/5ML
0.2500 mg | Freq: Once | INTRAVENOUS | Status: AC
  Administered 2013-04-03: 0.25 mg via INTRAVENOUS

## 2013-04-03 NOTE — Progress Notes (Signed)
[facility]  Telephone:([phone]) [phone] Fax:(336) [phone]  OFFICE PROGRESS NOTE  ID: [name]   DOB: [birth year]  MR#: [id number]  CLE#:[id number]   PCP: Stacie L. Sherre Lain SU:  Jovita Kussmaul, MD  RAD ONC:  Arloa Koh, MD   DIAGNOSIS: [name] is a 38 y.o. 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  She was seen in the multidisciplinary breast clinic on 10/23/2012 for discussion of treatment options.    STAGE:  Cancer of lower-inner quadrant of Cox breast  Primary site: Breast (Left)  Staging method: AJCC 7th Edition  Clinical: Stage IA (T1c, N0, cM0)  Summary: Stage IA (T1c, N0, cM0)   PRIOR THERAPY: #1  A screening mammogram in [place], [place] on 09/23/2012 she was felt to have a new mass within the left breast. An ultrasound on 10/06/2012 showed a 1.6 x 1.3 x 1.3 cm mass at the 8 o'clock position 7 cm from the nipple. At the 12 o'clock position behind and nipple was a circumscribed oval shaped 2 x 1.8 x 1.2 cm lesion perhaps representing a fibroadenoma. 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 The carcinoma was grade 2-3, estrogen receptor 80% positive, progesterone receptor 80% positive, Ki-67 25%, and HER-2/neu negative. Bilateral breast MRI on 10/22/2012 showed biopsy proven carcinoma in the left lower inner quadrant identified and measures approximately 1.9 x 1.9 x 1.8 cm.  Well-defined nodule in the right upper outer quadrant posteriorly consistent with a fibroadenoma. Probable liver cysts.There were multiple scattered nonspecific foci in each breast. No other suspicious abnormality was identified. No abnormal appearing lymph nodes (Clinical stage I, T1 N0).   #2 Status post left breast lumpectomy with left axillary sentinel lymph node biopsy on 12/10/2012 for a stage IIB, pT2,  pN13m, MX, 2.3 cm invasive ductal carcinoma, grade 3, estrogen receptor 80% positive, progesterone receptor 80% positive, Ki-67 25%, HER-2/neu negative, with 1/2 left axillary sentinel lymph nodes positive for microscopic disease.   #3 [place] BRCA1 and BRCA2 negative.  #4 Adjuvant chemotherapy consisting of FEC (5-FU/epirubicin/Cytoxan) began on 01/23/2013. Six cycles of FEC is planned. FEC will then be followed by weekly adjuvant chemotherapy consisting of Taxol for 12 weeks.  #5  Right lower extremity DVT diagnosed on 03/16/2013, patient was placed on Xarelto 175mBID.    CURRENT THERAPY:  FEC cycle 6 day 1  INTERVAL HISTORY: [name] a  [age].o. Cox who returns today for evaluation prior to receiving her sixth cycle of FEC.  She is feeling much better since receiving a PRBC transfusion last week.  She continues to take Xarelto BID and is tolerating it well.  She continues to have some right calf pain, but it continues to improve and not as bad as it was when she was diagnosed with the DVT.  She denies chest pain, shortness of breath.  She denies fevers, chills, nausea, vomiting, constipation, diarrhea, numbness, nail changes, mucositis or any other concerns.   MEDICAL HISTORY: Past Medical History  Diagnosis Date  . Urinary incontinence     occasional  . Family history of anesthesia complication     pt's mother has hx. of post-op N/V  . Hypertension     under control with med., has been on med. since 09/2012  . Anxiety   .  Breast cancer     left    ALLERGIES:   Allergies  Allergen Reactions  . Amoxicillin Other (See Comments)    States "it does not work"    MEDICATIONS:  Current Outpatient Prescriptions  Medication Sig Dispense Refill  . ALPRAZolam (XANAX) 0.25 MG tablet Take 1 tablet (0.25 mg total) by mouth at bedtime as needed for sleep.  30 tablet  2  . ciprofloxacin (CIPRO) 500 MG  tablet       . dexamethasone (DECADRON) 4 MG tablet Take 2 tablets by mouth once a day on the day after chemotherapy and then take 2 tablets two times a day for 2 days. Take with food.  30 tablet  1  . lidocaine-prilocaine (EMLA) cream Apply topically as needed.  30 g  0  . lisinopril (PRINIVIL,ZESTRIL) 10 MG tablet Take 10 mg by mouth daily.      Marland Kitchen LORazepam (ATIVAN) 0.5 MG tablet Take 1 tablet (0.5 mg total) by mouth every 6 (six) hours as needed (Nausea or vomiting).  30 tablet  0  . omeprazole (PRILOSEC) 20 MG capsule TAKE ONE CAPSULE BY MOUTH EVERY DAY  30 capsule  2  . prochlorperazine (COMPAZINE) 10 MG tablet Take 1 tablet (10 mg total) by mouth every 6 (six) hours as needed (Nausea or vomiting).  30 tablet  1  . Rivaroxaban (XARELTO) 15 MG TABS tablet Take 1 tablet (15 mg total) by mouth 2 (two) times daily with a meal.  42 tablet  0  . HYDROcodone-acetaminophen (NORCO/VICODIN) 5-325 MG per tablet Take 1-2 tablets by mouth every 4 (four) hours as needed.  60 tablet  0  . ondansetron (ZOFRAN) 8 MG tablet Take 1 tablet (8 mg total) by mouth 2 (two) times daily as needed. Take two times a day as needed for nausea or vomiting starting on the third day after chemotherapy.  30 tablet  1   No current facility-administered medications for this visit.    SURGICAL HISTORY:  Past Surgical History  Procedure Laterality Date  . Cesarean section  1999  . Breast lumpectomy with needle localization and axillary sentinel lymph node bx Left 12/10/2012    Procedure: BREAST LUMPECTOMY WITH NEEDLE LOCALIZATION AND AXILLARY SENTINEL LYMPH NODE BX;  Surgeon: Merrie Roof, MD;  Location: [facility];  Service: General;  Laterality: Left;  Needle loc BCG 7:30  nuc med 9:30    . Tonsillectomy and adenoidectomy      as a teenager  . Cholecystectomy  1999  . Open reduction internal fixation (orif) tibia/fibula fracture Right   . Portacath placement Right 01/09/2013    Procedure: INSERTION PORT-A-CATH;  Surgeon:  Merrie Roof, MD;  Location: [facility];  Service: General;  Laterality: Right;    REVIEW OF SYSTEMS:  A 10 point review of systems was completed and is negative except as noted above.    PHYSICAL EXAMINATION: Blood pressure 115/78, pulse 114, temperature 98.4 F (36.9 C), temperature source Oral, resp. rate 18, height _0  (1.753 m), weight 243 lb 14.4 oz (110.632 kg). Body mass index is 36 kg/(m^2). GENERAL: Patient is a well appearing Cox in no acute distress HEENT:  Sclerae anicteric.  Oropharynx clear and moist. No ulcerations or evidence of oropharyngeal candidiasis. Neck is supple.  NODES:  No cervical, supraclavicular, or axillary lymphadenopathy palpated.  BREAST EXAM:  Deferred. LUNGS:  Clear to auscultation bilaterally.  No wheezes or rhonchi. HEART:  Regular rate and rhythm. No murmur appreciated.  ABDOMEN:  Soft, nontender.  Positive, normoactive bowel sounds. No organomegaly palpated. MSK:  No focal spinal tenderness to palpation. Full range of motion bilaterally in the upper extremities. EXTREMITIES:  No peripheral edema.   SKIN:  Clear with no obvious rashes or skin changes. No nail dyscrasia. NEURO:  Nonfocal. Well oriented.  Appropriate affect. ECOG PERFORMANCE STATUS: 0 - Asymptomatic   LABORATORY DATA: Lab Results  Component Value Date   WBC 9.6 04/03/2013   HGB 9.8* 04/03/2013   HCT 29.0* 04/03/2013   MCV 92.7 04/03/2013   PLT 176 04/03/2013      Chemistry      Component Value Date/Time   NA 138 04/03/2013 1026   NA 136 01/23/2013 1413   K 4.0 04/03/2013 1026   K 3.7 01/23/2013 1413   CL 102 01/23/2013 1413   CO2 23 04/03/2013 1026   CO2 27 01/23/2013 1413   BUN 10.6 04/03/2013 1026   BUN 12 01/23/2013 1413   CREATININE 0.8 04/03/2013 1026   CREATININE 0.68 01/23/2013 1413      Component Value Date/Time   CALCIUM 9.6 04/03/2013 1026   CALCIUM 9.3 01/23/2013 1413   ALKPHOS 85 04/03/2013 1026   ALKPHOS 74 01/23/2013 1413   AST 18 04/03/2013 1026    AST 27 01/23/2013 1413   ALT 25 04/03/2013 1026   ALT 30 01/23/2013 1413   BILITOT 1.30* 04/03/2013 1026   BILITOT 0.5 01/23/2013 1413      RADIOGRAPHIC STUDIES: No results found.   ASSESSMENT:   [name] is a 38 y.o. [name], [name] with:  #1 Stage II, invasive ductal carcinoma of the left breast, grade 3, estrogen receptor/progesterone receptor positive, Ki-67 25%, HER-2/neu negative. Status post left breast lumpectomy with left axillary sentinel lymph node biopsy on 12/10/2012 with 1 of 2 sentinel nodes revealing microscopic disease. The patient elected not to receive axillary lymph node dissection and will undergo radiation therapy after the completion of chemotherapy.    #2 Started adjuvant chemotherapy on 01/23/2013 consisting of FEC (5-FU/Epirubicin/Cytoxan) with Neulasta support to be given every 2 weeks for a total of 6 cycles.  This will be followed by adjuvant chemotherapy consisting of Taxol on a weekly basis for 12 weeks. Port-A-Cath placement on 01/09/2013, 2D echocardiogram with LVEF of 50% - 55%  and chemotherapy class on 01/12/2013. She underwent a repeat echocardiogram on 03/05/13 that demonstrated an improvement in her LVEF to 55-60%, she was evaluated by Dr. Haroldine Laws on 03/09/13 who cleared her to continue with Epirubicin therapy, and will f/u with her in 3 months.    #3 Right lower extremity DVT on 03/16/13, on Xarelto.  A hypercoagulable panel is pending.   PLAN:  #1  [name] is feeling much better today.  Her labs have recovered, she will proceed with her sixth and final cycle of FEC today.    #2  She will continue to take Xarelto BID.     #3  She will return tomorrow for Neulasta and in one week for labs and evaluation of chemotoxicities.      All questions answered. [name] was encouraged to contact us in the interim with any questions, concerns, or problems  I spent 25 minutes counseling the patient face to face.  The total time spent in the  appointment was 30 minutes.  Minette Headland, [place] [phone] 04/04/2013, 11:35 AM

## 2013-04-03 NOTE — Progress Notes (Signed)
[facility] blood return noted before, during and after Ellence and Adrucil.

## 2013-04-03 NOTE — Patient Instructions (Signed)
[facility] Discharge Instructions for Patients Receiving Chemotherapy  Today you received the following chemotherapy agents Ellence, Adrucil and Cytoxan.  To help prevent nausea and vomiting after your treatment, we encourage you to take your nausea medication as prescribed.   If you develop nausea and vomiting that is not controlled by your nausea medication, call the clinic.   BELOW ARE SYMPTOMS THAT SHOULD BE REPORTED IMMEDIATELY:  *FEVER GREATER THAN 100.5 F  *CHILLS WITH OR WITHOUT FEVER  NAUSEA AND VOMITING THAT IS NOT CONTROLLED WITH YOUR NAUSEA MEDICATION  *UNUSUAL SHORTNESS OF BREATH  *UNUSUAL BRUISING OR BLEEDING  TENDERNESS IN MOUTH AND THROAT WITH OR WITHOUT PRESENCE OF ULCERS  *URINARY PROBLEMS  *BOWEL PROBLEMS  UNUSUAL RASH Items with * indicate a potential emergency and should be followed up as soon as possible.  Feel free to call the clinic you have any questions or concerns. The clinic phone number is [phone].

## 2013-04-04 ENCOUNTER — Ambulatory Visit (HOSPITAL_BASED_OUTPATIENT_CLINIC_OR_DEPARTMENT_OTHER): Payer: BC Managed Care – PPO

## 2013-04-04 VITALS — BP 122/71 | HR 82 | Temp 96.9°F | Resp 20

## 2013-04-04 DIAGNOSIS — C50319 Malignant neoplasm of lower-inner quadrant of unspecified female breast: Secondary | ICD-10-CM

## 2013-04-04 DIAGNOSIS — Z5189 Encounter for other specified aftercare: Secondary | ICD-10-CM

## 2013-04-04 MED ORDER — PEGFILGRASTIM INJECTION 6 MG/0.6ML
6.0000 mg | Freq: Once | SUBCUTANEOUS | Status: AC
  Administered 2013-04-04: 6 mg via SUBCUTANEOUS

## 2013-04-04 NOTE — Patient Instructions (Signed)

## 2013-04-10 ENCOUNTER — Ambulatory Visit (HOSPITAL_BASED_OUTPATIENT_CLINIC_OR_DEPARTMENT_OTHER): Payer: BC Managed Care – PPO | Admitting: Adult Health

## 2013-04-10 ENCOUNTER — Telehealth: Payer: Self-pay | Admitting: *Deleted

## 2013-04-10 ENCOUNTER — Other Ambulatory Visit (HOSPITAL_BASED_OUTPATIENT_CLINIC_OR_DEPARTMENT_OTHER): Payer: BC Managed Care – PPO

## 2013-04-10 ENCOUNTER — Telehealth: Payer: Self-pay | Admitting: Oncology

## 2013-04-10 ENCOUNTER — Encounter: Payer: Self-pay | Admitting: Adult Health

## 2013-04-10 VITALS — BP 116/75 | HR 85 | Temp 98.5°F | Resp 20 | Ht 69.0 in | Wt 247.5 lb

## 2013-04-10 DIAGNOSIS — C50319 Malignant neoplasm of lower-inner quadrant of unspecified female breast: Secondary | ICD-10-CM

## 2013-04-10 DIAGNOSIS — Z170 Estrogen receptor positive status [ER+]: Secondary | ICD-10-CM

## 2013-04-10 DIAGNOSIS — C50312 Malignant neoplasm of lower-inner quadrant of left female breast: Secondary | ICD-10-CM

## 2013-04-10 DIAGNOSIS — I82409 Acute embolism and thrombosis of unspecified deep veins of unspecified lower extremity: Secondary | ICD-10-CM

## 2013-04-10 LAB — COMPREHENSIVE METABOLIC PANEL (CC13)
ALBUMIN: 3.5 g/dL (ref 3.5–5.0)
ALT: 15 U/L (ref 0–55)
AST: 6 U/L (ref 5–34)
Alkaline Phosphatase: 76 U/L (ref 40–150)
Anion Gap: 9 mEq/L (ref 3–11)
BUN: 17 mg/dL (ref 7.0–26.0)
CO2: 27 mEq/L (ref 22–29)
Calcium: 9.4 mg/dL (ref 8.4–10.4)
Chloride: 103 mEq/L (ref 98–109)
Creatinine: 0.7 mg/dL (ref 0.6–1.1)
Glucose: 141 mg/dl — ABNORMAL HIGH (ref 70–140)
POTASSIUM: 3.7 meq/L (ref 3.5–5.1)
SODIUM: 139 meq/L (ref 136–145)
TOTAL PROTEIN: 6 g/dL — AB (ref 6.4–8.3)
Total Bilirubin: 1.19 mg/dL (ref 0.20–1.20)

## 2013-04-10 LAB — CBC WITH DIFFERENTIAL/PLATELET
BASO%: 0 % (ref 0.0–2.0)
BASOS ABS: 0 10*3/uL (ref 0.0–0.1)
EOS%: 0 % (ref 0.0–7.0)
Eosinophils Absolute: 0 10*3/uL (ref 0.0–0.5)
HCT: 25.4 % — ABNORMAL LOW (ref 34.8–46.6)
HEMOGLOBIN: 8.8 g/dL — AB (ref 11.6–15.9)
LYMPH#: 0.7 10*3/uL — AB (ref 0.9–3.3)
LYMPH%: 81.6 % — ABNORMAL HIGH (ref 14.0–49.7)
MCH: 31.8 pg (ref 25.1–34.0)
MCHC: 34.6 g/dL (ref 31.5–36.0)
MCV: 91.7 fL (ref 79.5–101.0)
MONO#: 0.1 10*3/uL (ref 0.1–0.9)
MONO%: 8 % (ref 0.0–14.0)
NEUT#: 0.1 10*3/uL — CL (ref 1.5–6.5)
NEUT%: 10.4 % — ABNORMAL LOW (ref 38.4–76.8)
Platelets: 155 10*3/uL (ref 145–400)
RBC: 2.77 10*6/uL — AB (ref 3.70–5.45)
RDW: 17 % — AB (ref 11.2–14.5)
WBC: 0.9 10*3/uL — CL (ref 3.9–10.3)
nRBC: 0 % (ref 0–0)

## 2013-04-10 MED ORDER — RIVAROXABAN 20 MG PO TABS: 20.0000 mg | ORAL_TABLET | Freq: Every day | ORAL | Status: DC

## 2013-04-10 MED ORDER — DEXAMETHASONE 4 MG PO TABS: 8.0000 mg | ORAL_TABLET | Freq: Two times a day (BID) | ORAL | Status: DC

## 2013-04-10 MED ORDER — ONDANSETRON HCL 8 MG PO TABS: 8.0000 mg | ORAL_TABLET | Freq: Two times a day (BID) | ORAL | Status: DC

## 2013-04-10 MED ORDER — PROCHLORPERAZINE MALEATE 10 MG PO TABS: 10.0000 mg | ORAL_TABLET | Freq: Four times a day (QID) | ORAL | Status: DC | PRN

## 2013-04-10 MED ORDER — LORAZEPAM 0.5 MG PO TABS: 0.5000 mg | ORAL_TABLET | Freq: Four times a day (QID) | ORAL | Status: DC | PRN

## 2013-04-10 NOTE — Telephone Encounter (Signed)
Per staff message and POF I have scheduled appts.  JMW  

## 2013-04-10 NOTE — Patient Instructions (Addendum)
Patient Neutropenia Instruction Sheet  Diagnosis: Breast Cancer      Treating Physician: Marcy Panning, MD  Treatment: 1. Type of chemotherapy:  2. Date of last treatment:   Last Blood Counts: Lab Results  Component Value Date   WBC 0.9* 04/10/2013   HGB 8.8* 04/10/2013   HCT 25.4* 04/10/2013   MCV 91.7 04/10/2013   PLT 155 04/10/2013  ANC 100     Prophylactic Antibiotics: Cipro 500 mg by mouth twice a day Instructions: 1. Monitor temperature and call if fever  greater than 100.5, chills, shaking chills (rigors) 2. Call Physician on-call at [phone] 3. Give him/her symptoms and list of medications that you are taking and your last blood count.   Paclitaxel injection What is this medicine? PACLITAXEL (PAK li TAX el) is a chemotherapy drug. It targets fast dividing cells, like cancer cells, and causes these cells to die. This medicine is used to treat ovarian cancer, breast cancer, and other cancers. This medicine may be used for other purposes; ask your health care provider or pharmacist if you have questions. COMMON BRAND NAME(S): Onxol , Taxol What should I tell my health care provider before I take this medicine? They need to know if you have any of these conditions: -blood disorders -irregular heartbeat -infection (especially a virus infection such as chickenpox, cold sores, or herpes) -liver disease -previous or ongoing radiation therapy -an unusual or allergic reaction to paclitaxel, alcohol, polyoxyethylated castor oil, other chemotherapy agents, other medicines, foods, dyes, or preservatives -pregnant or trying to get pregnant -breast-feeding How should I use this medicine? This drug is given as an infusion into a vein. It is administered in a hospital or clinic by a specially trained health care professional. Talk to your pediatrician regarding the use of this medicine in children. Special care may be needed. Overdosage: If you think you have taken too much of  this medicine contact a poison control center or emergency room at once. NOTE: This medicine is only for you. Do not share this medicine with others. What if I miss a dose? It is important not to miss your dose. Call your doctor or health care professional if you are unable to keep an appointment. What may interact with this medicine? Do not take this medicine with any of the following medications: -disulfiram -metronidazole This medicine may also interact with the following medications: -cyclosporine -diazepam -ketoconazole -medicines to increase blood counts like filgrastim, pegfilgrastim, sargramostim -other chemotherapy drugs like cisplatin, doxorubicin, epirubicin, etoposide, teniposide, vincristine -quinidine -testosterone -vaccines -verapamil Talk to your doctor or health care professional before taking any of these medicines: -acetaminophen -aspirin -ibuprofen -ketoprofen -naproxen This list may not describe all possible interactions. Give your health care provider a list of all the medicines, herbs, non-prescription drugs, or dietary supplements you use. Also tell them if you smoke, drink alcohol, or use illegal drugs. Some items may interact with your medicine. What should I watch for while using this medicine? Your condition will be monitored carefully while you are receiving this medicine. You will need important blood work done while you are taking this medicine. This drug may make you feel generally unwell. This is not uncommon, as chemotherapy can affect healthy cells as well as cancer cells. Report any side effects. Continue your course of treatment even though you feel ill unless your doctor tells you to stop. In some cases, you may be given additional medicines to help with side effects. Follow all directions for their use. Call your doctor or  health care professional for advice if you get a fever, chills or sore throat, or other symptoms of a cold or flu. Do not treat  yourself. This drug decreases your body's ability to fight infections. Try to avoid being around people who are sick. This medicine may increase your risk to bruise or bleed. Call your doctor or health care professional if you notice any unusual bleeding. Be careful brushing and flossing your teeth or using a toothpick because you may get an infection or bleed more easily. If you have any dental work done, tell your dentist you are receiving this medicine. Avoid taking products that contain aspirin, acetaminophen, ibuprofen, naproxen, or ketoprofen unless instructed by your doctor. These medicines may hide a fever. Do not become pregnant while taking this medicine. Women should inform their doctor if they wish to become pregnant or think they might be pregnant. There is a potential for serious side effects to an unborn child. Talk to your health care professional or pharmacist for more information. Do not breast-feed an infant while taking this medicine. Men are advised not to father a child while receiving this medicine. What side effects may I notice from receiving this medicine? Side effects that you should report to your doctor or health care professional as soon as possible: -allergic reactions like skin rash, itching or hives, swelling of the face, lips, or tongue -low blood counts - This drug may decrease the number of white blood cells, red blood cells and platelets. You may be at increased risk for infections and bleeding. -signs of infection - fever or chills, cough, sore throat, pain or difficulty passing urine -signs of decreased platelets or bleeding - bruising, pinpoint red spots on the skin, black, tarry stools, nosebleeds -signs of decreased red blood cells - unusually weak or tired, fainting spells, lightheadedness -breathing problems -chest pain -high or low blood pressure -mouth sores -nausea and vomiting -pain, swelling, redness or irritation at the injection site -pain,  tingling, numbness in the hands or feet -slow or irregular heartbeat -swelling of the ankle, feet, hands Side effects that usually do not require medical attention (report to your doctor or health care professional if they continue or are bothersome): -bone pain -complete hair loss including hair on your head, underarms, pubic hair, eyebrows, and eyelashes -changes in the color of fingernails -diarrhea -loosening of the fingernails -loss of appetite -muscle or joint pain -red flush to skin -sweating This list may not describe all possible side effects. Call your doctor for medical advice about side effects. You may report side effects to FDA at 1-800-FDA-1088. Where should I keep my medicine? This drug is given in a hospital or clinic and will not be stored at home. NOTE: This sheet is a summary. It may not cover all possible information. If you have questions about this medicine, talk to your doctor, pharmacist, or health care provider.  2014, [employer]. (2012-04-28 16:41:21)

## 2013-04-10 NOTE — Progress Notes (Addendum)
[place]  [phone] [phone]) [phone]  OFFICE PROGRESS NOTE  ID: [name]   DOB: 08/09/1975  MR#: [id number]  TMH#:[id number]   PCP: Stacie L. Sherre Lain SU:  Jovita Kussmaul, MD  RAD ONC:  Arloa Koh, MD   DIAGNOSIS: [name] is a 38 y.o. 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  She was seen in the multidisciplinary breast clinic on 10/23/2012 for discussion of treatment options.    STAGE:  Cancer of lower-inner quadrant of female breast  Primary site: Breast (Left)  Staging method: AJCC 7th Edition  Clinical: Stage IA (T1c, N0, cM0)  Summary: Stage IA (T1c, N0, cM0)   PRIOR THERAPY: #1  A screening mammogram in [place], [place] on 09/23/2012 she was felt to have a new mass within the left breast. An ultrasound on 10/06/2012 showed a 1.6 x 1.3 x 1.3 cm mass at the 8 o'clock position 7 cm from the nipple. At the 12 o'clock position behind and nipple was a circumscribed oval shaped 2 x 1.8 x 1.2 cm lesion perhaps representing a fibroadenoma. 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 The carcinoma was grade 2-3, estrogen receptor 80% positive, progesterone receptor 80% positive, Ki-67 25%, and HER-2/neu negative. Bilateral breast MRI on 10/22/2012 showed biopsy proven carcinoma in the left lower inner quadrant identified and measures approximately 1.9 x 1.9 x 1.8 cm.  Well-defined nodule in the right upper outer quadrant posteriorly consistent with a fibroadenoma. Probable liver cysts.There were multiple scattered nonspecific foci in each breast. No other suspicious abnormality was identified. No abnormal appearing lymph nodes (Clinical stage I, T1 N0).   #2 Status post left breast lumpectomy with left axillary sentinel lymph node biopsy on 12/10/2012 for a stage IIB, pT2,  pN16mi, MX, 2.3 cm invasive ductal carcinoma, grade 3, estrogen receptor 80% positive, progesterone receptor 80% positive, Ki-67 25%, HER-2/neu negative, with 1/2 left axillary sentinel lymph nodes positive for microscopic disease.   #3 Genetic testing performed in [place], [place] with self reported by the patient as BRCA1 and BRCA2 negative.  #4 Adjuvant chemotherapy consisting of FEC (5-FU/epirubicin/Cytoxan) began on 01/23/2013. She completed 6 cycles of FEC on 04/03/13.  This will be followed by weekly adjuvant chemotherapy consisting of Taxol for 12 weeks.  #5  Right lower extremity DVT diagnosed on 03/16/2013, patient was placed on Xarelto $RemoveBe'15mg'ABWQjhvDS$  BID.  Her dose was changed to $RemoveBe'20mg'LtrgfvVir$  daily on 04/10/13.  CURRENT THERAPY:  FEC cycle 6 day 8  INTERVAL HISTORY: [name] is a  38 y.o. female who returns today for evaluation after receiving her sixth cycle of FEC.  She is doing essentially well.  Her calf is still aching.  She does admit that she has not always remembered to take her night time dose of Xarelto.  She also has had to drive for 2 hours related to her job although she was given a note not to drive more than one hour at a time for work.  She is neutropenic today.  She denies fevers, chills, nausea, vomiting, constipation, diarrhea, numbness, mucositis, skin changes or any further concerns.    MEDICAL HISTORY: Past Medical History  Diagnosis Date  . Urinary incontinence     occasional  . Family history of anesthesia complication     pt's mother has hx. of post-op N/V  . Hypertension  under control with med., has been on med. since 09/2012  . Anxiety   . Breast cancer     left    ALLERGIES:   Allergies  Allergen Reactions  . Amoxicillin Other (See Comments)    States "it does not work"    MEDICATIONS:  Current Outpatient Prescriptions  Medication Sig Dispense Refill  . ALPRAZolam (XANAX) 0.25 MG tablet Take 1 tablet (0.25 mg total) by mouth at bedtime as needed for  sleep.  30 tablet  2  . HYDROcodone-acetaminophen (NORCO/VICODIN) 5-325 MG per tablet Take 1-2 tablets by mouth every 4 (four) hours as needed.  60 tablet  0  . lidocaine-prilocaine (EMLA) cream Apply topically as needed.  30 g  0  . lisinopril (PRINIVIL,ZESTRIL) 10 MG tablet Take 10 mg by mouth daily.      Marland Kitchen omeprazole (PRILOSEC) 20 MG capsule TAKE ONE CAPSULE BY MOUTH EVERY DAY  30 capsule  2  . dexamethasone (DECADRON) 4 MG tablet Take 2 tablets by mouth once a day on the day after chemotherapy and then take 2 tablets two times a day for 2 days. Take with food.  30 tablet  1  . dexamethasone (DECADRON) 4 MG tablet Take 2 tablets (8 mg total) by mouth 2 (two) times daily with a meal. Take daily starting the day after chemotherapy for 2 days. Take with food.  30 tablet  1  . LORazepam (ATIVAN) 0.5 MG tablet Take 1 tablet (0.5 mg total) by mouth every 6 (six) hours as needed (Nausea or vomiting).  30 tablet  0  . LORazepam (ATIVAN) 0.5 MG tablet Take 1 tablet (0.5 mg total) by mouth every 6 (six) hours as needed (Nausea or vomiting).  30 tablet  0  . ondansetron (ZOFRAN) 8 MG tablet Take 1 tablet (8 mg total) by mouth 2 (two) times daily as needed. Take two times a day as needed for nausea or vomiting starting on the third day after chemotherapy.  30 tablet  1  . ondansetron (ZOFRAN) 8 MG tablet Take 1 tablet (8 mg total) by mouth 2 (two) times daily. Take two times a day starting the day after chemo for 2 days. Then take two times a day as needed for nausea or vomiting.  30 tablet  1  . prochlorperazine (COMPAZINE) 10 MG tablet Take 1 tablet (10 mg total) by mouth every 6 (six) hours as needed (Nausea or vomiting).  30 tablet  1  . prochlorperazine (COMPAZINE) 10 MG tablet Take 1 tablet (10 mg total) by mouth every 6 (six) hours as needed (Nausea or vomiting).  30 tablet  1  . Rivaroxaban (XARELTO) 20 MG TABS tablet Take 1 tablet (20 mg total) by mouth daily with supper.  30 tablet  6   No current  facility-administered medications for this visit.    SURGICAL HISTORY:  Past Surgical History  Procedure Laterality Date  . Cesarean section  1999  . Breast lumpectomy with needle localization and axillary sentinel lymph node bx Left 12/10/2012    Procedure: BREAST LUMPECTOMY WITH NEEDLE LOCALIZATION AND AXILLARY SENTINEL LYMPH NODE BX;  Surgeon: Merrie Roof, MD;  Location: [facility];  Service: General;  Laterality: Left;  Needle loc BCG 7:30  nuc med 9:30    . Tonsillectomy and adenoidectomy      as a teenager  . Cholecystectomy  1999  . Open reduction internal fixation (orif) tibia/fibula fracture Right   . Portacath placement Right 01/09/2013    Procedure:  INSERTION PORT-A-CATH;  Surgeon: Merrie Roof, MD;  Location: [facility];  Service: General;  Laterality: Right;    REVIEW OF SYSTEMS:  A 10 point review of systems was completed and is negative except as noted above.    PHYSICAL EXAMINATION: Blood pressure 116/75, pulse 85, temperature 98.5 F (36.9 C), temperature source Oral, resp. rate 20, height $RemoveBe'5\' 9"'QRyMqeDUl$  (1.753 m), weight 247 lb 8 oz (112.265 kg). Body mass index is 36.53 kg/(m^2). GENERAL: Patient is a well appearing female in no acute distress HEENT:  Sclerae anicteric.  Oropharynx clear and moist. No ulcerations or evidence of oropharyngeal candidiasis. Neck is supple.  NODES:  No cervical, supraclavicular, or axillary lymphadenopathy palpated.  BREAST EXAM:  Deferred. LUNGS:  Clear to auscultation bilaterally.  No wheezes or rhonchi. HEART:  Regular rate and rhythm. No murmur appreciated. ABDOMEN:  Soft, nontender.  Positive, normoactive bowel sounds. No organomegaly palpated. MSK:  No focal spinal tenderness to palpation. Full range of motion bilaterally in the upper extremities. EXTREMITIES:  No peripheral edema.  2+ pedal pulses SKIN:  Clear with no obvious rashes or skin changes. No nail dyscrasia. NEURO:  Nonfocal. Well oriented.  Appropriate  affect. ECOG PERFORMANCE STATUS: 0 - Asymptomatic   LABORATORY DATA: Lab Results  Component Value Date   WBC 0.9* 04/10/2013   HGB 8.8* 04/10/2013   HCT 25.4* 04/10/2013   MCV 91.7 04/10/2013   PLT 155 04/10/2013      Chemistry      Component Value Date/Time   NA 139 04/10/2013 1115   NA 136 01/23/2013 1413   K 3.7 04/10/2013 1115   K 3.7 01/23/2013 1413   CL 102 01/23/2013 1413   CO2 27 04/10/2013 1115   CO2 27 01/23/2013 1413   BUN 17.0 04/10/2013 1115   BUN 12 01/23/2013 1413   CREATININE 0.7 04/10/2013 1115   CREATININE 0.68 01/23/2013 1413      Component Value Date/Time   CALCIUM 9.4 04/10/2013 1115   CALCIUM 9.3 01/23/2013 1413   ALKPHOS 76 04/10/2013 1115   ALKPHOS 74 01/23/2013 1413   AST 6 04/10/2013 1115   AST 27 01/23/2013 1413   ALT 15 04/10/2013 1115   ALT 30 01/23/2013 1413   BILITOT 1.19 04/10/2013 1115   BILITOT 0.5 01/23/2013 1413      RADIOGRAPHIC STUDIES: No results found.   ASSESSMENT:   [name] is a 38 y.o. [place], [place] female with:  #1 Stage II, invasive ductal carcinoma of the left breast, grade 3, estrogen receptor/progesterone receptor positive, Ki-67 25%, HER-2/neu negative. Status post left breast lumpectomy with left axillary sentinel lymph node biopsy on 12/10/2012 with 1 of 2 sentinel nodes revealing microscopic disease. The patient elected not to receive axillary lymph node dissection and will undergo radiation therapy after the completion of chemotherapy.    #2 status post adjuvant chemotherapy on 01/23/2013 consisting of FEC (5-FU/Epirubicin/Cytoxan) with Neulasta support given every 2 weeks for a total of 6 cycles.  This will be followed by adjuvant chemotherapy consisting of Taxol on a weekly basis for 12 weeks starting 04/17/13. Port-A-Cath placement on 01/09/2013, 2D echocardiogram with LVEF of 50% - 55%  and chemotherapy class on 01/12/2013. She underwent a repeat echocardiogram on 03/05/13 that demonstrated an improvement in her LVEF to 55-60%,  she was evaluated by Dr. Haroldine Laws on 03/09/13 who cleared her to continue with Epirubicin therapy, and will f/u with her in 3 months.    #3 Right lower extremity DVT on  03/16/13, on Xarelto.  A hypercoagulable panel was drawn and demonstrated a positive lupus anticoagulant.  We will retest this in 6-8 weeks.   PLAN:  #1  [name] is doing well today.  She is subsequently neutropenic following her final cycle of FEC.  She has no signs of infection.  She will have her cipro refilled.  I reviewed neutropenic precautions with her in detail.    #2 Due to her right lower extremity DVT, we discussed needing to start her on Zoladex in preparation for her to receive an aromatase inhibitor following radiation therapy.  This was discussed in great detail with the patient by Dr. Humphrey Rolls.  She will start this next week.    #3 She will return in one week for labs, evaluation, and her first weekly Taxol and Zoladex.    All questions answered. [name] was encouraged to contact us in the interim with any questions, concerns, or problems  I spent 25 minutes counseling the patient face to face.  The total time spent in the appointment was 30 minutes.  Minette Headland, [place] [phone] 04/12/2013, 9:44 AM  ATTENDING'S ATTESTATION:  I personally reviewed patient's chart, examined patient myself, formulated the treatment plan as followed.    38 year old female with stage II invasive ductal carcinoma of the left breast grade 3 ER PR positive HER-2/neu negative status post lumpectomy. Patient is receiving adjuvant chemotherapy initially consisting of 6 cycles of FEC.she is neutropenic she will begin Cipro 500 mg twice a day. She'll be seen back in one week's time for followup. She'll continue xeralto for right lower extremity DVT. She most likely will be on this indefinitely.  Marcy Panning, MD Medical/Oncology [facility] [phone]  (beeper) [phone] (Office)  04/20/2013, 12:29 AM

## 2013-04-15 ENCOUNTER — Telehealth: Payer: Self-pay | Admitting: Emergency Medicine

## 2013-04-15 NOTE — Telephone Encounter (Signed)
Called patient to follow up on her fever; no answer; left message for patient to call this office.

## 2013-04-16 ENCOUNTER — Telehealth: Payer: Self-pay | Admitting: Emergency Medicine

## 2013-04-16 NOTE — Telephone Encounter (Signed)
Patient called to report that she is feeling much better and fever has resolved. Advised patient to call for any future concerns or questions. Advised patient to keep all future appointments as well.

## 2013-04-17 ENCOUNTER — Ambulatory Visit (HOSPITAL_BASED_OUTPATIENT_CLINIC_OR_DEPARTMENT_OTHER): Payer: BC Managed Care – PPO | Admitting: Adult Health

## 2013-04-17 ENCOUNTER — Ambulatory Visit (HOSPITAL_BASED_OUTPATIENT_CLINIC_OR_DEPARTMENT_OTHER): Payer: BC Managed Care – PPO

## 2013-04-17 ENCOUNTER — Other Ambulatory Visit (HOSPITAL_BASED_OUTPATIENT_CLINIC_OR_DEPARTMENT_OTHER): Payer: BC Managed Care – PPO

## 2013-04-17 ENCOUNTER — Encounter: Payer: Self-pay | Admitting: Adult Health

## 2013-04-17 VITALS — BP 116/75 | HR 120 | Temp 98.3°F | Resp 18 | Ht 69.0 in | Wt 241.8 lb

## 2013-04-17 VITALS — BP 95/65 | HR 80 | Temp 98.2°F | Resp 18

## 2013-04-17 DIAGNOSIS — C50319 Malignant neoplasm of lower-inner quadrant of unspecified female breast: Secondary | ICD-10-CM

## 2013-04-17 DIAGNOSIS — I82409 Acute embolism and thrombosis of unspecified deep veins of unspecified lower extremity: Secondary | ICD-10-CM

## 2013-04-17 DIAGNOSIS — D6859 Other primary thrombophilia: Secondary | ICD-10-CM

## 2013-04-17 DIAGNOSIS — C50312 Malignant neoplasm of lower-inner quadrant of left female breast: Secondary | ICD-10-CM

## 2013-04-17 DIAGNOSIS — Z170 Estrogen receptor positive status [ER+]: Secondary | ICD-10-CM

## 2013-04-17 DIAGNOSIS — Z7901 Long term (current) use of anticoagulants: Secondary | ICD-10-CM

## 2013-04-17 DIAGNOSIS — Z5111 Encounter for antineoplastic chemotherapy: Secondary | ICD-10-CM

## 2013-04-17 LAB — COMPREHENSIVE METABOLIC PANEL (CC13)
ALK PHOS: 75 U/L (ref 40–150)
ALT: 35 U/L (ref 0–55)
ANION GAP: 10 meq/L (ref 3–11)
AST: 22 U/L (ref 5–34)
Albumin: 3.6 g/dL (ref 3.5–5.0)
BUN: 9 mg/dL (ref 7.0–26.0)
CO2: 23 meq/L (ref 22–29)
CREATININE: 0.8 mg/dL (ref 0.6–1.1)
Calcium: 9.4 mg/dL (ref 8.4–10.4)
Chloride: 103 mEq/L (ref 98–109)
Glucose: 92 mg/dl (ref 70–140)
Potassium: 4.2 mEq/L (ref 3.5–5.1)
Sodium: 137 mEq/L (ref 136–145)
Total Bilirubin: 0.97 mg/dL (ref 0.20–1.20)
Total Protein: 6.4 g/dL (ref 6.4–8.3)

## 2013-04-17 LAB — CBC WITH DIFFERENTIAL/PLATELET
BASO%: 0.4 % (ref 0.0–2.0)
Basophils Absolute: 0 10*3/uL (ref 0.0–0.1)
EOS%: 0 % (ref 0.0–7.0)
Eosinophils Absolute: 0 10*3/uL (ref 0.0–0.5)
HCT: 24.3 % — ABNORMAL LOW (ref 34.8–46.6)
HGB: 8.1 g/dL — ABNORMAL LOW (ref 11.6–15.9)
LYMPH%: 14.3 % (ref 14.0–49.7)
MCH: 31.9 pg (ref 25.1–34.0)
MCHC: 33.3 g/dL (ref 31.5–36.0)
MCV: 95.7 fL (ref 79.5–101.0)
MONO#: 0.7 10*3/uL (ref 0.1–0.9)
MONO%: 7.4 % (ref 0.0–14.0)
NEUT#: 7.6 10*3/uL — ABNORMAL HIGH (ref 1.5–6.5)
NEUT%: 77.9 % — AB (ref 38.4–76.8)
PLATELETS: 116 10*3/uL — AB (ref 145–400)
RBC: 2.54 10*6/uL — AB (ref 3.70–5.45)
RDW: 19.6 % — ABNORMAL HIGH (ref 11.2–14.5)
WBC: 9.8 10*3/uL (ref 3.9–10.3)
lymph#: 1.4 10*3/uL (ref 0.9–3.3)

## 2013-04-17 MED ORDER — HEPARIN SOD (PORK) LOCK FLUSH 100 UNIT/ML IV SOLN
500.0000 [IU] | Freq: Once | INTRAVENOUS | Status: AC | PRN
  Administered 2013-04-17: 500 [IU]
  Filled 2013-04-17: qty 5

## 2013-04-17 MED ORDER — SODIUM CHLORIDE 0.9 % IJ SOLN
10.0000 mL | INTRAMUSCULAR | Status: DC | PRN
  Administered 2013-04-17: 10 mL
  Filled 2013-04-17: qty 10

## 2013-04-17 MED ORDER — DIPHENHYDRAMINE HCL 50 MG/ML IJ SOLN
50.0000 mg | Freq: Once | INTRAMUSCULAR | Status: AC
  Administered 2013-04-17: 50 mg via INTRAVENOUS

## 2013-04-17 MED ORDER — FAMOTIDINE IN NACL 20-0.9 MG/50ML-% IV SOLN
INTRAVENOUS | Status: AC
  Filled 2013-04-17: qty 50

## 2013-04-17 MED ORDER — SODIUM CHLORIDE 0.9 % IV SOLN
80.0000 mg/m2 | Freq: Once | INTRAVENOUS | Status: AC
  Administered 2013-04-17: 186 mg via INTRAVENOUS
  Filled 2013-04-17: qty 31

## 2013-04-17 MED ORDER — DEXAMETHASONE SODIUM PHOSPHATE 20 MG/5ML IJ SOLN
INTRAMUSCULAR | Status: AC
  Filled 2013-04-17: qty 5

## 2013-04-17 MED ORDER — DEXAMETHASONE SODIUM PHOSPHATE 20 MG/5ML IJ SOLN
20.0000 mg | Freq: Once | INTRAMUSCULAR | Status: AC
  Administered 2013-04-17: 20 mg via INTRAVENOUS

## 2013-04-17 MED ORDER — ONDANSETRON 8 MG/50ML IVPB (CHCC)
8.0000 mg | Freq: Once | INTRAVENOUS | Status: AC
Start: 2013-04-17 — End: 2013-04-17
  Administered 2013-04-17: 8 mg via INTRAVENOUS

## 2013-04-17 MED ORDER — DIPHENHYDRAMINE HCL 50 MG/ML IJ SOLN
INTRAMUSCULAR | Status: AC
  Filled 2013-04-17: qty 1

## 2013-04-17 MED ORDER — SODIUM CHLORIDE 0.9 % IV SOLN
Freq: Once | INTRAVENOUS | Status: AC
  Administered 2013-04-17: 14:00:00 via INTRAVENOUS

## 2013-04-17 MED ORDER — SODIUM CHLORIDE 0.9 % IV SOLN
Freq: Once | INTRAVENOUS | Status: AC
  Administered 2013-04-17: 13:00:00 via INTRAVENOUS

## 2013-04-17 MED ORDER — ONDANSETRON 8 MG/NS 50 ML IVPB
INTRAVENOUS | Status: AC
  Filled 2013-04-17: qty 8

## 2013-04-17 MED ORDER — FAMOTIDINE IN NACL 20-0.9 MG/50ML-% IV SOLN
20.0000 mg | Freq: Once | INTRAVENOUS | Status: AC
  Administered 2013-04-17: 20 mg via INTRAVENOUS

## 2013-04-17 MED ORDER — GOSERELIN ACETATE 3.6 MG ~~LOC~~ IMPL
3.6000 mg | DRUG_IMPLANT | Freq: Once | SUBCUTANEOUS | Status: AC
  Administered 2013-04-17: 3.6 mg via SUBCUTANEOUS
  Filled 2013-04-17: qty 3.6

## 2013-04-17 NOTE — Patient Instructions (Signed)
Goserelin injection What is this medicine? GOSERELIN (GOE se rel in) is similar to a hormone found in the body. It lowers the amount of sex hormones that the body makes. Men will have lower testosterone levels and women will have lower estrogen levels while taking this medicine. In men, this medicine is used to treat prostate cancer; the injection is either given once per month or once every 12 weeks. A once per month injection (only) is used to treat women with endometriosis, dysfunctional uterine bleeding, or advanced breast cancer. This medicine may be used for other purposes; ask your health care provider or pharmacist if you have questions. COMMON BRAND NAME(S): Zoladex What should I tell my health care provider before I take this medicine? They need to know if you have any of these conditions (some only apply to women): -diabetes -heart disease or previous heart attack -high blood pressure -high cholesterol -kidney disease -osteoporosis or low bone density -problems passing urine -spinal cord injury -stroke -tobacco smoker -an unusual or allergic reaction to goserelin, hormone therapy, other medicines, foods, dyes, or preservatives -pregnant or trying to get pregnant -breast-feeding How should I use this medicine? This medicine is for injection under the skin. It is given by a health care professional in a hospital or clinic setting. Men receive this injection once every 4 weeks or once every 12 weeks. Women will only receive the once every 4 weeks injection. Talk to your pediatrician regarding the use of this medicine in children. Special care may be needed. Overdosage: If you think you have taken too much of this medicine contact a poison control center or emergency room at once. NOTE: This medicine is only for you. Do not share this medicine with others. What if I miss a dose? It is important not to miss your dose. Call your doctor or health care professional if you are unable to  keep an appointment. What may interact with this medicine? -female hormones like estrogen -herbal or dietary supplements like black cohosh, chasteberry, or DHEA -female hormones like testosterone -prasterone This list may not describe all possible interactions. Give your health care provider a list of all the medicines, herbs, non-prescription drugs, or dietary supplements you use. Also tell them if you smoke, drink alcohol, or use illegal drugs. Some items may interact with your medicine. What should I watch for while using this medicine? Visit your doctor or health care professional for regular checks on your progress. Your symptoms may appear to get worse during the first weeks of this therapy. Tell your doctor or healthcare professional if your symptoms do not start to get better or if they get worse after this time. Your bones may get weaker if you take this medicine for a long time. If you smoke or frequently drink alcohol you may increase your risk of bone loss. A family history of osteoporosis, chronic use of drugs for seizures (convulsions), or corticosteroids can also increase your risk of bone loss. Talk to your doctor about how to keep your bones strong. This medicine should stop regular monthly menstration in women. Tell your doctor if you continue to menstrate. Women should not become pregnant while taking this medicine or for 12 weeks after stopping this medicine. Women should inform their doctor if they wish to become pregnant or think they might be pregnant. There is a potential for serious side effects to an unborn child. Talk to your health care professional or pharmacist for more information. Do not breast-feed an infant while taking   this medicine. Men should inform their doctors if they wish to father a child. This medicine may lower sperm counts. Talk to your health care professional or pharmacist for more information. What side effects may I notice from receiving this  medicine? Side effects that you should report to your doctor or health care professional as soon as possible: -allergic reactions like skin rash, itching or hives, swelling of the face, lips, or tongue -bone pain -breathing problems -changes in vision -chest pain -feeling faint or lightheaded, falls -fever, chills -pain, swelling, warmth in the leg -pain, tingling, numbness in the hands or feet -swelling of the ankles, feet, hands -trouble passing urine or change in the amount of urine -unusually high or low blood pressure -unusually weak or tired Side effects that usually do not require medical attention (report to your doctor or health care professional if they continue or are bothersome): -change in sex drive or performance -changes in breast size in both males and females -changes in emotions or moods -headache -hot flashes -irritation at site where injected -loss of appetite -skin problems like acne, dry skin -vaginal dryness This list may not describe all possible side effects. Call your doctor for medical advice about side effects. You may report side effects to FDA at 1-800-FDA-1088. Where should I keep my medicine? This drug is given in a hospital or clinic and will not be stored at home. NOTE: This sheet is a summary. It may not cover all possible information. If you have questions about this medicine, talk to your doctor, pharmacist, or health care provider.  2014, [employer]vier/Gold Standard. (2008-07-20 13:28:29)  

## 2013-04-17 NOTE — Progress Notes (Signed)
After taxol infusion began, patient's blood pressure started dropping slowly. Patient had no other symptoms except "feeling a little tired from the IV benadryl." Charlestine Massed, NP notified. Received order to give 1 liter normal saline along with taxol infusion. Continued to monitor patient closely. Taxol rate calculated from milligrams in bag. Due to low blood pressure, slower rate maintained through the rest of the infusion. First time taxol infusion ran over 2 hours and 12 mins. Patient tolerated well without any complications. Patient informed by Mendel Ryder to hold blood pressure medication day of chemo. Patient agreeable to this. Cindi Carbon, RN

## 2013-04-17 NOTE — Patient Instructions (Signed)
[facility] Discharge Instructions for Patients Receiving Chemotherapy  Today you received the following chemotherapy agents: taxol  To help prevent nausea and vomiting after your treatment, we encourage you to take your nausea medication as prescribed.    If you develop nausea and vomiting that is not controlled by your nausea medication, call the clinic.   BELOW ARE SYMPTOMS THAT SHOULD BE REPORTED IMMEDIATELY:  *FEVER GREATER THAN 100.5 F  *CHILLS WITH OR WITHOUT FEVER  NAUSEA AND VOMITING THAT IS NOT CONTROLLED WITH YOUR NAUSEA MEDICATION  *UNUSUAL SHORTNESS OF BREATH  *UNUSUAL BRUISING OR BLEEDING  TENDERNESS IN MOUTH AND THROAT WITH OR WITHOUT PRESENCE OF ULCERS  *URINARY PROBLEMS  *BOWEL PROBLEMS  UNUSUAL RASH Items with * indicate a potential emergency and should be followed up as soon as possible.  Feel free to call the clinic you have any questions or concerns. The clinic phone number is [phone].   Paclitaxel injection - Taxol What is this medicine? PACLITAXEL (PAK li TAX el) is a chemotherapy drug. It targets fast dividing cells, like cancer cells, and causes these cells to die. This medicine is used to treat ovarian cancer, breast cancer, and other cancers. This medicine may be used for other purposes; ask your health care provider or pharmacist if you have questions. COMMON BRAND NAME(S): Onxol , Taxol What should I tell my health care provider before I take this medicine? They need to know if you have any of these conditions: -blood disorders -irregular heartbeat -infection (especially a virus infection such as chickenpox, cold sores, or herpes) -liver disease -previous or ongoing radiation therapy -an unusual or allergic reaction to paclitaxel, alcohol, polyoxyethylated castor oil, other chemotherapy agents, other medicines, foods, dyes, or preservatives -pregnant or trying to get pregnant -breast-feeding How should I use this  medicine? This drug is given as an infusion into a vein. It is administered in a hospital or clinic by a specially trained health care professional. Talk to your pediatrician regarding the use of this medicine in children. Special care may be needed. Overdosage: If you think you have taken too much of this medicine contact a poison control center or emergency room at once. NOTE: This medicine is only for you. Do not share this medicine with others. What if I miss a dose? It is important not to miss your dose. Call your doctor or health care professional if you are unable to keep an appointment. What may interact with this medicine? Do not take this medicine with any of the following medications: -disulfiram -metronidazole This medicine may also interact with the following medications: -cyclosporine -diazepam -ketoconazole -medicines to increase blood counts like filgrastim, pegfilgrastim, sargramostim -other chemotherapy drugs like cisplatin, doxorubicin, epirubicin, etoposide, teniposide, vincristine -quinidine -testosterone -vaccines -verapamil Talk to your doctor or health care professional before taking any of these medicines: -acetaminophen -aspirin -ibuprofen -ketoprofen -naproxen This list may not describe all possible interactions. Give your health care provider a list of all the medicines, herbs, non-prescription drugs, or dietary supplements you use. Also tell them if you smoke, drink alcohol, or use illegal drugs. Some items may interact with your medicine. What should I watch for while using this medicine? Your condition will be monitored carefully while you are receiving this medicine. You will need important blood work done while you are taking this medicine. This drug may make you feel generally unwell. This is not uncommon, as chemotherapy can affect healthy cells as well as cancer cells. Report any side effects. Continue  your course of treatment even though you feel ill  unless your doctor tells you to stop. In some cases, you may be given additional medicines to help with side effects. Follow all directions for their use. Call your doctor or health care professional for advice if you get a fever, chills or sore throat, or other symptoms of a cold or flu. Do not treat yourself. This drug decreases your body's ability to fight infections. Try to avoid being around people who are sick. This medicine may increase your risk to bruise or bleed. Call your doctor or health care professional if you notice any unusual bleeding. Be careful brushing and flossing your teeth or using a toothpick because you may get an infection or bleed more easily. If you have any dental work done, tell your dentist you are receiving this medicine. Avoid taking products that contain aspirin, acetaminophen, ibuprofen, naproxen, or ketoprofen unless instructed by your doctor. These medicines may hide a fever. Do not become pregnant while taking this medicine. Women should inform their doctor if they wish to become pregnant or think they might be pregnant. There is a potential for serious side effects to an unborn child. Talk to your health care professional or pharmacist for more information. Do not breast-feed an infant while taking this medicine. Men are advised not to father a child while receiving this medicine. What side effects may I notice from receiving this medicine? Side effects that you should report to your doctor or health care professional as soon as possible: -allergic reactions like skin rash, itching or hives, swelling of the face, lips, or tongue -low blood counts - This drug may decrease the number of white blood cells, red blood cells and platelets. You may be at increased risk for infections and bleeding. -signs of infection - fever or chills, cough, sore throat, pain or difficulty passing urine -signs of decreased platelets or bleeding - bruising, pinpoint red spots on the skin,  black, tarry stools, nosebleeds -signs of decreased red blood cells - unusually weak or tired, fainting spells, lightheadedness -breathing problems -chest pain -high or low blood pressure -mouth sores -nausea and vomiting -pain, swelling, redness or irritation at the injection site -pain, tingling, numbness in the hands or feet -slow or irregular heartbeat -swelling of the ankle, feet, hands Side effects that usually do not require medical attention (report to your doctor or health care professional if they continue or are bothersome): -bone pain -complete hair loss including hair on your head, underarms, pubic hair, eyebrows, and eyelashes -changes in the color of fingernails -diarrhea -loosening of the fingernails -loss of appetite -muscle or joint pain -red flush to skin -sweating This list may not describe all possible side effects. Call your doctor for medical advice about side effects. You may report side effects to FDA at 1-800-FDA-1088. Where should I keep my medicine? This drug is given in a hospital or clinic and will not be stored at home. NOTE: This sheet is a summary. It may not cover all possible information. If you have questions about this medicine, talk to your doctor, pharmacist, or health care provider.  2014, [employer]vier/Gold Standard. (2012-04-28 16:41:21)

## 2013-04-17 NOTE — Progress Notes (Signed)
[facility]  Telephone:([phone] [phone]) [phone]  OFFICE PROGRESS NOTE  ID: [name]   DOB: April 28, 1975  MR#: [id number]  JYN#:[id number]   PCP: Lanetta Inch L. Sherre Lain SU:  Jovita Kussmaul, MD  RAD ONC:  Arloa Koh, MD   DIAGNOSIS: [name] is a 38 y.o. 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  She was seen in the multidisciplinary breast clinic on 10/23/2012 for discussion of treatment options.    STAGE:  Cancer of lower-inner quadrant of female breast  Primary site: Breast (Left)  Staging method: AJCC 7th Edition  Clinical: Stage IA (T1c, N0, cM0)  Summary: Stage IA (T1c, N0, cM0)   PRIOR THERAPY: #1  A screening mammogram in [place], [place] on 09/23/2012 she was felt to have a new mass within the left breast. An ultrasound on 10/06/2012 showed a 1.6 x 1.3 x 1.3 cm mass at the 8 o'clock position 7 cm from the nipple. At the 12 o'clock position behind and nipple was a circumscribed oval shaped 2 x 1.8 x 1.2 cm lesion perhaps representing a fibroadenoma. 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 The carcinoma was grade 2-3, estrogen receptor 80% positive, progesterone receptor 80% positive, Ki-67 25%, and HER-2/neu negative. Bilateral breast MRI on 10/22/2012 showed biopsy proven carcinoma in the left lower inner quadrant identified and measures approximately 1.9 x 1.9 x 1.8 cm.  Well-defined nodule in the right upper outer quadrant posteriorly consistent with a fibroadenoma. Probable liver cysts.There were multiple scattered nonspecific foci in each breast. No other suspicious abnormality was identified. No abnormal appearing lymph nodes (Clinical stage I, T1 N0).   #2 Status post left breast lumpectomy with left axillary sentinel lymph node biopsy on 12/10/2012 for a stage IIB, pT2,  pN50mi, MX, 2.3 cm invasive ductal carcinoma, grade 3, estrogen receptor 80% positive, progesterone receptor 80% positive, Ki-67 25%, HER-2/neu negative, with 1/2 left axillary sentinel lymph nodes positive for microscopic disease.   #3 Genetic testing performed in [place], [place] with self reported by the patient as BRCA1 and BRCA2 negative.  #4 Adjuvant chemotherapy consisting of FEC (5-FU/epirubicin/Cytoxan) began on 01/23/2013. She completed 6 cycles of FEC on 04/03/13.  She was started on weekly Taxol beginning 04/17/13.  A total of 12 weekly cycles is planned.    #5  Right lower extremity DVT diagnosed on 03/16/2013, patient was placed on Xarelto $RemoveBe'15mg'uVhXXfOvJ$  BID.  Her dose was changed to $RemoveBe'20mg'bURaUdudb$  daily on 04/10/13.  CURRENT THERAPY:  Weekly taxol, week 1/12  INTERVAL HISTORY: [name] is a  38 y.o. female who returns today for evaluation prior to receiving her first weekly Taxol.  She is doing well today.  She had a low grade fever earlier this week, however she is doing much better today.  She continues to take Xarelto $RemoveBefo'20mg'QOAdCAkNxvS$  daily and is tolerating this well.  The ache in her leg is very much improved.  She denies any fevers, chills, nausea, vomiting, constipation, diarrhea, numbness, skin changes, shortness of breath, DOE, orthopnea, or any other concerns.  A 10 point ROS is otherwise negative.   MEDICAL HISTORY: Past Medical History  Diagnosis Date  . Urinary incontinence     occasional  . Family history of anesthesia complication     pt's mother has hx. of post-op N/V  . Hypertension     under control with med., has  been on med. since 09/2012  . Anxiety   . Breast cancer     left    ALLERGIES:   Allergies  Allergen Reactions  . Amoxicillin Other (See Comments)    States "it does not work"    MEDICATIONS:  Current Outpatient Prescriptions  Medication Sig Dispense Refill  . ALPRAZolam (XANAX) 0.25 MG tablet Take 1 tablet (0.25 mg total) by mouth at bedtime as needed for sleep.   30 tablet  2  . HYDROcodone-acetaminophen (NORCO/VICODIN) 5-325 MG per tablet Take 1-2 tablets by mouth every 4 (four) hours as needed.  60 tablet  0  . lidocaine-prilocaine (EMLA) cream Apply topically as needed.  30 g  0  . lisinopril (PRINIVIL,ZESTRIL) 10 MG tablet Take 10 mg by mouth daily.      Marland Kitchen LORazepam (ATIVAN) 0.5 MG tablet Take 1 tablet (0.5 mg total) by mouth every 6 (six) hours as needed (Nausea or vomiting).  30 tablet  0  . omeprazole (PRILOSEC) 20 MG capsule TAKE ONE CAPSULE BY MOUTH EVERY DAY  30 capsule  2  . Rivaroxaban (XARELTO) 20 MG TABS tablet Take 1 tablet (20 mg total) by mouth daily with supper.  30 tablet  6  . dexamethasone (DECADRON) 4 MG tablet Take 2 tablets by mouth once a day on the day after chemotherapy and then take 2 tablets two times a day for 2 days. Take with food.  30 tablet  1  . ondansetron (ZOFRAN) 8 MG tablet Take 1 tablet (8 mg total) by mouth 2 (two) times daily. Take two times a day starting the day after chemo for 2 days. Then take two times a day as needed for nausea or vomiting.  30 tablet  1  . prochlorperazine (COMPAZINE) 10 MG tablet Take 1 tablet (10 mg total) by mouth every 6 (six) hours as needed (Nausea or vomiting).  30 tablet  1   No current facility-administered medications for this visit.    SURGICAL HISTORY:  Past Surgical History  Procedure Laterality Date  . Cesarean section  1999  . Breast lumpectomy with needle localization and axillary sentinel lymph node bx Left 12/10/2012    Procedure: BREAST LUMPECTOMY WITH NEEDLE LOCALIZATION AND AXILLARY SENTINEL LYMPH NODE BX;  Surgeon: Merrie Roof, MD;  Location: [facility];  Service: General;  Laterality: Left;  Needle loc BCG 7:30  nuc med 9:30    . Tonsillectomy and adenoidectomy      as a teenager  . Cholecystectomy  1999  . Open reduction internal fixation (orif) tibia/fibula fracture Right   . Portacath placement Right 01/09/2013    Procedure: INSERTION PORT-A-CATH;  Surgeon:  Merrie Roof, MD;  Location: [facility];  Service: General;  Laterality: Right;    REVIEW OF SYSTEMS:  A 10 point review of systems was completed and is negative except as noted above.    PHYSICAL EXAMINATION: Blood pressure 116/75, pulse 120, temperature 98.3 F (36.8 C), temperature source Oral, resp. rate 18, height $RemoveBe'5\' 9"'vhusdjegF$  (1.753 m), weight 241 lb 12.8 oz (109.68 kg). Body mass index is 35.69 kg/(m^2). GENERAL: Patient is a well appearing female in no acute distress HEENT:  Sclerae anicteric.  Oropharynx clear and moist. No ulcerations or evidence of oropharyngeal candidiasis. Neck is supple.  NODES:  No cervical, supraclavicular, or axillary lymphadenopathy palpated.  BREAST EXAM:  Deferred. LUNGS:  Clear to auscultation bilaterally.  No wheezes or rhonchi. HEART:  Regular rate and rhythm. No murmur appreciated.  ABDOMEN:  Soft, nontender.  Positive, normoactive bowel sounds. No organomegaly palpated. MSK:  No focal spinal tenderness to palpation. Full range of motion bilaterally in the upper extremities. EXTREMITIES:  No peripheral edema.  2+ pedal pulses SKIN:  Clear with no obvious rashes or skin changes. No nail dyscrasia. NEURO:  Nonfocal. Well oriented.  Appropriate affect. ECOG PERFORMANCE STATUS: 0 - Asymptomatic   LABORATORY DATA: Lab Results  Component Value Date   WBC 9.8 04/17/2013   HGB 8.1* 04/17/2013   HCT 24.3* 04/17/2013   MCV 95.7 04/17/2013   PLT 116* 04/17/2013      Chemistry      Component Value Date/Time   NA 137 04/17/2013 1108   NA 136 01/23/2013 1413   K 4.2 04/17/2013 1108   K 3.7 01/23/2013 1413   CL 102 01/23/2013 1413   CO2 23 04/17/2013 1108   CO2 27 01/23/2013 1413   BUN 9.0 04/17/2013 1108   BUN 12 01/23/2013 1413   CREATININE 0.8 04/17/2013 1108   CREATININE 0.68 01/23/2013 1413      Component Value Date/Time   CALCIUM 9.4 04/17/2013 1108   CALCIUM 9.3 01/23/2013 1413   ALKPHOS 75 04/17/2013 1108   ALKPHOS 74 01/23/2013 1413   AST  22 04/17/2013 1108   AST 27 01/23/2013 1413   ALT 35 04/17/2013 1108   ALT 30 01/23/2013 1413   BILITOT 0.97 04/17/2013 1108   BILITOT 0.5 01/23/2013 1413      RADIOGRAPHIC STUDIES: No results found.   ASSESSMENT:   [name] is a 38 y.o. [place], [place] female with:  #1 Stage II, invasive ductal carcinoma of the left breast, grade 3, estrogen receptor/progesterone receptor positive, Ki-67 25%, HER-2/neu negative. Status post left breast lumpectomy with left axillary sentinel lymph node biopsy on 12/10/2012 with 1 of 2 sentinel nodes revealing microscopic disease. The patient elected not to receive axillary lymph node dissection and will undergo radiation therapy after the completion of chemotherapy.    #2 status post adjuvant chemotherapy on 01/23/2013 consisting of FEC (5-FU/Epirubicin/Cytoxan) with Neulasta support given every 2 weeks for a total of 6 cycles.  This will be followed by adjuvant chemotherapy consisting of Taxol on a weekly basis for 12 weeks starting 04/17/13. Port-A-Cath placement on 01/09/2013, 2D echocardiogram with LVEF of 50% - 55%  and chemotherapy class on 01/12/2013. She underwent a repeat echocardiogram on 03/05/13 that demonstrated an improvement in her LVEF to 55-60%, she was evaluated by Dr. Haroldine Laws on 03/09/13 who cleared her to continue with Epirubicin therapy, and will f/u with her in 3 months.    #3 Right lower extremity DVT on 03/16/13, on Xarelto.  A hypercoagulable panel was drawn and demonstrated a positive lupus anticoagulant.  We will retest this in 6-8 weeks.   PLAN:  #1 Patient is doing well today.  Her CBC demonstrates an anemia with a hemoglobin of 8.1.  She is not symptomatic.  Due to this, she will proceed with her first week of Taxol today, however, we reviewed symptoms of anemia, and she will call us should she feel any worse.    #2  She and I again discussed the Taxol today, risks, benefits, common adverse effects.    #3  She will return in  one week for labs, evaluation, and week 2 of Taxol chemotherapy.  We will continue to monitor her closely for any signs of neuropathy.    All questions answered. Ms. Tarazon was encouraged to contact us in the interim with  any questions, concerns, or problems  I spent 25 minutes counseling the patient face to face.  The total time spent in the appointment was 30 minutes.  Minette Headland, [place] [phone] 04/18/2013, 7:17 AM

## 2013-04-17 NOTE — Progress Notes (Signed)
Per Ria Comment , NP it is okay to treat pt today with chemotherapy and today's CBC/diff results.

## 2013-04-24 ENCOUNTER — Other Ambulatory Visit (HOSPITAL_BASED_OUTPATIENT_CLINIC_OR_DEPARTMENT_OTHER): Payer: BC Managed Care – PPO

## 2013-04-24 ENCOUNTER — Ambulatory Visit (HOSPITAL_BASED_OUTPATIENT_CLINIC_OR_DEPARTMENT_OTHER): Payer: BC Managed Care – PPO | Admitting: Hematology and Oncology

## 2013-04-24 ENCOUNTER — Ambulatory Visit (HOSPITAL_BASED_OUTPATIENT_CLINIC_OR_DEPARTMENT_OTHER): Payer: BC Managed Care – PPO

## 2013-04-24 VITALS — BP 120/69 | HR 121 | Temp 98.5°F | Resp 19 | Ht 69.0 in | Wt 243.2 lb

## 2013-04-24 VITALS — BP 116/70 | HR 90 | Temp 98.0°F | Resp 18

## 2013-04-24 DIAGNOSIS — C50319 Malignant neoplasm of lower-inner quadrant of unspecified female breast: Secondary | ICD-10-CM

## 2013-04-24 DIAGNOSIS — Z5111 Encounter for antineoplastic chemotherapy: Secondary | ICD-10-CM

## 2013-04-24 DIAGNOSIS — Z170 Estrogen receptor positive status [ER+]: Secondary | ICD-10-CM

## 2013-04-24 DIAGNOSIS — I82409 Acute embolism and thrombosis of unspecified deep veins of unspecified lower extremity: Secondary | ICD-10-CM

## 2013-04-24 DIAGNOSIS — C50312 Malignant neoplasm of lower-inner quadrant of left female breast: Secondary | ICD-10-CM

## 2013-04-24 LAB — CBC WITH DIFFERENTIAL/PLATELET
BASO%: 0.7 % (ref 0.0–2.0)
Basophils Absolute: 0.1 10*3/uL (ref 0.0–0.1)
EOS%: 0.2 % (ref 0.0–7.0)
Eosinophils Absolute: 0 10*3/uL (ref 0.0–0.5)
HCT: 25.1 % — ABNORMAL LOW (ref 34.8–46.6)
HGB: 8.6 g/dL — ABNORMAL LOW (ref 11.6–15.9)
LYMPH#: 1.2 10*3/uL (ref 0.9–3.3)
LYMPH%: 8.9 % — ABNORMAL LOW (ref 14.0–49.7)
MCH: 32.3 pg (ref 25.1–34.0)
MCHC: 34.3 g/dL (ref 31.5–36.0)
MCV: 94.4 fL (ref 79.5–101.0)
MONO#: 0.8 10*3/uL (ref 0.1–0.9)
MONO%: 5.9 % (ref 0.0–14.0)
NEUT#: 11.2 10*3/uL — ABNORMAL HIGH (ref 1.5–6.5)
NEUT%: 84.3 % — AB (ref 38.4–76.8)
NRBC: 2 % — AB (ref 0–0)
Platelets: 307 10*3/uL (ref 145–400)
RBC: 2.66 10*6/uL — AB (ref 3.70–5.45)
RDW: 19.8 % — ABNORMAL HIGH (ref 11.2–14.5)
WBC: 13.3 10*3/uL — ABNORMAL HIGH (ref 3.9–10.3)

## 2013-04-24 LAB — COMPREHENSIVE METABOLIC PANEL (CC13)
ALBUMIN: 3.4 g/dL — AB (ref 3.5–5.0)
ALK PHOS: 53 U/L (ref 40–150)
ALT: 24 U/L (ref 0–55)
AST: 17 U/L (ref 5–34)
Anion Gap: 9 mEq/L (ref 3–11)
BUN: 17.3 mg/dL (ref 7.0–26.0)
CO2: 25 mEq/L (ref 22–29)
CREATININE: 0.8 mg/dL (ref 0.6–1.1)
Calcium: 9.4 mg/dL (ref 8.4–10.4)
Chloride: 102 mEq/L (ref 98–109)
Glucose: 98 mg/dl (ref 70–140)
POTASSIUM: 4.2 meq/L (ref 3.5–5.1)
Sodium: 136 mEq/L (ref 136–145)
Total Bilirubin: 1.31 mg/dL — ABNORMAL HIGH (ref 0.20–1.20)
Total Protein: 5.8 g/dL — ABNORMAL LOW (ref 6.4–8.3)

## 2013-04-24 MED ORDER — HEPARIN SOD (PORK) LOCK FLUSH 100 UNIT/ML IV SOLN
500.0000 [IU] | Freq: Once | INTRAVENOUS | Status: AC | PRN
  Administered 2013-04-24: 500 [IU]
  Filled 2013-04-24: qty 5

## 2013-04-24 MED ORDER — DEXAMETHASONE SODIUM PHOSPHATE 20 MG/5ML IJ SOLN
20.0000 mg | Freq: Once | INTRAMUSCULAR | Status: AC
  Administered 2013-04-24: 20 mg via INTRAVENOUS

## 2013-04-24 MED ORDER — ONDANSETRON 8 MG/NS 50 ML IVPB
INTRAVENOUS | Status: AC
  Filled 2013-04-24: qty 8

## 2013-04-24 MED ORDER — FAMOTIDINE IN NACL 20-0.9 MG/50ML-% IV SOLN
20.0000 mg | Freq: Once | INTRAVENOUS | Status: AC
  Administered 2013-04-24: 20 mg via INTRAVENOUS

## 2013-04-24 MED ORDER — FAMOTIDINE IN NACL 20-0.9 MG/50ML-% IV SOLN
INTRAVENOUS | Status: AC
  Filled 2013-04-24: qty 50

## 2013-04-24 MED ORDER — SODIUM CHLORIDE 0.9 % IJ SOLN
10.0000 mL | INTRAMUSCULAR | Status: DC | PRN
  Administered 2013-04-24: 10 mL
  Filled 2013-04-24: qty 10

## 2013-04-24 MED ORDER — DEXAMETHASONE SODIUM PHOSPHATE 20 MG/5ML IJ SOLN
INTRAMUSCULAR | Status: AC
  Filled 2013-04-24: qty 5

## 2013-04-24 MED ORDER — DIPHENHYDRAMINE HCL 50 MG/ML IJ SOLN
INTRAMUSCULAR | Status: AC
  Filled 2013-04-24: qty 1

## 2013-04-24 MED ORDER — ONDANSETRON 8 MG/50ML IVPB (CHCC)
8.0000 mg | Freq: Once | INTRAVENOUS | Status: AC
  Administered 2013-04-24: 8 mg via INTRAVENOUS

## 2013-04-24 MED ORDER — DIPHENHYDRAMINE HCL 50 MG/ML IJ SOLN
50.0000 mg | Freq: Once | INTRAMUSCULAR | Status: AC
  Administered 2013-04-24: 50 mg via INTRAVENOUS

## 2013-04-24 MED ORDER — SODIUM CHLORIDE 0.9 % IV SOLN
Freq: Once | INTRAVENOUS | Status: AC
  Administered 2013-04-24: 12:00:00 via INTRAVENOUS

## 2013-04-24 MED ORDER — SODIUM CHLORIDE 0.9 % IV SOLN
80.0000 mg/m2 | Freq: Once | INTRAVENOUS | Status: AC
  Administered 2013-04-24: 186 mg via INTRAVENOUS
  Filled 2013-04-24: qty 31

## 2013-04-24 NOTE — Progress Notes (Signed)
Pt reports she did not take BP meds today or yesterday.   Per Lisabeth Register, titrate Taxol as if for 1st dose this time.

## 2013-04-24 NOTE — Progress Notes (Signed)
[facility]  Telephone:(336) [phone] Fax:(336) [phone]  OFFICE PROGRESS NOTE  ID: [name]   DOB: 05-Mar-1976  MR#: [id number]  HEN#:[id number]   PCP: Lanetta Inch L. Sherre Lain SU:  Jovita Kussmaul, MD  RAD ONC:  Arloa Koh, MD   DIAGNOSIS: [name] is a 38 y.o. 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  She was seen in the multidisciplinary breast clinic on 10/23/2012 for discussion of treatment options.    STAGE:  Cancer of lower-inner quadrant of female breast  Primary site: Breast (Left)  Staging method: AJCC 7th Edition  Clinical: Stage IA (T1c, N0, cM0)  Summary: Stage IA (T1c, N0, cM0)   PRIOR THERAPY: #1  A screening mammogram in [place], [place] on 09/23/2012 she was felt to have a new mass within the left breast. An ultrasound on 10/06/2012 showed a 1.6 x 1.3 x 1.3 cm mass at the 8 o'clock position 7 cm from the nipple. At the 12 o'clock position behind and nipple was a circumscribed oval shaped 2 x 1.8 x 1.2 cm lesion perhaps representing a fibroadenoma. 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 The carcinoma was grade 2-3, estrogen receptor 80% positive, progesterone receptor 80% positive, Ki-67 25%, and HER-2/neu negative. Bilateral breast MRI on 10/22/2012 showed biopsy proven carcinoma in the left lower inner quadrant identified and measures approximately 1.9 x 1.9 x 1.8 cm.  Well-defined nodule in the right upper outer quadrant posteriorly consistent with a fibroadenoma. Probable liver cysts.There were multiple scattered nonspecific foci in each breast. No other suspicious abnormality was identified. No abnormal appearing lymph nodes (Clinical stage I, T1 N0).   #2 Status post left breast lumpectomy with left axillary sentinel lymph node biopsy on 12/10/2012 for a stage IIB, pT2,  pN44m, MX, 2.3 cm invasive ductal carcinoma, grade 3, estrogen receptor 80% positive, progesterone receptor 80% positive, Ki-67 25%, HER-2/neu negative, with 1/2 left axillary sentinel lymph nodes positive for microscopic disease.   #[place]1 [place] BRCA2 negative.  #4 Adjuvant chemotherapy consisting of FEC (5-FU/epirubicin/Cytoxan) began on 01/23/2013. She completed 6 cycles of FEC on 04/03/13.  She was started on weekly Taxol beginning 04/17/13.  A total of 12 weekly cycles is planned.    #5  Right lower extremity DVT diagnosed on 03/16/2013, patient was placed on Xarelto 121mBID.  Her dose was changed to 2035maily on 04/10/13.  CURRENT THERAPY:  Weekly taxol, week 1/12  INTERVAL HISTORY: [name] a  [age]o. female who returns today for second week of Taxol therapy today. She tolerated her first week of Taxol very well with minimal nausea and her nausea is controlled with the medications. She denies tingling or numbness. She is able to eat okay but she does have taste change. Denies any fevers, chills,  constipation, diarrhea, skin changes, shortness of breath, DOE, orthopnea, or any other concerns.    MEDICAL HISTORY: Past Medical History  Diagnosis Date  . Urinary incontinence     occasional  . Family history of anesthesia complication     pt's mother has hx. of post-op N/V  . Hypertension     under control with med., has been on med. since 09/2012  . Anxiety   . Breast cancer     left    ALLERGIES:  Allergies  Allergen Reactions  . Amoxicillin Other (See Comments)    States "it does not work"    MEDICATIONS:  Current Outpatient Prescriptions  Medication Sig Dispense Refill  . ALPRAZolam (XANAX) 0.25 MG tablet Take 1 tablet (0.25 mg total) by mouth at bedtime as needed for sleep.  30 tablet  2  . dexamethasone (DECADRON) 4 MG tablet Take 2 tablets by mouth once a day on the day after  chemotherapy and then take 2 tablets two times a day for 2 days. Take with food.  30 tablet  1  . HYDROcodone-acetaminophen (NORCO/VICODIN) 5-325 MG per tablet Take 1-2 tablets by mouth every 4 (four) hours as needed.  60 tablet  0  . lidocaine-prilocaine (EMLA) cream Apply topically as needed.  30 g  0  . lisinopril (PRINIVIL,ZESTRIL) 10 MG tablet Take 10 mg by mouth daily.      Marland Kitchen LORazepam (ATIVAN) 0.5 MG tablet Take 1 tablet (0.5 mg total) by mouth every 6 (six) hours as needed (Nausea or vomiting).  30 tablet  0  . omeprazole (PRILOSEC) 20 MG capsule TAKE ONE CAPSULE BY MOUTH EVERY DAY  30 capsule  2  . ondansetron (ZOFRAN) 8 MG tablet Take 1 tablet (8 mg total) by mouth 2 (two) times daily. Take two times a day starting the day after chemo for 2 days. Then take two times a day as needed for nausea or vomiting.  30 tablet  1  . prochlorperazine (COMPAZINE) 10 MG tablet Take 1 tablet (10 mg total) by mouth every 6 (six) hours as needed (Nausea or vomiting).  30 tablet  1  . Rivaroxaban (XARELTO) 20 MG TABS tablet Take 1 tablet (20 mg total) by mouth daily with supper.  30 tablet  6   No current facility-administered medications for this visit.   Facility-Administered Medications Ordered in Other Visits  Medication Dose Route Frequency Provider Last Rate Last Dose  . sodium chloride 0.9 % injection 10 mL  10 mL Intracatheter PRN Deatra Robinson, MD   10 mL at 04/24/13 1425    SURGICAL HISTORY:  Past Surgical History  Procedure Laterality Date  . Cesarean section  1999  . Breast lumpectomy with needle localization and axillary sentinel lymph node bx Left 12/10/2012    Procedure: BREAST LUMPECTOMY WITH NEEDLE LOCALIZATION AND AXILLARY SENTINEL LYMPH NODE BX;  Surgeon: Merrie Roof, MD;  Location: [facility];  Service: General;  Laterality: Left;  Needle loc BCG 7:30  nuc med 9:30    . Tonsillectomy and adenoidectomy      as a teenager  . Cholecystectomy  1999  . Open reduction internal  fixation (orif) tibia/fibula fracture Right   . Portacath placement Right 01/09/2013    Procedure: INSERTION PORT-A-CATH;  Surgeon: Merrie Roof, MD;  Location: [facility];  Service: General;  Laterality: Right;    REVIEW OF SYSTEMS:  A 10 point review of systems was completed and is negative except as noted above.    PHYSICAL EXAMINATION: Blood pressure 120/69, pulse 121, temperature 98.5 F (36.9 C), temperature source Oral, resp. rate 19, height _0  (1.753 m), weight 243 lb 3.2 oz (110.315 kg). Body mass index is 35.9 kg/(m^2). GENERAL: Patient is a well appearing female in no acute distress HEENT:  Sclerae anicteric.  Oropharynx clear and moist. No ulcerations or evidence of oropharyngeal candidiasis. Neck is supple.  NODES:  No cervical, supraclavicular, or axillary lymphadenopathy palpated.  BREAST EXAM: Status post left  breast lumpectomy scar noted no masses appreciated in both the breasts. No bilateral axillary lymphadenopathy was noted LUNGS:  Clear to auscultation bilaterally.  No wheezes or rhonchi. HEART:  Regular rate and rhythm. No murmur appreciated. ABDOMEN:  Soft, nontender.  Positive, normoactive bowel sounds. No organomegaly palpated. MSK:  No focal spinal tenderness to palpation. Full range of motion bilaterally in the upper extremities. EXTREMITIES:  No peripheral edema.  2+ pedal pulses SKIN:  Clear with no obvious rashes or skin changes. No nail dyscrasia. NEURO:  Nonfocal. Well oriented.  Appropriate affect. ECOG PERFORMANCE STATUS: 0 - Asymptomatic   LABORATORY DATA: Lab Results  Component Value Date   WBC 13.3* 04/24/2013   HGB 8.6* 04/24/2013   HCT 25.1* 04/24/2013   MCV 94.4 04/24/2013   PLT 307 04/24/2013      Chemistry      Component Value Date/Time   NA 136 04/24/2013 0948   NA 136 01/23/2013 1413   K 4.2 04/24/2013 0948   K 3.7 01/23/2013 1413   CL 102 01/23/2013 1413   CO2 25 04/24/2013 0948   CO2 27 01/23/2013 1413   BUN 17.3 04/24/2013 0948    BUN 12 01/23/2013 1413   CREATININE 0.8 04/24/2013 0948   CREATININE 0.68 01/23/2013 1413      Component Value Date/Time   CALCIUM 9.4 04/24/2013 0948   CALCIUM 9.3 01/23/2013 1413   ALKPHOS 53 04/24/2013 0948   ALKPHOS 74 01/23/2013 1413   AST 17 04/24/2013 0948   AST 27 01/23/2013 1413   ALT 24 04/24/2013 0948   ALT 30 01/23/2013 1413   BILITOT 1.31* 04/24/2013 0948   BILITOT 0.5 01/23/2013 1413      RADIOGRAPHIC STUDIES: No results found.   ASSESSMENT:   [name] is a 38 y.o. [place], [place] female with:  #1 Stage II, invasive ductal carcinoma of the left breast, grade 3, estrogen receptor/progesterone receptor positive, Ki-67 25%, HER-2/neu negative. Status post left breast lumpectomy with left axillary sentinel lymph node biopsy on 12/10/2012 with 1 of 2 sentinel nodes revealing microscopic disease. The patient elected not to receive axillary lymph node dissection and will undergo radiation therapy after the completion of chemotherapy.    #2 status post adjuvant chemotherapy on 01/23/2013 consisting of FEC (5-FU/Epirubicin/Cytoxan) with Neulasta support given every 2 weeks for a total of 6 cycles.  This will be followed by adjuvant chemotherapy consisting of Taxol on a weekly basis for 12 weeks starting 04/17/13. Port-A-Cath placement on 01/09/2013, 2D echocardiogram with LVEF of 50% - 55%  and chemotherapy class on 01/12/2013. She underwent a repeat echocardiogram on 03/05/13 that demonstrated an improvement in her LVEF to 55-60%, she was evaluated by Dr. Haroldine Laws on 03/09/13 who cleared her to continue with Epirubicin therapy, and will f/u with her in 3 months.    #3 Right lower extremity DVT on 03/16/13, on Xarelto.  A hypercoagulable panel was drawn and demonstrated a positive lupus anticoagulant.  We will retest this in 6-8 weeks.   PLAN:  #1 Continue week #2 of Taxol therapy today with the usual premedications. Her labs are acceptable for treatment. She was noted to have a slightly  elevated bilirubin and her hemoglobin and hematocrit is stable. Since she is asymptomatic at this time we'll hold on blood transfusion. We'll closely monitor labs, if needed we'll arrange for blood transfusion.   #3  She will return in one week for labs, evaluation, and week 3 of Taxol chemotherapy.    All questions answered. [name] was encouraged to contact us in the interim with any questions or problems  I spent 20 minutes counseling the patient face to face.  The total time spent in the appointment was 30 minutes.  Wilmon Arms, M.D.  [place] 04/24/2013, 4:30 PM

## 2013-04-24 NOTE — Patient Instructions (Signed)
[facility] Discharge Instructions for Patients Receiving Chemotherapy  Today you received the following chemotherapy agents: taxol  To help prevent nausea and vomiting after your treatment, we encourage you to take your nausea medication.  Take it as often as prescribed.     If you develop nausea and vomiting that is not controlled by your nausea medication, call the clinic. If it is after clinic hours your family physician or the after hours number for the clinic or go to the Emergency Department.   BELOW ARE SYMPTOMS THAT SHOULD BE REPORTED IMMEDIATELY:  *FEVER GREATER THAN 100.5 F  *CHILLS WITH OR WITHOUT FEVER  NAUSEA AND VOMITING THAT IS NOT CONTROLLED WITH YOUR NAUSEA MEDICATION  *UNUSUAL SHORTNESS OF BREATH  *UNUSUAL BRUISING OR BLEEDING  TENDERNESS IN MOUTH AND THROAT WITH OR WITHOUT PRESENCE OF ULCERS  *URINARY PROBLEMS  *BOWEL PROBLEMS  UNUSUAL RASH Items with * indicate a potential emergency and should be followed up as soon as possible.  Feel free to call the clinic you have any questions or concerns. The clinic phone number is [phone].   I have been informed and understand all the instructions given to me. I know to contact the clinic, my physician, or go to the Emergency Department if any problems should occur. I do not have any questions at this time, but understand that I may call the clinic during office hours   should I have any questions or need assistance in obtaining follow up care.    __________________________________________  _____________  __________ Signature of Patient or Authorized Representative            Date                   Time    __________________________________________ Nurse's Signature    

## 2013-04-27 ENCOUNTER — Telehealth: Payer: Self-pay

## 2013-04-27 ENCOUNTER — Other Ambulatory Visit: Payer: Self-pay | Admitting: Hematology and Oncology

## 2013-04-27 ENCOUNTER — Other Ambulatory Visit: Payer: Self-pay

## 2013-04-27 DIAGNOSIS — D649 Anemia, unspecified: Secondary | ICD-10-CM

## 2013-04-27 NOTE — Telephone Encounter (Signed)
Patient c/o fatigue and some shortness of breath with exertion.  Stated she was supposed to call us about getting a blood transfusion if she was not feeling any better. Dr Earnest Conroy ordered cbc and ferritin for  tomorrow and advised for patient to wait on results.  Advised scheduling would be calling her with her appointment. Patient verbalized understanding.

## 2013-04-28 ENCOUNTER — Ambulatory Visit (HOSPITAL_BASED_OUTPATIENT_CLINIC_OR_DEPARTMENT_OTHER): Payer: BC Managed Care – PPO

## 2013-04-28 ENCOUNTER — Ambulatory Visit (HOSPITAL_BASED_OUTPATIENT_CLINIC_OR_DEPARTMENT_OTHER): Payer: BC Managed Care – PPO | Admitting: Hematology and Oncology

## 2013-04-28 ENCOUNTER — Other Ambulatory Visit: Payer: Self-pay

## 2013-04-28 ENCOUNTER — Telehealth: Payer: Self-pay | Admitting: Oncology

## 2013-04-28 VITALS — BP 120/76 | HR 106 | Temp 97.9°F | Resp 18 | Ht 69.0 in | Wt 242.8 lb

## 2013-04-28 DIAGNOSIS — D649 Anemia, unspecified: Secondary | ICD-10-CM

## 2013-04-28 DIAGNOSIS — E860 Dehydration: Secondary | ICD-10-CM

## 2013-04-28 DIAGNOSIS — C50319 Malignant neoplasm of lower-inner quadrant of unspecified female breast: Secondary | ICD-10-CM

## 2013-04-28 DIAGNOSIS — R0602 Shortness of breath: Secondary | ICD-10-CM

## 2013-04-28 DIAGNOSIS — C779 Secondary and unspecified malignant neoplasm of lymph node, unspecified: Secondary | ICD-10-CM

## 2013-04-28 LAB — CBC WITH DIFFERENTIAL/PLATELET
BASO%: 0 % (ref 0.0–2.0)
Basophils Absolute: 0 10*3/uL (ref 0.0–0.1)
EOS%: 0.1 % (ref 0.0–7.0)
Eosinophils Absolute: 0 10*3/uL (ref 0.0–0.5)
HEMATOCRIT: 27.2 % — AB (ref 34.8–46.6)
HGB: 8.9 g/dL — ABNORMAL LOW (ref 11.6–15.9)
LYMPH%: 15.4 % (ref 14.0–49.7)
MCH: 32.8 pg (ref 25.1–34.0)
MCHC: 32.7 g/dL (ref 31.5–36.0)
MCV: 100.4 fL (ref 79.5–101.0)
MONO#: 0.3 10*3/uL (ref 0.1–0.9)
MONO%: 4.9 % (ref 0.0–14.0)
NEUT#: 5.3 10*3/uL (ref 1.5–6.5)
NEUT%: 79.6 % — AB (ref 38.4–76.8)
PLATELETS: 272 10*3/uL (ref 145–400)
RBC: 2.71 10*6/uL — AB (ref 3.70–5.45)
RDW: 20.1 % — ABNORMAL HIGH (ref 11.2–14.5)
WBC: 6.7 10*3/uL (ref 3.9–10.3)
lymph#: 1 10*3/uL (ref 0.9–3.3)

## 2013-04-28 LAB — FERRITIN CHCC: FERRITIN: 602 ng/mL — AB (ref 9–269)

## 2013-04-28 LAB — IRON AND TIBC CHCC
%SAT: 46 % (ref 21–57)
IRON: 120 ug/dL (ref 41–142)
TIBC: 263 ug/dL (ref 236–444)
UIBC: 143 ug/dL (ref 120–384)

## 2013-04-28 LAB — HOLD TUBE, BLOOD BANK

## 2013-04-28 MED ORDER — SODIUM CHLORIDE 0.9 % IJ SOLN
10.0000 mL | Freq: Once | INTRAMUSCULAR | Status: AC
  Administered 2013-04-28: 10 mL via INTRAVENOUS
  Filled 2013-04-28: qty 10

## 2013-04-28 MED ORDER — SODIUM CHLORIDE 0.9 % IV SOLN: INTRAVENOUS | Status: DC

## 2013-04-28 MED ORDER — SODIUM CHLORIDE 0.9 % IV SOLN
INTRAVENOUS | Status: DC
  Administered 2013-04-28: 16:00:00 via INTRAVENOUS

## 2013-04-28 MED ORDER — HEPARIN SOD (PORK) LOCK FLUSH 100 UNIT/ML IV SOLN
500.0000 [IU] | Freq: Once | INTRAVENOUS | Status: AC
  Administered 2013-04-28: 500 [IU] via INTRAVENOUS
  Filled 2013-04-28: qty 5

## 2013-04-28 NOTE — Progress Notes (Signed)
[facility]  Telephone:([phone]) [phone] Fax:(336) [phone]  OFFICE PROGRESS NOTE  ID: [name]   DOB: December 11, 1975  MR#: [id number]  NLZ#:[id number]   PCP: Stacie L. Sherre Lain SU:  Jovita Kussmaul, MD  RAD ONC:  Arloa Koh, MD  CHIEF COMPLAINT: Severe fatigue  DIAGNOSIS: [name] is a 38 y.o. 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  She was seen in the multidisciplinary breast clinic on 10/23/2012 for discussion of treatment options.    STAGE:  Cancer of lower-inner quadrant of female breast  Primary site: Breast (Left)  Staging method: AJCC 7th Edition  Clinical: Stage IA (T1c, N0, cM0)  Summary: Stage IA (T1c, N0, cM0)   PRIOR THERAPY: #1  A screening mammogram in [place], [place] on 09/23/2012 she was felt to have a new mass within the left breast. An ultrasound on 10/06/2012 showed a 1.6 x 1.3 x 1.3 cm mass at the 8 o'clock position 7 cm from the nipple. At the 12 o'clock position behind and nipple was a circumscribed oval shaped 2 x 1.8 x 1.2 cm lesion perhaps representing a fibroadenoma. 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 The carcinoma was grade 2-3, estrogen receptor 80% positive, progesterone receptor 80% positive, Ki-67 25%, and HER-2/neu negative. Bilateral breast MRI on 10/22/2012 showed biopsy proven carcinoma in the left lower inner quadrant identified and measures approximately 1.9 x 1.9 x 1.8 cm.  Well-defined nodule in the right upper outer quadrant posteriorly consistent with a fibroadenoma. Probable liver cysts.There were multiple scattered nonspecific foci in each breast. No other suspicious abnormality was identified. No abnormal appearing lymph nodes (Clinical stage I, T1 N0).   #2 Status post left breast lumpectomy with left axillary sentinel lymph node biopsy on  12/10/2012 for a stage IIB, pT2, pN32mi, MX, 2.3 cm invasive ductal carcinoma, grade 3, estrogen receptor 80% positive, progesterone receptor 80% positive, Ki-67 25%, HER-2/neu negative, with 1/2 left axillary sentinel lymph nodes positive for microscopic disease.   #3 Genetic testing performed in [place], [place] with self reported by the patient as BRCA1 and BRCA2 negative.  #4 Adjuvant chemotherapy consisting of FEC (5-FU/epirubicin/Cytoxan) began on 01/23/2013. She completed 6 cycles of FEC on 04/03/13.  She was started on weekly Taxol beginning 04/17/13.  A total of 12 weekly cycles is planned.    #5  Right lower extremity DVT diagnosed on 03/16/2013, patient was placed on Xarelto $RemoveBe'15mg'HDDirJTzy$  BID.  Her dose was changed to $RemoveBe'20mg'RBaAeLlsm$  daily on 04/10/13.  CURRENT THERAPY:  Weekly taxol  INTERVAL HISTORY: [name] is a  38 y.o. female who returns today with the complaints of severe fatigue. She feels occasionally winded and is getting better now. She does not complain of any nausea or vomiting. She complains of taste changes and not drinking enough fluids.  She completed second week of Taxol therapy on Friday. She denies tingling or numbness. She denies any fevers, chills,  constipation, diarrhea, skin changes.     MEDICAL HISTORY: Past Medical History  Diagnosis Date  . Urinary incontinence     occasional  . Family history of anesthesia complication     pt's mother has hx. of post-op N/V  . Hypertension     under control with med., has been on med. since 09/2012  . Anxiety   . Breast cancer     left    ALLERGIES:  Allergies  Allergen Reactions  . Amoxicillin Other (See Comments)    States "it does not work"    MEDICATIONS:  Current Outpatient Prescriptions  Medication Sig Dispense Refill  . ALPRAZolam (XANAX) 0.25 MG tablet Take 1 tablet (0.25 mg total) by mouth at bedtime as needed for sleep.  30 tablet  2  . dexamethasone (DECADRON) 4 MG tablet Take 2 tablets by mouth once a day on  the day after chemotherapy and then take 2 tablets two times a day for 2 days. Take with food.  30 tablet  1  . HYDROcodone-acetaminophen (NORCO/VICODIN) 5-325 MG per tablet Take 1-2 tablets by mouth every 4 (four) hours as needed.  60 tablet  0  . lidocaine-prilocaine (EMLA) cream Apply topically as needed.  30 g  0  . lisinopril (PRINIVIL,ZESTRIL) 10 MG tablet Take 10 mg by mouth daily.      Marland Kitchen LORazepam (ATIVAN) 0.5 MG tablet Take 1 tablet (0.5 mg total) by mouth every 6 (six) hours as needed (Nausea or vomiting).  30 tablet  0  . omeprazole (PRILOSEC) 20 MG capsule TAKE ONE CAPSULE BY MOUTH EVERY DAY  30 capsule  2  . ondansetron (ZOFRAN) 8 MG tablet Take 1 tablet (8 mg total) by mouth 2 (two) times daily. Take two times a day starting the day after chemo for 2 days. Then take two times a day as needed for nausea or vomiting.  30 tablet  1  . prochlorperazine (COMPAZINE) 10 MG tablet Take 1 tablet (10 mg total) by mouth every 6 (six) hours as needed (Nausea or vomiting).  30 tablet  1  . Rivaroxaban (XARELTO) 20 MG TABS tablet Take 1 tablet (20 mg total) by mouth daily with supper.  30 tablet  6   Current Facility-Administered Medications  Medication Dose Route Frequency Provider Last Rate Last Dose  . 0.9 %  sodium chloride infusion   Intravenous Continuous Wilmon Arms, MD        SURGICAL HISTORY:  Past Surgical History  Procedure Laterality Date  . Cesarean section  1999  . Breast lumpectomy with needle localization and axillary sentinel lymph node bx Left 12/10/2012    Procedure: BREAST LUMPECTOMY WITH NEEDLE LOCALIZATION AND AXILLARY SENTINEL LYMPH NODE BX;  Surgeon: Merrie Roof, MD;  Location: [facility];  Service: General;  Laterality: Left;  Needle loc BCG 7:30  nuc med 9:30    . Tonsillectomy and adenoidectomy      as a teenager  . Cholecystectomy  1999  . Open reduction internal fixation (orif) tibia/fibula fracture Right   . Portacath placement Right 01/09/2013    Procedure:  INSERTION PORT-A-CATH;  Surgeon: Merrie Roof, MD;  Location: [facility];  Service: General;  Laterality: Right;    REVIEW OF SYSTEMS:  A 10 point review of systems was completed and is negative except as noted above.    PHYSICAL EXAMINATION: Blood pressure 120/76, pulse 106, temperature 97.9 F (36.6 C), temperature source Oral, resp. rate 18, height $RemoveBe'5\' 9"'FruGuxMov$  (1.753 m), weight 242 lb 12.8 oz (110.133 kg). Body mass index is 35.84 kg/(m^2). pulse ox 98% on room air GENERAL: Patient is a well appearing female in no acute distress HEENT:  Sclerae anicteric.  Oropharynx clear and moist. No ulcerations or evidence of oropharyngeal candidiasis. Neck is supple.  NODES:  No cervical, supraclavicular, or axillary lymphadenopathy palpated.  BREAST EXAM: Status post left breast lumpectomy scar noted no masses appreciated in both the breasts. No  bilateral axillary lymphadenopathy was noted LUNGS:  Clear to auscultation bilaterally.  No wheezes or rhonchi. HEART:  Regular rate and rhythm. No murmur appreciated. ABDOMEN:  Soft, nontender.  Positive, normoactive bowel sounds. No organomegaly palpated. MSK:  No focal spinal tenderness to palpation. Full range of motion bilaterally in the upper extremities. EXTREMITIES:  No peripheral edema.  2+ pedal pulses SKIN:  Clear with no obvious rashes or skin changes. No nail dyscrasia. NEURO:  Nonfocal. Well oriented.  Appropriate affect. ECOG PERFORMANCE STATUS: 0 - Asymptomatic   LABORATORY DATA: Lab Results  Component Value Date   WBC 6.7 04/28/2013   HGB 8.9* 04/28/2013   HCT 27.2* 04/28/2013   MCV 100.4 04/28/2013   PLT 272 04/28/2013      Chemistry      Component Value Date/Time   NA 136 04/24/2013 0948   NA 136 01/23/2013 1413   K 4.2 04/24/2013 0948   K 3.7 01/23/2013 1413   CL 102 01/23/2013 1413   CO2 25 04/24/2013 0948   CO2 27 01/23/2013 1413   BUN 17.3 04/24/2013 0948   BUN 12 01/23/2013 1413   CREATININE 0.8 04/24/2013 0948    CREATININE 0.68 01/23/2013 1413      Component Value Date/Time   CALCIUM 9.4 04/24/2013 0948   CALCIUM 9.3 01/23/2013 1413   ALKPHOS 53 04/24/2013 0948   ALKPHOS 74 01/23/2013 1413   AST 17 04/24/2013 0948   AST 27 01/23/2013 1413   ALT 24 04/24/2013 0948   ALT 30 01/23/2013 1413   BILITOT 1.31* 04/24/2013 0948   BILITOT 0.5 01/23/2013 1413      RADIOGRAPHIC STUDIES: No results found.   ASSESSMENT:   Chloey Ricard is a 38 y.o. [place], [place] female with:  #1 Stage II, invasive ductal carcinoma of the left breast, grade 3, estrogen receptor/progesterone receptor positive, Ki-67 25%, HER-2/neu negative. Status post left breast lumpectomy with left axillary sentinel lymph node biopsy on 12/10/2012 with 1 of 2 sentinel nodes revealing microscopic disease. The patient elected not to receive axillary lymph node dissection and will undergo radiation therapy after the completion of chemotherapy.    #2 status post adjuvant chemotherapy on 01/23/2013 consisting of FEC (5-FU/Epirubicin/Cytoxan) with Neulasta support given every 2 weeks for a total of 6 cycles.  This will be followed by adjuvant chemotherapy consisting of Taxol on a weekly basis for 12 weeks starting 04/17/13. Port-A-Cath placement on 01/09/2013, 2D echocardiogram with LVEF of 50% - 55%  and chemotherapy class on 01/12/2013. She underwent a repeat echocardiogram on 03/05/13 that demonstrated an improvement in her LVEF to 55-60%, she was evaluated by Dr. Haroldine Laws on 03/09/13 who cleared her to continue with Epirubicin therapy, and will f/u with her in 3 months.    #3 Right lower extremity DVT on 03/16/13, on Xarelto.  A hypercoagulable panel was drawn and demonstrated a positive lupus anticoagulant.  We will retest this in 6-8 weeks.  #4. Dehydration secondary to decreased po  Intake:  we'll arrange for 1 L of IV normal saline to be given today.  #5 Anemia most likely chemorelated: Her hemoglobin and hematocrit is improving. Follow up with iron  indices  ordered    #6 shortness of breath most likely secondary to anemia her pulse ox is within normal range and shortness of breath is improving with improvement in her hemoglobin and hematocrit .  I've asked the patient to call us if she gets symptomatic   #7 I have reviewed her diet plan  and  instructed her to use  boost with high-protein one can  2-3 times daily  #9  She will return on Friday  for labs, evaluation, and week 3 of Taxol chemotherapy.    All questions answered. Ms. Doland was encouraged to contact us in the interim with any questions or problems  I spent 20 minutes counseling the patient face to face.  The total time spent in the appointment was 30 minutes.  Wilmon Arms, M.D.  [facility] 04/28/2013, 2:21 PM

## 2013-04-28 NOTE — Patient Instructions (Signed)
Diarrhea Diarrhea is frequent loose and watery bowel movements. It can cause you to feel weak and dehydrated. Dehydration can cause you to become tired and thirsty, have a dry mouth, and have decreased urination that often is dark yellow. Diarrhea is a sign of another problem, most often an infection that will not last long. In most cases, diarrhea typically lasts 2 3 days. However, it can last longer if it is a sign of something more serious. It is important to treat your diarrhea as directed by your caregive to lessen or prevent future episodes of diarrhea. CAUSES  Some common causes include:  Gastrointestinal infections caused by viruses, bacteria, or parasites.  Food poisoning or food allergies.  Certain medicines, such as antibiotics, chemotherapy, and laxatives.  Artificial sweeteners and fructose.  Digestive disorders. HOME CARE INSTRUCTIONS  Ensure adequate fluid intake (hydration): have 1 cup (8 oz) of fluid for each diarrhea episode. Avoid fluids that contain simple sugars or sports drinks, fruit juices, whole milk products, and sodas. Your urine should be clear or pale yellow if you are drinking enough fluids. Hydrate with an oral rehydration solution that you can purchase at pharmacies, retail stores, and online. You can prepare an oral rehydration solution at home by mixing the following ingredients together:    tsp table salt.   tsp baking soda.   tsp salt substitute containing potassium chloride.  1  tablespoons sugar.  1 L (34 oz) of water.  Certain foods and beverages may increase the speed at which food moves through the gastrointestinal (GI) tract. These foods and beverages should be avoided and include:  Caffeinated and alcoholic beverages.  High-fiber foods, such as raw fruits and vegetables, nuts, seeds, and whole grain breads and cereals.  Foods and beverages sweetened with sugar alcohols, such as xylitol, sorbitol, and mannitol.  Some foods may be well  tolerated and may help thicken stool including:  Starchy foods, such as rice, toast, pasta, low-sugar cereal, oatmeal, grits, baked potatoes, crackers, and bagels.  Bananas.  Applesauce.  Add probiotic-rich foods to help increase healthy bacteria in the GI tract, such as yogurt and fermented milk products.  Wash your hands well after each diarrhea episode.  Only take over-the-counter or prescription medicines as directed by your caregiver.  Take a warm bath to relieve any burning or pain from frequent diarrhea episodes. SEEK IMMEDIATE MEDICAL CARE IF:   You are unable to keep fluids down.  You have persistent vomiting.  You have blood in your stool, or your stools are black and tarry.  You do not urinate in 6 8 hours, or there is only a small amount of very dark urine.  You have abdominal pain that increases or localizes.  You have weakness, dizziness, confusion, or lightheadedness.  You have a severe headache.  Your diarrhea gets worse or does not get better.  You have a fever or persistent symptoms for more than 2 3 days.  You have a fever and your symptoms suddenly get worse. MAKE SURE YOU:   Understand these instructions.  Will watch your condition.  Will get help right away if you are not doing well or get worse. Document Released: 02/23/2002 Document Revised: 02/20/2012 Document Reviewed: 11/11/2011 [facility] Patient Information 2014 [place], [place]. Dehydration, Adult Dehydration is when you lose more fluids from the body than you take in. Vital organs like the kidneys, brain, and heart cannot function without a proper amount of fluids and salt. Any loss of fluids from the body can cause  dehydration.  CAUSES   Vomiting.  Diarrhea.  Excessive sweating.  Excessive urine output.  Fever. SYMPTOMS  Mild dehydration  Thirst.  Dry lips.  Slightly dry mouth. Moderate dehydration  Very dry mouth.  Sunken eyes.  Skin does not bounce back quickly  when lightly pinched and released.  Dark urine and decreased urine production.  Decreased tear production.  Headache. Severe dehydration  Very dry mouth.  Extreme thirst.  Rapid, weak pulse (more than 100 beats per minute at rest).  Cold hands and feet.  Not able to sweat in spite of heat and temperature.  Rapid breathing.  Blue lips.  Confusion and lethargy.  Difficulty being awakened.  Minimal urine production.  No tears. DIAGNOSIS  Your caregiver will diagnose dehydration based on your symptoms and your exam. Blood and urine tests will help confirm the diagnosis. The diagnostic evaluation should also identify the cause of dehydration. TREATMENT  Treatment of mild or moderate dehydration can often be done at home by increasing the amount of fluids that you drink. It is best to drink small amounts of fluid more often. Drinking too much at one time can make vomiting worse. Refer to the home care instructions below. Severe dehydration needs to be treated at the hospital where you will probably be given intravenous (IV) fluids that contain water and electrolytes. HOME CARE INSTRUCTIONS   Ask your caregiver about specific rehydration instructions.  Drink enough fluids to keep your urine clear or pale yellow.  Drink small amounts frequently if you have nausea and vomiting.  Eat as you normally do.  Avoid:  Foods or drinks high in sugar.  Carbonated drinks.  Juice.  Extremely hot or cold fluids.  Drinks with caffeine.  Fatty, greasy foods.  Alcohol.  Tobacco.  Overeating.  Gelatin desserts.  Wash your hands well to avoid spreading bacteria and viruses.  Only take over-the-counter or prescription medicines for pain, discomfort, or fever as directed by your caregiver.  Ask your caregiver if you should continue all prescribed and over-the-counter medicines.  Keep all follow-up appointments with your caregiver. SEEK MEDICAL CARE IF:  You have  abdominal pain and it increases or stays in one area (localizes).  You have a rash, stiff neck, or severe headache.  You are irritable, sleepy, or difficult to awaken.  You are weak, dizzy, or extremely thirsty. SEEK IMMEDIATE MEDICAL CARE IF:   You are unable to keep fluids down or you get worse despite treatment.  You have frequent episodes of vomiting or diarrhea.  You have blood or green matter (bile) in your vomit.  You have blood in your stool or your stool looks black and tarry.  You have not urinated in 6 to 8 hours, or you have only urinated a small amount of very dark urine.  You have a fever.  You faint. MAKE SURE YOU:   Understand these instructions.  Will watch your condition.  Will get help right away if you are not doing well or get worse. Document Released: 03/05/2005 Document Revised: 05/28/2011 Document Reviewed: 10/23/2010 [facility] Patient Information 2014 [place], [place].

## 2013-04-28 NOTE — Telephone Encounter (Signed)
, °

## 2013-05-01 ENCOUNTER — Other Ambulatory Visit (HOSPITAL_BASED_OUTPATIENT_CLINIC_OR_DEPARTMENT_OTHER): Payer: BC Managed Care – PPO

## 2013-05-01 ENCOUNTER — Telehealth: Payer: Self-pay | Admitting: *Deleted

## 2013-05-01 ENCOUNTER — Ambulatory Visit (HOSPITAL_BASED_OUTPATIENT_CLINIC_OR_DEPARTMENT_OTHER): Payer: BC Managed Care – PPO | Admitting: Hematology and Oncology

## 2013-05-01 ENCOUNTER — Ambulatory Visit (HOSPITAL_BASED_OUTPATIENT_CLINIC_OR_DEPARTMENT_OTHER): Payer: BC Managed Care – PPO

## 2013-05-01 VITALS — BP 112/73 | HR 130 | Temp 98.8°F | Resp 20 | Ht 69.0 in | Wt 244.9 lb

## 2013-05-01 DIAGNOSIS — C50319 Malignant neoplasm of lower-inner quadrant of unspecified female breast: Secondary | ICD-10-CM

## 2013-05-01 DIAGNOSIS — Z170 Estrogen receptor positive status [ER+]: Secondary | ICD-10-CM

## 2013-05-01 DIAGNOSIS — D649 Anemia, unspecified: Secondary | ICD-10-CM

## 2013-05-01 DIAGNOSIS — C50312 Malignant neoplasm of lower-inner quadrant of left female breast: Secondary | ICD-10-CM

## 2013-05-01 DIAGNOSIS — Z5111 Encounter for antineoplastic chemotherapy: Secondary | ICD-10-CM

## 2013-05-01 DIAGNOSIS — I82409 Acute embolism and thrombosis of unspecified deep veins of unspecified lower extremity: Secondary | ICD-10-CM

## 2013-05-01 LAB — COMPREHENSIVE METABOLIC PANEL (CC13)
ALBUMIN: 3.2 g/dL — AB (ref 3.5–5.0)
ALT: 28 U/L (ref 0–55)
ANION GAP: 8 meq/L (ref 3–11)
AST: 19 U/L (ref 5–34)
Alkaline Phosphatase: 42 U/L (ref 40–150)
BUN: 10.8 mg/dL (ref 7.0–26.0)
CALCIUM: 9.3 mg/dL (ref 8.4–10.4)
CHLORIDE: 102 meq/L (ref 98–109)
CO2: 25 meq/L (ref 22–29)
Creatinine: 0.7 mg/dL (ref 0.6–1.1)
Glucose: 135 mg/dl (ref 70–140)
POTASSIUM: 4.1 meq/L (ref 3.5–5.1)
Sodium: 135 mEq/L — ABNORMAL LOW (ref 136–145)
TOTAL PROTEIN: 5.8 g/dL — AB (ref 6.4–8.3)
Total Bilirubin: 1.28 mg/dL — ABNORMAL HIGH (ref 0.20–1.20)

## 2013-05-01 LAB — CBC WITH DIFFERENTIAL/PLATELET
BASO%: 0.4 % (ref 0.0–2.0)
Basophils Absolute: 0 10*3/uL (ref 0.0–0.1)
EOS%: 1 % (ref 0.0–7.0)
Eosinophils Absolute: 0.1 10*3/uL (ref 0.0–0.5)
HEMATOCRIT: 25.9 % — AB (ref 34.8–46.6)
HGB: 8.5 g/dL — ABNORMAL LOW (ref 11.6–15.9)
LYMPH#: 1.4 10*3/uL (ref 0.9–3.3)
LYMPH%: 15.3 % (ref 14.0–49.7)
MCH: 32.3 pg (ref 25.1–34.0)
MCHC: 32.8 g/dL (ref 31.5–36.0)
MCV: 98.5 fL (ref 79.5–101.0)
MONO#: 0.6 10*3/uL (ref 0.1–0.9)
MONO%: 6.6 % (ref 0.0–14.0)
NEUT#: 7.2 10*3/uL — ABNORMAL HIGH (ref 1.5–6.5)
NEUT%: 76.7 % (ref 38.4–76.8)
Platelets: 219 10*3/uL (ref 145–400)
RBC: 2.63 10*6/uL — ABNORMAL LOW (ref 3.70–5.45)
RDW: 20.4 % — AB (ref 11.2–14.5)
WBC: 9.4 10*3/uL (ref 3.9–10.3)

## 2013-05-01 MED ORDER — DEXAMETHASONE SODIUM PHOSPHATE 20 MG/5ML IJ SOLN
20.0000 mg | Freq: Once | INTRAMUSCULAR | Status: AC
  Administered 2013-05-01: 20 mg via INTRAVENOUS

## 2013-05-01 MED ORDER — ONDANSETRON 8 MG/NS 50 ML IVPB
INTRAVENOUS | Status: AC
  Filled 2013-05-01: qty 8

## 2013-05-01 MED ORDER — HYDROCODONE-ACETAMINOPHEN 5-325 MG PO TABS: 1.0000 | ORAL_TABLET | ORAL | Status: DC | PRN

## 2013-05-01 MED ORDER — ALPRAZOLAM 0.25 MG PO TABS: 0.2500 mg | ORAL_TABLET | Freq: Every evening | ORAL | Status: DC | PRN

## 2013-05-01 MED ORDER — DEXAMETHASONE SODIUM PHOSPHATE 20 MG/5ML IJ SOLN
INTRAMUSCULAR | Status: AC
  Filled 2013-05-01: qty 5

## 2013-05-01 MED ORDER — FAMOTIDINE IN NACL 20-0.9 MG/50ML-% IV SOLN
INTRAVENOUS | Status: AC
  Filled 2013-05-01: qty 50

## 2013-05-01 MED ORDER — SODIUM CHLORIDE 0.9 % IJ SOLN
10.0000 mL | INTRAMUSCULAR | Status: DC | PRN
  Administered 2013-05-01: 10 mL
  Filled 2013-05-01: qty 10

## 2013-05-01 MED ORDER — SODIUM CHLORIDE 0.9 % IV SOLN
Freq: Once | INTRAVENOUS | Status: AC
  Administered 2013-05-01: 11:00:00 via INTRAVENOUS

## 2013-05-01 MED ORDER — DIPHENHYDRAMINE HCL 50 MG/ML IJ SOLN
50.0000 mg | Freq: Once | INTRAMUSCULAR | Status: AC
  Administered 2013-05-01: 50 mg via INTRAVENOUS

## 2013-05-01 MED ORDER — SODIUM CHLORIDE 0.9 % IV SOLN
80.0000 mg/m2 | Freq: Once | INTRAVENOUS | Status: AC
  Administered 2013-05-01: 186 mg via INTRAVENOUS
  Filled 2013-05-01: qty 31

## 2013-05-01 MED ORDER — HEPARIN SOD (PORK) LOCK FLUSH 100 UNIT/ML IV SOLN
500.0000 [IU] | Freq: Once | INTRAVENOUS | Status: AC | PRN
  Administered 2013-05-01: 500 [IU]
  Filled 2013-05-01: qty 5

## 2013-05-01 MED ORDER — DIPHENHYDRAMINE HCL 50 MG/ML IJ SOLN
INTRAMUSCULAR | Status: AC
  Filled 2013-05-01: qty 1

## 2013-05-01 MED ORDER — FAMOTIDINE IN NACL 20-0.9 MG/50ML-% IV SOLN
20.0000 mg | Freq: Once | INTRAVENOUS | Status: AC
  Administered 2013-05-01: 20 mg via INTRAVENOUS

## 2013-05-01 MED ORDER — ONDANSETRON 8 MG/50ML IVPB (CHCC)
8.0000 mg | Freq: Once | INTRAVENOUS | Status: AC
  Administered 2013-05-01: 8 mg via INTRAVENOUS

## 2013-05-01 NOTE — Patient Instructions (Signed)
[facility] Discharge Instructions for Patients Receiving Chemotherapy  Today you received the following chemotherapy agents: Taxol  To help prevent nausea and vomiting after your treatment, we encourage you to take your nausea medication as prescribed by your physician.  If you develop nausea and vomiting that is not controlled by your nausea medication, call the clinic.   BELOW ARE SYMPTOMS THAT SHOULD BE REPORTED IMMEDIATELY:  *FEVER GREATER THAN 100.5 F  *CHILLS WITH OR WITHOUT FEVER  NAUSEA AND VOMITING THAT IS NOT CONTROLLED WITH YOUR NAUSEA MEDICATION  *UNUSUAL SHORTNESS OF BREATH  *UNUSUAL BRUISING OR BLEEDING  TENDERNESS IN MOUTH AND THROAT WITH OR WITHOUT PRESENCE OF ULCERS  *URINARY PROBLEMS  *BOWEL PROBLEMS  UNUSUAL RASH Items with * indicate a potential emergency and should be followed up as soon as possible.  Feel free to call the clinic you have any questions or concerns. The clinic phone number is [phone].    

## 2013-05-01 NOTE — Telephone Encounter (Signed)
Per staff message and POF I have scheduled appts.  JMW  

## 2013-05-01 NOTE — Telephone Encounter (Signed)
appts made and printed. Pt is aware that tx will be added. i emailed MW to add the pt...td 

## 2013-05-01 NOTE — Progress Notes (Signed)
[facility]  [phone]) [phone] [phone]  OFFICE PROGRESS NOTE  ID: [name]   DOB: 07/24/1975  MR#: [id number]  WER#:[id number]   PCP: Lanetta Inch L. Sherre Lain SU:  Theresa Kussmaul, MD  RAD ONC:  Arloa Koh, MD  CHIEF COMPLAINT: For initiation of weekly chemotherapy with Taxol  DIAGNOSIS: [name] is a 38 y.o. 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  She was seen in the multidisciplinary breast clinic on 10/23/2012 for discussion of treatment options.   STAGE:  Cancer of lower-inner quadrant of female breast  Primary site: Breast (Left)  Staging method: AJCC 7th Edition  Clinical: Stage IA (T1c, N0, cM0)  Summary: Stage IA (T1c, N0, cM0)   PRIOR THERAPY: #1  A screening mammogram in [place], [place] on 09/23/2012 she was felt to have a new mass within the left breast. An ultrasound on 10/06/2012 showed a 1.6 x 1.3 x 1.3 cm mass at the 8 o'clock position 7 cm from the nipple. At the 12 o'clock position behind and nipple was a circumscribed oval shaped 2 x 1.8 x 1.2 cm lesion perhaps representing a fibroadenoma. 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 The carcinoma was grade 2-3, estrogen receptor 80% positive, progesterone receptor 80% positive, Ki-67 25%, and HER-2/neu negative. Bilateral breast MRI on 10/22/2012 showed biopsy proven carcinoma in the left lower inner quadrant identified and measures approximately 1.9 x 1.9 x 1.8 cm.  Well-defined nodule in the right upper outer quadrant posteriorly consistent with a fibroadenoma. Probable liver cysts.There were multiple scattered nonspecific foci in each breast. No other suspicious abnormality was identified. No abnormal appearing lymph nodes (Clinical stage I, T1 N0).   #2 Status post left breast lumpectomy with left  axillary sentinel lymph node biopsy on 12/10/2012 for a stage IIB, pT2, pN47m, MX, 2.3 cm invasive ductal carcinoma, grade 3, estrogen receptor 80% positive, progesterone receptor 80% positive, Ki-67 25%, HER-2/neu negative, with 1/2 left axillary sentinel lymph nodes positive for microscopic disease.   #3 [place] BRCA1 and BRCA2 negative.  #4 Adjuvant chemotherapy consisting of FEC (5-FU/epirubicin/Cytoxan) began on 01/23/2013. She completed 6 cycles of FEC on 04/03/13.  She was started on weekly Taxol beginning 04/17/13.  A total of 12 weekly cycles is planned.    #5  Right lower extremity DVT diagnosed on 03/16/2013, patient was placed on Xarelto 132mBID.  Her dose was changed to 2040maily on 04/10/13.  CURRENT THERAPY:  Weekly taxol  INTERVAL HISTORY: [name] a  38 81o. female who returns today for cycle 3 treatment with weekly Taxol. She complains of complaints of fatigue. She feels occasionally winded on exertion She does not complain of any nausea or vomiting. She complains of taste changes and not drinking enough fluids.  She denies tingling or numbness. She denies any fevers, chills, palpitations, chest pain,  constipation, diarrhea, skin changes. In view of her blood pressure changes with chemotherapy she does not take lisinopril on Thursdays and Fridays her pulse rate today is 120.   MEDICAL HISTORY: Past Medical History  Diagnosis Date  . Urinary incontinence     occasional  . Family history of anesthesia complication     pt's mother has hx. of post-op N/V  . Hypertension     under control with  med., has been on med. since 09/2012  . Anxiety   . Breast cancer     left    ALLERGIES:   Allergies  Allergen Reactions  . Amoxicillin Other (See Comments)    States "it does not work"    MEDICATIONS:  Current Outpatient Prescriptions  Medication Sig Dispense Refill  . ALPRAZolam (XANAX) 0.25  MG tablet Take 1 tablet (0.25 mg total) by mouth at bedtime as needed for sleep.  30 tablet  0  . dexamethasone (DECADRON) 4 MG tablet Take 2 tablets by mouth once a day on the day after chemotherapy and then take 2 tablets two times a day for 2 days. Take with food.  30 tablet  1  . HYDROcodone-acetaminophen (NORCO/VICODIN) 5-325 MG per tablet Take 1-2 tablets by mouth every 4 (four) hours as needed.  60 tablet  0  . lisinopril (PRINIVIL,ZESTRIL) 10 MG tablet Take 10 mg by mouth daily.      Marland Kitchen LORazepam (ATIVAN) 0.5 MG tablet Take 1 tablet (0.5 mg total) by mouth every 6 (six) hours as needed (Nausea or vomiting).  30 tablet  0  . omeprazole (PRILOSEC) 20 MG capsule TAKE ONE CAPSULE BY MOUTH EVERY DAY  30 capsule  2  . ondansetron (ZOFRAN) 8 MG tablet Take 1 tablet (8 mg total) by mouth 2 (two) times daily. Take two times a day starting the day after chemo for 2 days. Then take two times a day as needed for nausea or vomiting.  30 tablet  1  . prochlorperazine (COMPAZINE) 10 MG tablet Take 1 tablet (10 mg total) by mouth every 6 (six) hours as needed (Nausea or vomiting).  30 tablet  1  . Rivaroxaban (XARELTO) 20 MG TABS tablet Take 1 tablet (20 mg total) by mouth daily with supper.  30 tablet  6  . lidocaine-prilocaine (EMLA) cream Apply topically as needed.  30 g  0   No current facility-administered medications for this visit.   Facility-Administered Medications Ordered in Other Visits  Medication Dose Route Frequency Provider Last Rate Last Dose  . sodium chloride 0.9 % injection 10 mL  10 mL Intracatheter PRN Deatra Robinson, MD   10 mL at 05/01/13 1241    SURGICAL HISTORY:  Past Surgical History  Procedure Laterality Date  . Cesarean section  1999  . Breast lumpectomy with needle localization and axillary sentinel lymph node bx Left 12/10/2012    Procedure: BREAST LUMPECTOMY WITH NEEDLE LOCALIZATION AND AXILLARY SENTINEL LYMPH NODE BX;  Surgeon: Merrie Roof, MD;  Location: [facility];   Service: General;  Laterality: Left;  Needle loc BCG 7:30  nuc med 9:30    . Tonsillectomy and adenoidectomy      as a teenager  . Cholecystectomy  1999  . Open reduction internal fixation (orif) tibia/fibula fracture Right   . Portacath placement Right 01/09/2013    Procedure: INSERTION PORT-A-CATH;  Surgeon: Merrie Roof, MD;  Location: [facility];  Service: General;  Laterality: Right;    REVIEW OF SYSTEMS:  A 10 point review of systems was completed and is negative except as noted above.    PHYSICAL EXAMINATION: Blood pressure 112/73, pulse 130, temperature 98.8 F (37.1 C), temperature source Oral, resp. rate 20, height 5' 9"  (1.753 m), weight 244 lb 14.4 oz (111.086 kg). Body mass index is 36.15 kg/(m^2). pulse ox 98% on room air. Repeat pulse is 120 per minute GENERAL: Patient is a well appearing female in  no acute distress HEENT:  Sclerae anicteric.  Oropharynx clear and moist. No ulcerations or evidence of oropharyngeal candidiasis. Neck is supple.  NODES:  No cervical, supraclavicular, or axillary lymphadenopathy palpated.  BREAST EXAM: Status post left breast lumpectomy scar noted no masses appreciated in both the breasts. No bilateral axillary lymphadenopathy was noted LUNGS:  Clear to auscultation bilaterally.  No wheezes or rhonchi. HEART:  Regular rate and rhythm. No murmur appreciated. ABDOMEN:  Soft, nontender.  Positive, normoactive bowel sounds. No organomegaly palpated. MSK:  No focal spinal tenderness to palpation. Full range of motion bilaterally in the upper extremities. EXTREMITIES:  No peripheral edema.  2+ pedal pulses SKIN:  Clear with no obvious rashes or skin changes. No nail dyscrasia. NEURO:  Nonfocal. Well oriented.  Appropriate affect.  ECOG PERFORMANCE STATUS: 0 - Asymptomatic   LABORATORY DATA: Lab Results  Component Value Date   WBC 9.4 05/01/2013   HGB 8.5* 05/01/2013   HCT 25.9* 05/01/2013   MCV 98.5 05/01/2013   PLT 219  05/01/2013      Chemistry      Component Value Date/Time   NA 135* 05/01/2013 0940   NA 136 01/23/2013 1413   K 4.1 05/01/2013 0940   K 3.7 01/23/2013 1413   CL 102 01/23/2013 1413   CO2 25 05/01/2013 0940   CO2 27 01/23/2013 1413   BUN 10.8 05/01/2013 0940   BUN 12 01/23/2013 1413   CREATININE 0.7 05/01/2013 0940   CREATININE 0.68 01/23/2013 1413      Component Value Date/Time   CALCIUM 9.3 05/01/2013 0940   CALCIUM 9.3 01/23/2013 1413   ALKPHOS 42 05/01/2013 0940   ALKPHOS 74 01/23/2013 1413   AST 19 05/01/2013 0940   AST 27 01/23/2013 1413   ALT 28 05/01/2013 0940   ALT 30 01/23/2013 1413   BILITOT 1.28* 05/01/2013 0940   BILITOT 0.5 01/23/2013 1413      RADIOGRAPHIC STUDIES: No results found.   ASSESSMENT:   [name] is a 38 y.o. [place], [place] female with:  #1 Stage II, invasive ductal carcinoma of the left breast, grade 3, estrogen receptor/progesterone receptor positive, Ki-67 25%, HER-2/neu negative. Status post left breast lumpectomy with left axillary sentinel lymph node biopsy on 12/10/2012 with 1 of 2 sentinel nodes revealing microscopic disease. The patient elected not to receive axillary lymph node dissection and will undergo radiation therapy after the completion of chemotherapy.    #2 status post adjuvant chemotherapy on 01/23/2013 consisting of FEC (5-FU/Epirubicin/Cytoxan) with Neulasta support given every 2 weeks for a total of 6 cycles.  This will be followed by adjuvant chemotherapy consisting of Taxol on a weekly basis for 12 weeks starting 04/17/13. Port-A-Cath placement on 01/09/2013, 2D echocardiogram with LVEF of 50% - 55%  and chemotherapy class on 01/12/2013. She underwent a repeat echocardiogram on 03/05/13 that demonstrated an improvement in her LVEF to 55-60%, she was evaluated by Dr. Haroldine Laws on 03/09/13 who cleared her to continue with Epirubicin therapy, and will f/u with her in 3 months.    #3 Right lower extremity DVT on 03/16/13, on Xarelto.  A  hypercoagulable panel was drawn and demonstrated a positive lupus anticoagulant.  We will retest this in 6-8 weeks.   #5 Symptomatic Anemia most likely chemorelated: We'll arrange for transfusion of 2 units of packed RBCs If she is persistently tachycardic/symptomatic after blood transfusion will obtain the cardiology evaluation.   #6 I have reviewed her diet plan  and  instructed her to use  boost with high-protein one can  2-3 times daily  #9  we will continue week 3 of Taxol chemotherapy today  Next followup in one week for cycle 4 weekly Taxol therapy with CBC differential and CMP    All questions answered. [name] was encouraged to contact us in the interim with any questions or problems  I spent 20 minutes counseling the patient face to face.  The total time spent in the appointment was 30 minutes.  Wilmon Arms, M.D.  [facility] 05/01/2013, 12:56 PM

## 2013-05-04 ENCOUNTER — Ambulatory Visit (HOSPITAL_COMMUNITY)
Admission: RE | Admit: 2013-05-04 | Discharge: 2013-05-04 | Disposition: A | Discharge status: Home / Self Care | Payer: BC Managed Care – PPO | Source: Ambulatory Visit | Attending: Oncology | Admitting: Oncology

## 2013-05-04 ENCOUNTER — Other Ambulatory Visit: Payer: Self-pay | Admitting: *Deleted

## 2013-05-04 ENCOUNTER — Ambulatory Visit: Payer: BC Managed Care – PPO

## 2013-05-04 ENCOUNTER — Ambulatory Visit (HOSPITAL_BASED_OUTPATIENT_CLINIC_OR_DEPARTMENT_OTHER): Payer: BC Managed Care – PPO

## 2013-05-04 VITALS — BP 110/73 | HR 64 | Temp 97.6°F | Resp 18

## 2013-05-04 DIAGNOSIS — D649 Anemia, unspecified: Secondary | ICD-10-CM | POA: Insufficient documentation

## 2013-05-04 LAB — PREPARE RBC (CROSSMATCH)

## 2013-05-04 LAB — HOLD TUBE, BLOOD BANK

## 2013-05-04 MED ORDER — HEPARIN SOD (PORK) LOCK FLUSH 100 UNIT/ML IV SOLN
500.0000 [IU] | Freq: Every day | INTRAVENOUS | Status: AC | PRN
  Administered 2013-05-04: 500 [IU]
  Filled 2013-05-04: qty 5

## 2013-05-04 MED ORDER — ACETAMINOPHEN 325 MG PO TABS
650.0000 mg | ORAL_TABLET | Freq: Once | ORAL | Status: AC
  Administered 2013-05-04: 650 mg via ORAL

## 2013-05-04 MED ORDER — SODIUM CHLORIDE 0.9 % IV SOLN
250.0000 mL | Freq: Once | INTRAVENOUS | Status: AC
  Administered 2013-05-04: 250 mL via INTRAVENOUS

## 2013-05-04 MED ORDER — DIPHENHYDRAMINE HCL 50 MG/ML IJ SOLN
25.0000 mg | Freq: Once | INTRAMUSCULAR | Status: AC
  Administered 2013-05-04: 25 mg via INTRAVENOUS

## 2013-05-04 MED ORDER — SODIUM CHLORIDE 0.9 % IJ SOLN
10.0000 mL | INTRAMUSCULAR | Status: AC | PRN
  Administered 2013-05-04: 10 mL
  Filled 2013-05-04: qty 10

## 2013-05-04 MED ORDER — FUROSEMIDE 10 MG/ML IJ SOLN
20.0000 mg | Freq: Once | INTRAMUSCULAR | Status: AC
  Administered 2013-05-04: 20 mg via INTRAVENOUS

## 2013-05-04 MED ORDER — ACETAMINOPHEN 325 MG PO TABS
ORAL_TABLET | ORAL | Status: AC
  Filled 2013-05-04: qty 2

## 2013-05-04 MED ORDER — DIPHENHYDRAMINE HCL 25 MG PO CAPS
ORAL_CAPSULE | ORAL | Status: AC
  Filled 2013-05-04: qty 1

## 2013-05-04 NOTE — Patient Instructions (Signed)
Blood Transfusion Information WHAT IS A BLOOD TRANSFUSION? A transfusion is the replacement of blood or some of its parts. Blood is made up of multiple cells which provide different functions.  Red blood cells carry oxygen and are used for blood loss replacement.  White blood cells fight against infection.  Platelets control bleeding.  Plasma helps clot blood.  Other blood products are available for specialized needs, such as hemophilia or other clotting disorders. BEFORE THE TRANSFUSION  Who gives blood for transfusions?   You may be able to donate blood to be used at a later date on yourself (autologous donation).  Relatives can be asked to donate blood. This is generally not any safer than if you have received blood from a stranger. The same precautions are taken to ensure safety when a relative's blood is donated.  Healthy volunteers who are fully evaluated to make sure their blood is safe. This is blood bank blood. Transfusion therapy is the safest it has ever been in the practice of medicine. Before blood is taken from a donor, a complete history is taken to make sure that person has no history of diseases nor engages in risky social behavior (examples are intravenous drug use or sexual activity with multiple partners). The donor's travel history is screened to minimize risk of transmitting infections, such as malaria. The donated blood is tested for signs of infectious diseases, such as HIV and hepatitis. The blood is then tested to be sure it is compatible with you in order to minimize the chance of a transfusion reaction. If you or a relative donates blood, this is often done in anticipation of surgery and is not appropriate for emergency situations. It takes many days to process the donated blood. RISKS AND COMPLICATIONS Although transfusion therapy is very safe and saves many lives, the main dangers of transfusion include:   Getting an infectious disease.  Developing a  transfusion reaction. This is an allergic reaction to something in the blood you were given. Every precaution is taken to prevent this. The decision to have a blood transfusion has been considered carefully by your caregiver before blood is given. Blood is not given unless the benefits outweigh the risks. AFTER THE TRANSFUSION  Right after receiving a blood transfusion, you will usually feel much better and more energetic. This is especially true if your red blood cells have gotten low (anemic). The transfusion raises the level of the red blood cells which carry oxygen, and this usually causes an energy increase.  The nurse administering the transfusion will monitor you carefully for complications. HOME CARE INSTRUCTIONS  No special instructions are needed after a transfusion. You may find your energy is better. Speak with your caregiver about any limitations on activity for underlying diseases you may have. SEEK MEDICAL CARE IF:   Your condition is not improving after your transfusion.  You develop redness or irritation at the intravenous (IV) site. SEEK IMMEDIATE MEDICAL CARE IF:  Any of the following symptoms occur over the next 12 hours:  Shaking chills.  You have a temperature by mouth above 102 F (38.9 C), not controlled by medicine.  Chest, back, or muscle pain.  People around you feel you are not acting correctly or are confused.  Shortness of breath or difficulty breathing.  Dizziness and fainting.  You get a rash or develop hives.  You have a decrease in urine output.  Your urine turns a dark color or changes to pink, red, or brown. Any of the following   symptoms occur over the next 10 days:  You have a temperature by mouth above 102 F (38.9 C), not controlled by medicine.  Shortness of breath.  Weakness after normal activity.  The white part of the eye turns yellow (jaundice).  You have a decrease in the amount of urine or are urinating less often.  Your  urine turns a dark color or changes to pink, red, or brown. Document Released: 03/02/2000 Document Revised: 05/28/2011 Document Reviewed: 10/20/2007 ExitCare Patient Information 2014 [employer].  

## 2013-05-05 LAB — TYPE AND SCREEN
ABO/RH(D): A POS
Antibody Screen: NEGATIVE
UNIT DIVISION: 0
UNIT DIVISION: 0

## 2013-05-08 ENCOUNTER — Ambulatory Visit (HOSPITAL_BASED_OUTPATIENT_CLINIC_OR_DEPARTMENT_OTHER): Payer: BC Managed Care – PPO

## 2013-05-08 ENCOUNTER — Other Ambulatory Visit (HOSPITAL_BASED_OUTPATIENT_CLINIC_OR_DEPARTMENT_OTHER): Payer: BC Managed Care – PPO

## 2013-05-08 ENCOUNTER — Ambulatory Visit (HOSPITAL_BASED_OUTPATIENT_CLINIC_OR_DEPARTMENT_OTHER): Payer: BC Managed Care – PPO | Admitting: Adult Health

## 2013-05-08 ENCOUNTER — Encounter: Payer: Self-pay | Admitting: Adult Health

## 2013-05-08 VITALS — BP 121/76 | HR 115 | Temp 98.6°F | Resp 20 | Ht 69.0 in | Wt 240.2 lb

## 2013-05-08 DIAGNOSIS — Z170 Estrogen receptor positive status [ER+]: Secondary | ICD-10-CM

## 2013-05-08 DIAGNOSIS — I82409 Acute embolism and thrombosis of unspecified deep veins of unspecified lower extremity: Secondary | ICD-10-CM

## 2013-05-08 DIAGNOSIS — C50319 Malignant neoplasm of lower-inner quadrant of unspecified female breast: Secondary | ICD-10-CM

## 2013-05-08 DIAGNOSIS — Z5111 Encounter for antineoplastic chemotherapy: Secondary | ICD-10-CM

## 2013-05-08 DIAGNOSIS — C50312 Malignant neoplasm of lower-inner quadrant of left female breast: Secondary | ICD-10-CM

## 2013-05-08 LAB — CBC WITH DIFFERENTIAL/PLATELET
BASO%: 0.5 % (ref 0.0–2.0)
Basophils Absolute: 0 10*3/uL (ref 0.0–0.1)
EOS%: 2.3 % (ref 0.0–7.0)
Eosinophils Absolute: 0.2 10*3/uL (ref 0.0–0.5)
HEMATOCRIT: 34.2 % — AB (ref 34.8–46.6)
HGB: 11.4 g/dL — ABNORMAL LOW (ref 11.6–15.9)
LYMPH#: 1.2 10*3/uL (ref 0.9–3.3)
LYMPH%: 13.8 % — ABNORMAL LOW (ref 14.0–49.7)
MCH: 31.3 pg (ref 25.1–34.0)
MCHC: 33.3 g/dL (ref 31.5–36.0)
MCV: 94 fL (ref 79.5–101.0)
MONO#: 0.6 10*3/uL (ref 0.1–0.9)
MONO%: 6.4 % (ref 0.0–14.0)
NEUT#: 6.7 10*3/uL — ABNORMAL HIGH (ref 1.5–6.5)
NEUT%: 77 % — AB (ref 38.4–76.8)
Platelets: 194 10*3/uL (ref 145–400)
RBC: 3.64 10*6/uL — ABNORMAL LOW (ref 3.70–5.45)
RDW: 18.7 % — AB (ref 11.2–14.5)
WBC: 8.8 10*3/uL (ref 3.9–10.3)

## 2013-05-08 LAB — COMPREHENSIVE METABOLIC PANEL (CC13)
ALBUMIN: 3.3 g/dL — AB (ref 3.5–5.0)
ALT: 33 U/L (ref 0–55)
ANION GAP: 10 meq/L (ref 3–11)
AST: 30 U/L (ref 5–34)
Alkaline Phosphatase: 52 U/L (ref 40–150)
BUN: 15.1 mg/dL (ref 7.0–26.0)
CO2: 23 mEq/L (ref 22–29)
Calcium: 9.4 mg/dL (ref 8.4–10.4)
Chloride: 101 mEq/L (ref 98–109)
Creatinine: 0.7 mg/dL (ref 0.6–1.1)
Glucose: 108 mg/dl (ref 70–140)
POTASSIUM: 3.9 meq/L (ref 3.5–5.1)
Sodium: 134 mEq/L — ABNORMAL LOW (ref 136–145)
Total Bilirubin: 2.05 mg/dL — ABNORMAL HIGH (ref 0.20–1.20)
Total Protein: 6.3 g/dL — ABNORMAL LOW (ref 6.4–8.3)

## 2013-05-08 LAB — TECHNOLOGIST REVIEW

## 2013-05-08 MED ORDER — DEXAMETHASONE SODIUM PHOSPHATE 20 MG/5ML IJ SOLN
INTRAMUSCULAR | Status: AC
  Filled 2013-05-08: qty 5

## 2013-05-08 MED ORDER — FAMOTIDINE IN NACL 20-0.9 MG/50ML-% IV SOLN
20.0000 mg | Freq: Once | INTRAVENOUS | Status: AC
  Administered 2013-05-08: 20 mg via INTRAVENOUS

## 2013-05-08 MED ORDER — SODIUM CHLORIDE 0.9 % IJ SOLN
10.0000 mL | INTRAMUSCULAR | Status: DC | PRN
  Administered 2013-05-08: 10 mL
  Filled 2013-05-08: qty 10

## 2013-05-08 MED ORDER — SODIUM CHLORIDE 0.9 % IV SOLN
Freq: Once | INTRAVENOUS | Status: AC
  Administered 2013-05-08: 14:00:00 via INTRAVENOUS

## 2013-05-08 MED ORDER — FAMOTIDINE IN NACL 20-0.9 MG/50ML-% IV SOLN
INTRAVENOUS | Status: AC
  Filled 2013-05-08: qty 50

## 2013-05-08 MED ORDER — SODIUM CHLORIDE 0.9 % IV SOLN
80.0000 mg/m2 | Freq: Once | INTRAVENOUS | Status: AC
  Administered 2013-05-08: 186 mg via INTRAVENOUS
  Filled 2013-05-08: qty 31

## 2013-05-08 MED ORDER — DIPHENHYDRAMINE HCL 50 MG/ML IJ SOLN
50.0000 mg | Freq: Once | INTRAMUSCULAR | Status: AC
  Administered 2013-05-08: 50 mg via INTRAVENOUS

## 2013-05-08 MED ORDER — ONDANSETRON 8 MG/50ML IVPB (CHCC)
8.0000 mg | Freq: Once | INTRAVENOUS | Status: AC
  Administered 2013-05-08: 8 mg via INTRAVENOUS

## 2013-05-08 MED ORDER — HEPARIN SOD (PORK) LOCK FLUSH 100 UNIT/ML IV SOLN
500.0000 [IU] | Freq: Once | INTRAVENOUS | Status: AC | PRN
  Administered 2013-05-08: 500 [IU]
  Filled 2013-05-08: qty 5

## 2013-05-08 MED ORDER — ONDANSETRON 8 MG/NS 50 ML IVPB
INTRAVENOUS | Status: AC
  Filled 2013-05-08: qty 8

## 2013-05-08 MED ORDER — DEXAMETHASONE SODIUM PHOSPHATE 20 MG/5ML IJ SOLN
20.0000 mg | Freq: Once | INTRAMUSCULAR | Status: AC
  Administered 2013-05-08: 20 mg via INTRAVENOUS

## 2013-05-08 MED ORDER — DIPHENHYDRAMINE HCL 50 MG/ML IJ SOLN
INTRAMUSCULAR | Status: AC
  Filled 2013-05-08: qty 1

## 2013-05-08 NOTE — Patient Instructions (Signed)
[facility] Discharge Instructions for Patients Receiving Chemotherapy  Today you received the following chemotherapy agents TAXOL To help prevent nausea and vomiting after your treatment, we encourage you to take your nausea medication as presciribed. If you develop nausea and vomiting that is not controlled by your nausea medication, call the clinic.   BELOW ARE SYMPTOMS THAT SHOULD BE REPORTED IMMEDIATELY:  *FEVER GREATER THAN 100.5 F  *CHILLS WITH OR WITHOUT FEVER  NAUSEA AND VOMITING THAT IS NOT CONTROLLED WITH YOUR NAUSEA MEDICATION  *UNUSUAL SHORTNESS OF BREATH  *UNUSUAL BRUISING OR BLEEDING  TENDERNESS IN MOUTH AND THROAT WITH OR WITHOUT PRESENCE OF ULCERS  *URINARY PROBLEMS  *BOWEL PROBLEMS  UNUSUAL RASH Items with * indicate a potential emergency and should be followed up as soon as possible.  Feel free to call the clinic you have any questions or concerns. The clinic phone number is [phone].

## 2013-05-08 NOTE — Progress Notes (Signed)
[place]  Telephone:(336) [phone] Fax:(336) [phone]  OFFICE PROGRESS NOTE  ID: [name]   DOB: 05/03/1975  MR#: [id number]  WLN#:[id number]   PCP: [name] SU:  Theresa Kussmaul, MD  RAD ONC:  Theresa Koh, MD  CHIEF COMPLAINT: For initiation of weekly chemotherapy with Taxol  DIAGNOSIS: [name] is a 38 y.o. 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  She was seen in the multidisciplinary breast clinic on 10/23/2012 for discussion of treatment options.   STAGE:  Cancer of lower-inner quadrant of female breast  Primary site: Breast (Left)  Staging method: AJCC 7th Edition  Clinical: Stage IA (T1c, N0, cM0)  Summary: Stage IA (T1c, N0, cM0)   PRIOR THERAPY: #1  A screening mammogram in [place], [place] on 09/23/2012 she was felt to have a new mass within the left breast. An ultrasound on 10/06/2012 showed a 1.6 x 1.3 x 1.3 cm mass at the 8 o'clock position 7 cm from the nipple. At the 12 o'clock position behind and nipple was a circumscribed oval shaped 2 x 1.8 x 1.2 cm lesion perhaps representing a fibroadenoma. 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 The carcinoma was grade 2-3, estrogen receptor 80% positive, progesterone receptor 80% positive, Ki-67 25%, and HER-2/neu negative. Bilateral breast MRI on 10/22/2012 showed biopsy proven carcinoma in the left lower inner quadrant identified and measures approximately 1.9 x 1.9 x 1.8 cm.  Well-defined nodule in the right upper outer quadrant posteriorly consistent with a fibroadenoma. Probable liver cysts.There were multiple scattered nonspecific foci in each breast. No other suspicious abnormality was identified. No abnormal appearing lymph nodes (Clinical stage I, T1 N0).   #2 Status post left breast lumpectomy with left  axillary sentinel lymph node biopsy on 12/10/2012 for a stage IIB, pT2, pN43m, MX, 2.3 cm invasive ductal carcinoma, grade 3, estrogen receptor 80% positive, progesterone receptor 80% positive, Ki-67 25%, HER-2/neu negative, with 1/2 left axillary sentinel lymph nodes positive for microscopic disease.   #3 [place]1 and BRCA2 negative.  #4 Adjuvant chemotherapy consisting of FEC (5-FU/epirubicin/Cytoxan) began on 01/23/2013. She completed 6 cycles of FEC on 04/03/13.  She was started on weekly Taxol beginning 04/17/13.  A total of 12 weekly cycles is planned.    #5  Right lower extremity DVT diagnosed on 03/16/2013, patient was placed on Xarelto 138mBID.  Her dose was changed to 2037maily on 04/10/13.  CURRENT THERAPY:  Weekly taxol  INTERVAL HISTORY: [name] a  38 10o. female who returns today for cycle 4 treatment with weekly Taxol. She is doing well today.  She is slightly fatigued.  She continues to take Xarelto 66m16mily and is tolerating it well.  She does have occasional nose bleeds.  They last less than a minute and she never has any trouble getting them to stop.  She has lost some fingernails, but denies fevers, chills, nail infections, nausea, vomiting, constipation, diarrhea, numbness.    MEDICAL HISTORY: Past Medical History  Diagnosis Date  . Urinary incontinence     occasional  . Family history of anesthesia complication     pt's mother has hx. of post-op N/V  . Hypertension     under control with med., has been on med. since 09/2012  .  Anxiety   . Breast cancer     left    ALLERGIES:   Allergies  Allergen Reactions  . Amoxicillin Other (See Comments)    States "it does not work"    MEDICATIONS:  Current Outpatient Prescriptions  Medication Sig Dispense Refill  . ALPRAZolam (XANAX) 0.25 MG tablet Take 1 tablet (0.25 mg total) by mouth at bedtime as needed for sleep.  30 tablet   0  . HYDROcodone-acetaminophen (NORCO/VICODIN) 5-325 MG per tablet Take 1-2 tablets by mouth every 4 (four) hours as needed.  60 tablet  0  . lidocaine-prilocaine (EMLA) cream Apply topically as needed.  30 g  0  . lisinopril (PRINIVIL,ZESTRIL) 10 MG tablet Take 10 mg by mouth daily.      Marland Kitchen LORazepam (ATIVAN) 0.5 MG tablet Take 1 tablet (0.5 mg total) by mouth every 6 (six) hours as needed (Nausea or vomiting).  30 tablet  0  . omeprazole (PRILOSEC) 20 MG capsule TAKE ONE CAPSULE BY MOUTH EVERY DAY  30 capsule  2  . Rivaroxaban (XARELTO) 20 MG TABS tablet Take 1 tablet (20 mg total) by mouth daily with supper.  30 tablet  6  . dexamethasone (DECADRON) 4 MG tablet Take 2 tablets by mouth once a day on the day after chemotherapy and then take 2 tablets two times a day for 2 days. Take with food.  30 tablet  1  . ondansetron (ZOFRAN) 8 MG tablet Take 1 tablet (8 mg total) by mouth 2 (two) times daily. Take two times a day starting the day after chemo for 2 days. Then take two times a day as needed for nausea or vomiting.  30 tablet  1  . prochlorperazine (COMPAZINE) 10 MG tablet Take 1 tablet (10 mg total) by mouth every 6 (six) hours as needed (Nausea or vomiting).  30 tablet  1   No current facility-administered medications for this visit.   Facility-Administered Medications Ordered in Other Visits  Medication Dose Route Frequency Provider Last Rate Last Dose  . sodium chloride 0.9 % injection 10 mL  10 mL Intracatheter PRN Deatra Robinson, MD   10 mL at 05/08/13 1544    SURGICAL HISTORY:  Past Surgical History  Procedure Laterality Date  . Cesarean section  1999  . Breast lumpectomy with needle localization and axillary sentinel lymph node bx Left 12/10/2012    Procedure: BREAST LUMPECTOMY WITH NEEDLE LOCALIZATION AND AXILLARY SENTINEL LYMPH NODE BX;  Surgeon: Merrie Roof, MD;  Location: [facility];  Service: General;  Laterality: Left;  Needle loc BCG 7:30  nuc med 9:30    . Tonsillectomy and  adenoidectomy      as a teenager  . Cholecystectomy  1999  . Open reduction internal fixation (orif) tibia/fibula fracture Right   . Portacath placement Right 01/09/2013    Procedure: INSERTION PORT-A-CATH;  Surgeon: Merrie Roof, MD;  Location: [facility];  Service: General;  Laterality: Right;    REVIEW OF SYSTEMS:  A 10 point review of systems was completed and is negative except as noted above.    PHYSICAL EXAMINATION: Blood pressure 121/76, pulse 115, temperature 98.6 F (37 C), temperature source Oral, resp. rate 20, height 5' 9"  (1.753 m), weight 240 lb 3.2 oz (108.954 kg). Body mass index is 35.46 kg/(m^2). pulse ox 98% on room air. Repeat pulse is 120 per minute GENERAL: Patient is a well appearing female in no acute distress HEENT:  Sclerae anicteric.  Oropharynx  clear and moist. No ulcerations or evidence of oropharyngeal candidiasis. Neck is supple.  NODES:  No cervical, supraclavicular, or axillary lymphadenopathy palpated.  BREAST EXAM: Status post left breast lumpectomy scar noted no masses appreciated in both the breasts. No bilateral axillary lymphadenopathy was noted LUNGS:  Clear to auscultation bilaterally.  No wheezes or rhonchi. HEART:  Regular rate and rhythm. No murmur appreciated. ABDOMEN:  Soft, nontender.  Positive, normoactive bowel sounds. No organomegaly palpated. MSK:  No focal spinal tenderness to palpation. Full range of motion bilaterally in the upper extremities. EXTREMITIES:  No peripheral edema.  2+ pedal pulses SKIN:  Clear with no obvious rashes or skin changes. No nail dyscrasia. NEURO:  Nonfocal. Well oriented.  Appropriate affect. ECOG PERFORMANCE STATUS: 0 - Asymptomatic   LABORATORY DATA: Lab Results  Component Value Date   WBC 8.8 05/08/2013   HGB 11.4* 05/08/2013   HCT 34.2* 05/08/2013   MCV 94.0 05/08/2013   PLT 194 05/08/2013      Chemistry      Component Value Date/Time   NA 134* 05/08/2013 1129   NA 136 01/23/2013  1413   K 3.9 05/08/2013 1129   K 3.7 01/23/2013 1413   CL 102 01/23/2013 1413   CO2 23 05/08/2013 1129   CO2 27 01/23/2013 1413   BUN 15.1 05/08/2013 1129   BUN 12 01/23/2013 1413   CREATININE 0.7 05/08/2013 1129   CREATININE 0.68 01/23/2013 1413      Component Value Date/Time   CALCIUM 9.4 05/08/2013 1129   CALCIUM 9.3 01/23/2013 1413   ALKPHOS 52 05/08/2013 1129   ALKPHOS 74 01/23/2013 1413   AST 30 05/08/2013 1129   AST 27 01/23/2013 1413   ALT 33 05/08/2013 1129   ALT 30 01/23/2013 1413   BILITOT 2.05* 05/08/2013 1129   BILITOT 0.5 01/23/2013 1413      RADIOGRAPHIC STUDIES: No results found.   ASSESSMENT:   [name] is a 38 y.o. [place], [place] female with:  #1 Stage II, invasive ductal carcinoma of the left breast, grade 3, estrogen receptor/progesterone receptor positive, Ki-67 25%, HER-2/neu negative. Status post left breast lumpectomy with left axillary sentinel lymph node biopsy on 12/10/2012 with 1 of 2 sentinel nodes revealing microscopic disease. The patient elected not to receive axillary lymph node dissection and will undergo radiation therapy after the completion of chemotherapy.    #2 status post adjuvant chemotherapy on 01/23/2013 consisting of FEC (5-FU/Epirubicin/Cytoxan) with Neulasta support given every 2 weeks for a total of 6 cycles.  This will be followed by adjuvant chemotherapy consisting of Taxol on a weekly basis for 12 weeks starting 04/17/13. Port-A-Cath placement on 01/09/2013, 2D echocardiogram with LVEF of 50% - 55%  and chemotherapy class on 01/12/2013. She underwent a repeat echocardiogram on 03/05/13 that demonstrated an improvement in her LVEF to 55-60%, she was evaluated by Dr. Haroldine Laws on 03/09/13 who cleared her to continue with Epirubicin therapy, and will f/u with her in 3 months.    #3 Right lower extremity DVT on 03/16/13, on Xarelto.  A hypercoagulable panel was drawn and demonstrated a positive lupus anticoagulant.  We will retest this in 6-8  weeks.  #4 Patient will proceed with week four of Taxol today.    Next followup in one week for cycle 5 weekly Taxol therapy with CBC differential and CMP    All questions answered. [name] was encouraged to contact us in the interim with any questions or problems  I spent 25 minutes counseling the  patient face to face.  The total time spent in the appointment was 30 minutes.  Minette Headland, [place] [phone] 05/08/2013, 6:41 PM

## 2013-05-14 ENCOUNTER — Other Ambulatory Visit: Payer: Self-pay | Admitting: Adult Health

## 2013-05-14 DIAGNOSIS — C50319 Malignant neoplasm of lower-inner quadrant of unspecified female breast: Secondary | ICD-10-CM

## 2013-05-15 ENCOUNTER — Encounter: Payer: Self-pay | Admitting: Hematology and Oncology

## 2013-05-15 ENCOUNTER — Ambulatory Visit (HOSPITAL_COMMUNITY)
Admission: RE | Admit: 2013-05-15 | Discharge: 2013-05-15 | Disposition: A | Discharge status: Home / Self Care | Payer: BC Managed Care – PPO | Source: Ambulatory Visit | Attending: Hematology and Oncology | Admitting: Hematology and Oncology

## 2013-05-15 ENCOUNTER — Encounter (HOSPITAL_COMMUNITY): Payer: Self-pay

## 2013-05-15 ENCOUNTER — Other Ambulatory Visit (HOSPITAL_BASED_OUTPATIENT_CLINIC_OR_DEPARTMENT_OTHER): Payer: BC Managed Care – PPO

## 2013-05-15 ENCOUNTER — Ambulatory Visit (HOSPITAL_BASED_OUTPATIENT_CLINIC_OR_DEPARTMENT_OTHER): Payer: BC Managed Care – PPO

## 2013-05-15 ENCOUNTER — Ambulatory Visit (HOSPITAL_BASED_OUTPATIENT_CLINIC_OR_DEPARTMENT_OTHER): Payer: BC Managed Care – PPO | Admitting: Hematology and Oncology

## 2013-05-15 VITALS — BP 104/59 | HR 91 | Temp 99.2°F | Resp 18

## 2013-05-15 DIAGNOSIS — Z170 Estrogen receptor positive status [ER+]: Secondary | ICD-10-CM

## 2013-05-15 DIAGNOSIS — D6859 Other primary thrombophilia: Secondary | ICD-10-CM

## 2013-05-15 DIAGNOSIS — R918 Other nonspecific abnormal finding of lung field: Secondary | ICD-10-CM

## 2013-05-15 DIAGNOSIS — C50919 Malignant neoplasm of unspecified site of unspecified female breast: Secondary | ICD-10-CM | POA: Insufficient documentation

## 2013-05-15 DIAGNOSIS — Z9221 Personal history of antineoplastic chemotherapy: Secondary | ICD-10-CM | POA: Insufficient documentation

## 2013-05-15 DIAGNOSIS — K7689 Other specified diseases of liver: Secondary | ICD-10-CM | POA: Insufficient documentation

## 2013-05-15 DIAGNOSIS — Z5111 Encounter for antineoplastic chemotherapy: Secondary | ICD-10-CM

## 2013-05-15 DIAGNOSIS — I82409 Acute embolism and thrombosis of unspecified deep veins of unspecified lower extremity: Secondary | ICD-10-CM

## 2013-05-15 DIAGNOSIS — J019 Acute sinusitis, unspecified: Secondary | ICD-10-CM

## 2013-05-15 DIAGNOSIS — C50319 Malignant neoplasm of lower-inner quadrant of unspecified female breast: Secondary | ICD-10-CM

## 2013-05-15 DIAGNOSIS — R911 Solitary pulmonary nodule: Secondary | ICD-10-CM | POA: Insufficient documentation

## 2013-05-15 DIAGNOSIS — R0602 Shortness of breath: Secondary | ICD-10-CM | POA: Insufficient documentation

## 2013-05-15 DIAGNOSIS — C50312 Malignant neoplasm of lower-inner quadrant of left female breast: Secondary | ICD-10-CM

## 2013-05-15 LAB — COMPREHENSIVE METABOLIC PANEL (CC13)
ALK PHOS: 50 U/L (ref 40–150)
ALT: 40 U/L (ref 0–55)
AST: 20 U/L (ref 5–34)
Albumin: 3.3 g/dL — ABNORMAL LOW (ref 3.5–5.0)
Anion Gap: 9 mEq/L (ref 3–11)
BILIRUBIN TOTAL: 1.52 mg/dL — AB (ref 0.20–1.20)
BUN: 13.2 mg/dL (ref 7.0–26.0)
CO2: 26 mEq/L (ref 22–29)
CREATININE: 0.7 mg/dL (ref 0.6–1.1)
Calcium: 9.6 mg/dL (ref 8.4–10.4)
Chloride: 101 mEq/L (ref 98–109)
GLUCOSE: 122 mg/dL (ref 70–140)
Potassium: 3.9 mEq/L (ref 3.5–5.1)
Sodium: 136 mEq/L (ref 136–145)
Total Protein: 6.3 g/dL — ABNORMAL LOW (ref 6.4–8.3)

## 2013-05-15 LAB — CBC WITH DIFFERENTIAL/PLATELET
BASO%: 0.8 % (ref 0.0–2.0)
BASOS ABS: 0.1 10*3/uL (ref 0.0–0.1)
EOS ABS: 0.2 10*3/uL (ref 0.0–0.5)
EOS%: 2.6 % (ref 0.0–7.0)
HCT: 33.3 % — ABNORMAL LOW (ref 34.8–46.6)
HEMOGLOBIN: 11.1 g/dL — AB (ref 11.6–15.9)
LYMPH%: 15.4 % (ref 14.0–49.7)
MCH: 31.5 pg (ref 25.1–34.0)
MCHC: 33.3 g/dL (ref 31.5–36.0)
MCV: 94.5 fL (ref 79.5–101.0)
MONO#: 0.5 10*3/uL (ref 0.1–0.9)
MONO%: 6.6 % (ref 0.0–14.0)
NEUT%: 74.6 % (ref 38.4–76.8)
NEUTROS ABS: 5.2 10*3/uL (ref 1.5–6.5)
Platelets: 232 10*3/uL (ref 145–400)
RBC: 3.52 10*6/uL — ABNORMAL LOW (ref 3.70–5.45)
RDW: 19.2 % — ABNORMAL HIGH (ref 11.2–14.5)
WBC: 7 10*3/uL (ref 3.9–10.3)
lymph#: 1.1 10*3/uL (ref 0.9–3.3)

## 2013-05-15 LAB — TECHNOLOGIST REVIEW

## 2013-05-15 LAB — BILIRUBIN, DIRECT: BILIRUBIN DIRECT: 0.3 mg/dL (ref 0.0–0.3)

## 2013-05-15 MED ORDER — HEPARIN SOD (PORK) LOCK FLUSH 100 UNIT/ML IV SOLN
500.0000 [IU] | Freq: Once | INTRAVENOUS | Status: AC | PRN
  Administered 2013-05-15: 500 [IU]
  Filled 2013-05-15: qty 5

## 2013-05-15 MED ORDER — CLARITHROMYCIN 500 MG PO TABS: 500.0000 mg | ORAL_TABLET | Freq: Two times a day (BID) | ORAL | Status: DC

## 2013-05-15 MED ORDER — DEXAMETHASONE SODIUM PHOSPHATE 20 MG/5ML IJ SOLN
20.0000 mg | Freq: Once | INTRAMUSCULAR | Status: AC
  Administered 2013-05-15: 20 mg via INTRAVENOUS

## 2013-05-15 MED ORDER — SODIUM CHLORIDE 0.9 % IJ SOLN
10.0000 mL | INTRAMUSCULAR | Status: DC | PRN
Start: 2013-05-15 — End: 2013-05-15
  Administered 2013-05-15: 10 mL
  Filled 2013-05-15: qty 10

## 2013-05-15 MED ORDER — SODIUM CHLORIDE 0.9 % IV SOLN
Freq: Once | INTRAVENOUS | Status: AC
  Administered 2013-05-15: 13:00:00 via INTRAVENOUS

## 2013-05-15 MED ORDER — DIPHENHYDRAMINE HCL 50 MG/ML IJ SOLN
INTRAMUSCULAR | Status: AC
  Filled 2013-05-15: qty 1

## 2013-05-15 MED ORDER — GOSERELIN ACETATE 3.6 MG ~~LOC~~ IMPL
3.6000 mg | DRUG_IMPLANT | Freq: Once | SUBCUTANEOUS | Status: AC
  Administered 2013-05-15: 3.6 mg via SUBCUTANEOUS
  Filled 2013-05-15: qty 3.6

## 2013-05-15 MED ORDER — DIPHENHYDRAMINE HCL 50 MG/ML IJ SOLN
50.0000 mg | Freq: Once | INTRAMUSCULAR | Status: AC
  Administered 2013-05-15: 50 mg via INTRAVENOUS

## 2013-05-15 MED ORDER — IOHEXOL 350 MG/ML SOLN
100.0000 mL | Freq: Once | INTRAVENOUS | Status: AC | PRN
  Administered 2013-05-15: 100 mL via INTRAVENOUS

## 2013-05-15 MED ORDER — ONDANSETRON 8 MG/NS 50 ML IVPB
INTRAVENOUS | Status: AC
  Filled 2013-05-15: qty 8

## 2013-05-15 MED ORDER — FAMOTIDINE IN NACL 20-0.9 MG/50ML-% IV SOLN
INTRAVENOUS | Status: AC
  Filled 2013-05-15: qty 50

## 2013-05-15 MED ORDER — DEXAMETHASONE SODIUM PHOSPHATE 20 MG/5ML IJ SOLN
INTRAMUSCULAR | Status: AC
  Filled 2013-05-15: qty 5

## 2013-05-15 MED ORDER — ONDANSETRON 8 MG/50ML IVPB (CHCC)
8.0000 mg | Freq: Once | INTRAVENOUS | Status: AC
  Administered 2013-05-15: 8 mg via INTRAVENOUS

## 2013-05-15 MED ORDER — FAMOTIDINE IN NACL 20-0.9 MG/50ML-% IV SOLN
20.0000 mg | Freq: Once | INTRAVENOUS | Status: AC
  Administered 2013-05-15: 20 mg via INTRAVENOUS

## 2013-05-15 MED ORDER — SODIUM CHLORIDE 0.9 % IV SOLN
80.0000 mg/m2 | Freq: Once | INTRAVENOUS | Status: AC
  Administered 2013-05-15: 186 mg via INTRAVENOUS
  Filled 2013-05-15: qty 31

## 2013-05-15 NOTE — Progress Notes (Signed)
[facility]  Telephone:(336) [phone] Fax:(336) [phone]  OFFICE PROGRESS NOTE  ID: [name]   DOB: 10-31-1975  MR#: [id number]  SWF#:[id number]   PCP: Lanetta Inch L. Sherre Lain SU:  Jovita Kussmaul, MD  RAD ONC:  Arloa Koh, MD  CHIEF COMPLAINT: Ongoing weekly chemotherapy weekly chemotherapy with Taxol,weakness,leg pain  DIAGNOSIS: [name] is a 38 y.o. 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  She was seen in the multidisciplinary breast clinic on 10/23/2012 for discussion of treatment options.   STAGE:  Cancer of lower-inner quadrant of female breast  Primary site: Breast (Left)  Staging method: AJCC 7th Edition  Clinical: Stage IA (T1c, N0, cM0)  Summary: Stage IA (T1c, N0, cM0)   PRIOR THERAPY: #1  A screening mammogram in [place], [place] on 09/23/2012 she was felt to have a new mass within the left breast. An ultrasound on 10/06/2012 showed a 1.6 x 1.3 x 1.3 cm mass at the 8 o'clock position 7 cm from the nipple. At the 12 o'clock position behind and nipple was a circumscribed oval shaped 2 x 1.8 x 1.2 cm lesion perhaps representing a fibroadenoma. 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 The carcinoma was grade 2-3, estrogen receptor 80% positive, progesterone receptor 80% positive, Ki-67 25%, and HER-2/neu negative. Bilateral breast MRI on 10/22/2012 showed biopsy proven carcinoma in the left lower inner quadrant identified and measures approximately 1.9 x 1.9 x 1.8 cm.  Well-defined nodule in the right upper outer quadrant posteriorly consistent with a fibroadenoma. Probable liver cysts.There were multiple scattered nonspecific foci in each breast. No other suspicious abnormality was identified. No abnormal appearing lymph nodes (Clinical stage I, T1 N0).   #2 Status post left  breast lumpectomy with left axillary sentinel lymph node biopsy on 12/10/2012 for a stage IIB, pT2, pN51m, MX, 2.3 cm invasive ductal carcinoma, grade 3, estrogen receptor 80% positive, progesterone receptor 80% positive, Ki-67 25%, HER-2/neu negative, with 1/2 left axillary sentinel lymph nodes positive for microscopic disease.   #3 [place]1 and BRCA2 negative.  #4 Adjuvant chemotherapy consisting of FEC (5-FU/epirubicin/Cytoxan) began on 01/23/2013. She completed 6 cycles of FEC on 04/03/13.  She was started on weekly Taxol beginning 04/17/13.  A total of 12 weekly cycles is planned.    #5  Right lower extremity DVT diagnosed on 03/16/2013, patient was placed on Xarelto 146mBID.  Her dose was changed to 2022maily on 04/10/13.  CURRENT THERAPY:  Weekly taxol  INTERVAL HISTORY: [name] a  38 89o. female who returns today for cycle 5 treatment with weekly Taxol. She is doing fair today.  She is  fatigued.  She continues to take Xarelto 20m37mily  Since diagnosis of DVT.. [name] Kitchen  reports  nose bleeds for several weeks now.She reports nasal congestion  and she uses saline spray frequently.She has history of allergies takes Claritin at times. Today temp is 99.3.Yesterday she reports temp 100 at home.She denies significant dyspnea except with exertion.She denies hemoptysis or chest pain.She denies headache or sinus pain.She has right leg pain at times no numbness or weakness.  MEDICAL HISTORY: Past Medical History  Diagnosis Date  . Urinary incontinence     occasional  . Family history of anesthesia complication     pt's mother  has hx. of post-op N/V  . Hypertension     under control with med., has been on med. since 09/2012  . Anxiety   . Breast cancer     left    ALLERGIES:   Allergies  Allergen Reactions  . Amoxicillin Other (See Comments)    States "it does not work"    MEDICATIONS:  Current  Outpatient Prescriptions  Medication Sig Dispense Refill  . ALPRAZolam (XANAX) 0.25 MG tablet Take 1 tablet (0.25 mg total) by mouth at bedtime as needed for sleep.  30 tablet  0  . clarithromycin (BIAXIN) 500 MG tablet Take 1 tablet (500 mg total) by mouth 2 (two) times daily.  30 tablet  0  . dexamethasone (DECADRON) 4 MG tablet Take 2 tablets by mouth once a day on the day after chemotherapy and then take 2 tablets two times a day for 2 days. Take with food.  30 tablet  1  . HYDROcodone-acetaminophen (NORCO/VICODIN) 5-325 MG per tablet Take 1-2 tablets by mouth every 4 (four) hours as needed.  60 tablet  0  . lidocaine-prilocaine (EMLA) cream Apply topically as needed.  30 g  0  . lisinopril (PRINIVIL,ZESTRIL) 10 MG tablet Take 10 mg by mouth daily.      Marland Kitchen LORazepam (ATIVAN) 0.5 MG tablet Take 1 tablet (0.5 mg total) by mouth every 6 (six) hours as needed (Nausea or vomiting).  30 tablet  0  . omeprazole (PRILOSEC) 20 MG capsule TAKE ONE CAPSULE BY MOUTH EVERY DAY  30 capsule  2  . ondansetron (ZOFRAN) 8 MG tablet Take 1 tablet (8 mg total) by mouth 2 (two) times daily. Take two times a day starting the day after chemo for 2 days. Then take two times a day as needed for nausea or vomiting.  30 tablet  1  . prochlorperazine (COMPAZINE) 10 MG tablet Take 1 tablet (10 mg total) by mouth every 6 (six) hours as needed (Nausea or vomiting).  30 tablet  1  . Rivaroxaban (XARELTO) 20 MG TABS tablet Take 1 tablet (20 mg total) by mouth daily with supper.  30 tablet  6   No current facility-administered medications for this visit.    SURGICAL HISTORY:  Past Surgical History  Procedure Laterality Date  . Cesarean section  1999  . Breast lumpectomy with needle localization and axillary sentinel lymph node bx Left 12/10/2012    Procedure: BREAST LUMPECTOMY WITH NEEDLE LOCALIZATION AND AXILLARY SENTINEL LYMPH NODE BX;  Surgeon: Merrie Roof, MD;  Location: [facility];  Service: General;  Laterality: Left;   Needle loc BCG 7:30  nuc med 9:30    . Tonsillectomy and adenoidectomy      as a teenager  . Cholecystectomy  1999  . Open reduction internal fixation (orif) tibia/fibula fracture Right   . Portacath placement Right 01/09/2013    Procedure: INSERTION PORT-A-CATH;  Surgeon: Merrie Roof, MD;  Location: [facility];  Service: General;  Laterality: Right;    REVIEW OF SYSTEMS:  A 10 point review of systems was completed and is negative except as noted above.    PHYSICAL EXAMINATION: Blood pressure 136/82, pulse 121, temperature 99.3 F (37.4 C), temperature source Oral, resp. rate 20, height 5' 9" (1.753 m), weight 243 lb (110.224 kg). Body mass index is 35.87 kg/(m^2). pulse ox 98% on room air. Repeat pulse is 120 per minute GENERAL: Patient is  in no acute distress HEENT:  Sclerae anicteric.  Oropharynx  clear and moist. No ulcerations or evidence of oropharyngeal candidiasis. Neck is supple. Slight right maxilla/facial swelling. NODES:  No cervical, supraclavicular, or axillary lymphadenopathy palpated.  BREAST EXAM: Status post left breast lumpectomy scar noted no masses appreciated in both the breasts. No bilateral axillary lymphadenopathy was noted LUNGS:  Clear to auscultation bilaterally.  No wheezes or rhonchi. HEART:  Regular rate and rhythm tacycardicNo murmur . ABDOMEN:  Soft, nontender.  Positive, normoactive bowel sounds. No organomegaly palpated. MSK:  No focal spinal tenderness to palpation. Full range of motion bilaterally in the upper extremities. EXTREMITIES:  No peripheral edema.  2+ pedal pulses SKIN:  Clear with no obvious rashes or skin changes. No nail dyscrasia. NEURO:  Nonfocal. Well oriented.  Appropriate affect. ECOG PERFORMANCE STATUS:    LABORATORY DATA: Lab Results  Component Value Date   WBC 7.0 05/15/2013   HGB 11.1* 05/15/2013   HCT 33.3* 05/15/2013   MCV 94.5 05/15/2013   PLT 232 05/15/2013      Chemistry      Component Value  Date/Time   NA 136 05/15/2013 1018   NA 136 01/23/2013 1413   K 3.9 05/15/2013 1018   K 3.7 01/23/2013 1413   CL 102 01/23/2013 1413   CO2 26 05/15/2013 1018   CO2 27 01/23/2013 1413   BUN 13.2 05/15/2013 1018   BUN 12 01/23/2013 1413   CREATININE 0.7 05/15/2013 1018   CREATININE 0.68 01/23/2013 1413      Component Value Date/Time   CALCIUM 9.6 05/15/2013 1018   CALCIUM 9.3 01/23/2013 1413   ALKPHOS 50 05/15/2013 1018   ALKPHOS 74 01/23/2013 1413   AST 20 05/15/2013 1018   AST 27 01/23/2013 1413   ALT 40 05/15/2013 1018   ALT 30 01/23/2013 1413   BILITOT 1.52* 05/15/2013 1018   BILITOT 0.5 01/23/2013 1413      RADIOGRAPHIC STUDI   ASSESSMENT:     #1 Stage II, invasive ductal carcinoma of the left breast, grade 3, estrogen receptor/progesterone receptor positive, Ki-67 25%, HER-2/neu negative. Status post left breast lumpectomy with left axillary sentinel lymph node biopsy on 12/10/2012 with 1 of 2 sentinel nodes revealing microscopic disease. The patient elected not to receive axillary lymph node dissection and will undergo radiation therapy after the completion of chemotherapy.    #2 status post adjuvant chemotherapy on 01/23/2013 consisting of FEC (5-FU/Epirubicin/Cytoxan) with Neulasta support given every 2 weeks for a total of 6 cycles.  This will be followed by adjuvant chemotherapy consisting of Taxol on a weekly basis for 12 weeks starting 04/17/13. Port-A-Cath placement on 01/09/2013, 2D echocardiogram with LVEF of 50% - 55%  and chemotherapy class on 01/12/2013. She underwent a repeat echocardiogram on 03/05/13 that demonstrated an improvement in her LVEF to 55-60%, she was evaluated by Dr. Haroldine Laws on 03/09/13 who cleared her to continue with Epirubicin therapy, and will f/u with her in 3 months.    #3 Right lower extremity DVT on 03/16/13, on Xarelto.  A hypercoagulable panel was drawn and demonstrated a positive lupus anticoagulant.  We will retest this in  12 weeks.  #4 Patient will  proceed with week five of Taxol today.I did express my concern regarding administering chemotherapy today due to the low grade temp but the patient insisted to get her treatment today  #5Continue Zoladex q28 days  PLAN Fever as above,possible sinusitis/upper airway infection.Treat with biaxin 500 mg one tabl twice daily for 15 days.If fever persists will order sinus films.Patient to call for temp>100.  Referral to ENT due to recurrent nose bleeds. Continue Xarelto for DVT. Bilirubin was 2.05 today improved to 1.52 will administer current dose of Taxol and monitor. Persistent tacycardia over the past several weeks ,unclear etiology?respiratory drive.Will order today CT angiogram in light of recent DVT. Will review CT angiogram report today.I        All questions answered. [name] was encouraged to contact us in the interim with any questions or problems  I spent 25 minutes counseling the patient face to face.  The total time spent in the appointment was 30 minutes.  [place] [phone] 05/15/2013, 6:47 PM

## 2013-05-15 NOTE — Patient Instructions (Signed)
[facility] Discharge Instructions for Patients Receiving Chemotherapy  Today you received the following chemotherapy agents Taxol  To help prevent nausea and vomiting after your treatment, we encourage you to take your nausea medication    If you develop nausea and vomiting that is not controlled by your nausea medication, call the clinic.   BELOW ARE SYMPTOMS THAT SHOULD BE REPORTED IMMEDIATELY:  *FEVER GREATER THAN 100.5 F  *CHILLS WITH OR WITHOUT FEVER  NAUSEA AND VOMITING THAT IS NOT CONTROLLED WITH YOUR NAUSEA MEDICATION  *UNUSUAL SHORTNESS OF BREATH  *UNUSUAL BRUISING OR BLEEDING  TENDERNESS IN MOUTH AND THROAT WITH OR WITHOUT PRESENCE OF ULCERS  *URINARY PROBLEMS  *BOWEL PROBLEMS  UNUSUAL RASH Items with * indicate a potential emergency and should be followed up as soon as possible.  Feel free to call the clinic you have any questions or concerns. The clinic phone number is [phone].    

## 2013-05-18 ENCOUNTER — Other Ambulatory Visit: Payer: Self-pay

## 2013-05-18 ENCOUNTER — Telehealth: Payer: Self-pay

## 2013-05-18 ENCOUNTER — Other Ambulatory Visit: Payer: Self-pay | Admitting: Hematology and Oncology

## 2013-05-18 DIAGNOSIS — R040 Epistaxis: Secondary | ICD-10-CM

## 2013-05-18 DIAGNOSIS — C50319 Malignant neoplasm of lower-inner quadrant of unspecified female breast: Secondary | ICD-10-CM

## 2013-05-18 NOTE — Addendum Note (Signed)
Addended by: Amada Kingfisher on: 05/18/2013 02:49 PM   Modules accepted: Orders

## 2013-05-18 NOTE — Telephone Encounter (Signed)
Called patient about fevers she was having last week.  She stated she is no longer having any fevers and is feeling better.  Advised her that Dr Owens Loffler would like her to get another CT scan in 2 weeks and scheduling would be calling her with the appointment.  Also advised to call back with any questions or concerns.  POF filled out and sent to scheduling.

## 2013-05-21 ENCOUNTER — Telehealth: Payer: Self-pay | Admitting: Oncology

## 2013-05-21 NOTE — Telephone Encounter (Signed)
, °

## 2013-05-22 ENCOUNTER — Other Ambulatory Visit: Payer: Self-pay | Admitting: Hematology and Oncology

## 2013-05-22 ENCOUNTER — Other Ambulatory Visit: Payer: Self-pay | Admitting: *Deleted

## 2013-05-22 ENCOUNTER — Encounter: Payer: Self-pay | Admitting: Adult Health

## 2013-05-22 ENCOUNTER — Other Ambulatory Visit (HOSPITAL_BASED_OUTPATIENT_CLINIC_OR_DEPARTMENT_OTHER): Payer: BC Managed Care – PPO

## 2013-05-22 ENCOUNTER — Ambulatory Visit (HOSPITAL_BASED_OUTPATIENT_CLINIC_OR_DEPARTMENT_OTHER): Payer: BC Managed Care – PPO

## 2013-05-22 ENCOUNTER — Ambulatory Visit (HOSPITAL_BASED_OUTPATIENT_CLINIC_OR_DEPARTMENT_OTHER): Payer: BC Managed Care – PPO | Admitting: Adult Health

## 2013-05-22 VITALS — BP 122/84 | HR 114 | Temp 98.2°F | Resp 18 | Ht 69.0 in | Wt 239.1 lb

## 2013-05-22 DIAGNOSIS — C50319 Malignant neoplasm of lower-inner quadrant of unspecified female breast: Secondary | ICD-10-CM

## 2013-05-22 DIAGNOSIS — Z170 Estrogen receptor positive status [ER+]: Secondary | ICD-10-CM

## 2013-05-22 DIAGNOSIS — C50312 Malignant neoplasm of lower-inner quadrant of left female breast: Secondary | ICD-10-CM

## 2013-05-22 DIAGNOSIS — M899 Disorder of bone, unspecified: Secondary | ICD-10-CM

## 2013-05-22 DIAGNOSIS — M949 Disorder of cartilage, unspecified: Secondary | ICD-10-CM

## 2013-05-22 DIAGNOSIS — Z5111 Encounter for antineoplastic chemotherapy: Secondary | ICD-10-CM

## 2013-05-22 DIAGNOSIS — R000 Tachycardia, unspecified: Secondary | ICD-10-CM

## 2013-05-22 DIAGNOSIS — I82409 Acute embolism and thrombosis of unspecified deep veins of unspecified lower extremity: Secondary | ICD-10-CM

## 2013-05-22 LAB — COMPREHENSIVE METABOLIC PANEL (CC13)
ALK PHOS: 44 U/L (ref 40–150)
ALT: 36 U/L (ref 0–55)
AST: 13 U/L (ref 5–34)
Albumin: 3.3 g/dL — ABNORMAL LOW (ref 3.5–5.0)
Anion Gap: 10 mEq/L (ref 3–11)
BUN: 24.6 mg/dL (ref 7.0–26.0)
CO2: 25 mEq/L (ref 22–29)
Calcium: 9.2 mg/dL (ref 8.4–10.4)
Chloride: 102 mEq/L (ref 98–109)
Creatinine: 0.7 mg/dL (ref 0.6–1.1)
Glucose: 107 mg/dl (ref 70–140)
Potassium: 3.8 mEq/L (ref 3.5–5.1)
SODIUM: 137 meq/L (ref 136–145)
Total Bilirubin: 0.77 mg/dL (ref 0.20–1.20)
Total Protein: 5.8 g/dL — ABNORMAL LOW (ref 6.4–8.3)

## 2013-05-22 LAB — CBC WITH DIFFERENTIAL/PLATELET
BASO%: 0.7 % (ref 0.0–2.0)
Basophils Absolute: 0.1 10*3/uL (ref 0.0–0.1)
EOS%: 0.2 % (ref 0.0–7.0)
Eosinophils Absolute: 0 10*3/uL (ref 0.0–0.5)
HCT: 34.3 % — ABNORMAL LOW (ref 34.8–46.6)
HEMOGLOBIN: 11.5 g/dL — AB (ref 11.6–15.9)
LYMPH%: 19 % (ref 14.0–49.7)
MCH: 31 pg (ref 25.1–34.0)
MCHC: 33.5 g/dL (ref 31.5–36.0)
MCV: 92.5 fL (ref 79.5–101.0)
MONO#: 0.6 10*3/uL (ref 0.1–0.9)
MONO%: 6.3 % (ref 0.0–14.0)
NEUT#: 6.9 10*3/uL — ABNORMAL HIGH (ref 1.5–6.5)
NEUT%: 73.8 % (ref 38.4–76.8)
Platelets: 226 10*3/uL (ref 145–400)
RBC: 3.71 10*6/uL (ref 3.70–5.45)
RDW: 17.6 % — AB (ref 11.2–14.5)
WBC: 9.4 10*3/uL (ref 3.9–10.3)
lymph#: 1.8 10*3/uL (ref 0.9–3.3)
nRBC: 3 % — ABNORMAL HIGH (ref 0–0)

## 2013-05-22 LAB — TECHNOLOGIST REVIEW

## 2013-05-22 MED ORDER — ALPRAZOLAM 0.25 MG PO TABS: 0.2500 mg | ORAL_TABLET | Freq: Every evening | ORAL | Status: DC | PRN

## 2013-05-22 MED ORDER — DIPHENHYDRAMINE HCL 50 MG/ML IJ SOLN
INTRAMUSCULAR | Status: AC
  Filled 2013-05-22: qty 1

## 2013-05-22 MED ORDER — DEXAMETHASONE SODIUM PHOSPHATE 20 MG/5ML IJ SOLN
20.0000 mg | Freq: Once | INTRAMUSCULAR | Status: AC
  Administered 2013-05-22: 20 mg via INTRAVENOUS

## 2013-05-22 MED ORDER — ONDANSETRON 8 MG/50ML IVPB (CHCC)
8.0000 mg | Freq: Once | INTRAVENOUS | Status: AC
  Administered 2013-05-22: 8 mg via INTRAVENOUS

## 2013-05-22 MED ORDER — DIPHENHYDRAMINE HCL 50 MG/ML IJ SOLN
50.0000 mg | Freq: Once | INTRAMUSCULAR | Status: AC
  Administered 2013-05-22: 50 mg via INTRAVENOUS

## 2013-05-22 MED ORDER — SODIUM CHLORIDE 0.9 % IV SOLN
80.0000 mg/m2 | Freq: Once | INTRAVENOUS | Status: AC
  Administered 2013-05-22: 186 mg via INTRAVENOUS
  Filled 2013-05-22: qty 31

## 2013-05-22 MED ORDER — SODIUM CHLORIDE 0.9 % IJ SOLN
10.0000 mL | INTRAMUSCULAR | Status: DC | PRN
  Administered 2013-05-22: 10 mL
  Filled 2013-05-22: qty 10

## 2013-05-22 MED ORDER — FAMOTIDINE IN NACL 20-0.9 MG/50ML-% IV SOLN
20.0000 mg | Freq: Once | INTRAVENOUS | Status: AC
  Administered 2013-05-22: 20 mg via INTRAVENOUS

## 2013-05-22 MED ORDER — ONDANSETRON 8 MG/NS 50 ML IVPB
INTRAVENOUS | Status: AC
  Filled 2013-05-22: qty 8

## 2013-05-22 MED ORDER — HEPARIN SOD (PORK) LOCK FLUSH 100 UNIT/ML IV SOLN
500.0000 [IU] | Freq: Once | INTRAVENOUS | Status: AC | PRN
  Administered 2013-05-22: 500 [IU]
  Filled 2013-05-22: qty 5

## 2013-05-22 MED ORDER — FAMOTIDINE IN NACL 20-0.9 MG/50ML-% IV SOLN
INTRAVENOUS | Status: AC
  Filled 2013-05-22: qty 50

## 2013-05-22 MED ORDER — HYDROCODONE-ACETAMINOPHEN 5-325 MG PO TABS: 1.0000 | ORAL_TABLET | ORAL | Status: DC | PRN

## 2013-05-22 MED ORDER — DEXAMETHASONE SODIUM PHOSPHATE 20 MG/5ML IJ SOLN
INTRAMUSCULAR | Status: AC
  Filled 2013-05-22: qty 5

## 2013-05-22 MED ORDER — SODIUM CHLORIDE 0.9 % IV SOLN
Freq: Once | INTRAVENOUS | Status: AC
  Administered 2013-05-22: 14:00:00 via INTRAVENOUS

## 2013-05-22 NOTE — Patient Instructions (Signed)
[facility] Discharge Instructions for Patients Receiving Chemotherapy  Today you received the following chemotherapy agents: Taxol.  To help prevent nausea and vomiting after your treatment, we encourage you to take your nausea medication as prescribed.   If you develop nausea and vomiting that is not controlled by your nausea medication, call the clinic.   BELOW ARE SYMPTOMS THAT SHOULD BE REPORTED IMMEDIATELY:  *FEVER GREATER THAN 100.5 F  *CHILLS WITH OR WITHOUT FEVER  NAUSEA AND VOMITING THAT IS NOT CONTROLLED WITH YOUR NAUSEA MEDICATION  *UNUSUAL SHORTNESS OF BREATH  *UNUSUAL BRUISING OR BLEEDING  TENDERNESS IN MOUTH AND THROAT WITH OR WITHOUT PRESENCE OF ULCERS  *URINARY PROBLEMS  *BOWEL PROBLEMS  UNUSUAL RASH Items with * indicate a potential emergency and should be followed up as soon as possible.  Feel free to call the clinic you have any questions or concerns. The clinic phone number is [phone].    

## 2013-05-22 NOTE — Progress Notes (Signed)
[name]  [phone] Fax:(336) [phone]  OFFICE PROGRESS NOTE  ID: [name]   DOB: [birth year]  MR#: [id number]  RFX#:[id number]   PCP: [name] SU:  Theresa Kussmaul, MD  RAD ONC:  Theresa Koh, MD  CHIEF COMPLAINT: Ongoing weekly chemotherapy weekly chemotherapy with Taxol,weakness,leg pain  DIAGNOSIS: [name] is a 38 y.o. 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  She was seen in the multidisciplinary breast clinic on 10/23/2012 for discussion of treatment options.   STAGE:  Cancer of lower-inner quadrant of female breast  Primary site: Breast (Left)  Staging method: AJCC 7th Edition  Clinical: Stage IA (T1c, N0, cM0)  Summary: Stage IA (T1c, N0, cM0)   PRIOR THERAPY: #1  A screening mammogram in [place], [place] on 09/23/2012 she was felt to have a new mass within the left breast. An ultrasound on 10/06/2012 showed a 1.6 x 1.3 x 1.3 cm mass at the 8 o'clock position 7 cm from the nipple. At the 12 o'clock position behind and nipple was a circumscribed oval shaped 2 x 1.8 x 1.2 cm lesion perhaps representing a fibroadenoma. 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 The carcinoma was grade 2-3, estrogen receptor 80% positive, progesterone receptor 80% positive, Ki-67 25%, and HER-2/neu negative. Bilateral breast MRI on 10/22/2012 showed biopsy proven carcinoma in the left lower inner quadrant identified and measures approximately 1.9 x 1.9 x 1.8 cm.  Well-defined nodule in the right upper outer quadrant posteriorly consistent with a fibroadenoma. Probable liver cysts.There were multiple scattered nonspecific foci in each breast. No other suspicious abnormality was identified. No abnormal appearing lymph nodes (Clinical stage I, T1 N0).   #2 Status post left  breast lumpectomy with left axillary sentinel lymph node biopsy on 12/10/2012 for a stage IIB, pT2, pN6m, MX, 2.3 cm invasive ductal carcinoma, grade 3, estrogen receptor 80% positive, progesterone receptor 80% positive, Ki-67 25%, HER-2/neu negative, with 1/2 left axillary sentinel lymph nodes positive for microscopic disease.   #3 [place]1 and BRCA2 negative.  #4 Adjuvant chemotherapy consisting of FEC (5-FU/epirubicin/Cytoxan) began on 01/23/2013. She completed 6 cycles of FEC on 04/03/13.  She was started on weekly Taxol beginning 04/17/13.  A total of 12 weekly cycles is planned.    #5  Right lower extremity DVT diagnosed on 03/16/2013, patient was placed on Xarelto 167mBID.  Her dose was changed to 2094maily on 04/10/13.  CURRENT THERAPY:  Weekly taxol  INTERVAL HISTORY: [name] a  38 71o. female who returns today for cycle 6 of weekly Taxol.  She continues to do well on Xarelto.  She has had some epistaxis for a couple of weeks.  She has been doing saline nasal spray two to three times per day and it is much improved.  She did have an appointment set up with ENT last week that she would like to cancel.  She noticed that her slight tachycardia and mild bone pain started when she began Zoladex.  She is doing well otherwise.  She denies fevers, chills, nausea, vomiting, constipation, diarrhea, numbness, mucositis, or any further concerns.    MEDICAL HISTORY: Past Medical History  Diagnosis Date  . Urinary incontinence     occasional  . Family history of anesthesia  complication     pt's mother has hx. of post-op N/V  . Hypertension     under control with med., has been on med. since 09/2012  . Anxiety   . Breast cancer     left    ALLERGIES:   Allergies  Allergen Reactions  . Amoxicillin Other (See Comments)    States "it does not work"    MEDICATIONS:  Current Outpatient Prescriptions   Medication Sig Dispense Refill  . clarithromycin (BIAXIN) 500 MG tablet Take 1 tablet (500 mg total) by mouth 2 (two) times daily.  30 tablet  0  . lidocaine-prilocaine (EMLA) cream Apply topically as needed.  30 g  0  . lisinopril (PRINIVIL,ZESTRIL) 10 MG tablet Take 10 mg by mouth daily.      Marland Kitchen LORazepam (ATIVAN) 0.5 MG tablet Take 1 tablet (0.5 mg total) by mouth every 6 (six) hours as needed (Nausea or vomiting).  30 tablet  0  . omeprazole (PRILOSEC) 20 MG capsule TAKE ONE CAPSULE BY MOUTH EVERY DAY  30 capsule  2  . Rivaroxaban (XARELTO) 20 MG TABS tablet Take 1 tablet (20 mg total) by mouth daily with supper.  30 tablet  6  . ALPRAZolam (XANAX) 0.25 MG tablet Take 1 tablet (0.25 mg total) by mouth at bedtime as needed for sleep.  30 tablet  0  . dexamethasone (DECADRON) 4 MG tablet Take 2 tablets by mouth once a day on the day after chemotherapy and then take 2 tablets two times a day for 2 days. Take with food.  30 tablet  1  . HYDROcodone-acetaminophen (NORCO/VICODIN) 5-325 MG per tablet Take 1-2 tablets by mouth every 4 (four) hours as needed.  60 tablet  0  . ondansetron (ZOFRAN) 8 MG tablet Take 1 tablet (8 mg total) by mouth 2 (two) times daily. Take two times a day starting the day after chemo for 2 days. Then take two times a day as needed for nausea or vomiting.  30 tablet  1  . prochlorperazine (COMPAZINE) 10 MG tablet Take 1 tablet (10 mg total) by mouth every 6 (six) hours as needed (Nausea or vomiting).  30 tablet  1   No current facility-administered medications for this visit.    SURGICAL HISTORY:  Past Surgical History  Procedure Laterality Date  . Cesarean section  1999  . Breast lumpectomy with needle localization and axillary sentinel lymph node bx Left 12/10/2012    Procedure: BREAST LUMPECTOMY WITH NEEDLE LOCALIZATION AND AXILLARY SENTINEL LYMPH NODE BX;  Surgeon: Merrie Roof, MD;  Location: [facility];  Service: General;  Laterality: Left;  Needle loc BCG 7:30  nuc  med 9:30    . Tonsillectomy and adenoidectomy      as a teenager  . Cholecystectomy  1999  . Open reduction internal fixation (orif) tibia/fibula fracture Right   . Portacath placement Right 01/09/2013    Procedure: INSERTION PORT-A-CATH;  Surgeon: Merrie Roof, MD;  Location: [facility];  Service: General;  Laterality: Right;    REVIEW OF SYSTEMS:  A 10 point review of systems was completed and is negative except as noted above.   PHYSICAL EXAMINATION: Blood pressure 122/84, pulse 114, temperature 98.2 F (36.8 C), temperature source Oral, resp. rate 18, height 5' 9" (1.753 m), weight 239 lb 1.6 oz (108.455 kg). Body mass index is 35.29 kg/(m^2).  GENERAL: Patient is  in no acute distress HEENT:  Sclerae anicteric.  Oropharynx clear and moist.  No ulcerations or evidence of oropharyngeal candidiasis. Neck is supple.  NODES:  No cervical, supraclavicular, or axillary lymphadenopathy palpated.  BREAST EXAM: Status post left breast lumpectomy scar noted no masses appreciated in both the breasts. No bilateral axillary lymphadenopathy was noted LUNGS:  Clear to auscultation bilaterally.  No wheezes or rhonchi. HEART:  Regular rate and rhythm tacycardicNo murmur . ABDOMEN:  Soft, nontender.  Positive, normoactive bowel sounds. No organomegaly palpated. MSK:  No focal spinal tenderness to palpation. Full range of motion bilaterally in the upper extremities. EXTREMITIES:  No peripheral edema.  2+ pedal pulses SKIN:  Clear with no obvious rashes or skin changes. No nail dyscrasia. NEURO:  Nonfocal. Well oriented.  Appropriate affect. ECOG PERFORMANCE STATUS:    LABORATORY DATA: Lab Results  Component Value Date   WBC 9.4 05/22/2013   HGB 11.5* 05/22/2013   HCT 34.3* 05/22/2013   MCV 92.5 05/22/2013   PLT 226 05/22/2013      Chemistry      Component Value Date/Time   NA 137 05/22/2013 1256   NA 136 01/23/2013 1413   K 3.8 05/22/2013 1256   K 3.7 01/23/2013 1413   CL 102 01/23/2013  1413   CO2 25 05/22/2013 1256   CO2 27 01/23/2013 1413   BUN 24.6 05/22/2013 1256   BUN 12 01/23/2013 1413   CREATININE 0.7 05/22/2013 1256   CREATININE 0.68 01/23/2013 1413      Component Value Date/Time   CALCIUM 9.2 05/22/2013 1256   CALCIUM 9.3 01/23/2013 1413   ALKPHOS 44 05/22/2013 1256   ALKPHOS 74 01/23/2013 1413   AST 13 05/22/2013 1256   AST 27 01/23/2013 1413   ALT 36 05/22/2013 1256   ALT 30 01/23/2013 1413   BILITOT 0.77 05/22/2013 1256   BILITOT 0.5 01/23/2013 1413      RADIOGRAPHIC STUDI   ASSESSMENT:     #1 Stage II, invasive ductal carcinoma of the left breast, grade 3, estrogen receptor/progesterone receptor positive, Ki-67 25%, HER-2/neu negative. Status post left breast lumpectomy with left axillary sentinel lymph node biopsy on 12/10/2012 with 1 of 2 sentinel nodes revealing microscopic disease. The patient elected not to receive axillary lymph node dissection and will undergo radiation therapy after the completion of chemotherapy.    #2 status post adjuvant chemotherapy on 01/23/2013 consisting of FEC (5-FU/Epirubicin/Cytoxan) with Neulasta support given every 2 weeks for a total of 6 cycles.  This will be followed by adjuvant chemotherapy consisting of Taxol on a weekly basis for 12 weeks starting 04/17/13. Port-A-Cath placement on 01/09/2013, 2D echocardiogram with LVEF of 50% - 55%  and chemotherapy class on 01/12/2013. She underwent a repeat echocardiogram on 03/05/13 that demonstrated an improvement in her LVEF to 55-60%, she was evaluated by Dr. Haroldine Laws on 03/09/13 who cleared her to continue with Epirubicin therapy, and will f/u with her in 3 months.    #3 Right lower extremity DVT on 03/16/13, on Xarelto.  A hypercoagulable panel was drawn and demonstrated a positive lupus anticoagulant.  We will retest this in 12 weeks.  #4 Patient will proceed with week five of Taxol today.I did express my concern regarding administering chemotherapy today due to the low grade temp but the  patient insisted to get her treatment today  #5Continue Zoladex q28 days  PLAN:  1.  Patient is doing well today.  She will proceed with weekly Taxol as soon as her CMP has returned.  She has a mild hyperbilirubinemia that we are monitoring.  2.  Her epistaxis has essentially resolved with saline nasal spray two to three times per day.    3.  She receives Zoladex every 28 days, which she attributes to her tachycardia and bone/joint pain.  She will check her HR at home and keep a record to bring in to her next appointment.    4.  She will return in one week for labs, evaluation and week 7 of Taxol chemotherapy.  We will continue to monitor her closely for peripheral neuropathy.    All questions answered. Ms. Franko was encouraged to contact us in the interim with any questions or problems  I spent 25 minutes counseling the patient face to face.  The total time spent in the appointment was 30 minutes.  Minette Headland, [place] [phone] 05/24/2013, 9:02 AM

## 2013-05-29 ENCOUNTER — Ambulatory Visit (HOSPITAL_BASED_OUTPATIENT_CLINIC_OR_DEPARTMENT_OTHER): Payer: BC Managed Care – PPO

## 2013-05-29 ENCOUNTER — Other Ambulatory Visit (HOSPITAL_BASED_OUTPATIENT_CLINIC_OR_DEPARTMENT_OTHER): Payer: BC Managed Care – PPO

## 2013-05-29 ENCOUNTER — Encounter: Payer: Self-pay | Admitting: Oncology

## 2013-05-29 ENCOUNTER — Ambulatory Visit (HOSPITAL_BASED_OUTPATIENT_CLINIC_OR_DEPARTMENT_OTHER): Payer: BC Managed Care – PPO | Admitting: Oncology

## 2013-05-29 VITALS — BP 118/81 | HR 94 | Temp 98.1°F | Resp 18 | Ht 69.0 in | Wt 241.4 lb

## 2013-05-29 DIAGNOSIS — Z170 Estrogen receptor positive status [ER+]: Secondary | ICD-10-CM

## 2013-05-29 DIAGNOSIS — D649 Anemia, unspecified: Secondary | ICD-10-CM

## 2013-05-29 DIAGNOSIS — C50312 Malignant neoplasm of lower-inner quadrant of left female breast: Secondary | ICD-10-CM

## 2013-05-29 DIAGNOSIS — C50319 Malignant neoplasm of lower-inner quadrant of unspecified female breast: Secondary | ICD-10-CM

## 2013-05-29 DIAGNOSIS — I82409 Acute embolism and thrombosis of unspecified deep veins of unspecified lower extremity: Secondary | ICD-10-CM

## 2013-05-29 DIAGNOSIS — Z5111 Encounter for antineoplastic chemotherapy: Secondary | ICD-10-CM

## 2013-05-29 LAB — COMPREHENSIVE METABOLIC PANEL (CC13)
ALBUMIN: 3.4 g/dL — AB (ref 3.5–5.0)
ALK PHOS: 40 U/L (ref 40–150)
ALT: 38 U/L (ref 0–55)
AST: 11 U/L (ref 5–34)
Anion Gap: 10 mEq/L (ref 3–11)
BUN: 26.7 mg/dL — AB (ref 7.0–26.0)
CALCIUM: 9.5 mg/dL (ref 8.4–10.4)
CO2: 25 mEq/L (ref 22–29)
CREATININE: 0.8 mg/dL (ref 0.6–1.1)
Chloride: 100 mEq/L (ref 98–109)
GLUCOSE: 157 mg/dL — AB (ref 70–140)
POTASSIUM: 4.1 meq/L (ref 3.5–5.1)
Sodium: 134 mEq/L — ABNORMAL LOW (ref 136–145)
Total Bilirubin: 1.02 mg/dL (ref 0.20–1.20)
Total Protein: 5.8 g/dL — ABNORMAL LOW (ref 6.4–8.3)

## 2013-05-29 LAB — CBC WITH DIFFERENTIAL/PLATELET
BASO%: 0.9 % (ref 0.0–2.0)
BASOS ABS: 0.1 10*3/uL (ref 0.0–0.1)
EOS%: 0.3 % (ref 0.0–7.0)
Eosinophils Absolute: 0 10*3/uL (ref 0.0–0.5)
HEMATOCRIT: 34.6 % — AB (ref 34.8–46.6)
HEMOGLOBIN: 11.5 g/dL — AB (ref 11.6–15.9)
LYMPH%: 21.8 % (ref 14.0–49.7)
MCH: 31.1 pg (ref 25.1–34.0)
MCHC: 33.2 g/dL (ref 31.5–36.0)
MCV: 93.5 fL (ref 79.5–101.0)
MONO#: 0.9 10*3/uL (ref 0.1–0.9)
MONO%: 11.7 % (ref 0.0–14.0)
NEUT#: 5.1 10*3/uL (ref 1.5–6.5)
NEUT%: 65.3 % (ref 38.4–76.8)
PLATELETS: 173 10*3/uL (ref 145–400)
RBC: 3.7 10*6/uL (ref 3.70–5.45)
RDW: 18.4 % — ABNORMAL HIGH (ref 11.2–14.5)
WBC: 7.8 10*3/uL (ref 3.9–10.3)
lymph#: 1.7 10*3/uL (ref 0.9–3.3)
nRBC: 3 % — ABNORMAL HIGH (ref 0–0)

## 2013-05-29 LAB — TECHNOLOGIST REVIEW

## 2013-05-29 MED ORDER — SODIUM CHLORIDE 0.9 % IV SOLN
Freq: Once | INTRAVENOUS | Status: AC
  Administered 2013-05-29: 12:00:00 via INTRAVENOUS

## 2013-05-29 MED ORDER — DEXAMETHASONE SODIUM PHOSPHATE 20 MG/5ML IJ SOLN
INTRAMUSCULAR | Status: AC
  Filled 2013-05-29: qty 5

## 2013-05-29 MED ORDER — DIPHENHYDRAMINE HCL 50 MG/ML IJ SOLN
50.0000 mg | Freq: Once | INTRAMUSCULAR | Status: AC
  Administered 2013-05-29: 50 mg via INTRAVENOUS

## 2013-05-29 MED ORDER — ONDANSETRON 8 MG/50ML IVPB (CHCC)
8.0000 mg | Freq: Once | INTRAVENOUS | Status: AC
  Administered 2013-05-29: 8 mg via INTRAVENOUS

## 2013-05-29 MED ORDER — FAMOTIDINE IN NACL 20-0.9 MG/50ML-% IV SOLN
INTRAVENOUS | Status: AC
  Filled 2013-05-29: qty 50

## 2013-05-29 MED ORDER — SODIUM CHLORIDE 0.9 % IJ SOLN
10.0000 mL | INTRAMUSCULAR | Status: DC | PRN
  Administered 2013-05-29: 10 mL
  Filled 2013-05-29: qty 10

## 2013-05-29 MED ORDER — SODIUM CHLORIDE 0.9 % IV SOLN
80.0000 mg/m2 | Freq: Once | INTRAVENOUS | Status: AC
  Administered 2013-05-29: 186 mg via INTRAVENOUS
  Filled 2013-05-29: qty 31

## 2013-05-29 MED ORDER — DEXAMETHASONE SODIUM PHOSPHATE 20 MG/5ML IJ SOLN
20.0000 mg | Freq: Once | INTRAMUSCULAR | Status: AC
  Administered 2013-05-29: 20 mg via INTRAVENOUS

## 2013-05-29 MED ORDER — HEPARIN SOD (PORK) LOCK FLUSH 100 UNIT/ML IV SOLN
500.0000 [IU] | Freq: Once | INTRAVENOUS | Status: AC | PRN
  Administered 2013-05-29: 500 [IU]
  Filled 2013-05-29: qty 5

## 2013-05-29 MED ORDER — FAMOTIDINE IN NACL 20-0.9 MG/50ML-% IV SOLN
20.0000 mg | Freq: Once | INTRAVENOUS | Status: AC
  Administered 2013-05-29: 20 mg via INTRAVENOUS

## 2013-05-29 MED ORDER — ONDANSETRON 8 MG/NS 50 ML IVPB
INTRAVENOUS | Status: AC
  Filled 2013-05-29: qty 8

## 2013-05-29 MED ORDER — DIPHENHYDRAMINE HCL 50 MG/ML IJ SOLN
INTRAMUSCULAR | Status: AC
  Filled 2013-05-29: qty 1

## 2013-05-29 NOTE — Patient Instructions (Signed)
[facility] Discharge Instructions for Patients Receiving Chemotherapy  Today you received the following chemotherapy agents Taxol  To help prevent nausea and vomiting after your treatment, we encourage you to take your nausea medication    If you develop nausea and vomiting that is not controlled by your nausea medication, call the clinic.   BELOW ARE SYMPTOMS THAT SHOULD BE REPORTED IMMEDIATELY:  *FEVER GREATER THAN 100.5 F  *CHILLS WITH OR WITHOUT FEVER  NAUSEA AND VOMITING THAT IS NOT CONTROLLED WITH YOUR NAUSEA MEDICATION  *UNUSUAL SHORTNESS OF BREATH  *UNUSUAL BRUISING OR BLEEDING  TENDERNESS IN MOUTH AND THROAT WITH OR WITHOUT PRESENCE OF ULCERS  *URINARY PROBLEMS  *BOWEL PROBLEMS  UNUSUAL RASH Items with * indicate a potential emergency and should be followed up as soon as possible.  Feel free to call the clinic you have any questions or concerns. The clinic phone number is [phone].    

## 2013-05-29 NOTE — Progress Notes (Signed)
[place]  Telephone:([phone]) [phone] Fax:(336) [phone]  OFFICE PROGRESS NOTE  ID: [name]   DOB: 1975-09-27  MR#: [id number]  NIO#:[id number]   PCP: Stacie L. Sherre Lain SU:  Jovita Kussmaul, MD  RAD ONC:  Arloa Koh, MD  CHIEF COMPLAINT: Ongoing weekly chemotherapy weekly chemotherapy with Taxol,weakness,leg pain  DIAGNOSIS: [name] is a 38 y.o. 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  She was seen in the multidisciplinary breast clinic on 10/23/2012 for discussion of treatment options.   STAGE:  Cancer of lower-inner quadrant of female breast  Primary site: Breast (Left)  Staging method: AJCC 7th Edition  Clinical: Stage IA (T1c, N0, cM0)  Summary: Stage IA (T1c, N0, cM0)   PRIOR THERAPY: #1  A screening mammogram in [place], [place] on 09/23/2012 she was felt to have a new mass within the left breast. An ultrasound on 10/06/2012 showed a 1.6 x 1.3 x 1.3 cm mass at the 8 o'clock position 7 cm from the nipple. At the 12 o'clock position behind and nipple was a circumscribed oval shaped 2 x 1.8 x 1.2 cm lesion perhaps representing a fibroadenoma. 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 The carcinoma was grade 2-3, estrogen receptor 80% positive, progesterone receptor 80% positive, Ki-67 25%, and HER-2/neu negative. Bilateral breast MRI on 10/22/2012 showed biopsy proven carcinoma in the left lower inner quadrant identified and measures approximately 1.9 x 1.9 x 1.8 cm.  Well-defined nodule in the right upper outer quadrant posteriorly consistent with a fibroadenoma. Probable liver cysts.There were multiple scattered nonspecific foci in each breast. No other suspicious abnormality was identified. No abnormal appearing lymph nodes (Clinical stage I, T1 N0).   #2 Status post left  breast lumpectomy with left axillary sentinel lymph node biopsy on 12/10/2012 for a stage IIB, pT2, pN4m, MX, 2.3 cm invasive ductal carcinoma, grade 3, estrogen receptor 80% positive, progesterone receptor 80% positive, Ki-67 25%, HER-2/neu negative, with 1/2 left axillary sentinel lymph nodes positive for microscopic disease.   #3 [place]1 and BRCA2 negative.  #4 Adjuvant chemotherapy consisting of FEC (5-FU/epirubicin/Cytoxan) began on 01/23/2013. She completed 6 cycles of FEC on 04/03/13.  She was started on weekly Taxol beginning 04/17/13.  A total of 12 weekly cycles is planned.    #5  Right lower extremity DVT diagnosed on 03/16/2013, patient was placed on Xarelto 169mBID.  Her dose was changed to 2020maily on 04/10/13.  CURRENT THERAPY:  Here for cycle 7 of taxol  INTERVAL HISTORY: [name] a  38 7o. female who returns today for cycle 7 of weekly Taxol. Overall she feels well. She is experiencing some neuropathic type of pain but it is grade 1. Since starting chemotherapy patient has gained weight. But she is hoping.once she finishes up her treatments she will begin to lose this. Her vitals are stable. She is no longer tachycardic blood pressure is stable. She is anemic with a hemoglobin of 11.5 but this is most likely due to her chemotherapy. Platelets and white cells are normal. Today she denies any headaches double vision blurring of vision fevers chills night sweats. No shortness of breath chest pains palpitations. No abdominal pain no diarrhea or constipation. She has no easy bruising or bleeding. She has no myalgias and  arthralgias. No peripheral paresthesias or gait disturbances. Remainder of the 10 point review of systems is negative.   MEDICAL HISTORY: Past Medical History  Diagnosis Date  . Urinary incontinence     occasional  . Family history of anesthesia complication     pt's mother  has hx. of post-op N/V  . Hypertension     under control with med., has been on med. since 09/2012  . Anxiety   . Breast cancer     left    ALLERGIES:   Allergies  Allergen Reactions  . Amoxicillin Other (See Comments)    States "it does not work"    MEDICATIONS:  Current Outpatient Prescriptions  Medication Sig Dispense Refill  . ALPRAZolam (XANAX) 0.25 MG tablet Take 1 tablet (0.25 mg total) by mouth at bedtime as needed for sleep.  30 tablet  0  . dexamethasone (DECADRON) 4 MG tablet Take 2 tablets by mouth once a day on the day after chemotherapy and then take 2 tablets two times a day for 2 days. Take with food.  30 tablet  1  . HYDROcodone-acetaminophen (NORCO/VICODIN) 5-325 MG per tablet Take 1-2 tablets by mouth every 4 (four) hours as needed.  60 tablet  0  . lidocaine-prilocaine (EMLA) cream Apply topically as needed.  30 g  0  . lisinopril (PRINIVIL,ZESTRIL) 10 MG tablet Take 10 mg by mouth daily.      Marland Kitchen LORazepam (ATIVAN) 0.5 MG tablet Take 1 tablet (0.5 mg total) by mouth every 6 (six) hours as needed (Nausea or vomiting).  30 tablet  0  . omeprazole (PRILOSEC) 20 MG capsule TAKE ONE CAPSULE BY MOUTH EVERY DAY  30 capsule  2  . ondansetron (ZOFRAN) 8 MG tablet Take 1 tablet (8 mg total) by mouth 2 (two) times daily. Take two times a day starting the day after chemo for 2 days. Then take two times a day as needed for nausea or vomiting.  30 tablet  1  . prochlorperazine (COMPAZINE) 10 MG tablet Take 1 tablet (10 mg total) by mouth every 6 (six) hours as needed (Nausea or vomiting).  30 tablet  1  . Rivaroxaban (XARELTO) 20 MG TABS tablet Take 1 tablet (20 mg total) by mouth daily with supper.  30 tablet  6  . clarithromycin (BIAXIN) 500 MG tablet Take 1 tablet (500 mg total) by mouth 2 (two) times daily.  30 tablet  0   No current facility-administered medications for this visit.    SURGICAL HISTORY:  Past Surgical History  Procedure Laterality Date  . Cesarean section   1999  . Breast lumpectomy with needle localization and axillary sentinel lymph node bx Left 12/10/2012    Procedure: BREAST LUMPECTOMY WITH NEEDLE LOCALIZATION AND AXILLARY SENTINEL LYMPH NODE BX;  Surgeon: Merrie Roof, MD;  Location: [facility];  Service: General;  Laterality: Left;  Needle loc BCG 7:30  nuc med 9:30    . Tonsillectomy and adenoidectomy      as a teenager  . Cholecystectomy  1999  . Open reduction internal fixation (orif) tibia/fibula fracture Right   . Portacath placement Right 01/09/2013    Procedure: INSERTION PORT-A-CATH;  Surgeon: Merrie Roof, MD;  Location: [place];  Service: General;  Laterality: Right;    REVIEW OF SYSTEMS:  A 10 point review of systems was completed and is negative except as noted above.   PHYSICAL EXAMINATION: Blood pressure 118/81, pulse 94, temperature 98.1 F (36.7 C),  temperature source Oral, resp. rate 18, height _0  (1.753 m), weight 241 lb 6.4 oz (109.498 kg). Body mass index is 35.63 kg/(m^2).  GENERAL: Patient is  in no acute distress HEENT:  Sclerae anicteric.  Oropharynx clear and moist. No ulcerations or evidence of oropharyngeal candidiasis. Neck is supple.  NODES:  No cervical, supraclavicular, or axillary lymphadenopathy palpated.  BREAST EXAM: Status post left breast lumpectomy scar noted no masses appreciated in both the breasts. No bilateral axillary lymphadenopathy was noted LUNGS:  Clear to auscultation bilaterally.  No wheezes or rhonchi. HEART:  Regular rate and rhythm tacycardicNo murmur . ABDOMEN:  Soft, nontender.  Positive, normoactive bowel sounds. No organomegaly palpated. MSK:  No focal spinal tenderness to palpation. Full range of motion bilaterally in the upper extremities. EXTREMITIES:  No peripheral edema.  2+ pedal pulses SKIN:  Clear with no obvious rashes or skin changes. No nail dyscrasia. NEURO:  Nonfocal. Well oriented.  Appropriate affect. ECOG PERFORMANCE STATUS:    LABORATORY  DATA: Lab Results  Component Value Date   WBC 7.8 05/29/2013   HGB 11.5* 05/29/2013   HCT 34.6* 05/29/2013   MCV 93.5 05/29/2013   PLT 173 05/29/2013      Chemistry      Component Value Date/Time   NA 137 05/22/2013 1256   NA 136 01/23/2013 1413   K 3.8 05/22/2013 1256   K 3.7 01/23/2013 1413   CL 102 01/23/2013 1413   CO2 25 05/22/2013 1256   CO2 27 01/23/2013 1413   BUN 24.6 05/22/2013 1256   BUN 12 01/23/2013 1413   CREATININE 0.7 05/22/2013 1256   CREATININE 0.68 01/23/2013 1413      Component Value Date/Time   CALCIUM 9.2 05/22/2013 1256   CALCIUM 9.3 01/23/2013 1413   ALKPHOS 44 05/22/2013 1256   ALKPHOS 74 01/23/2013 1413   AST 13 05/22/2013 1256   AST 27 01/23/2013 1413   ALT 36 05/22/2013 1256   ALT 30 01/23/2013 1413   BILITOT 0.77 05/22/2013 1256   BILITOT 0.5 01/23/2013 1413      RADIOGRAPHIC STUDI   ASSESSMENT:     #1 Stage II, invasive ductal carcinoma of the left breast, grade 3, estrogen receptor/progesterone receptor positive, Ki-67 25%, HER-2/neu negative. Status post left breast lumpectomy with left axillary sentinel lymph node biopsy on 12/10/2012 with 1 of 2 sentinel nodes revealing microscopic disease. The patient elected not to receive axillary lymph node dissection and will undergo radiation therapy after the completion of chemotherapy.    #2 status post adjuvant chemotherapy on 01/23/2013 consisting of FEC (5-FU/Epirubicin/Cytoxan) with Neulasta support given every 2 weeks for a total of 6 cycles.  This will be followed by adjuvant chemotherapy consisting of Taxol on a weekly basis for 12 weeks starting 04/17/13. Port-A-Cath placement on 01/09/2013, 2D echocardiogram with LVEF of 50% - 55%  and chemotherapy class on 01/12/2013. She underwent a repeat echocardiogram on 03/05/13 that demonstrated an improvement in her LVEF to 55-60%, she was evaluated by Dr. Haroldine Laws on 03/09/13 who cleared her to continue with Epirubicin therapy, and will f/u with her in 3 months.    #3 Right  lower extremity DVT on 03/16/13, on Xarelto.  A hypercoagulable panel was drawn and demonstrated a positive lupus anticoagulant.  We will retest this in 12 weeks.  #4 Patient will proceed with week 7 of Taxol today  #5Continue Zoladex q28 days  PLAN:  1.  Patient is doing well today.  She will proceed with cycle 7  of  Taxol   2.  Her epistaxis has essentially resolved with saline nasal spray two to three times per day.    3.  She receives Zoladex every 28 days, her next dose is on  06/12/13  4.  She will return in one week for labs, evaluation and week  8 of Taxol chemotherapy.  We will continue to monitor her closely for peripheral neuropathy.    All questions answered. Ms. Stegmaier was encouraged to contact us in the interim with any questions or problems  I spent 25 minutes counseling the patient face to face.  The total time spent in the appointment was 30 minutes.  Marcy Panning, MD Medical/Oncology [facility] [phone] (beeper) [phone] (Office)

## 2013-06-05 ENCOUNTER — Encounter: Payer: Self-pay | Admitting: Oncology

## 2013-06-05 ENCOUNTER — Ambulatory Visit: Payer: BC Managed Care – PPO

## 2013-06-05 ENCOUNTER — Other Ambulatory Visit (HOSPITAL_BASED_OUTPATIENT_CLINIC_OR_DEPARTMENT_OTHER): Payer: BC Managed Care – PPO

## 2013-06-05 ENCOUNTER — Telehealth: Payer: Self-pay | Admitting: Oncology

## 2013-06-05 ENCOUNTER — Ambulatory Visit (HOSPITAL_BASED_OUTPATIENT_CLINIC_OR_DEPARTMENT_OTHER): Payer: BC Managed Care – PPO | Admitting: Oncology

## 2013-06-05 VITALS — BP 120/79 | HR 128 | Temp 97.9°F | Resp 20 | Ht 69.0 in | Wt 244.1 lb

## 2013-06-05 DIAGNOSIS — Z170 Estrogen receptor positive status [ER+]: Secondary | ICD-10-CM

## 2013-06-05 DIAGNOSIS — G620 Drug-induced polyneuropathy: Secondary | ICD-10-CM

## 2013-06-05 DIAGNOSIS — C50319 Malignant neoplasm of lower-inner quadrant of unspecified female breast: Secondary | ICD-10-CM

## 2013-06-05 DIAGNOSIS — C50312 Malignant neoplasm of lower-inner quadrant of left female breast: Secondary | ICD-10-CM

## 2013-06-05 DIAGNOSIS — E2839 Other primary ovarian failure: Secondary | ICD-10-CM

## 2013-06-05 DIAGNOSIS — L270 Generalized skin eruption due to drugs and medicaments taken internally: Secondary | ICD-10-CM

## 2013-06-05 LAB — CBC WITH DIFFERENTIAL/PLATELET
BASO%: 0.4 % (ref 0.0–2.0)
Basophils Absolute: 0 10*3/uL (ref 0.0–0.1)
EOS%: 0.4 % (ref 0.0–7.0)
Eosinophils Absolute: 0 10*3/uL (ref 0.0–0.5)
HEMATOCRIT: 30.7 % — AB (ref 34.8–46.6)
HGB: 10.2 g/dL — ABNORMAL LOW (ref 11.6–15.9)
LYMPH%: 34.9 % (ref 14.0–49.7)
MCH: 31.2 pg (ref 25.1–34.0)
MCHC: 33.2 g/dL (ref 31.5–36.0)
MCV: 93.9 fL (ref 79.5–101.0)
MONO#: 0.4 10*3/uL (ref 0.1–0.9)
MONO%: 7.7 % (ref 0.0–14.0)
NEUT#: 2.7 10*3/uL (ref 1.5–6.5)
NEUT%: 56.6 % (ref 38.4–76.8)
Platelets: 148 10*3/uL (ref 145–400)
RBC: 3.27 10*6/uL — ABNORMAL LOW (ref 3.70–5.45)
RDW: 19.5 % — ABNORMAL HIGH (ref 11.2–14.5)
WBC: 4.8 10*3/uL (ref 3.9–10.3)
lymph#: 1.7 10*3/uL (ref 0.9–3.3)
nRBC: 3 % — ABNORMAL HIGH (ref 0–0)

## 2013-06-05 LAB — COMPREHENSIVE METABOLIC PANEL (CC13)
ALK PHOS: 46 U/L (ref 40–150)
ALT: 70 U/L — ABNORMAL HIGH (ref 0–55)
AST: 29 U/L (ref 5–34)
Albumin: 3.2 g/dL — ABNORMAL LOW (ref 3.5–5.0)
Anion Gap: 11 mEq/L (ref 3–11)
BILIRUBIN TOTAL: 1.11 mg/dL (ref 0.20–1.20)
BUN: 18.1 mg/dL (ref 7.0–26.0)
CO2: 22 mEq/L (ref 22–29)
Calcium: 9.5 mg/dL (ref 8.4–10.4)
Chloride: 103 mEq/L (ref 98–109)
Creatinine: 0.7 mg/dL (ref 0.6–1.1)
GLUCOSE: 144 mg/dL — AB (ref 70–140)
Potassium: 3.9 mEq/L (ref 3.5–5.1)
Sodium: 137 mEq/L (ref 136–145)
Total Protein: 6 g/dL — ABNORMAL LOW (ref 6.4–8.3)

## 2013-06-05 LAB — TECHNOLOGIST REVIEW

## 2013-06-05 MED ORDER — GABAPENTIN 300 MG PO CAPS: 300.0000 mg | ORAL_CAPSULE | Freq: Two times a day (BID) | ORAL | Status: DC

## 2013-06-05 NOTE — Patient Instructions (Signed)
We will stop chemotherapy  Refer to Dr. Adaline Sill for radiation therapy  Begin neurontin 300 mg twice a day for the neuropathy.  Please call with any problems  Gabapentin capsules or tablets What is this medicine? GABAPENTIN (GA ba pen tin) is used to control partial seizures in adults with epilepsy. It is also used to treat certain types of nerve pain. This medicine may be used for other purposes; ask your health care provider or pharmacist if you have questions. COMMON BRAND NAME(S): Orpha Bur , Neurontin What should I tell my health care provider before I take this medicine? They need to know if you have any of these conditions: -kidney disease -suicidal thoughts, plans, or attempt; a previous suicide attempt by you or a family member -an unusual or allergic reaction to gabapentin, other medicines, foods, dyes, or preservatives -pregnant or trying to get pregnant -breast-feeding How should I use this medicine? Take this medicine by mouth with a glass of water. Follow the directions on the prescription label. You can take it with or without food. If it upsets your stomach, take it with food.Take your medicine at regular intervals. Do not take it more often than directed. Do not stop taking except on your doctor's advice. If you are directed to break the 600 or 800 mg tablets in half as part of your dose, the extra half tablet should be used for the next dose. If you have not used the extra half tablet within 28 days, it should be thrown away. A special MedGuide will be given to you by the pharmacist with each prescription and refill. Be sure to read this information carefully each time. Talk to your pediatrician regarding the use of this medicine in children. Special care may be needed. Overdosage: If you think you have taken too much of this medicine contact a poison control center or emergency room at once. NOTE: This medicine is only for you. Do not share this medicine with others. What  if I miss a dose? If you miss a dose, take it as soon as you can. If it is almost time for your next dose, take only that dose. Do not take double or extra doses. What may interact with this medicine? Do not take this medicine with any of the following medications: -other gabapentin products This medicine may also interact with the following medications: -alcohol -antacids -antihistamines for allergy, cough and cold -certain medicines for anxiety or sleep -certain medicines for depression or psychotic disturbances -homatropine; hydrocodone -naproxen -narcotic medicines (opiates) for pain -phenothiazines like chlorpromazine, mesoridazine, prochlorperazine, thioridazine This list may not describe all possible interactions. Give your health care provider a list of all the medicines, herbs, non-prescription drugs, or dietary supplements you use. Also tell them if you smoke, drink alcohol, or use illegal drugs. Some items may interact with your medicine. What should I watch for while using this medicine? Visit your doctor or health care professional for regular checks on your progress. You may want to keep a record at home of how you feel your condition is responding to treatment. You may want to share this information with your doctor or health care professional at each visit. You should contact your doctor or health care professional if your seizures get worse or if you have any new types of seizures. Do not stop taking this medicine or any of your seizure medicines unless instructed by your doctor or health care professional. Stopping your medicine suddenly can increase your seizures or their severity. Wear  a medical identification bracelet or chain if you are taking this medicine for seizures, and carry a card that lists all your medications. You may get drowsy, dizzy, or have blurred vision. Do not drive, use machinery, or do anything that needs mental alertness until you know how this medicine  affects you. To reduce dizzy or fainting spells, do not sit or stand up quickly, especially if you are an older patient. Alcohol can increase drowsiness and dizziness. Avoid alcoholic drinks. Your mouth may get dry. Chewing sugarless gum or sucking hard candy, and drinking plenty of water will help. The use of this medicine may increase the chance of suicidal thoughts or actions. Pay special attention to how you are responding while on this medicine. Any worsening of mood, or thoughts of suicide or dying should be reported to your health care professional right away. Women who become pregnant while using this medicine may enroll in the [place] Pregnancy Registry by calling [id number]. This registry collects information about the safety of antiepileptic drug use during pregnancy. What side effects may I notice from receiving this medicine? Side effects that you should report to your doctor or health care professional as soon as possible: -allergic reactions like skin rash, itching or hives, swelling of the face, lips, or tongue -worsening of mood, thoughts or actions of suicide or dying Side effects that usually do not require medical attention (report to your doctor or health care professional if they continue or are bothersome): -constipation -difficulty walking or controlling muscle movements -dizziness -nausea -slurred speech -tiredness -tremors -weight gain This list may not describe all possible side effects. Call your doctor for medical advice about side effects. You may report side effects to FDA at 1-800-FDA-1088. Where should I keep my medicine? Keep out of reach of children. Store at room temperature between 15 and 30 degrees C (59 and 86 degrees F). Throw away any unused medicine after the expiration date. NOTE: This sheet is a summary. It may not cover all possible information. If you have questions about this medicine, talk to your doctor, pharmacist,  or health care provider.  2014, Elsevier/Gold Standard. (2012-11-06 09:12:48)

## 2013-06-05 NOTE — Progress Notes (Signed)
[facility] OFFICE PROGRESS NOTE  Patient Care Team: Stacie L. Sherre Lain as PCP - General (Obstetrics and Gynecology) Amada Kingfisher, MD as Consulting Physician (Hematology and Oncology) Dr. Arloa Koh Dr. Autumn Messing  DIAGNOSIS: 38 y.o. 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 An ultrasound on 10/06/2012 showed a 1.6 x 1.3 x 1.3 cm mass at the 8 o'clock position 7 cm from the nipple. At the 12 o'clock position behind and nipple was a circumscribed oval shaped 2 x 1.8 x 1.2 cm lesion perhaps representing a fibroadenoma. 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 The carcinoma was grade 2-3, estrogen receptor 80% positive, progesterone receptor 80% positive, Ki-67 25%, and HER-2/neu negative. Bilateral breast MRI on 10/22/2012 showed biopsy proven carcinoma in the left lower inner quadrant identified and measures approximately 1.9 x 1.9 x 1.8 cm. Well-defined nodule in the right upper outer quadrant posteriorly consistent with a fibroadenoma. Probable liver cysts.There were multiple scattered nonspecific foci in each breast. No other suspicious abnormality was identified. No abnormal appearing lymph nodes (Clinical stage I, T1 N0).  #2 Status post left breast lumpectomy with left axillary sentinel lymph node biopsy on 12/10/2012 for a stage IIB, pT2, pN28m, MX, 2.3 cm invasive ductal carcinoma, grade 3, estrogen receptor 80% positive, progesterone receptor 80% positive, Ki-67 25%,  HER-2/neu negative, with 1/2 left axillary sentinel lymph nodes positive for microscopic disease.  #3 [place] BRCA1 and BRCA2 negative.  #4 s/p Adjuvant chemotherapy consisting of FEC (5-FU/epirubicin/Cytoxan) began on 01/23/2013. She completed 6 cycles of FEC on 04/03/13.  #5. S/padjuvant weekly Taxol beginning 04/17/13 - 06/05/13 x 7 of the 12 planned cycles. Chemotherapy stopped due to grade III skin toxicity.   #6 Right lower extremity DVT diagnosed on 03/16/2013, patient was placed on Xarelto 17mBID. Her dose was changed to 2091maily on 04/10/13.      INTERVAL HISTORY: SheQuisha Mabie 38o. female returns for followup visit today prior to cycle 8 of adjuvant weekly Taxol. Her course is complicated by development of severe hand-foot syndrome with blistering of the feet rash. In the erythematous raised rash on her hands and elbows. All consistent with side effects from the Taxol therapy. She also has grade 2 and neuropathic pain in her feet. She is not able to wear normal shoes. She is fatigued tired and unable to rest at night. She does not complain of any nausea or vomiting. She has not had any bowel trouble. No headaches double vision blurring of vision. Remainder of the review of systems is as below.  I have reviewed the past medical history, past surgical history, social history and family history with the patient and they are unchanged from previous note.  ALLERGIES:  is allergic to amoxicillin.  MEDICATIONS:  Current Outpatient Prescriptions  Medication Sig Dispense Refill  . ALPRAZolam (XANAX) 0.25 MG tablet Take 1 tablet (0.25 mg total) by mouth at bedtime as needed for sleep.  30 tablet  0  . clarithromycin (BIAXIN) 500 MG tablet Take 1 tablet (500 mg total) by mouth 2 (two) times daily.  30 tablet  0  . dexamethasone (DECADRON) 4 MG tablet Take 2 tablets by mouth once a day on the day after chemotherapy  and then take 2 tablets two times a day for 2 days. Take with food.  30 tablet  1  . HYDROcodone-acetaminophen (NORCO/VICODIN) 5-325 MG per tablet Take 1-2 tablets by mouth every 4 (four) hours as needed.  60 tablet  0  . lidocaine-prilocaine (EMLA) cream Apply topically as needed.  30 g  0  . lisinopril (PRINIVIL,ZESTRIL) 10 MG tablet Take 10 mg by mouth daily.      Marland Kitchen LORazepam (ATIVAN) 0.5 MG tablet Take 1 tablet (0.5 mg total) by mouth every 6 (six) hours as needed (Nausea or vomiting).  30 tablet  0  . omeprazole (PRILOSEC) 20 MG capsule TAKE ONE CAPSULE BY MOUTH EVERY DAY  30 capsule  2  . ondansetron (ZOFRAN) 8 MG tablet Take 1 tablet (8 mg total) by mouth 2 (two) times daily. Take two times a day starting the day after chemo for 2 days. Then take two times a day as needed for nausea or vomiting.  30 tablet  1  . prochlorperazine (COMPAZINE) 10 MG tablet Take 1 tablet (10 mg total) by mouth every 6 (six) hours as needed (Nausea or vomiting).  30 tablet  1  . Rivaroxaban (XARELTO) 20 MG TABS tablet Take 1 tablet (20 mg total) by mouth daily with supper.  30 tablet  6  . gabapentin (NEURONTIN) 300 MG capsule Take 1 capsule (300 mg total) by mouth 2 (two) times daily.  60 capsule  6   No current facility-administered medications for this visit.    REVIEW OF SYSTEMS:   Constitutional: Denies fevers, chills or abnormal weight loss Eyes: Denies blurriness of vision Ears, nose, mouth, throat, and face: Denies mucositis or sore throat Respiratory: Denies cough, dyspnea or wheezes Cardiovascular: Denies palpitation, chest discomfort or lower extremity swelling Gastrointestinal:  Denies nausea, heartburn or change in bowel habits Skin:  skin rashes on hands and feet Lymphatics: Denies new lymphadenopathy or easy bruising Neurological: tingling and numbness in the hands and feet Behavioral/Psych: Mood is stable, no new changes  All other systems were reviewed with the patient and are  negative.  PHYSICAL EXAMINATION: ECOG PERFORMANCE STATUS: 2 - Symptomatic, <50% confined to bed  Filed Vitals:   06/05/13 1325  BP: 120/79  Pulse: 128  Temp: 97.9 F (36.6 C)  Resp: 20   Filed Weights   06/05/13 1325  Weight: 244 lb 1.6 oz (110.723 kg)    GENERAL:alert, no distress and comfortable SKIN: skin color, texture, turgor are normal, no rashes or significant lesions EYES: normal, Conjunctiva are pink and non-injected, sclera clear OROPHARYNX:no exudate, no erythema and lips, buccal mucosa, and tongue normal  NECK: supple, thyroid normal size, non-tender, without nodularity LYMPH:  no palpable lymphadenopathy in the cervical, axillary or inguinal LUNGS: clear to auscultation and percussion with normal breathing effort HEART: regular rate & rhythm and no murmurs and no lower extremity edema ABDOMEN:abdomen soft, non-tender and normal bowel sounds Musculoskeletal:no cyanosis of digits and no clubbing  NEURO: alert & oriented x 3 with fluent speech, no focal motor/sensory deficits  LABORATORY DATA:  I have reviewed the data as listed    Component Value Date/Time   NA 137 06/05/2013 1315   NA 136 01/23/2013 1413  K 3.9 06/05/2013 1315   K 3.7 01/23/2013 1413   CL 102 01/23/2013 1413   CO2 22 06/05/2013 1315   CO2 27 01/23/2013 1413   GLUCOSE 144* 06/05/2013 1315   GLUCOSE 112* 01/23/2013 1413   BUN 18.1 06/05/2013 1315   BUN 12 01/23/2013 1413   CREATININE 0.7 06/05/2013 1315   CREATININE 0.68 01/23/2013 1413   CALCIUM 9.5 06/05/2013 1315   CALCIUM 9.3 01/23/2013 1413   PROT 6.0* 06/05/2013 1315   PROT 6.5 01/23/2013 1413   ALBUMIN 3.2* 06/05/2013 1315   ALBUMIN 3.2* 01/23/2013 1413   AST 29 06/05/2013 1315   AST 27 01/23/2013 1413   ALT 70* 06/05/2013 1315   ALT 30 01/23/2013 1413   ALKPHOS 46 06/05/2013 1315   ALKPHOS 74 01/23/2013 1413   BILITOT 1.11 06/05/2013 1315   BILITOT 0.5 01/23/2013 1413   GFRNONAA >90 12/03/2012 1409   GFRAA >90 12/03/2012 1409    No results found  for this basename: SPEP, UPEP,  kappa and lambda light chains    Lab Results  Component Value Date   WBC 4.8 06/05/2013   NEUTROABS 2.7 06/05/2013   HGB 10.2* 06/05/2013   HCT 30.7* 06/05/2013   MCV 93.9 06/05/2013   PLT 148 06/05/2013      Chemistry      Component Value Date/Time   NA 137 06/05/2013 1315   NA 136 01/23/2013 1413   K 3.9 06/05/2013 1315   K 3.7 01/23/2013 1413   CL 102 01/23/2013 1413   CO2 22 06/05/2013 1315   CO2 27 01/23/2013 1413   BUN 18.1 06/05/2013 1315   BUN 12 01/23/2013 1413   CREATININE 0.7 06/05/2013 1315   CREATININE 0.68 01/23/2013 1413      Component Value Date/Time   CALCIUM 9.5 06/05/2013 1315   CALCIUM 9.3 01/23/2013 1413   ALKPHOS 46 06/05/2013 1315   ALKPHOS 74 01/23/2013 1413   AST 29 06/05/2013 1315   AST 27 01/23/2013 1413   ALT 70* 06/05/2013 1315   ALT 30 01/23/2013 1413   BILITOT 1.11 06/05/2013 1315   BILITOT 0.5 01/23/2013 1413       RADIOGRAPHIC STUDIES: I have personally reviewed the radiological images as listed and agreed with the findings in the report. No results found.    ASSESSMENT & PLAN:  38 year old female with   #1 stage II (T T1 N1) invasive ductal carcinoma of the left breast grade 3, ER positive PR positive HER-2/neu negative with a proliferation marker Ki-67 25%. Patient underwent left breast lumpectomy with left sentinel lymph node biopsy on 12/10/2012 with one of 2 sentinel nodes positive for microscopic disease. Patient elected not to receive axillary lymph node dissection. She has been receiving adjuvant chemotherapy. She initially received FEC x6 cycles from 01/23/2013 through 04/03/2013. She was then begun on weekly Taxol starting 04/17/2013. She was here for cycle 8 of her treatment. 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 Patient understands rationale for this. We went over the pathology again. I think she has received enough chemotherapy.  #2 patient  will be referred to radiation oncology. She has seen Dr. Adela Lank in the past. He will begin radiation therapy as soon as possible.  #3 hand foot syndrome: We will continue to monitor.  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 We certainly can increase the dose if needed. I've also recommended that she begin super B  complex.  #5 ovarian function suppression: Patient is on Zoladex every 28 days her next dose will be on 06/12/2013  #6 followup: I will see her back in one week as scheduled appear  Orders Placed This Encounter  Procedures  . Ambulatory referral to Radiation Oncology    Referral Priority:  Routine    Referral Type:  Consultation    Referral Reason:  Specialty Services Required    Referred to Provider:  Rexene Edison, MD    Requested Specialty:  Radiation Oncology    Number of Visits Requested:  1   All questions were answered. The patient knows to call the clinic with any problems, questions or concerns. No barriers to learning was detected. I spent 25 minutes counseling the patient face to face. The total time spent in the appointment was 30 minutes and more than 50% was on counseling and review of test results     Marcy Panning, MD 06/05/2013 2:36 PM

## 2013-06-05 NOTE — Telephone Encounter (Signed)
, °

## 2013-06-09 ENCOUNTER — Encounter: Payer: Self-pay | Admitting: Radiation Oncology

## 2013-06-09 ENCOUNTER — Telehealth: Payer: Self-pay | Admitting: Oncology

## 2013-06-09 DIAGNOSIS — C50919 Malignant neoplasm of unspecified site of unspecified female breast: Secondary | ICD-10-CM | POA: Insufficient documentation

## 2013-06-09 NOTE — Progress Notes (Signed)
Location of Breast Cancer: left lower inner, 8 o'clock  Histology per Pathology Report:  12/10/13 Diagnosis 1. Lymph node, sentinel, biopsy, left - ONE LYMPH NODE POSITIVE FOR MICROMETASTATIC DUCTAL CARCINOMA (1/1). 2. Lymph node, sentinel, biopsy, left - ONE BENIGN LYMPH NODE WITH NO TUMOR SEEN (0/1). 3. Breast, lumpectomy, left - INVASIVE GRADE III DUCTAL CARCINOMA SPANNING 2.3 CM IN GREATEST DIMENSION. - ASSOCIATED INTERMEDIATE GRADE DUCTAL CARCINOMA IN SITU WITH NECROSIS. - LYMPH/VASCULAR INVASION IS IDENTIFIED. - MARGINS ARE NEGATIVE. - SEE ONCOLOGY TEMPLATE 4. Breast, excision, left - BENIGN BREAST PARENCHYMA. - NO TUMOR SEEN.  10/16/13 Diagnosis 1. Consult- Comprehensive, Left Breast $RemoveBef'@[id number]$ , lower medial - INVASIVE DUCTAL CARCINOMA. - SEE COMMENT. 2. Consult Slide , Lower Breast @ 1200 - FIBROCYSTIC CHANGES. - THERE IS NO EVIDENCE OF MALIGNANCY.  10/06/12 infiltrating ductal carcinoma, left breast, [place], [place]  Receptor Status: ER(80%), PR (80%), Her2-neu (-)  Did patient present with symptoms (if so, please note symptoms) or was this found on screening mammography?: screening mammogram  Past/Anticipated interventions by surgeon, if any: 12/10/13 left lumpectomy, node sampling  Past/Anticipated interventions by medical oncology, if any: Chemotherapy - Dr Humphrey Rolls: 06/05/13  pt receiving adjuvant chemotherapy. She initially received FEC x 6 cycles from 01/23/2013 through 04/03/2013. She was then begun on weekly Taxol starting 04/17/2013. She was here for cycle 8 of her treatment. 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 Patient understands rationale for this. We went over the pathology again. I think she has received enough chemotherapy.  Lymphedema issues, if any:  No swelling of left arm, hand. Left foot swollen today. Pt states this is side effect of chemotherapy. She is to see med onc this Friday.  Pain  issues, if any:  Left foot where she has red rash, swelling. Pain 7/10. She took Hydrocodone upon entering cancer center today for this appointment. She takes Hydrocodone 3 x daily for this pain.  SAFETY ISSUES:  Prior radiation? no  Pacemaker/ICD? no  Possible current pregnancy? na  Is the patient on methotrexate? no  Current Complaints / other details:  Single, 25 yr old daughter, works for [employer] in [place]. Numbness, tingling of hands and feet. Resolving rash on hands, rash on feet from chemotherapy, especially left foot.  menarche age 63, P69 @ age 41,  last menstrual cycle 09/29/12    Andria Rhein, RN 06/09/2013,9:49 AM

## 2013-06-09 NOTE — Telephone Encounter (Signed)
, °

## 2013-06-10 ENCOUNTER — Other Ambulatory Visit: Payer: Self-pay | Admitting: *Deleted

## 2013-06-10 ENCOUNTER — Encounter: Payer: Self-pay | Admitting: Radiation Oncology

## 2013-06-10 ENCOUNTER — Ambulatory Visit
Admission: RE | Admit: 2013-06-10 | Discharge: 2013-06-10 | Disposition: A | Discharge status: Home / Self Care | Payer: BC Managed Care – PPO | Source: Ambulatory Visit | Attending: Radiation Oncology | Admitting: Radiation Oncology

## 2013-06-10 VITALS — BP 126/73 | HR 120 | Temp 98.5°F | Resp 20 | Wt 254.0 lb

## 2013-06-10 DIAGNOSIS — Z510 Encounter for antineoplastic radiation therapy: Secondary | ICD-10-CM | POA: Insufficient documentation

## 2013-06-10 DIAGNOSIS — C50319 Malignant neoplasm of lower-inner quadrant of unspecified female breast: Secondary | ICD-10-CM

## 2013-06-10 MED ORDER — HYDROCODONE-ACETAMINOPHEN 5-325 MG PO TABS: 1.0000 | ORAL_TABLET | ORAL | Status: DC | PRN

## 2013-06-10 NOTE — Progress Notes (Signed)
Please see the Nurse Progress Note in the MD Initial Consult Encounter for this patient. 

## 2013-06-10 NOTE — Progress Notes (Signed)
CC: Dr. Autumn Messing III, Dr. Marcy Panning  Followup note:  Diagnosis: Pathologic stage II B (T2 N59m M0) invasive ductal carcinoma of the left breast  [name] a most pleasant 38year old female who is seen today for review and scheduling of her radiation therapy in the management of her T2 N133mM0 invasive ductal carcinoma of the left breast. [place] she is felt to have a new mass within the left breast. Additional views and ultrasound on 10/06/2012 showed a 1.6 x 1.3 x 1.3 cm mass at 8:00 position, 7 cm from the nipple. At the 12:00 position behind the nipple was a circumscribed oval shaped 2 x 1.8 x 1.2 cm lesion perhaps representing a fibroadenoma. Ultrasound-guided biopsies on 10/06/2012 revealed fibrocystic changes at 12:00 and invasive ductal carcinoma at 8:00. The carcinoma was grade II-III with a Ki-67 of approximately 25%. HER-2/neu was not amplified. She was ER/PR positive at 80%. Her pathology was reviewed here by Dr. KiLyndon CodeShe had genetic testing and [place] and she was BRCA negative. On 12/10/2012 she underwent a left partial mastectomy and sentinel lymph node biopsy. She is found to have a 2.3 cm primary tumor with the closest margin being 0.4 cm, superiorly. 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 LV I was identified. One of 2 lymph nodes contained a micrometastasis. Again she is ER positive at 80%, PR positive at 80%, HER-2/neu negative with a Ki-67 of 25%. She went onto receive adjuvant chemotherapy with FEC beginning on 01/23/2013 and completing 6 cycles on 04/03/2013. She then received adjuvant weekly Taxol which was discontinued after the seventh of 12 planned cycles because of grade 3 skin toxicity. She is still recovering from this. Her major complaint is that of tingling numbness and pain of her feet. She will see Dr. KhHumphrey Rollsater today.  Physical examination: Alert and oriented. Filed Vitals:   06/10/13 1359  BP: 126/73  Pulse: 120  Temp: 98.5 F (36.9 C)  Resp: 20   Head and neck examination: She wears a scarf. Nodes: There is no palpable cervical, supraclavicular, or axillary lymphadenopathy. Chest: Lungs clear. Breasts: There is a partial mastectomy scar at approximately 8:00 along the lower inner quadrant of the left breast. No masses are appreciated. Right breast without masses or lesions. Abdomen without hepatomegaly. Extremities there is hyperpigmentation of the skin along the wrist and hands with dry desquamation. There is no lymphedema.  Laboratory data: Lab Results  Component Value Date   WBC 4.8 06/05/2013   HGB 10.2* 06/05/2013   HCT 30.7* 06/05/2013   MCV 93.9 06/05/2013   PLT 148 06/05/2013     Impression: Stage II B (T2 N1m63m0) invasive ductal/DCIS of the left breast. She is a candidate for breast preservation. Based on the Z-11 and AMAROS trials, she does not need an axillary node dissection. I plan to treat her left breast and regional lymph nodes. We discussed the potential acute and late toxicities of radiation therapy. She may benefit from deep inspiration and breath-hold technology to avoid cardiac irradiation. Consent is signed today. I will have her return for simulation/treatment planning the week of April 6.  Plan: As discussed above.  30 minutes was spent face-to- face with the patient, primarily counseling the patient and coordinating her care.

## 2013-06-12 ENCOUNTER — Ambulatory Visit (HOSPITAL_BASED_OUTPATIENT_CLINIC_OR_DEPARTMENT_OTHER): Payer: BC Managed Care – PPO | Admitting: Hematology and Oncology

## 2013-06-12 ENCOUNTER — Ambulatory Visit: Payer: BC Managed Care – PPO | Admitting: Oncology

## 2013-06-12 ENCOUNTER — Telehealth: Payer: Self-pay | Admitting: Hematology and Oncology

## 2013-06-12 ENCOUNTER — Ambulatory Visit (HOSPITAL_COMMUNITY)
Admission: RE | Admit: 2013-06-12 | Discharge: 2013-06-12 | Disposition: A | Discharge status: Home / Self Care | Payer: BC Managed Care – PPO | Source: Ambulatory Visit | Attending: Hematology and Oncology | Admitting: Hematology and Oncology

## 2013-06-12 ENCOUNTER — Other Ambulatory Visit (HOSPITAL_BASED_OUTPATIENT_CLINIC_OR_DEPARTMENT_OTHER): Payer: BC Managed Care – PPO

## 2013-06-12 ENCOUNTER — Ambulatory Visit: Payer: BC Managed Care – PPO

## 2013-06-12 ENCOUNTER — Other Ambulatory Visit: Payer: BC Managed Care – PPO

## 2013-06-12 VITALS — BP 119/82 | HR 121 | Temp 98.1°F | Resp 18 | Ht 69.0 in | Wt 254.1 lb

## 2013-06-12 DIAGNOSIS — M7989 Other specified soft tissue disorders: Secondary | ICD-10-CM | POA: Insufficient documentation

## 2013-06-12 DIAGNOSIS — R600 Localized edema: Secondary | ICD-10-CM

## 2013-06-12 DIAGNOSIS — Z86718 Personal history of other venous thrombosis and embolism: Secondary | ICD-10-CM | POA: Insufficient documentation

## 2013-06-12 DIAGNOSIS — C50319 Malignant neoplasm of lower-inner quadrant of unspecified female breast: Secondary | ICD-10-CM

## 2013-06-12 DIAGNOSIS — D539 Nutritional anemia, unspecified: Secondary | ICD-10-CM

## 2013-06-12 DIAGNOSIS — C50312 Malignant neoplasm of lower-inner quadrant of left female breast: Secondary | ICD-10-CM

## 2013-06-12 DIAGNOSIS — R609 Edema, unspecified: Secondary | ICD-10-CM

## 2013-06-12 DIAGNOSIS — M79609 Pain in unspecified limb: Secondary | ICD-10-CM | POA: Insufficient documentation

## 2013-06-12 DIAGNOSIS — I82409 Acute embolism and thrombosis of unspecified deep veins of unspecified lower extremity: Secondary | ICD-10-CM

## 2013-06-12 DIAGNOSIS — E2839 Other primary ovarian failure: Secondary | ICD-10-CM

## 2013-06-12 DIAGNOSIS — D649 Anemia, unspecified: Secondary | ICD-10-CM

## 2013-06-12 DIAGNOSIS — L0390 Cellulitis, unspecified: Secondary | ICD-10-CM

## 2013-06-12 DIAGNOSIS — G5790 Unspecified mononeuropathy of unspecified lower limb: Secondary | ICD-10-CM

## 2013-06-12 DIAGNOSIS — L270 Generalized skin eruption due to drugs and medicaments taken internally: Secondary | ICD-10-CM

## 2013-06-12 LAB — CBC WITH DIFFERENTIAL/PLATELET
BASO%: 0.4 % (ref 0.0–2.0)
Basophils Absolute: 0 10*3/uL (ref 0.0–0.1)
EOS%: 1 % (ref 0.0–7.0)
Eosinophils Absolute: 0 10*3/uL (ref 0.0–0.5)
HEMATOCRIT: 25.4 % — AB (ref 34.8–46.6)
HEMOGLOBIN: 8.5 g/dL — AB (ref 11.6–15.9)
LYMPH#: 1.2 10*3/uL (ref 0.9–3.3)
LYMPH%: 35.7 % (ref 14.0–49.7)
MCH: 31.7 pg (ref 25.1–34.0)
MCHC: 33.3 g/dL (ref 31.5–36.0)
MCV: 95 fL (ref 79.5–101.0)
MONO#: 0.4 10*3/uL (ref 0.1–0.9)
MONO%: 12.4 % (ref 0.0–14.0)
NEUT%: 50.5 % (ref 38.4–76.8)
NEUTROS ABS: 1.7 10*3/uL (ref 1.5–6.5)
PLATELETS: 166 10*3/uL (ref 145–400)
RBC: 2.68 10*6/uL — ABNORMAL LOW (ref 3.70–5.45)
RDW: 22 % — ABNORMAL HIGH (ref 11.2–14.5)
WBC: 3.4 10*3/uL — AB (ref 3.9–10.3)

## 2013-06-12 LAB — COMPREHENSIVE METABOLIC PANEL (CC13)
ALT: 63 U/L — AB (ref 0–55)
ANION GAP: 8 meq/L (ref 3–11)
AST: 32 U/L (ref 5–34)
Albumin: 3 g/dL — ABNORMAL LOW (ref 3.5–5.0)
Alkaline Phosphatase: 56 U/L (ref 40–150)
BUN: 6.7 mg/dL — ABNORMAL LOW (ref 7.0–26.0)
CALCIUM: 9.2 mg/dL (ref 8.4–10.4)
CHLORIDE: 108 meq/L (ref 98–109)
CO2: 24 meq/L (ref 22–29)
CREATININE: 0.6 mg/dL (ref 0.6–1.1)
GLUCOSE: 171 mg/dL — AB (ref 70–140)
Potassium: 4.4 mEq/L (ref 3.5–5.1)
Sodium: 140 mEq/L (ref 136–145)
TOTAL PROTEIN: 5.7 g/dL — AB (ref 6.4–8.3)
Total Bilirubin: 1.09 mg/dL (ref 0.20–1.20)

## 2013-06-12 MED ORDER — CEPHALEXIN 500 MG PO CAPS: 500.0000 mg | ORAL_CAPSULE | Freq: Four times a day (QID) | ORAL | Status: DC

## 2013-06-12 MED ORDER — POTASSIUM CHLORIDE CRYS ER 20 MEQ PO TBCR: 20.0000 meq | EXTENDED_RELEASE_TABLET | Freq: Two times a day (BID) | ORAL | Status: DC

## 2013-06-12 MED ORDER — FUROSEMIDE 20 MG PO TABS: 40.0000 mg | ORAL_TABLET | Freq: Two times a day (BID) | ORAL | Status: DC

## 2013-06-12 NOTE — Telephone Encounter (Signed)
gve the pt her march 2015 appt calendar.

## 2013-06-12 NOTE — Progress Notes (Signed)
*  Preliminary Results* Bilateral lower extremity venous duplex completed. Bilateral lower extremities are negative for deep vein thrombosis. There is no evidence of Baker's cyst bilaterally.  06/12/2013  Maudry Mayhew, RVT, RDCS, RDMS

## 2013-06-13 ENCOUNTER — Encounter: Payer: Self-pay | Admitting: Hematology and Oncology

## 2013-06-13 NOTE — Progress Notes (Signed)
. [facility] OFFICE PROGRESS NOTE  Patient Care Team: Stacie L. Sherre Lain as PCP - General (Obstetrics and Gynecology) Amada Kingfisher, MD as Consulting Physician (Hematology and Oncology) Dr. Arloa Koh Dr. Autumn Messing  DIAGNOSIS: 38 y.o. 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 An ultrasound on 10/06/2012 showed a 1.6 x 1.3 x 1.3 cm mass at the 8 o'clock position 7 cm from the nipple. At the 12 o'clock position behind and nipple was a circumscribed oval shaped 2 x 1.8 x 1.2 cm lesion perhaps representing a fibroadenoma. 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 The carcinoma was grade 2-3, estrogen receptor 80% positive, progesterone receptor 80% positive, Ki-67 25%, and HER-2/neu negative. Bilateral breast MRI on 10/22/2012 showed biopsy proven carcinoma in the left lower inner quadrant identified and measures approximately 1.9 x 1.9 x 1.8 cm. Well-defined nodule in the right upper outer quadrant posteriorly consistent with a fibroadenoma. Probable liver cysts.There were multiple scattered nonspecific foci in each breast. No other suspicious abnormality was identified. No abnormal appearing lymph nodes (Clinical stage I, T1 N0).  #2 Status post left breast lumpectomy with left axillary sentinel lymph node biopsy on 12/10/2012 for a stage IIB, pT2, pN18mi, MX, 2.3 cm invasive ductal carcinoma, grade 3, estrogen receptor 80% positive, progesterone receptor 80% positive, Ki-67  25%, HER-2/neu negative, with 1/2 left axillary sentinel lymph nodes positive for microscopic disease.  #3 Genetic testing performed in [place], [place] with self reported by the patient as BRCA1 and BRCA2 negative.  #4 s/p Adjuvant chemotherapy consisting of FEC (5-FU/epirubicin/Cytoxan) began on 01/23/2013. She completed 6 cycles of FEC on 04/03/13.  #5. S/padjuvant weekly Taxol beginning 04/17/13 - 06/05/13 x 7 of the 12 planned cycles. Chemotherapy stopped due to grade III skin toxicity.   #6 Right lower extremity DVT diagnosed on 03/16/2013, patient was placed on Xarelto $RemoveBe'15mg'bOQoLzPMV$  BID. Her dose was changed to $RemoveBe'20mg'OEHcoFues$  daily on 04/10/13.      INTERVAL HISTORY: [name] 38 y.o. female returns for followup visit today  prior weekly treatment with single agent Taxol. Her course is complicated by development of severe hand-foot syndrome with blistering of the feet and erythematous raised rash on her hands and elbows all  previously felt to be consistent with side effects from Taxol therapy. She also has grade 2 neuropathic pain in her feet and her feet feel numb She has significant both legs edema up to the knee level.She had a rather recent DVT right leg and she is on Xarelto. She is not able to wear normal shoes. She has significant difficulty ambulating. She is fatigued tired and unable to rest at night.Some of the prior skin blisters have dried out. Taxol chemotherapy as the result of toxicity.Last treatment was 05/29/2013 She denies fever. She does not complain of any nausea or vomiting. She has not had any bowel trouble. No headaches double vision blurring of vision. Remainder of the review of systems is as below.  I have reviewed the past medical history, past surgical  history, social history and family history with the patient and they are unchanged from previous note.  ALLERGIES:  is allergic to amoxicillin.  MEDICATIONS:  Current Outpatient Prescriptions  Medication Sig Dispense Refill  .  ALPRAZolam (XANAX) 0.25 MG tablet Take 1 tablet (0.25 mg total) by mouth at bedtime as needed for sleep.  30 tablet  0  . clarithromycin (BIAXIN) 500 MG tablet       . gabapentin (NEURONTIN) 300 MG capsule Take 1 capsule (300 mg total) by mouth 2 (two) times daily.  60 capsule  6  . HYDROcodone-acetaminophen (NORCO/VICODIN) 5-325 MG per tablet Take 1-2 tablets by mouth every 4 (four) hours as needed.  60 tablet  0  . lisinopril (PRINIVIL,ZESTRIL) 10 MG tablet Take 10 mg by mouth daily.      Marland Kitchen LORazepam (ATIVAN) 0.5 MG tablet Take 1 tablet (0.5 mg total) by mouth every 6 (six) hours as needed (Nausea or vomiting).  30 tablet  0  . omeprazole (PRILOSEC) 20 MG capsule TAKE ONE CAPSULE BY MOUTH EVERY DAY  30 capsule  2  . Rivaroxaban (XARELTO) 20 MG TABS tablet Take 1 tablet (20 mg total) by mouth daily with supper.  30 tablet  6  . cephALEXin (KEFLEX) 500 MG capsule Take 1 capsule (500 mg total) by mouth 4 (four) times daily.  30 capsule  0  . dexamethasone (DECADRON) 4 MG tablet Take 2 tablets by mouth once a day on the day after chemotherapy and then take 2 tablets two times a day for 2 days. Take with food.  30 tablet  1  . furosemide (LASIX) 20 MG tablet Take 2 tablets (40 mg total) by mouth 2 (two) times daily.  60 tablet  1  . lidocaine-prilocaine (EMLA) cream Apply topically as needed.  30 g  0  . ondansetron (ZOFRAN) 8 MG tablet Take 1 tablet (8 mg total) by mouth 2 (two) times daily. Take two times a day starting the day after chemo for 2 days. Then take two times a day as needed for nausea or vomiting.  30 tablet  1  . potassium chloride SA (K-DUR,KLOR-CON) 20 MEQ tablet Take 1 tablet (20 mEq total) by mouth 2 (two) times daily.  60 tablet  1  . prochlorperazine (COMPAZINE) 10 MG tablet Take 1 tablet (10 mg total) by mouth every 6 (six) hours as needed (Nausea or vomiting).  30 tablet  1   No current facility-administered medications for this visit.    REVIEW OF SYSTEMS:   Constitutional:  Denies fevers, chills or abnormal weight loss Eyes: Denies blurriness of vision Ears, nose, mouth, throat, and face: Denies mucositis or sore throat Respiratory: Denies cough, dyspnea or wheezes Cardiovascular: Denies palpitation, chest discomfort or lower extremity swelling Gastrointestinal:  Denies nausea, heartburn or change in bowel habits Skin:  skin rashes on hands and feet Lymphatics: Denies new lymphadenopathy or easy bruising Neurological: tingling and numbness in the hands and feet Behavioral/Psych: Mood is stable, no new changes  All other systems were reviewed with the patient and are negative.  PHYSICAL EXAMINATION: ECOG PERFORMANCE STATUS: 2 - Symptomatic, <50% confined to bed  Filed Vitals:   06/12/13 1134  BP: 119/82  Pulse: 121  Temp: 98.1 F (36.7 C)  Resp: 18   Filed Weights   06/12/13 1134  Weight: 254 lb 1.6 oz (115.259 kg)    GENERAL:alert, no distress and comfortable Face swollen SKIN: skin color, texture, turgor are normal EYES: normal, Conjunctiva are pink and  non-injected, sclera clear OROPHARYNX:no exudate, no erythema and lips, buccal mucosa, and tongue normal  NECK: supple, thyroid normal size, non-tender, without nodularity LYMPH:  no palpable lymphadenopathy in the cervical, axillary or inguinal LUNGS: clear to auscultation and percussion with normal breathing effort HEART: regular rate & rhythm and no murmurs ABDOMEN:abdomen soft, non-tender and normal bowel sounds Musculoskeletal:no cyanosis of digits NEURO: alert & oriented x 3 with fluent speech, no focal motor/sensory deficits 2-3+ bilateral lower extremity edema to the knee level. Bilateral legs/feet are red mostly feet,with scared dry prior blisters,no drainage. LABORATORY DATA:  I have reviewed the data as listed    Component Value Date/Time   NA 140 06/12/2013 1055   NA 136 01/23/2013 1413   K 4.4 06/12/2013 1055   K 3.7 01/23/2013 1413   CL 102 01/23/2013 1413   CO2 24 06/12/2013  1055   CO2 27 01/23/2013 1413   GLUCOSE 171* 06/12/2013 1055   GLUCOSE 112* 01/23/2013 1413   BUN 6.7* 06/12/2013 1055   BUN 12 01/23/2013 1413   CREATININE 0.6 06/12/2013 1055   CREATININE 0.68 01/23/2013 1413   CALCIUM 9.2 06/12/2013 1055   CALCIUM 9.3 01/23/2013 1413   PROT 5.7* 06/12/2013 1055   PROT 6.5 01/23/2013 1413   ALBUMIN 3.0* 06/12/2013 1055   ALBUMIN 3.2* 01/23/2013 1413   AST 32 06/12/2013 1055   AST 27 01/23/2013 1413   ALT 63* 06/12/2013 1055   ALT 30 01/23/2013 1413   ALKPHOS 56 06/12/2013 1055   ALKPHOS 74 01/23/2013 1413   BILITOT 1.09 06/12/2013 1055   BILITOT 0.5 01/23/2013 1413   GFRNONAA >90 12/03/2012 1409   GFRAA >90 12/03/2012 1409    No results found for this basename: SPEP,  UPEP,   kappa and lambda light chains    Lab Results  Component Value Date   WBC 3.4* 06/12/2013   NEUTROABS 1.7 06/12/2013   HGB 8.5* 06/12/2013   HCT 25.4* 06/12/2013   MCV 95.0 06/12/2013   PLT 166 06/12/2013      Chemistry      Component Value Date/Time   NA 140 06/12/2013 1055   NA 136 01/23/2013 1413   K 4.4 06/12/2013 1055   K 3.7 01/23/2013 1413   CL 102 01/23/2013 1413   CO2 24 06/12/2013 1055   CO2 27 01/23/2013 1413   BUN 6.7* 06/12/2013 1055   BUN 12 01/23/2013 1413   CREATININE 0.6 06/12/2013 1055   CREATININE 0.68 01/23/2013 1413      Component Value Date/Time   CALCIUM 9.2 06/12/2013 1055   CALCIUM 9.3 01/23/2013 1413   ALKPHOS 56 06/12/2013 1055   ALKPHOS 74 01/23/2013 1413   AST 32 06/12/2013 1055   AST 27 01/23/2013 1413   ALT 63* 06/12/2013 1055   ALT 30 01/23/2013 1413   BILITOT 1.09 06/12/2013 1055   BILITOT 0.5 01/23/2013 1413       RADIOGRAPHIC STUDIES: I have personally reviewed the radiological images as listed and agreed with the findings in the report. No results found.    ASSESSMENT & PLAN:  38 year old female with   #1 stage II (T T1 N1) invasive ductal carcinoma of the left breast grade 3, ER positive PR positive HER-2/neu negative with a proliferation marker  Ki-67 25%. Patient underwent left breast lumpectomy with left sentinel lymph node biopsy on 12/10/2012 with one of 2 sentinel nodes positive for microscopic disease. Patient elected not to receive axillary lymph node dissection. She has been receiving adjuvant chemotherapy. She  initially received FEC x6 cycles from 01/23/2013 through 04/03/2013. 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   #2 patien was referred to radiation oncology. She has seen Dr. Adela Lank in the past.She ewill begin radiation therapy  wext week.  #3 hand foot syndrome: We will continue to monitor. Developing cellulitis will order Keflex 500 mg 4 tablets daily for 10 days.  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  #5 ovarian function suppression: Patient is on Zoladex every 28 days and will receive today on 06/12/2013  #6bilateral marked edema with redness lower feet history of DVT right leg on Xarelto continue will check today venous Doppler lower extremities bilateral.  Assuming no change,patient was prescribed Lasix 40 mg 2 daily till follow up in [redacted] week along with K 20 meg 2 daily and will monitor labs next visit. She needs to keep her feet elevated as long as possible for edema . Anemia HCT 25.4 declined from  34.6 on 3/13 patient off chemotherapy no signs of bleeding but on Xarelto was given hemoccults  To bring on Monday and to watch for signs of bleeding will check CBC  #6 followup: I will see her back in one week  or as scheduled   All questions were answered. The patient knows to call the clinic with any problems, questions or concerns. No barriers to learning was detected. I spent 25 minutes counseling the patient face to face. The total time spent in the appointment was 30 minutes and more than 50% was on counseling and  review of test results     Amada Kingfisher, MD 06/13/2013 12:24 PM

## 2013-06-14 NOTE — Progress Notes (Addendum)
Hematology and Oncology Follow Up Visit  [name] [id number] 21-Jun-1975 38 y.o. 06/16/2013 12:32 PM     Principle Diagnosis:[name] 38 y.o. female with pathologic stage IIB ER/PR positive, HER-2/neu negative invasive ductal carcinoma of the left breast.     Prior Therapy: #1 A screening mammogram in [place], [place] on 09/23/2012 she was felt to have a new mass within the left breast. An ultrasound on 10/06/2012 showed a 1.6 x 1.3 x 1.3 cm mass at the 8 o'clock position 7 cm from the nipple. At the 12 o'clock position behind and nipple was a circumscribed oval shaped 2 x 1.8 x 1.2 cm lesion perhaps representing a fibroadenoma. 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 The carcinoma was grade 2-3, estrogen receptor 80% positive, progesterone receptor 80% positive, Ki-67 25%, and HER-2/neu negative. Bilateral breast MRI on 10/22/2012 showed biopsy proven carcinoma in the left lower inner quadrant identified and measures approximately 1.9 x 1.9 x 1.8 cm. Well-defined nodule in the right upper outer quadrant posteriorly consistent with a fibroadenoma. Probable liver cysts.There were multiple scattered nonspecific foci in each breast. No other suspicious abnormality was identified. No abnormal appearing lymph nodes (Clinical stage I, T1 N0).   #2 Status post left breast lumpectomy with left axillary sentinel lymph node biopsy on 12/10/2012 for a stage IIB, pT2, pN36m, MX, 2.3 cm invasive ductal carcinoma, grade 3, estrogen receptor 80% positive, progesterone receptor 80% positive, Ki-67 25%, HER-2/neu negative, with 1/2 left axillary sentinel lymph nodes positive for microscopic disease.   #3 [place]1 [place] BRCA2 negative.   #4 s/p Adjuvant chemotherapy consisting of FEC (5-FU/epirubicin/Cytoxan)  began on 01/23/2013. She completed 6 cycles of FEC on 04/03/13.   #5. S/p adjuvant weekly Taxol beginning 04/17/13 - 06/05/13 x 7 of the 12 planned cycles. Chemotherapy stopped due to grade III skin toxicity.   #6 Right lower extremity DVT diagnosed on 03/16/2013, patient was placed on Xarelto 151mBID. Her dose was changed to 2060maily on 04/10/13.    Current therapy:  Proceed to radiation therapy  Interim History: [name] 17o. female is here for f/u.  She was seen by Dr. FaiOwens Loffler Friday, March 27 for f/u after she developed hand foot syndrome from Taxol chemotherapy.  Today her hands and feet are much improved.  She has stayed off her feet pretty much all weekend and has noticed a big difference.  She took the antibiotics as prescribed.  She continues to have neuropathy in her fingertips and toes.  It does not affect her activities of daily living.  She is taking Gabapentin TID and has noticed an improvement.  She has continued to have tachycardia since starting Zoladex.  She denies chest pain, palpitations, shortness of breath or any other concerns.  She would like a referral to a gynecologist for discussion for TAH/BSO.  Radiation therapy will begin in the next 1-2 weeks.  Otherwise, a 10 point ROS is neg.   Medications:  Current Outpatient Prescriptions  Medication Sig Dispense Refill  . ALPRAZolam (XANAX) 0.25 MG tablet Take 1 tablet (0.25 mg total) by mouth at bedtime as needed for sleep.  30 tablet  0  . cephALEXin (KEFLEX) 500 MG capsule Take 1 capsule (500 mg total) by mouth 4 (four) times daily.  30 capsule  0  . furosemide (LASIX) 20 MG tablet  Take 2 tablets (40 mg total) by mouth 2 (two) times daily.  60 tablet  1  . gabapentin (NEURONTIN) 300 MG capsule Take 1 capsule (300 mg total) by mouth 2 (two) times daily.  60 capsule  6  . lidocaine-prilocaine (EMLA) cream Apply topically as needed.  30 g  0  . omeprazole (PRILOSEC) 20 MG capsule TAKE ONE CAPSULE BY MOUTH EVERY DAY  30  capsule  2  . potassium chloride SA (K-DUR,KLOR-CON) 20 MEQ tablet Take 1 tablet (20 mEq total) by mouth 2 (two) times daily.  60 tablet  1  . Rivaroxaban (XARELTO) 20 MG TABS tablet Take 1 tablet (20 mg total) by mouth daily with supper.  30 tablet  6  . lisinopril (PRINIVIL,ZESTRIL) 10 MG tablet Take 10 mg by mouth daily.      Marland Kitchen LORazepam (ATIVAN) 0.5 MG tablet Take 1 tablet (0.5 mg total) by mouth every 6 (six) hours as needed (Nausea or vomiting).  30 tablet  0  . ondansetron (ZOFRAN) 8 MG tablet Take 1 tablet (8 mg total) by mouth 2 (two) times daily. Take two times a day starting the day after chemo for 2 days. Then take two times a day as needed for nausea or vomiting.  30 tablet  1  . prochlorperazine (COMPAZINE) 10 MG tablet Take 1 tablet (10 mg total) by mouth every 6 (six) hours as needed (Nausea or vomiting).  30 tablet  1   No current facility-administered medications for this visit.     Allergies:  Allergies  Allergen Reactions  . Amoxicillin Other (See Comments)    States "it does not workEnvironmental manager History: Past Medical History  Diagnosis Date  . Urinary incontinence     occasional  . Family history of anesthesia complication     pt's mother has hx. of post-op N/V  . Hypertension     under control with med., has been on med. since 09/2012  . Anxiety   . Breast cancer 10/16/12    left, lower medial, 8 o'clock    Surgical History:  Past Surgical History  Procedure Laterality Date  . Cesarean section  1999  . Breast lumpectomy with needle localization and axillary sentinel lymph node bx Left 12/10/2012    Procedure: BREAST LUMPECTOMY WITH NEEDLE LOCALIZATION AND AXILLARY SENTINEL LYMPH NODE BX;  Surgeon: Merrie Roof, MD;  Location: [facility];  Service: General;  Laterality: Left;  Needle loc BCG 7:30  nuc med 9:30    . Tonsillectomy and adenoidectomy      as a teenager  . Cholecystectomy  1999  . Open reduction internal fixation (orif) tibia/fibula fracture  Right   . Portacath placement Right 01/09/2013    Procedure: INSERTION PORT-A-CATH;  Surgeon: Merrie Roof, MD;  Location: [facility];  Service: General;  Laterality: Right;     Review of Systems: A 10 point review of systems was conducted and is otherwise negative except for what is noted above.     Physical Exam: Blood pressure 130/82, pulse 113, temperature 98 F (36.7 C), temperature source Oral, resp. rate 18, height _0  (1.753 m), weight 245 lb 14.4 oz (111.54 kg). GENERAL: Patient is a well appearing female in no acute distress HEENT:  Sclerae anicteric.  Oropharynx clear and moist. No ulcerations or evidence of oropharyngeal candidiasis. Neck is supple.  NODES:  No cervical, supraclavicular, or axillary lymphadenopathy palpated.  BREAST EXAM:  Deferred. LUNGS:  Clear to  auscultation bilaterally.  No wheezes or rhonchi. HEART:  Regular rhythm, tachycardic No murmur appreciated. ABDOMEN:  Soft, nontender.  Positive, normoactive bowel sounds. No organomegaly palpated. MSK:  No focal spinal tenderness to palpation. Full range of motion bilaterally in the upper extremities. EXTREMITIES:  No peripheral edema.   SKIN:  Clear with no obvious rashes or skin changes. No nail dyscrasia. NEURO:  Nonfocal. Well oriented.  Appropriate affect. ECOG PERFORMANCE STATUS: 1 - Symptomatic but completely ambulatory   Lab Results: Lab Results  Component Value Date   WBC 4.7 06/15/2013   HGB 9.5* 06/15/2013   HCT 28.6* 06/15/2013   MCV 96.5 06/15/2013   PLT 215 06/15/2013     Chemistry      Component Value Date/Time   NA 141 06/15/2013 1206   NA 136 01/23/2013 1413   K 4.3 06/15/2013 1206   K 3.7 01/23/2013 1413   CL 102 01/23/2013 1413   CO2 25 06/15/2013 1206   CO2 27 01/23/2013 1413   BUN 7.4 06/15/2013 1206   BUN 12 01/23/2013 1413   CREATININE 0.7 06/15/2013 1206   CREATININE 0.68 01/23/2013 1413      Component Value Date/Time   CALCIUM 9.5 06/15/2013 1206   CALCIUM 9.3  01/23/2013 1413   ALKPHOS 69 06/15/2013 1206   ALKPHOS 74 01/23/2013 1413   AST 36* 06/15/2013 1206   AST 27 01/23/2013 1413   ALT 66* 06/15/2013 1206   ALT 30 01/23/2013 1413   BILITOT 1.72* 06/15/2013 1206   BILITOT 0.5 01/23/2013 1413       Assessment and Plan: [name] 38 y.o. female with   1.  Pathologic stage IIB invasive ductal carcinoma on the right breast.  The patient has underwent lumpectomy followed by adjuvant FEC x 6 cycles, and adjuvant Taxol x 7 cycles discontinued early due to hand foot syndrome.  She is doing well today.  Her CBC is improved.  I reviewed it with her in detail.  She will proceed to adjuvant radiation therapy.  I gave her information about Aromasin that she will start after radiation.    2.  Right lower extremity DVT.  Patient has been on Xarelto since December, 2014.  She has since had a doppler that is negative.  She will likely complete anticoagulant therapy towards the end of June, 2015.    3.  Grade III skin toxicity from Taxol.  Patient had severe hand foot syndrome from Taxol.  It is very much improved today.  She will continue to moisturize BID.    4.  Hyperbilirubinemia/elevated liver enzymes.  Her bilirubin and liver enzymes have increased again today.  They had previously increased while on Taxol, but however today they have elevated again today.  I have ordered an abdominal ultrasound.  I also recommended she stop taking Vicodin.    5.  Tachycardia.  I requested the patient have f/u with Dr. Haroldine Laws or Dr. Aundra Dubin regarding her recent tachycardia.    6.  Ovarian suppression.  The patient receives Zoladex every 4 weeks to suppress her ovaries as she will have to take Aromasin following radiation therapy due to the fact that she is no longer a candidate for Tamoxifen secondary to a DVT.  I requested a referral to gynecology for eval for TAH/BSO at the patient's request.    7. Neuropathy.  The patient will continue Gabapentin TID.  It is improving  her neuropathy.    The patient will return in 4 weeks for labs, evaluation and  her next Zoladex injection.  She knows to call us in the interim for any questions, concerns, and we can certainly see her sooner if needed.    I spent 25 minutes counseling the patient face to face.  The total time spent in the appointment was 30 minutes.  Minette Headland, [place] [phone] 06/16/2013 12:32 PM   ADDENDUM:    I personally saw this patient and performed a substantive portion of this encounter with the listed APP documented above.   Patient seems to be doing much better. However her liver enzymes and bilirubin are elevated. I have recommended ordering an ultrasound of the abdomen. Patient continues to be tachycardic and she will followup with her cardiologist. Patient is on Zoladex every 4 weeks to suppress her ovarian functions. Neuropathy is improving but she is on gabapentin 3 times a day.  Patient will be seen back in 4 weeks time in followup.  Marcy Panning, MD

## 2013-06-15 ENCOUNTER — Ambulatory Visit (HOSPITAL_BASED_OUTPATIENT_CLINIC_OR_DEPARTMENT_OTHER): Payer: BC Managed Care – PPO

## 2013-06-15 ENCOUNTER — Other Ambulatory Visit: Payer: Self-pay

## 2013-06-15 ENCOUNTER — Telehealth: Payer: Self-pay | Admitting: Oncology

## 2013-06-15 ENCOUNTER — Ambulatory Visit: Payer: BC Managed Care – PPO

## 2013-06-15 ENCOUNTER — Other Ambulatory Visit: Payer: Self-pay | Admitting: Oncology

## 2013-06-15 ENCOUNTER — Ambulatory Visit (HOSPITAL_BASED_OUTPATIENT_CLINIC_OR_DEPARTMENT_OTHER): Payer: BC Managed Care – PPO | Admitting: Adult Health

## 2013-06-15 ENCOUNTER — Encounter: Payer: Self-pay | Admitting: Adult Health

## 2013-06-15 ENCOUNTER — Other Ambulatory Visit: Payer: BC Managed Care – PPO

## 2013-06-15 ENCOUNTER — Other Ambulatory Visit (HOSPITAL_BASED_OUTPATIENT_CLINIC_OR_DEPARTMENT_OTHER): Payer: BC Managed Care – PPO

## 2013-06-15 VITALS — BP 130/82 | HR 113 | Temp 98.0°F | Resp 18 | Ht 69.0 in | Wt 245.9 lb

## 2013-06-15 DIAGNOSIS — Z5111 Encounter for antineoplastic chemotherapy: Secondary | ICD-10-CM

## 2013-06-15 DIAGNOSIS — C50319 Malignant neoplasm of lower-inner quadrant of unspecified female breast: Secondary | ICD-10-CM

## 2013-06-15 DIAGNOSIS — E289 Ovarian dysfunction, unspecified: Secondary | ICD-10-CM

## 2013-06-15 DIAGNOSIS — R17 Unspecified jaundice: Secondary | ICD-10-CM

## 2013-06-15 DIAGNOSIS — C50312 Malignant neoplasm of lower-inner quadrant of left female breast: Secondary | ICD-10-CM

## 2013-06-15 DIAGNOSIS — G5690 Unspecified mononeuropathy of unspecified upper limb: Secondary | ICD-10-CM

## 2013-06-15 DIAGNOSIS — R748 Abnormal levels of other serum enzymes: Secondary | ICD-10-CM

## 2013-06-15 DIAGNOSIS — D649 Anemia, unspecified: Secondary | ICD-10-CM

## 2013-06-15 DIAGNOSIS — I82409 Acute embolism and thrombosis of unspecified deep veins of unspecified lower extremity: Secondary | ICD-10-CM

## 2013-06-15 DIAGNOSIS — R000 Tachycardia, unspecified: Secondary | ICD-10-CM

## 2013-06-15 LAB — CBC WITH DIFFERENTIAL/PLATELET
BASO%: 0.7 % (ref 0.0–2.0)
Basophils Absolute: 0 10*3/uL (ref 0.0–0.1)
EOS ABS: 0.1 10*3/uL (ref 0.0–0.5)
EOS%: 1.3 % (ref 0.0–7.0)
HEMATOCRIT: 28.6 % — AB (ref 34.8–46.6)
HGB: 9.5 g/dL — ABNORMAL LOW (ref 11.6–15.9)
LYMPH%: 39.7 % (ref 14.0–49.7)
MCH: 32 pg (ref 25.1–34.0)
MCHC: 33.1 g/dL (ref 31.5–36.0)
MCV: 96.5 fL (ref 79.5–101.0)
MONO#: 0.6 10*3/uL (ref 0.1–0.9)
MONO%: 12.3 % (ref 0.0–14.0)
NEUT%: 46 % (ref 38.4–76.8)
NEUTROS ABS: 2.2 10*3/uL (ref 1.5–6.5)
PLATELETS: 215 10*3/uL (ref 145–400)
RBC: 2.96 10*6/uL — ABNORMAL LOW (ref 3.70–5.45)
RDW: 22.9 % — ABNORMAL HIGH (ref 11.2–14.5)
WBC: 4.7 10*3/uL (ref 3.9–10.3)
lymph#: 1.9 10*3/uL (ref 0.9–3.3)

## 2013-06-15 LAB — COMPREHENSIVE METABOLIC PANEL (CC13)
ALBUMIN: 3.4 g/dL — AB (ref 3.5–5.0)
ALT: 66 U/L — AB (ref 0–55)
AST: 36 U/L — AB (ref 5–34)
Alkaline Phosphatase: 69 U/L (ref 40–150)
Anion Gap: 10 mEq/L (ref 3–11)
BUN: 7.4 mg/dL (ref 7.0–26.0)
CALCIUM: 9.5 mg/dL (ref 8.4–10.4)
CHLORIDE: 106 meq/L (ref 98–109)
CO2: 25 mEq/L (ref 22–29)
Creatinine: 0.7 mg/dL (ref 0.6–1.1)
Glucose: 149 mg/dl — ABNORMAL HIGH (ref 70–140)
Potassium: 4.3 mEq/L (ref 3.5–5.1)
Sodium: 141 mEq/L (ref 136–145)
Total Bilirubin: 1.72 mg/dL — ABNORMAL HIGH (ref 0.20–1.20)
Total Protein: 6.3 g/dL — ABNORMAL LOW (ref 6.4–8.3)

## 2013-06-15 LAB — BILIRUBIN, DIRECT: Bilirubin, Direct: <0.1 mg/dL (ref 0.0–0.3)

## 2013-06-15 LAB — FECAL OCCULT BLOOD, GUAIAC: Occult Blood: NEGATIVE

## 2013-06-15 MED ORDER — GOSERELIN ACETATE 3.6 MG ~~LOC~~ IMPL
3.6000 mg | DRUG_IMPLANT | Freq: Once | SUBCUTANEOUS | Status: AC
  Administered 2013-06-15: 3.6 mg via SUBCUTANEOUS
  Filled 2013-06-15: qty 3.6

## 2013-06-15 NOTE — Patient Instructions (Signed)
Exemestane tablets What is this medicine? EXEMESTANE (ex e MES tane) blocks the production of the hormone estrogen. Some types of breast cancer depend on estrogen to grow, and this medicine can stop tumor growth by blocking estrogen production. This medicine is for the treatment of breast cancer in postmenopausal women only. This medicine may be used for other purposes; ask your health care provider or pharmacist if you have questions. COMMON BRAND NAME(S): Aromasin What should I tell my health care provider before I take this medicine? They need to know if you have any of these conditions: -an unusual or allergic reaction to exemestane, other medicines, foods, dyes, or preservatives -pregnant or trying to get pregnant -breast-feeding How should I use this medicine? Take this medicine by mouth with a glass of water. Follow the directions on the prescription label. Take your doses at regular intervals after a meal. Do not take your medicine more often than directed. Do not stop taking except on the advice of your doctor or health care professional. Contact your pediatrician regarding the use of this medicine in children. Special care may be needed. Overdosage: If you think you have taken too much of this medicine contact a poison control center or emergency room at once. NOTE: This medicine is only for you. Do not share this medicine with others. What if I miss a dose? If you miss a dose, take the next dose as usual. Do not try to make up the missed dose. Do not take double or extra doses. What may interact with this medicine? Do not take this medicine with any of the following medications: -female hormones, like estrogens and birth control pills This medicine may also interact with the following medications: -androstenedione -phenytoin -rifabutin, rifampin, or rifapentine -St. John's Wort This list may not describe all possible interactions. Give your health care provider a list of all the  medicines, herbs, non-prescription drugs, or dietary supplements you use. Also tell them if you smoke, drink alcohol, or use illegal drugs. Some items may interact with your medicine. What should I watch for while using this medicine? Visit your doctor or health care professional for regular checks on your progress. If you experience hot flashes or sweating while taking this medicine, avoid alcohol, smoking and drinks with caffeine. This may help to decrease these side effects. What side effects may I notice from receiving this medicine? Side effects that you should report to your doctor or health care professional as soon as possible: -any new or unusual symptoms -changes in vision -fever -leg or arm swelling -pain in bones, joints, or muscles -pain in hips, back, ribs, arms, shoulders, or legs Side effects that usually do not require medical attention (report to your doctor or health care professional if they continue or are bothersome): -difficulty sleeping -headache -hot flashes -sweating -unusually weak or tired This list may not describe all possible side effects. Call your doctor for medical advice about side effects. You may report side effects to FDA at 1-800-FDA-1088. Where should I keep my medicine? Keep out of the reach of children. Store at room temperature between 15 and 30 degrees C (59 and 86 degrees F). Throw away any unused medicine after the expiration date. NOTE: This sheet is a summary. It may not cover all possible information. If you have questions about this medicine, talk to your doctor, pharmacist, or health care provider.  2014, Elsevier/Gold Standard. (2007-07-08 11:48:29)

## 2013-06-15 NOTE — Progress Notes (Signed)
Lab request - pt brought in sample for test.  Lab orders incorrect.  Correct lab orders placed.

## 2013-06-15 NOTE — Telephone Encounter (Signed)
, °

## 2013-06-17 ENCOUNTER — Other Ambulatory Visit: Payer: Self-pay

## 2013-06-19 ENCOUNTER — Other Ambulatory Visit: Payer: BC Managed Care – PPO

## 2013-06-19 ENCOUNTER — Ambulatory Visit: Payer: BC Managed Care – PPO | Admitting: Adult Health

## 2013-06-19 ENCOUNTER — Encounter: Payer: Self-pay | Admitting: *Deleted

## 2013-06-19 ENCOUNTER — Ambulatory Visit: Payer: BC Managed Care – PPO

## 2013-06-19 NOTE — Progress Notes (Signed)
[place] Psychosocial Distress Screening Clinical Social Work  Clinical Social Work was referred by distress screening protocol.  The patient scored a 5 on the Psychosocial Distress Thermometer which indicates moderate distress. Clinical Social Worker phoned pt to assess for distress and other psychosocial needs. Pt reports her issue was related to physical concerns and feels these have resolved recently. Pt was at work and not really able to talk, but agrees to call CSW back and discuss further if needed. Pt aware of resources at [facility] to assist.    Clinical Social Worker follow up needed: no  Loren Racer, [place] S. [place] for [place] Wednesday, Thursday and Friday Phone: [phone] Fax: [phone]

## 2013-06-22 ENCOUNTER — Ambulatory Visit: Payer: BC Managed Care – PPO | Admitting: Radiation Oncology

## 2013-06-24 ENCOUNTER — Ambulatory Visit (HOSPITAL_COMMUNITY)
Admission: RE | Admit: 2013-06-24 | Discharge: 2013-06-24 | Disposition: A | Discharge status: Home / Self Care | Payer: BC Managed Care – PPO | Source: Ambulatory Visit | Attending: Adult Health | Admitting: Adult Health

## 2013-06-24 DIAGNOSIS — N281 Cyst of kidney, acquired: Secondary | ICD-10-CM | POA: Insufficient documentation

## 2013-06-24 DIAGNOSIS — K7689 Other specified diseases of liver: Secondary | ICD-10-CM | POA: Insufficient documentation

## 2013-06-24 DIAGNOSIS — R748 Abnormal levels of other serum enzymes: Secondary | ICD-10-CM

## 2013-06-24 DIAGNOSIS — Z853 Personal history of malignant neoplasm of breast: Secondary | ICD-10-CM | POA: Insufficient documentation

## 2013-06-24 DIAGNOSIS — R945 Abnormal results of liver function studies: Secondary | ICD-10-CM | POA: Insufficient documentation

## 2013-06-25 ENCOUNTER — Ambulatory Visit
Admission: RE | Admit: 2013-06-25 | Discharge: 2013-06-25 | Disposition: A | Discharge status: Home / Self Care | Payer: BC Managed Care – PPO | Source: Ambulatory Visit | Attending: Radiation Oncology | Admitting: Radiation Oncology

## 2013-06-25 DIAGNOSIS — C50319 Malignant neoplasm of lower-inner quadrant of unspecified female breast: Secondary | ICD-10-CM

## 2013-06-25 NOTE — Progress Notes (Signed)
Complex simulation/treatment planning note: The patient was taken to the CT simulator. A custom Vac Loc was constructed on a custom breast board for immobilization. Her left breast borders were marked with radiopaque wires. Her partial mastectomy scar was marked. She was then scanned free breathing. The tangential fields to first the cardiac silhouette, and thus she was rescanned in and with deep inspiration/breath-hold. There was significantly displacement of cardiac silhouette away from her tangential fields. The CT data set was sent to the planning system for contouring of her tumor bed, heart, lungs, and internal mammary lymph nodes. She was set up to medial and lateral breast tangents. She is also set up RAO to a supraclavicular field, and PA to her left axilla. I prescribing 4680 cGy and 26 sessions to her left breast and regional lymph nodes with a PA axillary field bringing the midaxillary plane dose up to 4500 cGy in 26 sessions. She then undergo a left breast boost for a further 1400 cGy in 7 sessions. She is now ready for 3-D simulation. She'll be treated with optical guidance/align RT.

## 2013-06-26 ENCOUNTER — Ambulatory Visit: Payer: BC Managed Care – PPO

## 2013-06-26 ENCOUNTER — Ambulatory Visit: Payer: BC Managed Care – PPO | Admitting: Adult Health

## 2013-06-26 ENCOUNTER — Other Ambulatory Visit: Payer: BC Managed Care – PPO

## 2013-06-29 ENCOUNTER — Other Ambulatory Visit (HOSPITAL_COMMUNITY): Payer: Self-pay | Admitting: Anesthesiology

## 2013-06-29 DIAGNOSIS — C50919 Malignant neoplasm of unspecified site of unspecified female breast: Secondary | ICD-10-CM

## 2013-07-02 ENCOUNTER — Ambulatory Visit (HOSPITAL_COMMUNITY)
Admission: RE | Admit: 2013-07-02 | Discharge: 2013-07-02 | Disposition: A | Discharge status: Home / Self Care | Payer: BC Managed Care – PPO | Source: Ambulatory Visit | Attending: Internal Medicine | Admitting: Internal Medicine

## 2013-07-02 ENCOUNTER — Ambulatory Visit (HOSPITAL_BASED_OUTPATIENT_CLINIC_OR_DEPARTMENT_OTHER)
Admission: RE | Admit: 2013-07-02 | Discharge: 2013-07-02 | Disposition: A | Discharge status: Home / Self Care | Payer: BC Managed Care – PPO | Source: Ambulatory Visit | Attending: Internal Medicine | Admitting: Internal Medicine

## 2013-07-02 ENCOUNTER — Ambulatory Visit
Admission: RE | Admit: 2013-07-02 | Discharge: 2013-07-02 | Disposition: A | Discharge status: Home / Self Care | Payer: BC Managed Care – PPO | Source: Ambulatory Visit | Attending: Radiation Oncology | Admitting: Radiation Oncology

## 2013-07-02 ENCOUNTER — Encounter (HOSPITAL_COMMUNITY): Payer: Self-pay

## 2013-07-02 VITALS — BP 118/78 | HR 96 | Wt 248.0 lb

## 2013-07-02 DIAGNOSIS — I517 Cardiomegaly: Secondary | ICD-10-CM

## 2013-07-02 DIAGNOSIS — R000 Tachycardia, unspecified: Secondary | ICD-10-CM

## 2013-07-02 DIAGNOSIS — C50319 Malignant neoplasm of lower-inner quadrant of unspecified female breast: Secondary | ICD-10-CM

## 2013-07-02 DIAGNOSIS — C50919 Malignant neoplasm of unspecified site of unspecified female breast: Secondary | ICD-10-CM | POA: Insufficient documentation

## 2013-07-02 NOTE — Addendum Note (Signed)
Encounter addended by: Scarlette Calico, RN on: 07/02/2013 12:04 PM<BR>     Documentation filed: Patient Instructions Section

## 2013-07-02 NOTE — Progress Notes (Signed)
Patient ID: [name], female   DOB: 1976-03-17, 38 y.o.   MRN: [id number]  Oncologist: Dr Jana Hakim PCP: none  HPI: [name] is a 38 y.o. 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 Jana Hakim  She was diagnosed in 7/14 with stage II, invasive ductal carcinoma of the left breast, grade 3, estrogen receptor/progesterone receptor positive, Ki-67 25%, HER-2/neu negative. Started adjuvant chemotherapy on 01/23/2013 consisting of FEC (5-FU/Epirubicin/Cytoxan) with Neulasta support to be given every 2 weeks for a total of 6 cycles. Which she completes. Had 7/12 cycles of Taxol but stopped due to side effects.  ECHO 01/12/13 EF 50-55%  Lateral s' 12.4 ECHO 03/06/13 EF 60% Lateral s' 12.1 Global Strain -18.9 ECHO 07/02/13  EF 60-65% Lateral s' 12.0 GLS -15  Doing well.  Finished chemo completely. HR remains high feels it started with Zoaldex. Denies SOB/Orthopnea. Does snore but denies OSA. Had RLE DVT and on Xarelto.   FH: Grandma had MI SH: Quit smoking 11/2012. Occasional alcohol. Lives at home with 68 year old daughter  Current Outpatient Prescriptions  Medication Sig Dispense Refill  . ALPRAZolam (XANAX) 0.25 MG tablet Take 1 tablet (0.25 mg total) by mouth at bedtime as needed for sleep.  30 tablet  0  . furosemide (LASIX) 20 MG tablet Take 40 mg by mouth daily as needed.      . gabapentin (NEURONTIN) 300 MG capsule Take 1 capsule (300 mg total) by mouth 2 (two) times daily.  60 capsule  6  . lidocaine-prilocaine (EMLA) cream Apply topically as needed.  30 g  0  . lisinopril (PRINIVIL,ZESTRIL) 10 MG tablet Take 10 mg by mouth daily.      Marland Kitchen LORazepam (ATIVAN) 0.5 MG tablet Take 1 tablet (0.5 mg total) by mouth every 6 (six) hours as needed (Nausea or vomiting).  30 tablet  0  . omeprazole (PRILOSEC) 20 MG capsule TAKE ONE CAPSULE BY MOUTH EVERY DAY  30 capsule  2  .  ondansetron (ZOFRAN) 8 MG tablet Take 1 tablet (8 mg total) by mouth 2 (two) times daily. Take two times a day starting the day after chemo for 2 days. Then take two times a day as needed for nausea or vomiting.  30 tablet  1  . potassium chloride SA (K-DUR,KLOR-CON) 20 MEQ tablet Take 20 mEq by mouth daily as needed.      . prochlorperazine (COMPAZINE) 10 MG tablet Take 1 tablet (10 mg total) by mouth every 6 (six) hours as needed (Nausea or vomiting).  30 tablet  1  . Rivaroxaban (XARELTO) 20 MG TABS tablet Take 1 tablet (20 mg total) by mouth daily with supper.  30 tablet  6   No current facility-administered medications for this encounter.     Allergies  Allergen Reactions  . Amoxicillin Other (See Comments)    States "it does not work"    History   Social History  . Marital Status: Divorced    Spouse Name: N/A    Number of Children: N/A  . Years of Education: N/A   Occupational History  . Not on file.   Social History Main Topics  . Smoking status: Former Smoker    Quit date: 12/03/2012  . Smokeless tobacco: Never Used  . Alcohol Use: Yes     Comment: occasionally  . Drug Use: No  . Sexual Activity: Yes    Birth  Control/ Protection: Condom     Comment: menarche age 89, P68 @ age 5,  last menstrual cycle 09/29/12   Other Topics Concern  . Not on file   Social History Narrative  . No narrative on file    Family History  Problem Relation Age of Onset  . Breast cancer Maternal Grandmother   . Lung cancer Paternal Grandmother   . Anesthesia problems Mother     post-op N/V    PHYSICAL EXAM: Filed Vitals:   07/02/13 1137  BP: 118/78  Pulse: 105   General:  Well appearing. No respiratory difficulty HEENT: normal x for alopecia Neck: supple. no JVD. Carotids 2+ bilat; no bruits. No lymphadenopathy or thryomegaly appreciated. Cor: PMI nondisplaced. Regular rate & rhythm. No rubs, gallops 2/6 SEM at LSB Lungs: clear Abdomen: soft, nontender, nondistended. No  hepatosplenomegaly. No bruits or masses. Good bowel sounds. Extremities: no cyanosis, clubbing, rash, tr edema on RLE Neuro: alert & oriented x 3, cranial nerves grossly intact. moves all 4 extremities w/o difficulty. Affect pleasant.    No results found for this or any previous visit (from the past 24 hour(s)). No results found.   ASSESSMENT & PLAN: 1. L breast cancer- grade 3, estrogen receptor/progesterone receptor positive, Ki-67 25%, HER-2/neu negative. Has finished chemo. I reviewed echos personally. EF and Doppler parameters stable. No HF on exam.   2. Tachycardia. - suspect it may be due to Zoladex. Tolerable at this point. It is possible that she could have had a small PE from her DVT but her echo doesn't support this. Will continue to follow.  Can f/u PRN.   Shaune Pascal Roseanna Koplin,MD 11:52 AM

## 2013-07-02 NOTE — Progress Notes (Signed)
Simulation verification note: The patient underwent simulation verification for treatment to her left breast and regional lymph nodes.  Her isocenter is in good position and the multileaf collimators contoured the treatment volume appropriately. 

## 2013-07-02 NOTE — Patient Instructions (Signed)
Follow up as needed

## 2013-07-02 NOTE — Progress Notes (Signed)
  Echocardiogram 2D Echocardiogram has been performed.  [name] 07/02/2013, 11:10 AM

## 2013-07-03 ENCOUNTER — Ambulatory Visit: Payer: BC Managed Care – PPO | Admitting: Adult Health

## 2013-07-03 ENCOUNTER — Other Ambulatory Visit: Payer: BC Managed Care – PPO

## 2013-07-03 ENCOUNTER — Ambulatory Visit: Payer: BC Managed Care – PPO

## 2013-07-06 ENCOUNTER — Ambulatory Visit
Admission: RE | Admit: 2013-07-06 | Discharge: 2013-07-06 | Disposition: A | Discharge status: Home / Self Care | Payer: BC Managed Care – PPO | Source: Ambulatory Visit | Attending: Radiation Oncology | Admitting: Radiation Oncology

## 2013-07-06 ENCOUNTER — Encounter: Payer: Self-pay | Admitting: Radiation Oncology

## 2013-07-06 VITALS — BP 141/86 | HR 101 | Temp 98.6°F | Ht 69.0 in | Wt 245.9 lb

## 2013-07-06 DIAGNOSIS — C50319 Malignant neoplasm of lower-inner quadrant of unspecified female breast: Secondary | ICD-10-CM

## 2013-07-06 NOTE — Progress Notes (Signed)
Weekly Management Note:  Site: Left breast and regional lymph nodes Current Dose:  180  cGy Projected Dose: 4680  cGy followed by 7 fraction left breast boost  Narrative: The patient is seen today for routine under treatment assessment. CBCT/MVCT images/port films were reviewed. The chart was reviewed.   She begins radiation therapy today. She was given Radioplex gel to use when necessary.  Physical Examination:  Filed Vitals:   07/06/13 1911  BP: 141/86  Pulse: 101  Temp: 98.6 F (37 C)  .  Weight: 245 lb 14.4 oz (111.54 kg). No skin changes.  Impression: Tolerating radiation therapy well.  Plan: Continue radiation therapy as planned.

## 2013-07-06 NOTE — Progress Notes (Signed)
Post sim Ed:  Reviewed side-effect management regarding radiation to her breast inclusive of skin changes, fatigue and pain.  May have a sore throat since the supraclavicular region is in the treatment field.  Given Radiaplex Gel with instructions to use either BID or TID, but with instructions to apply at least 4 hours prior to treatment. Also given Alra deodorant with instructions to apply in one small area over a four day period to ensure she does not have an intolerance. Given the Radiation therapy and You Booklet with the appropriate pages marked.  Given business card for Dr. Charlton Amor nurse and given this RN's card.

## 2013-07-06 NOTE — Progress Notes (Signed)
Ms. Escalera has received 1 fractions to her left breast.  No concerns at the moment.

## 2013-07-07 ENCOUNTER — Ambulatory Visit
Admission: RE | Admit: 2013-07-07 | Discharge: 2013-07-07 | Disposition: A | Discharge status: Home / Self Care | Payer: BC Managed Care – PPO | Source: Ambulatory Visit | Attending: Radiation Oncology | Admitting: Radiation Oncology

## 2013-07-08 ENCOUNTER — Ambulatory Visit
Admission: RE | Admit: 2013-07-08 | Discharge: 2013-07-08 | Disposition: A | Discharge status: Home / Self Care | Payer: BC Managed Care – PPO | Source: Ambulatory Visit | Attending: Radiation Oncology | Admitting: Radiation Oncology

## 2013-07-09 ENCOUNTER — Ambulatory Visit
Admission: RE | Admit: 2013-07-09 | Discharge: 2013-07-09 | Disposition: A | Discharge status: Home / Self Care | Payer: BC Managed Care – PPO | Source: Ambulatory Visit | Attending: Radiation Oncology | Admitting: Radiation Oncology

## 2013-07-10 ENCOUNTER — Ambulatory Visit
Admission: RE | Admit: 2013-07-10 | Discharge: 2013-07-10 | Disposition: A | Discharge status: Home / Self Care | Payer: BC Managed Care – PPO | Source: Ambulatory Visit | Attending: Radiation Oncology | Admitting: Radiation Oncology

## 2013-07-11 ENCOUNTER — Other Ambulatory Visit: Payer: Self-pay | Admitting: Oncology

## 2013-07-13 ENCOUNTER — Ambulatory Visit (HOSPITAL_BASED_OUTPATIENT_CLINIC_OR_DEPARTMENT_OTHER): Payer: BC Managed Care – PPO

## 2013-07-13 ENCOUNTER — Ambulatory Visit (HOSPITAL_BASED_OUTPATIENT_CLINIC_OR_DEPARTMENT_OTHER): Payer: BC Managed Care – PPO | Admitting: Hematology and Oncology

## 2013-07-13 ENCOUNTER — Other Ambulatory Visit (HOSPITAL_BASED_OUTPATIENT_CLINIC_OR_DEPARTMENT_OTHER): Payer: BC Managed Care – PPO

## 2013-07-13 ENCOUNTER — Encounter: Payer: Self-pay | Admitting: Radiation Oncology

## 2013-07-13 ENCOUNTER — Telehealth: Payer: Self-pay | Admitting: Adult Health

## 2013-07-13 ENCOUNTER — Ambulatory Visit
Admission: RE | Admit: 2013-07-13 | Discharge: 2013-07-13 | Disposition: A | Discharge status: Home / Self Care | Payer: BC Managed Care – PPO | Source: Ambulatory Visit | Attending: Radiation Oncology | Admitting: Radiation Oncology

## 2013-07-13 VITALS — BP 128/87 | HR 99 | Temp 98.3°F | Resp 18 | Ht 69.0 in | Wt 243.8 lb

## 2013-07-13 VITALS — BP 127/80 | HR 96 | Temp 98.1°F | Resp 20 | Wt 244.3 lb

## 2013-07-13 DIAGNOSIS — C50919 Malignant neoplasm of unspecified site of unspecified female breast: Secondary | ICD-10-CM

## 2013-07-13 DIAGNOSIS — I82409 Acute embolism and thrombosis of unspecified deep veins of unspecified lower extremity: Secondary | ICD-10-CM

## 2013-07-13 DIAGNOSIS — C50319 Malignant neoplasm of lower-inner quadrant of unspecified female breast: Secondary | ICD-10-CM

## 2013-07-13 DIAGNOSIS — R000 Tachycardia, unspecified: Secondary | ICD-10-CM

## 2013-07-13 DIAGNOSIS — C50312 Malignant neoplasm of lower-inner quadrant of left female breast: Secondary | ICD-10-CM

## 2013-07-13 DIAGNOSIS — Z5111 Encounter for antineoplastic chemotherapy: Secondary | ICD-10-CM

## 2013-07-13 LAB — CBC WITH DIFFERENTIAL/PLATELET
BASO%: 0.8 % (ref 0.0–2.0)
BASOS ABS: 0.1 10*3/uL (ref 0.0–0.1)
EOS%: 2.3 % (ref 0.0–7.0)
Eosinophils Absolute: 0.2 10*3/uL (ref 0.0–0.5)
HCT: 36.5 % (ref 34.8–46.6)
HEMOGLOBIN: 12.2 g/dL (ref 11.6–15.9)
LYMPH#: 1.7 10*3/uL (ref 0.9–3.3)
LYMPH%: 24.2 % (ref 14.0–49.7)
MCH: 32 pg (ref 25.1–34.0)
MCHC: 33.5 g/dL (ref 31.5–36.0)
MCV: 95.3 fL (ref 79.5–101.0)
MONO#: 0.4 10*3/uL (ref 0.1–0.9)
MONO%: 6.1 % (ref 0.0–14.0)
NEUT#: 4.7 10*3/uL (ref 1.5–6.5)
NEUT%: 66.6 % (ref 38.4–76.8)
Platelets: 276 10*3/uL (ref 145–400)
RBC: 3.83 10*6/uL (ref 3.70–5.45)
RDW: 16.5 % — ABNORMAL HIGH (ref 11.2–14.5)
WBC: 7.1 10*3/uL (ref 3.9–10.3)

## 2013-07-13 LAB — COMPREHENSIVE METABOLIC PANEL (CC13)
ALT: 63 U/L — AB (ref 0–55)
AST: 53 U/L — AB (ref 5–34)
Albumin: 3.5 g/dL (ref 3.5–5.0)
Alkaline Phosphatase: 67 U/L (ref 40–150)
Anion Gap: 9 mEq/L (ref 3–11)
BUN: 7.1 mg/dL (ref 7.0–26.0)
CHLORIDE: 105 meq/L (ref 98–109)
CO2: 25 mEq/L (ref 22–29)
Calcium: 9.5 mg/dL (ref 8.4–10.4)
Creatinine: 0.8 mg/dL (ref 0.6–1.1)
Glucose: 154 mg/dl — ABNORMAL HIGH (ref 70–140)
POTASSIUM: 3.7 meq/L (ref 3.5–5.1)
Sodium: 139 mEq/L (ref 136–145)
TOTAL PROTEIN: 6.3 g/dL — AB (ref 6.4–8.3)
Total Bilirubin: 0.7 mg/dL (ref 0.20–1.20)

## 2013-07-13 MED ORDER — LISINOPRIL 10 MG PO TABS: 10.0000 mg | ORAL_TABLET | Freq: Every day | ORAL | Status: DC

## 2013-07-13 MED ORDER — GOSERELIN ACETATE 3.6 MG ~~LOC~~ IMPL
3.6000 mg | DRUG_IMPLANT | Freq: Once | SUBCUTANEOUS | Status: AC
  Administered 2013-07-13: 3.6 mg via SUBCUTANEOUS
  Filled 2013-07-13: qty 3.6

## 2013-07-13 NOTE — Telephone Encounter (Signed)
, °

## 2013-07-13 NOTE — Patient Instructions (Signed)
Goserelin injection What is this medicine? GOSERELIN (GOE se rel in) is similar to a hormone found in the body. It lowers the amount of sex hormones that the body makes. Men will have lower testosterone levels and women will have lower estrogen levels while taking this medicine. In men, this medicine is used to treat prostate cancer; the injection is either given once per month or once every 12 weeks. A once per month injection (only) is used to treat women with endometriosis, dysfunctional uterine bleeding, or advanced breast cancer. This medicine may be used for other purposes; ask your health care provider or pharmacist if you have questions. COMMON BRAND NAME(S): Zoladex What should I tell my health care provider before I take this medicine? They need to know if you have any of these conditions (some only apply to women): -diabetes -heart disease or previous heart attack -high blood pressure -high cholesterol -kidney disease -osteoporosis or low bone density -problems passing urine -spinal cord injury -stroke -tobacco smoker -an unusual or allergic reaction to goserelin, hormone therapy, other medicines, foods, dyes, or preservatives -pregnant or trying to get pregnant -breast-feeding How should I use this medicine? This medicine is for injection under the skin. It is given by a health care professional in a hospital or clinic setting. Men receive this injection once every 4 weeks or once every 12 weeks. Women will only receive the once every 4 weeks injection. Talk to your pediatrician regarding the use of this medicine in children. Special care may be needed. Overdosage: If you think you have taken too much of this medicine contact a poison control center or emergency room at once. NOTE: This medicine is only for you. Do not share this medicine with others. What if I miss a dose? It is important not to miss your dose. Call your doctor or health care professional if you are unable to  keep an appointment. What may interact with this medicine? -female hormones like estrogen -herbal or dietary supplements like black cohosh, chasteberry, or DHEA -female hormones like testosterone -prasterone This list may not describe all possible interactions. Give your health care provider a list of all the medicines, herbs, non-prescription drugs, or dietary supplements you use. Also tell them if you smoke, drink alcohol, or use illegal drugs. Some items may interact with your medicine. What should I watch for while using this medicine? Visit your doctor or health care professional for regular checks on your progress. Your symptoms may appear to get worse during the first weeks of this therapy. Tell your doctor or healthcare professional if your symptoms do not start to get better or if they get worse after this time. Your bones may get weaker if you take this medicine for a long time. If you smoke or frequently drink alcohol you may increase your risk of bone loss. A family history of osteoporosis, chronic use of drugs for seizures (convulsions), or corticosteroids can also increase your risk of bone loss. Talk to your doctor about how to keep your bones strong. This medicine should stop regular monthly menstration in women. Tell your doctor if you continue to menstrate. Women should not become pregnant while taking this medicine or for 12 weeks after stopping this medicine. Women should inform their doctor if they wish to become pregnant or think they might be pregnant. There is a potential for serious side effects to an unborn child. Talk to your health care professional or pharmacist for more information. Do not breast-feed an infant while taking   this medicine. Men should inform their doctors if they wish to father a child. This medicine may lower sperm counts. Talk to your health care professional or pharmacist for more information. What side effects may I notice from receiving this  medicine? Side effects that you should report to your doctor or health care professional as soon as possible: -allergic reactions like skin rash, itching or hives, swelling of the face, lips, or tongue -bone pain -breathing problems -changes in vision -chest pain -feeling faint or lightheaded, falls -fever, chills -pain, swelling, warmth in the leg -pain, tingling, numbness in the hands or feet -swelling of the ankles, feet, hands -trouble passing urine or change in the amount of urine -unusually high or low blood pressure -unusually weak or tired Side effects that usually do not require medical attention (report to your doctor or health care professional if they continue or are bothersome): -change in sex drive or performance -changes in breast size in both males and females -changes in emotions or moods -headache -hot flashes -irritation at site where injected -loss of appetite -skin problems like acne, dry skin -vaginal dryness This list may not describe all possible side effects. Call your doctor for medical advice about side effects. You may report side effects to FDA at 1-800-FDA-1088. Where should I keep my medicine? This drug is given in a hospital or clinic and will not be stored at home. NOTE: This sheet is a summary. It may not cover all possible information. If you have questions about this medicine, talk to your doctor, pharmacist, or health care provider.  2014, [employer]vier/Gold Standard. (2008-07-20 13:28:29)  

## 2013-07-13 NOTE — Progress Notes (Signed)
Pt denies pain, fatigue , loss of appetite. She is applying Radiaplex to left breast treatment area; no skin pigment changes at this time. Pt no longer taking Gabapentin, states her "toes are numb but not painful".

## 2013-07-13 NOTE — Progress Notes (Signed)
Weekly Management Note:  Site: Left breast and regional lymph nodes Current Dose:  1080  cGy Projected Dose: 4680  cGy, followed by 7 fraction boost   Narrative: The patient is seen today for routine under treatment assessment. CBCT/MVCT images/port films were reviewed. The chart was reviewed.   No complaints today. She discontinued gabapentin since she is not having any toe pain. She has not started Radioplex gel.  Physical Examination:  Filed Vitals:   07/13/13 1354  BP: 127/80  Pulse: 96  Temp: 98.1 F (36.7 C)  Resp: 20  .  Weight: 244 lb 4.8 oz (110.814 kg). No significant skin changes.  Impression: Tolerating radiation therapy well.  Plan: Continue radiation therapy as planned.

## 2013-07-13 NOTE — Progress Notes (Signed)
Hematology and Oncology Follow Up Visit  [name] [id number] 1976-02-23 38 y.o. 07/13/2013 4:03 PM   Chief complaint: followup visit for breast cancer  Principle Diagnosis:[name] 38 y.o. female with pathologic stage IIB ER/PR positive, HER-2/neu negative invasive ductal carcinoma of the left breast.     Prior Therapy: Per previously dictated note: #1 A screening mammogram in [place], [place] on 09/23/2012 she was felt to have a new mass within the left breast. An ultrasound on 10/06/2012 showed a 1.6 x 1.3 x 1.3 cm mass at the 8 o'clock position 7 cm from the nipple. At the 12 o'clock position behind and nipple was a circumscribed oval shaped 2 x 1.8 x 1.2 cm lesion perhaps representing a fibroadenoma. 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 The carcinoma was grade 2-3, estrogen receptor 80% positive, progesterone receptor 80% positive, Ki-67 25%, and HER-2/neu negative. Bilateral breast MRI on 10/22/2012 showed biopsy proven carcinoma in the left lower inner quadrant identified and measures approximately 1.9 x 1.9 x 1.8 cm. Well-defined nodule in the right upper outer quadrant posteriorly consistent with a fibroadenoma. Probable liver cysts.There were multiple scattered nonspecific foci in each breast. No other suspicious abnormality was identified. No abnormal appearing lymph nodes (Clinical stage I, T1 N0).   #2 Status post left breast lumpectomy with left axillary sentinel lymph node biopsy on 12/10/2012 for a stage IIB, pT2, pN45m, MX, 2.3 cm invasive ductal carcinoma, grade 3, estrogen receptor 80% positive, progesterone receptor 80% positive, Ki-67 25%, HER-2/neu negative, with 1/2 left axillary sentinel lymph nodes positive for microscopic disease.   [place]1 and BRCA2  negative.   #4 s/p Adjuvant chemotherapy consisting of FEC (5-FU/epirubicin/Cytoxan) began on 01/23/2013. She completed 6 cycles of FEC on 04/03/13.   #5. S/p adjuvant weekly Taxol beginning 04/17/13 - 06/05/13 x 7 of the 12 planned cycles. Chemotherapy stopped due to grade III skin toxicity.   #6 Right lower extremity DVT diagnosed on 03/16/2013, patient was placed on Xarelto 143mBID. Her dose was changed to 20108maily on 04/10/13. Repeat venous Doppler of the lower extremity performed in March 2015 was negative for any thrombosis   Current therapy:  radiation therapy started- day #6  Interim History: SheLevi Cox 32o. female is here for f/u visit and monthly Zoladex injection. She says that after she gets Zoladex injection she gets muscle aches and also her heart rate increases. She is tolerating radiation therapy well. She is on xarelto for her lower extremity thrombosis. She denies any nosebleed, gum bleed, blood in the stool blood in the urine and she is wondering if she can be off of xarelto. Recent venous Doppler of the lower extremity performed in March 2015 was negative for any thrombosis. She says that her appetite is much improved. she denies any fever shortness of breath, chest pain and  palpitations    Medications:  Current Outpatient Prescriptions  Medication Sig Dispense Refill  . ALPRAZolam (XANAX) 0.25 MG tablet Take 1 tablet (0.25 mg total) by mouth at bedtime as needed for sleep.  30 tablet  0  . lisinopril (PRINIVIL,ZESTRIL) 10 MG tablet Take 1 tablet (10 mg total) by mouth daily.  30 tablet  3  . LORazepam (ATIVAN) 0.5 MG tablet Take 1 tablet (0.5 mg total) by mouth every 6 (six) hours as needed (Nausea or vomiting).  30 tablet  0  . Rivaroxaban (XARELTO) 20 MG TABS tablet Take 1 tablet (20 mg total) by mouth daily with supper.  30 tablet  6  . furosemide (LASIX) 20 MG tablet Take 40 mg by mouth daily as needed.      . lidocaine-prilocaine (EMLA) cream Apply topically  as needed.  30 g  0  . omeprazole (PRILOSEC) 20 MG capsule TAKE ONE CAPSULE BY MOUTH EVERY DAY  30 capsule  2  . potassium chloride SA (K-DUR,KLOR-CON) 20 MEQ tablet Take 20 mEq by mouth daily as needed.       No current facility-administered medications for this visit.     Allergies:  Allergies  Allergen Reactions  . Amoxicillin Other (See Comments)    States "it does not workEnvironmental manager History: Past Medical History  Diagnosis Date  . Urinary incontinence     occasional  . Family history of anesthesia complication     pt's mother has hx. of post-op N/V  . Hypertension     under control with med., has been on med. since 09/2012  . Anxiety   . Breast cancer 10/16/12    left, lower medial, 8 o'clock    Surgical History:  Past Surgical History  Procedure Laterality Date  . Cesarean section  1999  . Breast lumpectomy with needle localization and axillary sentinel lymph node bx Left 12/10/2012    Procedure: BREAST LUMPECTOMY WITH NEEDLE LOCALIZATION AND AXILLARY SENTINEL LYMPH NODE BX;  Surgeon: Merrie Roof, MD;  Location: [facility];  Service: General;  Laterality: Left;  Needle loc BCG 7:30  nuc med 9:30    . Tonsillectomy and adenoidectomy      as a teenager  . Cholecystectomy  1999  . Open reduction internal fixation (orif) tibia/fibula fracture Right   . Portacath placement Right 01/09/2013    Procedure: INSERTION PORT-A-CATH;  Surgeon: Merrie Roof, MD;  Location: [facility];  Service: General;  Laterality: Right;     Review of Systems: A 10 point review of systems was conducted and is otherwise negative except for what is noted above.     Physical Exam: Blood pressure 128/87, pulse 99, temperature 98.3 F (36.8 C), temperature source Oral, resp. rate 18, height _0  (1.753 m), weight 243 lb 12.8 oz (110.587 kg). GENERAL: Patient is a well appearing female in no acute distress HEENT:  Sclerae anicteric.  Oropharynx clear and moist. No  ulcerations or evidence of oropharyngeal candidiasis. Neck is supple.  NODES:  No cervical, supraclavicular, or axillary lymphadenopathy palpated.  BREAST EXAM: No new changes noted  LUNGS:  Clear to auscultation bilaterally.  No wheezes or rhonchi. HEART:  Regular rhythm, tachycardic No murmur appreciated. ABDOMEN:  Soft, nontender.  Positive, normoactive bowel sounds. No organomegaly palpated. MSK:  No focal spinal tenderness to palpation. Full range of motion bilaterally in the upper extremities. EXTREMITIES:  No peripheral edema.   SKIN:  Clear with no obvious rashes or skin changes. No nail dyscrasia. NEURO:  Nonfocal. Well oriented.  Appropriate affect. ECOG PERFORMANCE STATUS: 1 - Symptomatic but completely ambulatory   Lab Results: Lab Results  Component Value Date   WBC 7.1 07/13/2013   HGB 12.2 07/13/2013   HCT 36.5 07/13/2013   MCV 95.3 07/13/2013   PLT 276 07/13/2013     Chemistry      Component Value Date/Time   NA 139 07/13/2013 1430   NA 136 01/23/2013 1413   K  3.7 07/13/2013 1430   K 3.7 01/23/2013 1413   CL 102 01/23/2013 1413   CO2 25 07/13/2013 1430   CO2 27 01/23/2013 1413   BUN 7.1 07/13/2013 1430   BUN 12 01/23/2013 1413   CREATININE 0.8 07/13/2013 1430   CREATININE 0.68 01/23/2013 1413      Component Value Date/Time   CALCIUM 9.5 07/13/2013 1430   CALCIUM 9.3 01/23/2013 1413   ALKPHOS 67 07/13/2013 1430   ALKPHOS 74 01/23/2013 1413   AST 53* 07/13/2013 1430   AST 27 01/23/2013 1413   ALT 63* 07/13/2013 1430   ALT 30 01/23/2013 1413   BILITOT 0.70 07/13/2013 1430   BILITOT 0.5 01/23/2013 1413       Assessment and Plan: [name] 38 y.o. female with   1.  Pathologic stage IIB invasive ductal carcinoma on the right breast.  The patient has underwent lumpectomy followed by adjuvant FEC x 6 cycles, and adjuvant Taxol x 7 cycles discontinued early due to hand foot syndrome.   She was started on radiation therapy. Today is day #6 tolerating quite well. She'll be  started on Aromasin therapy after completion of radiation therapy.  2.  Right lower extremity DVT.  Patient has been on Xarelto since December, 2014.  Repeat venous Doppler of the lower extremity was negative for any thrombosis.  Continue anticoagulant therapy towards the end of June, 2015.    3.  Grade III skin toxicity from Taxol.  Patient had severe hand foot syndrome from Taxol.  It is resolved now  4.  Elevated liver enzymes: Her liver functions are stable. Bilirubin was normalized. We'll repeat the liver functions in one month if those continue to raise, will obtain the CT of the abdomen. she stopped taking Vicodin.    5.  Tachycardia:  She was evaluated by cardiology and was told it could be related to the Zoladex injection. We'll closely monitor her condition.   6.  Ovarian suppression.  The patient receives Zoladex every 4 weeks to suppress her ovaries as she will have to take Aromasin following radiation therapy due to the fact that she is no longer a candidate for Tamoxifen secondary to a DVT.  She was referred to gynecology for eval for TAH/BSO at the patient's request.    7. Neuropathy.  The patient was recommended to take Gabapentin . She says that it's causing more sleepy and she stopped taking gabapentin. Her neuropathy slightly improved  8. chemotherapy port flush today and once every 4-6 weeks  She will return in 4 weeks for labs, evaluation and her next Zoladex injection.  She knows to call us in the interim for any questions, concerns, and we can certainly see her sooner if needed.    I spent 20 minutes counseling the patient face to face.  The total time spent in the appointment was 29mnutes.  PWilmon Arms M.D. [phone]/27/2015 4:03 PM

## 2013-07-14 ENCOUNTER — Ambulatory Visit (HOSPITAL_BASED_OUTPATIENT_CLINIC_OR_DEPARTMENT_OTHER): Payer: BC Managed Care – PPO

## 2013-07-14 ENCOUNTER — Ambulatory Visit
Admission: RE | Admit: 2013-07-14 | Discharge: 2013-07-14 | Disposition: A | Discharge status: Home / Self Care | Payer: BC Managed Care – PPO | Source: Ambulatory Visit | Attending: Radiation Oncology | Admitting: Radiation Oncology

## 2013-07-14 VITALS — BP 132/74 | HR 94

## 2013-07-14 DIAGNOSIS — C50319 Malignant neoplasm of lower-inner quadrant of unspecified female breast: Secondary | ICD-10-CM

## 2013-07-14 DIAGNOSIS — Z452 Encounter for adjustment and management of vascular access device: Secondary | ICD-10-CM

## 2013-07-14 DIAGNOSIS — Z95828 Presence of other vascular implants and grafts: Secondary | ICD-10-CM

## 2013-07-14 MED ORDER — HEPARIN SOD (PORK) LOCK FLUSH 100 UNIT/ML IV SOLN
500.0000 [IU] | Freq: Once | INTRAVENOUS | Status: AC
  Administered 2013-07-14: 500 [IU] via INTRAVENOUS
  Filled 2013-07-14: qty 5

## 2013-07-14 MED ORDER — SODIUM CHLORIDE 0.9 % IJ SOLN
10.0000 mL | INTRAMUSCULAR | Status: DC | PRN
  Administered 2013-07-14: 10 mL via INTRAVENOUS
  Filled 2013-07-14: qty 10

## 2013-07-15 ENCOUNTER — Ambulatory Visit
Admission: RE | Admit: 2013-07-15 | Discharge: 2013-07-15 | Disposition: A | Discharge status: Home / Self Care | Payer: BC Managed Care – PPO | Source: Ambulatory Visit | Attending: Radiation Oncology | Admitting: Radiation Oncology

## 2013-07-16 ENCOUNTER — Other Ambulatory Visit: Payer: Self-pay

## 2013-07-16 ENCOUNTER — Ambulatory Visit
Admission: RE | Admit: 2013-07-16 | Discharge: 2013-07-16 | Disposition: A | Discharge status: Home / Self Care | Payer: BC Managed Care – PPO | Source: Ambulatory Visit | Attending: Radiation Oncology | Admitting: Radiation Oncology

## 2013-07-16 ENCOUNTER — Ambulatory Visit (HOSPITAL_BASED_OUTPATIENT_CLINIC_OR_DEPARTMENT_OTHER): Payer: BC Managed Care – PPO

## 2013-07-16 ENCOUNTER — Ambulatory Visit (HOSPITAL_BASED_OUTPATIENT_CLINIC_OR_DEPARTMENT_OTHER): Payer: BC Managed Care – PPO | Admitting: Adult Health

## 2013-07-16 DIAGNOSIS — C50319 Malignant neoplasm of lower-inner quadrant of unspecified female breast: Secondary | ICD-10-CM

## 2013-07-16 DIAGNOSIS — Z170 Estrogen receptor positive status [ER+]: Secondary | ICD-10-CM

## 2013-07-16 DIAGNOSIS — R000 Tachycardia, unspecified: Secondary | ICD-10-CM

## 2013-07-16 DIAGNOSIS — R509 Fever, unspecified: Secondary | ICD-10-CM

## 2013-07-16 LAB — CBC WITH DIFFERENTIAL/PLATELET
BASO%: 0.5 % (ref 0.0–2.0)
Basophils Absolute: 0 10*3/uL (ref 0.0–0.1)
EOS%: 1 % (ref 0.0–7.0)
Eosinophils Absolute: 0.1 10*3/uL (ref 0.0–0.5)
HCT: 33.2 % — ABNORMAL LOW (ref 34.8–46.6)
HGB: 11.2 g/dL — ABNORMAL LOW (ref 11.6–15.9)
LYMPH%: 10.9 % — AB (ref 14.0–49.7)
MCH: 31.8 pg (ref 25.1–34.0)
MCHC: 33.8 g/dL (ref 31.5–36.0)
MCV: 94.2 fL (ref 79.5–101.0)
MONO#: 0.5 10*3/uL (ref 0.1–0.9)
MONO%: 8.9 % (ref 0.0–14.0)
NEUT#: 4.5 10*3/uL (ref 1.5–6.5)
NEUT%: 78.7 % — ABNORMAL HIGH (ref 38.4–76.8)
Platelets: 198 10*3/uL (ref 145–400)
RBC: 3.52 10*6/uL — AB (ref 3.70–5.45)
RDW: 16.1 % — ABNORMAL HIGH (ref 11.2–14.5)
WBC: 5.7 10*3/uL (ref 3.9–10.3)
lymph#: 0.6 10*3/uL — ABNORMAL LOW (ref 0.9–3.3)

## 2013-07-16 LAB — URINALYSIS, MICROSCOPIC - CHCC
Bilirubin (Urine): NEGATIVE
Glucose: NEGATIVE mg/dL
Ketones: NEGATIVE mg/dL
LEUKOCYTE ESTERASE: NEGATIVE
Nitrite: NEGATIVE
PROTEIN: NEGATIVE mg/dL
Specific Gravity, Urine: 1.01 (ref 1.003–1.035)
UROBILINOGEN UR: 0.2 mg/dL (ref 0.2–1)
pH: 6.5 (ref 4.6–8.0)

## 2013-07-16 LAB — INFLUENZA A AND B
INFLUENZA A AG: NEGATIVE
INFLUENZA B AG: NEGATIVE

## 2013-07-16 MED ORDER — ACETAMINOPHEN 325 MG PO TABS
650.0000 mg | ORAL_TABLET | Freq: Once | ORAL | Status: AC
  Administered 2013-07-16: 650 mg via ORAL

## 2013-07-16 MED ORDER — OSELTAMIVIR PHOSPHATE 75 MG PO CAPS: 75.0000 mg | ORAL_CAPSULE | Freq: Two times a day (BID) | ORAL | Status: DC

## 2013-07-16 MED ORDER — ACETAMINOPHEN 325 MG PO TABS
ORAL_TABLET | ORAL | Status: AC
  Filled 2013-07-16: qty 2

## 2013-07-16 NOTE — Progress Notes (Signed)
Hematology and Oncology Follow Up Visit  [name] [id number] June 15, 1975 38 y.o. 07/19/2013 1:17 PM     Principle Diagnosis:[name] 38 y.o. 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     Prior Therapy: Per previously dictated note:   #1 A screening mammogram in [place], [place] on 09/23/2012 she was felt to have a new mass within the left breast. An ultrasound on 10/06/2012 showed a 1.6 x 1.3 x 1.3 cm mass at the 8 o'clock position 7 cm from the nipple. At the 12 o'clock position behind and nipple was a circumscribed oval shaped 2 x 1.8 x 1.2 cm lesion perhaps representing a fibroadenoma. 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 The carcinoma was grade 2-3, estrogen receptor 80% positive, progesterone receptor 80% positive, Ki-67 25%, and HER-2/neu negative. Bilateral breast MRI on 10/22/2012 showed biopsy proven carcinoma in the left lower inner quadrant identified and measures approximately 1.9 x 1.9 x 1.8 cm. Well-defined nodule in the right upper outer quadrant posteriorly consistent with a fibroadenoma. Probable liver cysts.There were multiple scattered nonspecific foci in each breast. No other suspicious abnormality was identified. No abnormal appearing lymph nodes (Clinical stage I, T1 N0).   #2 Status post left breast lumpectomy with left axillary sentinel lymph node biopsy on 12/10/2012 for a stage IIB, pT2, pN26m, MX, 2.3 cm invasive ductal carcinoma, grade 3, estrogen receptor 80% positive, progesterone receptor 80% positive, Ki-67 25%, HER-2/neu negative, with 1/2 left axillary sentinel lymph nodes positive for microscopic disease.   #3 [place] BRCA1 and BRCA2 negative.   #4 s/p Adjuvant chemotherapy consisting of  FEC (5-FU/epirubicin/Cytoxan) began on 01/23/2013. She completed 6 cycles of FEC on 04/03/13.   #5. S/p adjuvant weekly Taxol beginning 04/17/13 - 06/05/13 x 7 of the 12 planned cycles. Chemotherapy stopped due to grade III skin toxicity.   #6 Right lower extremity DVT diagnosed on 03/16/2013, patient was placed on Xarelto 162mBID. Her dose was changed to 2043maily on 04/10/13. Repeat venous Doppler of the lower extremity performed in March 2015 was negative for any thrombosis      Current therapy:  Radiation therapy  Interim History: [name] 17o. female with stage IIB breast cancer who is here for her radiation and is feeling very poorly.  She just completed her radiation therapy, and has chills, generalized aches and pains, along with a fever.  She is lying on the exam table during our discussion covered with a blanket.  She has mild non productive cough and the fever and chills began this morning.  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    Medications:  Current Outpatient Prescriptions  Medication Sig Dispense Refill  . ALPRAZolam (XANAX) 0.25 MG tablet Take 1 tablet (0.25 mg total) by mouth at bedtime as needed for sleep.  30 tablet  0  . furosemide (LASIX) 20 MG tablet Take 40 mg by mouth daily as needed.      . lidocaine-prilocaine (EMLA) cream Apply topically as needed.  30 g  0  . lisinopril (PRINIVIL,ZESTRIL) 10 MG tablet Take 1 tablet (10 mg total) by mouth daily.  30 tablet  3  . LORazepam (ATIVAN) 0.5 MG tablet  Take 1 tablet (0.5 mg total) by mouth every 6 (six) hours as needed (Nausea or vomiting).  30 tablet  0  . omeprazole (PRILOSEC) 20 MG capsule TAKE ONE CAPSULE BY MOUTH EVERY DAY  30 capsule  2  . oseltamivir (TAMIFLU) 75 MG capsule Take 1 capsule (75 mg total) by mouth 2 (two) times daily.  10 capsule  0  . potassium chloride SA (K-DUR,KLOR-CON) 20 MEQ tablet  Take 20 mEq by mouth daily as needed.      . Rivaroxaban (XARELTO) 20 MG TABS tablet Take 1 tablet (20 mg total) by mouth daily with supper.  30 tablet  6   No current facility-administered medications for this visit.     Allergies:  Allergies  Allergen Reactions  . Amoxicillin Other (See Comments)    States "it does not workEnvironmental manager History: Past Medical History  Diagnosis Date  . Urinary incontinence     occasional  . Family history of anesthesia complication     pt's mother has hx. of post-op N/V  . Hypertension     under control with med., has been on med. since 09/2012  . Anxiety   . Breast cancer 10/16/12    left, lower medial, 8 o'clock    Surgical History:  Past Surgical History  Procedure Laterality Date  . Cesarean section  1999  . Breast lumpectomy with needle localization and axillary sentinel lymph node bx Left 12/10/2012    Procedure: BREAST LUMPECTOMY WITH NEEDLE LOCALIZATION AND AXILLARY SENTINEL LYMPH NODE BX;  Surgeon: Merrie Roof, MD;  Location: [facility];  Service: General;  Laterality: Left;  Needle loc BCG 7:30  nuc med 9:30    . Tonsillectomy and adenoidectomy      as a teenager  . Cholecystectomy  1999  . Open reduction internal fixation (orif) tibia/fibula fracture Right   . Portacath placement Right 01/09/2013    Procedure: INSERTION PORT-A-CATH;  Surgeon: Merrie Roof, MD;  Location: [facility];  Service: General;  Laterality: Right;     Review of Systems: A 10 point review of systems was conducted and is otherwise negative except for what is noted above.    Physical Exam: There were no vitals taken for this visit. See CHL.  I personally checked her temperature and it was 100.9. GENERAL: Patient is an acutely ill appearing female, she is in no acute distress, but is lying on exam table covered in blankets and rigoring HEENT:  Sclerae anicteric.  Oropharynx clear and moist. No ulcerations or evidence of oropharyngeal  candidiasis. Neck is supple.  NODES:  No cervical, supraclavicular, or axillary lymphadenopathy palpated.  BREAST EXAM:  Deferred. LUNGS:  Clear to auscultation bilaterally.  No wheezes or rhonchi. HEART:  Regular rhythm, slightly tachycardic at 120, regular. No murmur appreciated. ABDOMEN:  Soft, nontender.  Positive, normoactive bowel sounds. No organomegaly palpated. MSK:  No focal spinal tenderness to palpation. Full range of motion bilaterally in the upper extremities. EXTREMITIES:  No peripheral edema.   SKIN:  Clear with no obvious rashes or skin changes. No nail dyscrasia. NEURO:  Nonfocal. Well oriented.  Appropriate affect. ECOG PERFORMANCE STATUS: 1 - Symptomatic but completely ambulatory   Lab Results: Lab Results  Component Value Date   WBC 5.7 07/16/2013   HGB 11.2* 07/16/2013   HCT 33.2* 07/16/2013   MCV 94.2 07/16/2013   PLT 198 07/16/2013     Chemistry      Component Value Date/Time  NA 139 07/13/2013 1430   NA 136 01/23/2013 1413   K 3.7 07/13/2013 1430   K 3.7 01/23/2013 1413   CL 102 01/23/2013 1413   CO2 25 07/13/2013 1430   CO2 27 01/23/2013 1413   BUN 7.1 07/13/2013 1430   BUN 12 01/23/2013 1413   CREATININE 0.8 07/13/2013 1430   CREATININE 0.68 01/23/2013 1413      Component Value Date/Time   CALCIUM 9.5 07/13/2013 1430   CALCIUM 9.3 01/23/2013 1413   ALKPHOS 67 07/13/2013 1430   ALKPHOS 74 01/23/2013 1413   AST 53* 07/13/2013 1430   AST 27 01/23/2013 1413   ALT 63* 07/13/2013 1430   ALT 30 01/23/2013 1413   BILITOT 0.70 07/13/2013 1430   BILITOT 0.5 01/23/2013 1413    Assessment and Plan: [name] 38 y.o. female with  1. Stage IIB ER/PR positive, HER-2/neu negative invasive ductal carcinoma of the left breast.  She is undergoing radiation therapy daily.  See previous history above.    2.  Fever, chills, and rigors:  I did a flu swab and prescribed Tamiflu.  She has already had her cipro refilled and will start taking.  I ordered peripheral and central blood  cultures, along with a urinalysis and urine culture.  I ordered Tylenol to be administered by the RN before she leaves.  She is hemodynamically stable.  I encouraged her to drink increased amounts of fluids.    The patient will knows to call us if she does not improve.  I will see her back on 08/12/13 as scheduled for her routine f/u.   She knows to call us in the interim for any questions or concerns.  We can certainly see her sooner if needed.  I spent 25 minutes counseling the patient face to face.  The total time spent in the appointment was 30 minutes.  Minette Headland, [place] [phone] 07/19/2013 1:17 PM

## 2013-07-17 ENCOUNTER — Ambulatory Visit
Admission: RE | Admit: 2013-07-17 | Discharge: 2013-07-17 | Disposition: A | Discharge status: Home / Self Care | Payer: BC Managed Care – PPO | Source: Ambulatory Visit | Attending: Radiation Oncology | Admitting: Radiation Oncology

## 2013-07-17 ENCOUNTER — Telehealth: Payer: Self-pay | Admitting: *Deleted

## 2013-07-17 ENCOUNTER — Encounter (INDEPENDENT_AMBULATORY_CARE_PROVIDER_SITE_OTHER): Payer: Self-pay | Admitting: General Surgery

## 2013-07-17 LAB — URINE CULTURE

## 2013-07-17 NOTE — Telephone Encounter (Signed)
Called pt to follow up from yesterday's visit. Informed pt that Flu swab was negative. Pt had no fever this am, states she feels a little better. Advised pt to continue to treat fever with Tylenol, drink plenty of fluids and told her she can take the Tamiflu if she would like per Charlestine Massed, NP. Told pt to call us @ [phone]  if she has any further problems. Message to be forwarded to Charlestine Massed, NP.

## 2013-07-19 ENCOUNTER — Encounter: Payer: Self-pay | Admitting: Adult Health

## 2013-07-20 ENCOUNTER — Telehealth: Payer: Self-pay | Admitting: *Deleted

## 2013-07-20 ENCOUNTER — Ambulatory Visit
Admission: RE | Admit: 2013-07-20 | Discharge: 2013-07-20 | Disposition: A | Discharge status: Home / Self Care | Payer: BC Managed Care – PPO | Source: Ambulatory Visit | Attending: Radiation Oncology | Admitting: Radiation Oncology

## 2013-07-20 ENCOUNTER — Encounter: Payer: Self-pay | Admitting: Radiation Oncology

## 2013-07-20 VITALS — BP 124/90 | HR 98 | Temp 98.4°F | Resp 20 | Wt 240.7 lb

## 2013-07-20 DIAGNOSIS — C50319 Malignant neoplasm of lower-inner quadrant of unspecified female breast: Secondary | ICD-10-CM

## 2013-07-20 NOTE — Telephone Encounter (Signed)
Called to follow up on pt's condition from last week's visit in office. Pt presented with flu - like symptoms, but flu swab was neg. Pt stated," I had no fever at all this weekend, no body aches. I feel so much better now". Pt thanked [place] for the call. Message to be forwarded to Charlestine Massed, NP.

## 2013-07-20 NOTE — Progress Notes (Signed)
Pt denies pain, fatigue, loss of appetite. She is applying Radiaplex to left breast treatment area. She states she tested negative for flu last Thurs, was put on Cipro to take x 7 days. She states she "feels much better".

## 2013-07-20 NOTE — Progress Notes (Signed)
Weekly Management Note:  Site: Left breast and regional lymph nodes Current Dose:  1980  cGy Projected Dose: 8127  NTZ followed by 7 fraction boost  Narrative: The patient is seen today for routine under treatment assessment. CBCT/MVCT images/port films were reviewed. The chart was reviewed.   She is on Cipro for a URI. She tested negative for the flu. She went to work today. She is beginning to use Radioplex gel.  Physical Examination:  Filed Vitals:   07/20/13 1514  BP: 124/90  Pulse: 98  Temp: 98.4 F (36.9 C)  Resp: 20  .  Weight: 240 lb 11.2 oz (109.181 kg). There is mild erythema the skin along the left breast along with slight hyperpigmentation the skin with no areas of desquamation.  Impression: Tolerating radiation therapy well.  Plan: Continue radiation therapy as planned.

## 2013-07-21 ENCOUNTER — Ambulatory Visit
Admission: RE | Admit: 2013-07-21 | Discharge: 2013-07-21 | Disposition: A | Discharge status: Home / Self Care | Payer: BC Managed Care – PPO | Source: Ambulatory Visit | Attending: Radiation Oncology | Admitting: Radiation Oncology

## 2013-07-22 ENCOUNTER — Ambulatory Visit
Admission: RE | Admit: 2013-07-22 | Discharge: 2013-07-22 | Disposition: A | Discharge status: Home / Self Care | Payer: BC Managed Care – PPO | Source: Ambulatory Visit | Attending: Radiation Oncology | Admitting: Radiation Oncology

## 2013-07-22 LAB — CULTURE, BLOOD (SINGLE)

## 2013-07-23 ENCOUNTER — Ambulatory Visit
Admission: RE | Admit: 2013-07-23 | Discharge: 2013-07-23 | Disposition: A | Discharge status: Home / Self Care | Payer: BC Managed Care – PPO | Source: Ambulatory Visit | Attending: Radiation Oncology | Admitting: Radiation Oncology

## 2013-07-24 ENCOUNTER — Ambulatory Visit
Admission: RE | Admit: 2013-07-24 | Discharge: 2013-07-24 | Disposition: A | Discharge status: Home / Self Care | Payer: BC Managed Care – PPO | Source: Ambulatory Visit | Attending: Radiation Oncology | Admitting: Radiation Oncology

## 2013-07-27 ENCOUNTER — Ambulatory Visit
Admission: RE | Admit: 2013-07-27 | Discharge: 2013-07-27 | Disposition: A | Discharge status: Home / Self Care | Payer: BC Managed Care – PPO | Source: Ambulatory Visit | Attending: Radiation Oncology | Admitting: Radiation Oncology

## 2013-07-27 ENCOUNTER — Encounter: Payer: Self-pay | Admitting: Radiation Oncology

## 2013-07-27 VITALS — BP 135/85 | HR 94 | Resp 16 | Wt 244.6 lb

## 2013-07-27 DIAGNOSIS — C50319 Malignant neoplasm of lower-inner quadrant of unspecified female breast: Secondary | ICD-10-CM

## 2013-07-27 MED ORDER — RADIAPLEXRX EX GEL
Freq: Once | CUTANEOUS | Status: AC
Start: 2013-07-27 — End: 2013-07-27
  Administered 2013-07-27: 17:00:00 via TOPICAL

## 2013-07-27 NOTE — Progress Notes (Signed)
Weekly Management Note:  Site: Left breast/regional lymph nodes Current Dose:  2880  cGy Projected Dose: 4680  cGy followed by left breast boost  Narrative: The patient is seen today for routine under treatment assessment. CBCT/MVCT images/port films were reviewed. The chart was reviewed.   She uses Radioplex gel. She does have mild fatigue.  Physical Examination:  Filed Vitals:   07/27/13 1624  BP: 135/85  Pulse: 94  Resp: 16  .  Weight: 244 lb 9.6 oz (110.95 kg). There is slight hyperpigmentation and erythema along the left breast and clavicular region with no areas of desquamation.  Impression: Tolerating radiation therapy well.  Plan: Continue radiation therapy as planned.

## 2013-07-27 NOTE — Progress Notes (Signed)
There is mild erythema the skin along the left breast along with slight hyperpigmentation of treated skin without desquamation. Reports using radiaplex bid as directed. Reports mild fatigue but, denies it is as bad as it was while receiving chemotherapy treatments. Patient looking forward to going to the beach at completion of treatment. Provided patient with another tube of radiaplex gel. Reports she has started going to the gym three times a week.

## 2013-07-28 ENCOUNTER — Ambulatory Visit
Admission: RE | Admit: 2013-07-28 | Discharge: 2013-07-28 | Disposition: A | Discharge status: Home / Self Care | Payer: BC Managed Care – PPO | Source: Ambulatory Visit | Attending: Radiation Oncology | Admitting: Radiation Oncology

## 2013-07-29 ENCOUNTER — Ambulatory Visit
Admission: RE | Admit: 2013-07-29 | Discharge: 2013-07-29 | Disposition: A | Discharge status: Home / Self Care | Payer: BC Managed Care – PPO | Source: Ambulatory Visit | Attending: Radiation Oncology | Admitting: Radiation Oncology

## 2013-07-30 ENCOUNTER — Telehealth: Payer: Self-pay | Admitting: Adult Health

## 2013-07-30 ENCOUNTER — Ambulatory Visit
Admission: RE | Admit: 2013-07-30 | Discharge: 2013-07-30 | Disposition: A | Discharge status: Home / Self Care | Payer: BC Managed Care – PPO | Source: Ambulatory Visit | Attending: Radiation Oncology | Admitting: Radiation Oncology

## 2013-07-30 NOTE — Telephone Encounter (Signed)
per LC to resch appt-resch w/pt and gave pt sch of new time & date of app

## 2013-07-31 ENCOUNTER — Ambulatory Visit
Admission: RE | Admit: 2013-07-31 | Discharge: 2013-07-31 | Disposition: A | Discharge status: Home / Self Care | Payer: BC Managed Care – PPO | Source: Ambulatory Visit | Attending: Radiation Oncology | Admitting: Radiation Oncology

## 2013-08-03 ENCOUNTER — Ambulatory Visit
Admission: RE | Admit: 2013-08-03 | Discharge: 2013-08-03 | Disposition: A | Discharge status: Home / Self Care | Payer: BC Managed Care – PPO | Source: Ambulatory Visit | Attending: Radiation Oncology | Admitting: Radiation Oncology

## 2013-08-03 VITALS — BP 139/98 | HR 92 | Temp 98.9°F | Wt 241.6 lb

## 2013-08-03 DIAGNOSIS — C50319 Malignant neoplasm of lower-inner quadrant of unspecified female breast: Secondary | ICD-10-CM

## 2013-08-03 NOTE — Progress Notes (Signed)
Weekly Management Note:  Site: Left breast/regional lymph nodes Current Dose:  3780  cGy Projected Dose: 4680  cGy followed by 7 fraction left breast boost  Narrative: The patient is seen today for routine under treatment assessment. CBCT/MVCT images/port films were reviewed. The chart was reviewed.   She reports discomfort along her left axilla where she has a focal area of moist desquamation. She uses Radioplex gel.  Physical Examination:  Filed Vitals:   08/03/13 1632  BP: 139/98  Pulse: 92  Temp: 98.9 F (37.2 C)  .  Weight: 241 lb 9.6 oz (109.589 kg). There is hyperpigmentation and erythema along left breast and axilla. There is a 2 cm area of moist desquamation along the left axilla.  Impression: Tolerating radiation therapy well, although she does have focal moist desquamation along her axillary skin fold. I gave her Aquaphor to use when necessary.  Plan: Continue radiation therapy as planned.

## 2013-08-03 NOTE — Progress Notes (Signed)
Weekly assessment of radiation to left [email or web address] 21 of 26 treatments before starting boost of 7 treatments.Skin is red with some peeling of left axilla.Informed patient to apply neosporin to this area 2 to 3 times daily.Continue with radiaplex.Mild fatigue.

## 2013-08-04 ENCOUNTER — Encounter: Payer: Self-pay | Admitting: Radiation Oncology

## 2013-08-04 ENCOUNTER — Ambulatory Visit
Admission: RE | Admit: 2013-08-04 | Discharge: 2013-08-04 | Disposition: A | Discharge status: Home / Self Care | Payer: BC Managed Care – PPO | Source: Ambulatory Visit | Attending: Radiation Oncology | Admitting: Radiation Oncology

## 2013-08-04 NOTE — Progress Notes (Signed)
Complex simulation note: The patient underwent virtual simulation for her left breast boost. We looked at electron beam boost with either 15 or 18 MEV electrons and also a photon boost. We determined that he three-field photon boost with spare the heart better than electrons. Therefore, she was set up to 3 field technique with 6 and 15 MV photons. 3 unique multileaf collimators were designed to conform the field. I prescribing 1400 cGy in 7 sessions. An isodose plan was requested and reviewed. She is to be treated with deep inspiration and breath-hold, and daily alignRt  will continue.

## 2013-08-05 ENCOUNTER — Ambulatory Visit
Admission: RE | Admit: 2013-08-05 | Discharge: 2013-08-05 | Disposition: A | Discharge status: Home / Self Care | Payer: BC Managed Care – PPO | Source: Ambulatory Visit | Attending: Radiation Oncology | Admitting: Radiation Oncology

## 2013-08-06 ENCOUNTER — Ambulatory Visit
Admission: RE | Admit: 2013-08-06 | Discharge: 2013-08-06 | Disposition: A | Discharge status: Home / Self Care | Payer: BC Managed Care – PPO | Source: Ambulatory Visit | Attending: Radiation Oncology | Admitting: Radiation Oncology

## 2013-08-07 ENCOUNTER — Ambulatory Visit
Admission: RE | Admit: 2013-08-07 | Discharge: 2013-08-07 | Disposition: A | Discharge status: Home / Self Care | Payer: BC Managed Care – PPO | Source: Ambulatory Visit | Attending: Radiation Oncology | Admitting: Radiation Oncology

## 2013-08-11 ENCOUNTER — Encounter: Payer: Self-pay | Admitting: Oncology

## 2013-08-11 ENCOUNTER — Ambulatory Visit
Admission: RE | Admit: 2013-08-11 | Discharge: 2013-08-11 | Disposition: A | Discharge status: Home / Self Care | Payer: BC Managed Care – PPO | Source: Ambulatory Visit | Attending: Radiation Oncology | Admitting: Radiation Oncology

## 2013-08-11 VITALS — BP 138/84 | HR 90 | Temp 98.3°F | Wt 241.6 lb

## 2013-08-11 DIAGNOSIS — C50319 Malignant neoplasm of lower-inner quadrant of unspecified female breast: Secondary | ICD-10-CM

## 2013-08-11 NOTE — Progress Notes (Signed)
Will see if asst available for Zoladex- will need income info from patient per PAN

## 2013-08-11 NOTE — Progress Notes (Signed)
Weekly Management Note:  Site:Left  Breast/regional lymph nodes Current Dose:  4680  cGy Projected Dose: 7841  QKS followed by 7 fraction boost  Narrative: The patient is seen today for routine under treatment assessment. CBCT/MVCT images/port films were reviewed. The chart was reviewed.   She is without new complaints today except for more discomfort along her left axilla, supraclavicular region, and now inframammary region where she has areas of moist desquamation. She went to [place] over the weekend.  Physical Examination:  Filed Vitals:   08/11/13 1646  BP: 138/84  Pulse: 90  Temp: 98.3 F (36.8 C)  .  Weight: 241 lb 9.6 oz (109.589 kg). There are focal areas of moist desquamation along the supraclavicular region, axilla, and inframammary region. There is erythema elsewhere with patchy dry desquamation.  Impression: Tolerating radiation therapy well, except for areas of moist desquamation. She we'll use triple antibiotic met ointment to areas of moist desquamation. I believe that her areas of desquamation will improve once she starts her left breast boost tomorrow.  Plan: Continue radiation therapy as planned.

## 2013-08-11 NOTE — Progress Notes (Signed)
Weekly assessment of radiation to left breast.Has peeling of left axilla and mammary fold.Using neosporin pain medication as well as aquaphor.Needs new tube of radiaplex.also gave telfa pads to prevent sticking to  skin and clothing.Starts boost tomorrow.

## 2013-08-12 ENCOUNTER — Encounter: Payer: Self-pay | Admitting: Radiation Oncology

## 2013-08-12 ENCOUNTER — Ambulatory Visit
Admission: RE | Admit: 2013-08-12 | Discharge: 2013-08-12 | Disposition: A | Discharge status: Home / Self Care | Payer: BC Managed Care – PPO | Source: Ambulatory Visit | Attending: Radiation Oncology | Admitting: Radiation Oncology

## 2013-08-12 ENCOUNTER — Other Ambulatory Visit: Payer: BC Managed Care – PPO

## 2013-08-12 ENCOUNTER — Ambulatory Visit: Payer: BC Managed Care – PPO | Admitting: Adult Health

## 2013-08-12 ENCOUNTER — Encounter: Payer: Self-pay | Admitting: Adult Health

## 2013-08-12 NOTE — Progress Notes (Signed)
Simulation verification note: The patient underwent simulation verification for treatment to her left breast (boost). Her isocenter is in good position and the multileaf collimators contoured the treatment volume appropriately.

## 2013-08-12 NOTE — Progress Notes (Signed)
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 If it was there would possible funding to asst.

## 2013-08-13 ENCOUNTER — Ambulatory Visit
Admission: RE | Admit: 2013-08-13 | Discharge: 2013-08-13 | Disposition: A | Discharge status: Home / Self Care | Payer: BC Managed Care – PPO | Source: Ambulatory Visit | Attending: Radiation Oncology | Admitting: Radiation Oncology

## 2013-08-13 DIAGNOSIS — C50319 Malignant neoplasm of lower-inner quadrant of unspecified female breast: Secondary | ICD-10-CM

## 2013-08-13 MED ORDER — RADIAPLEXRX EX GEL
Freq: Once | CUTANEOUS | Status: AC
  Administered 2013-08-13: 15:00:00 via TOPICAL

## 2013-08-14 ENCOUNTER — Encounter: Payer: Self-pay | Admitting: Adult Health

## 2013-08-14 ENCOUNTER — Ambulatory Visit
Admission: RE | Admit: 2013-08-14 | Discharge: 2013-08-14 | Disposition: A | Discharge status: Home / Self Care | Payer: BC Managed Care – PPO | Source: Ambulatory Visit | Attending: Radiation Oncology | Admitting: Radiation Oncology

## 2013-08-14 ENCOUNTER — Ambulatory Visit (HOSPITAL_BASED_OUTPATIENT_CLINIC_OR_DEPARTMENT_OTHER): Payer: BC Managed Care – PPO

## 2013-08-14 ENCOUNTER — Other Ambulatory Visit (HOSPITAL_BASED_OUTPATIENT_CLINIC_OR_DEPARTMENT_OTHER): Payer: BC Managed Care – PPO

## 2013-08-14 ENCOUNTER — Ambulatory Visit (HOSPITAL_BASED_OUTPATIENT_CLINIC_OR_DEPARTMENT_OTHER): Payer: BC Managed Care – PPO | Admitting: Adult Health

## 2013-08-14 VITALS — BP 142/90 | HR 84 | Temp 98.3°F | Resp 20 | Ht 69.0 in | Wt 238.5 lb

## 2013-08-14 VITALS — BP 145/98 | HR 86 | Temp 97.0°F

## 2013-08-14 DIAGNOSIS — Z5111 Encounter for antineoplastic chemotherapy: Secondary | ICD-10-CM

## 2013-08-14 DIAGNOSIS — C50319 Malignant neoplasm of lower-inner quadrant of unspecified female breast: Secondary | ICD-10-CM

## 2013-08-14 DIAGNOSIS — C50919 Malignant neoplasm of unspecified site of unspecified female breast: Secondary | ICD-10-CM

## 2013-08-14 DIAGNOSIS — Z170 Estrogen receptor positive status [ER+]: Secondary | ICD-10-CM

## 2013-08-14 DIAGNOSIS — Z95828 Presence of other vascular implants and grafts: Secondary | ICD-10-CM

## 2013-08-14 DIAGNOSIS — E2839 Other primary ovarian failure: Secondary | ICD-10-CM

## 2013-08-14 DIAGNOSIS — I10 Essential (primary) hypertension: Secondary | ICD-10-CM

## 2013-08-14 DIAGNOSIS — Z86718 Personal history of other venous thrombosis and embolism: Secondary | ICD-10-CM

## 2013-08-14 LAB — CBC WITH DIFFERENTIAL/PLATELET
BASO%: 1.2 % (ref 0.0–2.0)
Basophils Absolute: 0.1 10*3/uL (ref 0.0–0.1)
EOS ABS: 0.2 10*3/uL (ref 0.0–0.5)
EOS%: 3 % (ref 0.0–7.0)
HCT: 38.4 % (ref 34.8–46.6)
HGB: 13 g/dL (ref 11.6–15.9)
LYMPH#: 1 10*3/uL (ref 0.9–3.3)
LYMPH%: 18.6 % (ref 14.0–49.7)
MCH: 31.2 pg (ref 25.1–34.0)
MCHC: 33.8 g/dL (ref 31.5–36.0)
MCV: 92.3 fL (ref 79.5–101.0)
MONO#: 0.5 10*3/uL (ref 0.1–0.9)
MONO%: 8.9 % (ref 0.0–14.0)
NEUT%: 68.3 % (ref 38.4–76.8)
NEUTROS ABS: 3.9 10*3/uL (ref 1.5–6.5)
Platelets: 227 10*3/uL (ref 145–400)
RBC: 4.15 10*6/uL (ref 3.70–5.45)
RDW: 14.6 % — AB (ref 11.2–14.5)
WBC: 5.6 10*3/uL (ref 3.9–10.3)

## 2013-08-14 LAB — COMPREHENSIVE METABOLIC PANEL (CC13)
ALBUMIN: 3.6 g/dL (ref 3.5–5.0)
ALT: 41 U/L (ref 0–55)
AST: 37 U/L — ABNORMAL HIGH (ref 5–34)
Alkaline Phosphatase: 65 U/L (ref 40–150)
Anion Gap: 11 mEq/L (ref 3–11)
BUN: 8.8 mg/dL (ref 7.0–26.0)
CO2: 22 mEq/L (ref 22–29)
Calcium: 9.4 mg/dL (ref 8.4–10.4)
Chloride: 106 mEq/L (ref 98–109)
Creatinine: 0.7 mg/dL (ref 0.6–1.1)
GLUCOSE: 75 mg/dL (ref 70–140)
POTASSIUM: 4.2 meq/L (ref 3.5–5.1)
Sodium: 139 mEq/L (ref 136–145)
TOTAL PROTEIN: 6.2 g/dL — AB (ref 6.4–8.3)
Total Bilirubin: 1.14 mg/dL (ref 0.20–1.20)

## 2013-08-14 MED ORDER — ANASTROZOLE 1 MG PO TABS: 1.0000 mg | ORAL_TABLET | Freq: Every day | ORAL | Status: DC

## 2013-08-14 MED ORDER — LISINOPRIL 10 MG PO TABS: 10.0000 mg | ORAL_TABLET | Freq: Every day | ORAL | Status: DC

## 2013-08-14 MED ORDER — GOSERELIN ACETATE 3.6 MG ~~LOC~~ IMPL
3.6000 mg | DRUG_IMPLANT | Freq: Once | SUBCUTANEOUS | Status: AC
  Administered 2013-08-14: 3.6 mg via SUBCUTANEOUS
  Filled 2013-08-14: qty 3.6

## 2013-08-14 MED ORDER — HEPARIN SOD (PORK) LOCK FLUSH 100 UNIT/ML IV SOLN
500.0000 [IU] | Freq: Once | INTRAVENOUS | Status: AC
  Administered 2013-08-14: 500 [IU] via INTRAVENOUS
  Filled 2013-08-14: qty 5

## 2013-08-14 MED ORDER — SODIUM CHLORIDE 0.9 % IJ SOLN
10.0000 mL | INTRAMUSCULAR | Status: DC | PRN
  Administered 2013-08-14: 10 mL via INTRAVENOUS
  Filled 2013-08-14: qty 10

## 2013-08-14 NOTE — Progress Notes (Signed)
ID: [name] OB: 04/17/1975  MR#: [id number]  DGL#:[id number]  PCP: [name] GYN:   SU: Dr. Autumn Messing OTHER MD: Dr. Janey Greaser oncology  CHIEF COMPLAINT: 38 year old [name], [place] woman here for f/u of breast cancer.  BREAST CANCER HISTORY:  A screening mammogram in [place], [place] on 09/23/2012 she was felt to have a new mass within the left breast. An ultrasound on 10/06/2012 showed a 1.6 x 1.3 x 1.3 cm mass at the 8 o'clock position 7 cm from the nipple. At the 12 o'clock position behind and nipple was a circumscribed oval shaped 2 x 1.8 x 1.2 cm lesion perhaps representing a fibroadenoma. 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 The carcinoma was grade 2-3, estrogen receptor 80% positive, progesterone receptor 80% positive, Ki-67 25%, and HER-2/neu negative. Bilateral breast MRI on 10/22/2012 showed biopsy proven carcinoma in the left lower inner quadrant identified and measures approximately 1.9 x 1.9 x 1.8 cm. Well-defined nodule in the right upper outer quadrant posteriorly consistent with a fibroadenoma. Probable liver cysts.There were multiple scattered nonspecific foci in each breast. No other suspicious abnormality was identified. No abnormal appearing lymph nodes.    CURRENT THERAPY:  Radiation therapy  INTERVAL HISTORY: [name] is here for follow up today.  She is doing well she is undergoing radiation therapy and has continued on Zoladex every 4 weeks.  She has some redness on her breast and has tolerated radiation relatively well.  She wants to know when she can stop taking Xeralto.  She denies new pain, fevers, chills, dysuria, cough, shortness of breath, or any further concerns.    REVIEW OF SYSTEMS:A 10 point review of systems was conducted and is otherwise negative except for what is noted above.     PAST MEDICAL HISTORY: Past  Medical History  Diagnosis Date  . Urinary incontinence     occasional  . Family history of anesthesia complication     pt's mother has hx. of post-op N/V  . Hypertension     under control with med., has been on med. since 09/2012  . Anxiety   . Breast cancer 10/16/12    left, lower medial, 8 o'clock    PAST SURGICAL HISTORY: Past Surgical History  Procedure Laterality Date  . Cesarean section  1999  . Breast lumpectomy with needle localization and axillary sentinel lymph node bx Left 12/10/2012    Procedure: BREAST LUMPECTOMY WITH NEEDLE LOCALIZATION AND AXILLARY SENTINEL LYMPH NODE BX;  Surgeon: Merrie Roof, MD;  Location: [facility];  Service: General;  Laterality: Left;  Needle loc BCG 7:30  nuc med 9:30    . Tonsillectomy and adenoidectomy      as a teenager  . Cholecystectomy  1999  . Open reduction internal fixation (orif) tibia/fibula fracture Right   . Portacath placement Right 01/09/2013    Procedure: INSERTION PORT-A-CATH;  Surgeon: Merrie Roof, MD;  Location: [facility];  Service: General;  Laterality: Right;    FAMILY HISTORY Family History  Problem Relation Age of Onset  . Breast cancer Maternal Grandmother   . Lung cancer Paternal Grandmother   . Anesthesia problems Mother     post-op N/V    GYNECOLOGIC HISTORY: menarche at age 7, G74, P72, birth control x 17 years without difficulty, h/o occasional abnormal pap smears, has underwent colposcopy, no h/o sexually transmitted infections.  SOCIAL HISTORY:  Patient lives with her daughter in a house in [place], [place].  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  Current smoker x 20 years, about a pack a day, however has cut back tremendously.  No ETOH or illicit drug use.      ADVANCED DIRECTIVES: Not in place   HEALTH MAINTENANCE: History  Substance Use Topics  . Smoking status: Former Smoker    Quit date: 12/03/2012  . Smokeless tobacco: Never Used  . Alcohol  Use: Yes     Comment: occasionally      Mammogram: n/a Colonoscopy: n/a Bone Density Scan: n/a Pap Smear: 03/2013 Eye Exam: 08/2012 Vitamin D Level:  unsure Lipid Panel: unsure   Allergies  Allergen Reactions  . Amoxicillin Other (See Comments)    States "it does not work"    Current Outpatient Prescriptions  Medication Sig Dispense Refill  . Rivaroxaban (XARELTO) 20 MG TABS tablet Take 1 tablet (20 mg total) by mouth daily with supper.  30 tablet  6  . ALPRAZolam (XANAX) 0.25 MG tablet Take 1 tablet (0.25 mg total) by mouth at bedtime as needed for sleep.  30 tablet  0  . furosemide (LASIX) 20 MG tablet Take 40 mg by mouth daily as needed.      . lidocaine-prilocaine (EMLA) cream Apply topically as needed.  30 g  0  . lisinopril (PRINIVIL,ZESTRIL) 10 MG tablet Take 1 tablet (10 mg total) by mouth daily.  30 tablet  3  . LORazepam (ATIVAN) 0.5 MG tablet Take 1 tablet (0.5 mg total) by mouth every 6 (six) hours as needed (Nausea or vomiting).  30 tablet  0  . omeprazole (PRILOSEC) 20 MG capsule TAKE ONE CAPSULE BY MOUTH EVERY DAY  30 capsule  2  . potassium chloride SA (K-DUR,KLOR-CON) 20 MEQ tablet Take 20 mEq by mouth daily as needed.       No current facility-administered medications for this visit.    OBJECTIVE: Filed Vitals:   08/14/13 1301  BP: 142/90  Pulse: 84  Temp: 98.3 F (36.8 C)  Resp: 20     Body mass index is 35.2 kg/(m^2).     GENERAL: Patient is a well appearing female in no acute distress HEENT:  Sclerae anicteric.  Oropharynx clear and moist. No ulcerations or evidence of oropharyngeal candidiasis. Neck is supple.  NODES:  No cervical, supraclavicular, or axillary lymphadenopathy palpated.  BREAST EXAM:  Left breast s/p lumpectomy, erythema present, right breast no masses or nodules. Benign exam. LUNGS:  Clear to auscultation bilaterally.  No wheezes or rhonchi. HEART:  Regular rate and rhythm. No murmur appreciated. ABDOMEN:  Soft, nontender.   Positive, normoactive bowel sounds. No organomegaly palpated. MSK:  No focal spinal tenderness to palpation. Full range of motion bilaterally in the upper extremities. EXTREMITIES:  No peripheral edema.   SKIN:  Clear with no obvious rashes or skin changes. No nail dyscrasia. NEURO:  Nonfocal. Well oriented.  Appropriate affect. ECOG FS:1 - Symptomatic but completely ambulatory  LAB RESULTS:  CMP     Component Value Date/Time   NA 139 08/14/2013 1215   NA 136 01/23/2013 1413   K 4.2 08/14/2013 1215   K 3.7 01/23/2013 1413   CL 102 01/23/2013 1413   CO2 22 08/14/2013 1215   CO2 27 01/23/2013 1413   GLUCOSE 75 08/14/2013 1215   GLUCOSE 112* 01/23/2013 1413   BUN 8.8 08/14/2013 1215   BUN 12 01/23/2013 1413   CREATININE  0.7 08/14/2013 1215   CREATININE 0.68 01/23/2013 1413   CALCIUM 9.4 08/14/2013 1215   CALCIUM 9.3 01/23/2013 1413   PROT 6.2* 08/14/2013 1215   PROT 6.5 01/23/2013 1413   ALBUMIN 3.6 08/14/2013 1215   ALBUMIN 3.2* 01/23/2013 1413   AST 37* 08/14/2013 1215   AST 27 01/23/2013 1413   ALT 41 08/14/2013 1215   ALT 30 01/23/2013 1413   ALKPHOS 65 08/14/2013 1215   ALKPHOS 74 01/23/2013 1413   BILITOT 1.14 08/14/2013 1215   BILITOT 0.5 01/23/2013 1413   GFRNONAA >90 12/03/2012 1409   GFRAA >90 12/03/2012 1409    I No results found for this basename: SPEP, UPEP,  kappa and lambda light chains    Lab Results  Component Value Date   WBC 5.6 08/14/2013   NEUTROABS 3.9 08/14/2013   HGB 13.0 08/14/2013   HCT 38.4 08/14/2013   MCV 92.3 08/14/2013   PLT 227 08/14/2013      Chemistry      Component Value Date/Time   NA 139 08/14/2013 1215   NA 136 01/23/2013 1413   K 4.2 08/14/2013 1215   K 3.7 01/23/2013 1413   CL 102 01/23/2013 1413   CO2 22 08/14/2013 1215   CO2 27 01/23/2013 1413   BUN 8.8 08/14/2013 1215   BUN 12 01/23/2013 1413   CREATININE 0.7 08/14/2013 1215   CREATININE 0.68 01/23/2013 1413      Component Value Date/Time   CALCIUM 9.4 08/14/2013 1215   CALCIUM 9.3 01/23/2013 1413    ALKPHOS 65 08/14/2013 1215   ALKPHOS 74 01/23/2013 1413   AST 37* 08/14/2013 1215   AST 27 01/23/2013 1413   ALT 41 08/14/2013 1215   ALT 30 01/23/2013 1413   BILITOT 1.14 08/14/2013 1215   BILITOT 0.5 01/23/2013 1413       No results found for this basename: LABCA2    No components found with this basename: LABCA125    No results found for this basename: INR,  in the last 168 hours  Urinalysis    Component Value Date/Time   LABSPEC 1.010 07/16/2013 1538   GLUCOSEU Negative 07/16/2013 1538   UROBILINOGEN 0.2 07/16/2013 1538    STUDIES: No results found.  ASSESSMENT: 39 y.o. with T2, N33mi, stage IIB, invasive ductal carcinoma, grade 3, ER/PR positive, Ki-67 25%, HER-2/neu negative.    Status post left breast lumpectomy with left axillary sentinel lymph node biopsy on 12/10/2012 for a stage IIB, pT2, pN1mi, MX, 2.3 cm invasive ductal carcinoma, grade 3, estrogen receptor 80% positive, progesterone receptor 80% positive, Ki-67 25%, HER-2/neu negative, with 1/2 left axillary sentinel lymph nodes positive for microscopic disease.   #3 Genetic testing performed in [place], [place] with self reported by the patient as BRCA1 and BRCA2 negative.   #4 s/p Adjuvant chemotherapy consisting of FEC (5-FU/epirubicin/Cytoxan) began on 01/23/2013. She completed 6 cycles of FEC on 04/03/13.   #5. S/p adjuvant weekly Taxol beginning 04/17/13 - 06/05/13 x 7 of the 12 planned cycles. Chemotherapy stopped due to grade III skin toxicity.   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 Her dose was changed to $RemoveBe'20mg'lwUStZQRp$  daily on 04/10/13. Repeat venous Doppler of the lower extremity performed in March 2015 was negative for any thrombosis.  This was stopped on Aug 14, 2012.    PLAN:   Ms. Stull is doing well today and will complete radiation treatment next week.  We discussed her Xarelto and the  blood clot.  She will stop Xarelto and we will get a d dimer in one  month.  She will start Arimidex beginning after the completion of radiation therapy and will continue to receive Zoladex every 4 weeks.  I did order a bone density.  She can have her port removed as well.    The patient will return in August for labs and evaluation by Dr. Jana Hakim.   She knows to call us in the interim for any questions or concerns.  We can certainly see her sooner if needed.  I spent 25 minutes counseling the patient face to face.  The total time spent in the appointment was 30 minutes.  Minette Headland, [place] [phone] 08/14/2013 1:26 PM

## 2013-08-14 NOTE — Patient Instructions (Signed)
Goserelin injection What is this medicine? GOSERELIN (GOE se rel in) is similar to a hormone found in the body. It lowers the amount of sex hormones that the body makes. Men will have lower testosterone levels and women will have lower estrogen levels while taking this medicine. In men, this medicine is used to treat prostate cancer; the injection is either given once per month or once every 12 weeks. A once per month injection (only) is used to treat women with endometriosis, dysfunctional uterine bleeding, or advanced breast cancer. This medicine may be used for other purposes; ask your health care provider or pharmacist if you have questions. COMMON BRAND NAME(S): Zoladex What should I tell my health care provider before I take this medicine? They need to know if you have any of these conditions (some only apply to women): -diabetes -heart disease or previous heart attack -high blood pressure -high cholesterol -kidney disease -osteoporosis or low bone density -problems passing urine -spinal cord injury -stroke -tobacco smoker -an unusual or allergic reaction to goserelin, hormone therapy, other medicines, foods, dyes, or preservatives -pregnant or trying to get pregnant -breast-feeding How should I use this medicine? This medicine is for injection under the skin. It is given by a health care professional in a hospital or clinic setting. Men receive this injection once every 4 weeks or once every 12 weeks. Women will only receive the once every 4 weeks injection. Talk to your pediatrician regarding the use of this medicine in children. Special care may be needed. Overdosage: If you think you have taken too much of this medicine contact a poison control center or emergency room at once. NOTE: This medicine is only for you. Do not share this medicine with others. What if I miss a dose? It is important not to miss your dose. Call your doctor or health care professional if you are unable to  keep an appointment. What may interact with this medicine? -female hormones like estrogen -herbal or dietary supplements like black cohosh, chasteberry, or DHEA -female hormones like testosterone -prasterone This list may not describe all possible interactions. Give your health care provider a list of all the medicines, herbs, non-prescription drugs, or dietary supplements you use. Also tell them if you smoke, drink alcohol, or use illegal drugs. Some items may interact with your medicine. What should I watch for while using this medicine? Visit your doctor or health care professional for regular checks on your progress. Your symptoms may appear to get worse during the first weeks of this therapy. Tell your doctor or healthcare professional if your symptoms do not start to get better or if they get worse after this time. Your bones may get weaker if you take this medicine for a long time. If you smoke or frequently drink alcohol you may increase your risk of bone loss. A family history of osteoporosis, chronic use of drugs for seizures (convulsions), or corticosteroids can also increase your risk of bone loss. Talk to your doctor about how to keep your bones strong. This medicine should stop regular monthly menstration in women. Tell your doctor if you continue to menstrate. Women should not become pregnant while taking this medicine or for 12 weeks after stopping this medicine. Women should inform their doctor if they wish to become pregnant or think they might be pregnant. There is a potential for serious side effects to an unborn child. Talk to your health care professional or pharmacist for more information. Do not breast-feed an infant while taking   this medicine. Men should inform their doctors if they wish to father a child. This medicine may lower sperm counts. Talk to your health care professional or pharmacist for more information. What side effects may I notice from receiving this  medicine? Side effects that you should report to your doctor or health care professional as soon as possible: -allergic reactions like skin rash, itching or hives, swelling of the face, lips, or tongue -bone pain -breathing problems -changes in vision -chest pain -feeling faint or lightheaded, falls -fever, chills -pain, swelling, warmth in the leg -pain, tingling, numbness in the hands or feet -swelling of the ankles, feet, hands -trouble passing urine or change in the amount of urine -unusually high or low blood pressure -unusually weak or tired Side effects that usually do not require medical attention (report to your doctor or health care professional if they continue or are bothersome): -change in sex drive or performance -changes in breast size in both males and females -changes in emotions or moods -headache -hot flashes -irritation at site where injected -loss of appetite -skin problems like acne, dry skin -vaginal dryness This list may not describe all possible side effects. Call your doctor for medical advice about side effects. You may report side effects to FDA at 1-800-FDA-1088. Where should I keep my medicine? This drug is given in a hospital or clinic and will not be stored at home. NOTE: This sheet is a summary. It may not cover all possible information. If you have questions about this medicine, talk to your doctor, pharmacist, or health care provider.  2014, [employer]vier/Gold Standard. (2008-07-20 13:28:29)  

## 2013-08-14 NOTE — Patient Instructions (Signed)

## 2013-08-17 ENCOUNTER — Ambulatory Visit
Admission: RE | Admit: 2013-08-17 | Discharge: 2013-08-17 | Disposition: A | Discharge status: Home / Self Care | Payer: BC Managed Care – PPO | Source: Ambulatory Visit | Attending: Radiation Oncology | Admitting: Radiation Oncology

## 2013-08-17 VITALS — BP 125/91 | HR 88 | Temp 97.8°F | Wt 241.2 lb

## 2013-08-17 DIAGNOSIS — C50319 Malignant neoplasm of lower-inner quadrant of unspecified female breast: Secondary | ICD-10-CM

## 2013-08-17 NOTE — Progress Notes (Signed)
Weekly Management Note:  Site: Left breast boost Current Dose:  800  cGy Projected Dose: 1400  cGy  Narrative: The patient is seen today for routine under treatment assessment. CBCT/MVCT images/port films were reviewed. The chart was reviewed.   She is without new complaints today. She still has areas of desquamation along the left clavicle, axilla and inframammary region where she uses Neosporin ointment.  Physical Examination:  Filed Vitals:   08/17/13 1632  BP: 125/91  Pulse: 88  Temp: 97.8 F (36.6 C)  .  Weight: 241 lb 3.2 oz (109.408 kg). Examination is essentially unchanged. There is reepithelialization along the clavicular, axillary, and inframammary regions.  Impression: Tolerating radiation therapy well. She will finish radiation therapy this Thursday.  Plan: Continue radiation therapy as planned.

## 2013-08-17 NOTE — Progress Notes (Signed)
Patient for weekly assessment of radiation to left breast.Treatment to be complete on Thursday.Wet peel of left clavicle is uncomfortable.Using neosporin.Dry peel of axilla and mammary fold is healing.Resumed lisinipril, no longer on xarelto.Has arimidex to start on Friday of this week.

## 2013-08-18 ENCOUNTER — Ambulatory Visit
Admission: RE | Admit: 2013-08-18 | Discharge: 2013-08-18 | Disposition: A | Discharge status: Home / Self Care | Payer: BC Managed Care – PPO | Source: Ambulatory Visit | Attending: Radiation Oncology | Admitting: Radiation Oncology

## 2013-08-19 ENCOUNTER — Telehealth: Payer: Self-pay | Admitting: *Deleted

## 2013-08-19 ENCOUNTER — Telehealth: Payer: Self-pay | Admitting: Adult Health

## 2013-08-19 ENCOUNTER — Ambulatory Visit
Admission: RE | Admit: 2013-08-19 | Discharge: 2013-08-19 | Disposition: A | Discharge status: Home / Self Care | Payer: BC Managed Care – PPO | Source: Ambulatory Visit | Attending: Radiation Oncology | Admitting: Radiation Oncology

## 2013-08-19 ENCOUNTER — Ambulatory Visit
Admission: RE | Admit: 2013-08-19 | Discharge: 2013-08-19 | Disposition: A | Discharge status: Home / Self Care | Payer: BC Managed Care – PPO | Source: Ambulatory Visit | Attending: Adult Health | Admitting: Adult Health

## 2013-08-19 DIAGNOSIS — E2839 Other primary ovarian failure: Secondary | ICD-10-CM

## 2013-08-19 NOTE — Telephone Encounter (Signed)
Called  pt to inform her that she can have port removed per NP. I told her she can call Dr. Marlou Starks to discuss with him about scheduling to have this done. Also I told her if she doesn't schedule to have this done right now, she can come to have port flushed. Pt said she would call Dr. Marlou Starks. Pt verbalized understanding. Message to be forwarded to Charlestine Massed, NP.

## 2013-08-19 NOTE — Telephone Encounter (Signed)
, °

## 2013-08-20 ENCOUNTER — Ambulatory Visit
Admission: RE | Admit: 2013-08-20 | Discharge: 2013-08-20 | Disposition: A | Discharge status: Home / Self Care | Payer: BC Managed Care – PPO | Source: Ambulatory Visit | Attending: Radiation Oncology | Admitting: Radiation Oncology

## 2013-08-20 ENCOUNTER — Other Ambulatory Visit: Payer: Self-pay | Admitting: *Deleted

## 2013-08-20 ENCOUNTER — Encounter: Payer: Self-pay | Admitting: Radiation Oncology

## 2013-08-20 ENCOUNTER — Encounter: Payer: Self-pay | Admitting: Adult Health

## 2013-08-20 DIAGNOSIS — C50319 Malignant neoplasm of lower-inner quadrant of unspecified female breast: Secondary | ICD-10-CM

## 2013-08-20 MED ORDER — ALPRAZOLAM 0.25 MG PO TABS: 0.2500 mg | ORAL_TABLET | Freq: Every evening | ORAL | Status: DC | PRN

## 2013-08-20 MED ORDER — LORAZEPAM 0.5 MG PO TABS: 0.5000 mg | ORAL_TABLET | Freq: Four times a day (QID) | ORAL | Status: DC | PRN

## 2013-08-20 NOTE — Progress Notes (Signed)
[employer], [id number], approved arimidex from 08/20/13-08/20/14 case #[id number]

## 2013-08-20 NOTE — Progress Notes (Signed)
Weekly Management Note:  Site: Left breast boost Current Dose:  1400  cGy Projected Dose: 1400  cGy  Narrative: The patient is seen today for routine under treatment assessment. CBCT/MVCT images/port films were reviewed. The chart was reviewed.   She is without new complaints today. Radiation therapy is finished today.  Physical Examination: There were no vitals filed for this visit..  Weight:  . There has been reepithelialization of her moist desquamation is along the left neck and axilla. There is dry desquamation along the left inframammary region.  Impression: Radiation therapy completed.  Plan: One-month followup visit.

## 2013-08-20 NOTE — Progress Notes (Signed)
[facility] End of Treatment Note  Name:[name]  Date: 08/20/2013 JOA:[id number] DOB:[birth year]   Status:outpatient    CC: [name]  Dr. Autumn Messing III  REFERRING PHYSICIAN: Dr. Autumn Messing III      DIAGNOSIS: Pathologic stage IIB (T2 N59mi M0) invasive ductal carcinoma of the left breast   INDICATION FOR TREATMENT: Curative   TREATMENT DATES: 07/06/2013 through 08/20/2013                          SITE/DOSE: Left breast and regional lymph nodes  4680 cGy in 26 sessions followed by left breast boost of 1400 cGy in 7 sessions                       BEAMS/ENERGY:  Tangential fields to the left breast with deep inspiration and breath-hold with 6 MV/15 MV photons. The left supraclavicular/axillary field was treated with 15 MV photons, RAO, dose prescribed at 3 cm depth. The left axilla  was boosted daily with 6 MV photons to bring the mid axillary dose up to 4500 cGy 26 sessions. The left breast was boosted with mixed 6 MV and 15 MV photons, 3 field technique also with deep inspiration breath-hold to avoid the heart.               NARRATIVE:   She tolerated treatment reasonably well although she had focal moist desquamation along her left supraclavicular region, axilla and left inframammary region. She had complete reepithelialization by completion of therapy. She uses Radioplex gel along with antibiotic ointment to areas of moist desquamation during her therapy.                         PLAN: Routine followup in one month. Patient instructed to call if questions or worsening complaints in interim.

## 2013-08-20 NOTE — Telephone Encounter (Signed)
Patient counseled on importance of not taking meds together, as previously discussed with Theresa Massed, NP Patient also states that insurance is not covering Arimidex, so I contacted Care Management for assistance.

## 2013-08-20 NOTE — Progress Notes (Addendum)
3-D simulation note: On 07/01/2013 the patient underwent 3-D simulation for treatment to her left breast and regional lymph nodes with deep inspiration and breath-hold. She was set up to medial and lateral left breast tangents with 2 unique multileaf collimators. She also was set up to a meal electronically position field for a total of 3 complex treatment devices to her left breast. She was set up RAO to the left supraclavicular region with a separate multileaf collimator, and PA with a separate multileaf collimator for a total of 5 complex treatment devices. I prescribing 4680 cGy in 26 sessions to the left breast and regional lymph nodes with mixed 6 MV and 15 MV photons. Dose volume histograms were obtained for the lungs and heart.

## 2013-08-21 ENCOUNTER — Telehealth: Payer: Self-pay | Admitting: *Deleted

## 2013-08-21 ENCOUNTER — Encounter: Payer: Self-pay | Admitting: Oncology

## 2013-08-21 NOTE — Telephone Encounter (Signed)
Called pt to inform her of bone density results. No answer, but left a detailed message that results were normal. If pt has any questions, I told her to call me @ [phone] and ask to speak to this nurse. Message to be forwarded to Charlestine Massed, NP.

## 2013-08-21 NOTE — Progress Notes (Signed)
No asst for Zoledex. Insurance is paying 100%

## 2013-09-21 ENCOUNTER — Other Ambulatory Visit (HOSPITAL_BASED_OUTPATIENT_CLINIC_OR_DEPARTMENT_OTHER): Payer: BC Managed Care – PPO

## 2013-09-21 ENCOUNTER — Ambulatory Visit
Admission: RE | Admit: 2013-09-21 | Discharge: 2013-09-21 | Disposition: A | Discharge status: Home / Self Care | Payer: BC Managed Care – PPO | Source: Ambulatory Visit | Attending: Radiation Oncology | Admitting: Radiation Oncology

## 2013-09-21 ENCOUNTER — Ambulatory Visit (HOSPITAL_BASED_OUTPATIENT_CLINIC_OR_DEPARTMENT_OTHER): Payer: BC Managed Care – PPO

## 2013-09-21 ENCOUNTER — Encounter: Payer: Self-pay | Admitting: Radiation Oncology

## 2013-09-21 VITALS — BP 139/99 | HR 88 | Temp 98.1°F | Resp 14 | Wt 241.3 lb

## 2013-09-21 VITALS — BP 138/82 | HR 91 | Temp 98.5°F

## 2013-09-21 DIAGNOSIS — C50312 Malignant neoplasm of lower-inner quadrant of left female breast: Secondary | ICD-10-CM

## 2013-09-21 DIAGNOSIS — C50319 Malignant neoplasm of lower-inner quadrant of unspecified female breast: Secondary | ICD-10-CM

## 2013-09-21 DIAGNOSIS — Z5111 Encounter for antineoplastic chemotherapy: Secondary | ICD-10-CM

## 2013-09-21 HISTORY — DX: Personal history of irradiation: Z92.3

## 2013-09-21 LAB — COMPREHENSIVE METABOLIC PANEL (CC13)
ALBUMIN: 3.7 g/dL (ref 3.5–5.0)
ALT: 41 U/L (ref 0–55)
ANION GAP: 7 meq/L (ref 3–11)
AST: 33 U/L (ref 5–34)
Alkaline Phosphatase: 70 U/L (ref 40–150)
BUN: 13.2 mg/dL (ref 7.0–26.0)
CALCIUM: 9.5 mg/dL (ref 8.4–10.4)
CO2: 27 meq/L (ref 22–29)
Chloride: 105 mEq/L (ref 98–109)
Creatinine: 0.8 mg/dL (ref 0.6–1.1)
Glucose: 118 mg/dl (ref 70–140)
POTASSIUM: 3.9 meq/L (ref 3.5–5.1)
SODIUM: 140 meq/L (ref 136–145)
TOTAL PROTEIN: 6.5 g/dL (ref 6.4–8.3)
Total Bilirubin: 1.12 mg/dL (ref 0.20–1.20)

## 2013-09-21 LAB — D-DIMER, QUANTITATIVE (NOT AT ARMC): D DIMER QUANT: 0.17 ug{FEU}/mL (ref 0.00–0.48)

## 2013-09-21 MED ORDER — GOSERELIN ACETATE 3.6 MG ~~LOC~~ IMPL
3.6000 mg | DRUG_IMPLANT | Freq: Once | SUBCUTANEOUS | Status: AC
  Administered 2013-09-21: 3.6 mg via SUBCUTANEOUS
  Filled 2013-09-21: qty 3.6

## 2013-09-21 NOTE — Progress Notes (Signed)
CC: Dr. Autumn Messing   Followup note:  [name] returns today approximately 1 month following completion of radiation therapy following conservative surgery and adjuvant chemotherapy in the management of her pathologic stage T2 N43mi M0 invasive ductal carcinoma of the left breast. She is on adjuvant Arimidex and Zoladex since she was not occasionally for tamoxifen because of a history of "blood clots ". She is without complaints today. She sees Dr. Jana Cox for a followup visit on August 4.  Physical examination: Alert and oriented. Filed Vitals:   09/21/13 1516  BP: 139/99  Pulse: 88  Temp: 98.1 F (36.7 C)  Resp: 14   Nodes: There is no palpable cervical, supraclavicular, or axillary lymphadenopathy. Chest: Lungs clear. Breasts: There is residual hyperpigmentation the skin along the left breast. There is mild to moderate thickening of the left breast. No dominant masses are appreciated. Right breast without masses or lesions. Extremities: Without edema.  Impression: Satisfactory progress. She'll maintain her followup through Dr. Jana Cox. Her last mammography was in [place] back in July of 2014. I suspect that Dr. Jana Cox will get her scheduled for mammography this fall. She prefers to have mammography here in [place] and is interested in getting her mammography at the [facility].  Plan: As above.

## 2013-09-21 NOTE — Progress Notes (Signed)
She is currently in no pain. Pt complains of, Fatigue.  Pt left breast positive for skin color change Pt continues a active lifestyle and eat a healthy diet.

## 2013-09-22 ENCOUNTER — Ambulatory Visit: Payer: BC Managed Care – PPO | Admitting: Radiation Oncology

## 2013-10-13 ENCOUNTER — Encounter (INDEPENDENT_AMBULATORY_CARE_PROVIDER_SITE_OTHER): Payer: Self-pay | Admitting: General Surgery

## 2013-10-13 ENCOUNTER — Ambulatory Visit (INDEPENDENT_AMBULATORY_CARE_PROVIDER_SITE_OTHER): Payer: BC Managed Care – PPO | Admitting: General Surgery

## 2013-10-13 VITALS — BP 124/74 | HR 86 | Temp 97.0°F | Ht 68.0 in | Wt 238.0 lb

## 2013-10-13 DIAGNOSIS — C50319 Malignant neoplasm of lower-inner quadrant of unspecified female breast: Secondary | ICD-10-CM

## 2013-10-13 DIAGNOSIS — C50312 Malignant neoplasm of lower-inner quadrant of left female breast: Secondary | ICD-10-CM

## 2013-10-13 NOTE — Progress Notes (Signed)
Subjective:     Patient ID: [name], female   DOB: August 30, 1975, 38 y.o.   MRN: [id number]  HPI The patient is a 38 year old white female who is 9 months status post left breast lumpectomy and sentinel node biopsy for a T2 N1 MIC left breast cancer. She finished chemotherapy and radiation therapy. She tolerated it well. She has no complaints today. She feels as though she is ready to have her Port-A-Cath removed  Review of Systems  Constitutional: Negative.   HENT: Negative.   Eyes: Negative.   Respiratory: Negative.   Cardiovascular: Negative.   Gastrointestinal: Negative.   Endocrine: Negative.   Genitourinary: Negative.   Musculoskeletal: Negative.   Skin: Negative.   Allergic/Immunologic: Negative.   Neurological: Negative.   Hematological: Negative.   Psychiatric/Behavioral: Negative.        Objective:   Physical Exam  Constitutional: She is oriented to person, place, and time. She appears well-developed and well-nourished.  HENT:  Head: Normocephalic and atraumatic.  Eyes: Conjunctivae and EOM are normal. Pupils are equal, round, and reactive to light.  Neck: Normal range of motion. Neck supple.  Cardiovascular: Normal rate, regular rhythm and normal heart sounds.   Pulmonary/Chest: Effort normal and breath sounds normal.  There is some thickening of the skin of the medial left breast secondary to radiation. There is some firmness to the scar in this location. Otherwise there is no palpable mass in either breast. There is no palpable axillary, supraclavicular, or cervical lymphadenopathy.  Abdominal: Soft. Bowel sounds are normal.  Musculoskeletal: Normal range of motion.  Lymphadenopathy:    She has no cervical adenopathy.  Neurological: She is alert and oriented to person, place, and time.  Skin: Skin is warm and dry.  Psychiatric: She has a normal mood and affect. Her behavior is normal.       Assessment:     The patient is 9 months status post left breast  lumpectomy for breast cancer     Plan:     At this point she would like to have her Port-A-Cath removed. I discussed with her in detail the risks and benefits of the operation to remove the Port-A-Cath as well as some of the technical aspects and she understands and wishes to proceed. Plan to remove the Port-A-Cath.

## 2013-10-19 ENCOUNTER — Other Ambulatory Visit: Payer: Self-pay | Admitting: *Deleted

## 2013-10-19 DIAGNOSIS — C50312 Malignant neoplasm of lower-inner quadrant of left female breast: Secondary | ICD-10-CM

## 2013-10-20 ENCOUNTER — Ambulatory Visit: Payer: BC Managed Care – PPO

## 2013-10-20 ENCOUNTER — Other Ambulatory Visit (HOSPITAL_BASED_OUTPATIENT_CLINIC_OR_DEPARTMENT_OTHER): Payer: BC Managed Care – PPO

## 2013-10-20 ENCOUNTER — Ambulatory Visit (HOSPITAL_BASED_OUTPATIENT_CLINIC_OR_DEPARTMENT_OTHER): Payer: BC Managed Care – PPO | Admitting: Oncology

## 2013-10-20 ENCOUNTER — Telehealth: Payer: Self-pay | Admitting: Oncology

## 2013-10-20 VITALS — BP 145/83 | HR 89 | Temp 98.1°F | Resp 18 | Ht 68.0 in | Wt 239.8 lb

## 2013-10-20 DIAGNOSIS — C50319 Malignant neoplasm of lower-inner quadrant of unspecified female breast: Secondary | ICD-10-CM

## 2013-10-20 DIAGNOSIS — Z86718 Personal history of other venous thrombosis and embolism: Secondary | ICD-10-CM

## 2013-10-20 DIAGNOSIS — C50312 Malignant neoplasm of lower-inner quadrant of left female breast: Secondary | ICD-10-CM

## 2013-10-20 DIAGNOSIS — Z5111 Encounter for antineoplastic chemotherapy: Secondary | ICD-10-CM

## 2013-10-20 LAB — CBC WITH DIFFERENTIAL/PLATELET
BASO%: 0.7 % (ref 0.0–2.0)
Basophils Absolute: 0.1 10*3/uL (ref 0.0–0.1)
EOS%: 2.6 % (ref 0.0–7.0)
Eosinophils Absolute: 0.2 10*3/uL (ref 0.0–0.5)
HCT: 37.8 % (ref 34.8–46.6)
HGB: 12.9 g/dL (ref 11.6–15.9)
LYMPH#: 1.9 10*3/uL (ref 0.9–3.3)
LYMPH%: 26.1 % (ref 14.0–49.7)
MCH: 32.1 pg (ref 25.1–34.0)
MCHC: 34.2 g/dL (ref 31.5–36.0)
MCV: 93.8 fL (ref 79.5–101.0)
MONO#: 0.5 10*3/uL (ref 0.1–0.9)
MONO%: 7.4 % (ref 0.0–14.0)
NEUT#: 4.6 10*3/uL (ref 1.5–6.5)
NEUT%: 63.2 % (ref 38.4–76.8)
Platelets: 254 10*3/uL (ref 145–400)
RBC: 4.03 10*6/uL (ref 3.70–5.45)
RDW: 14.5 % (ref 11.2–14.5)
WBC: 7.3 10*3/uL (ref 3.9–10.3)

## 2013-10-20 LAB — COMPREHENSIVE METABOLIC PANEL (CC13)
ALT: 28 U/L (ref 0–55)
AST: 22 U/L (ref 5–34)
Albumin: 3.6 g/dL (ref 3.5–5.0)
Alkaline Phosphatase: 77 U/L (ref 40–150)
Anion Gap: 7 mEq/L (ref 3–11)
BUN: 11.1 mg/dL (ref 7.0–26.0)
CALCIUM: 9.4 mg/dL (ref 8.4–10.4)
CHLORIDE: 105 meq/L (ref 98–109)
CO2: 29 mEq/L (ref 22–29)
Creatinine: 0.8 mg/dL (ref 0.6–1.1)
Glucose: 146 mg/dl — ABNORMAL HIGH (ref 70–140)
Potassium: 3.6 mEq/L (ref 3.5–5.1)
SODIUM: 141 meq/L (ref 136–145)
TOTAL PROTEIN: 6.7 g/dL (ref 6.4–8.3)
Total Bilirubin: 0.79 mg/dL (ref 0.20–1.20)

## 2013-10-20 MED ORDER — ALPRAZOLAM 0.25 MG PO TABS: 0.2500 mg | ORAL_TABLET | Freq: Every evening | ORAL | Status: DC | PRN

## 2013-10-20 MED ORDER — GOSERELIN ACETATE 3.6 MG ~~LOC~~ IMPL
3.6000 mg | DRUG_IMPLANT | Freq: Once | SUBCUTANEOUS | Status: AC
  Administered 2013-10-20: 3.6 mg via SUBCUTANEOUS
  Filled 2013-10-20: qty 3.6

## 2013-10-20 MED ORDER — LORAZEPAM 0.5 MG PO TABS: 0.5000 mg | ORAL_TABLET | Freq: Every evening | ORAL | Status: DC | PRN

## 2013-10-20 NOTE — Progress Notes (Signed)
ID: [name] OB: 12-18-1975  MR#: [id number]  YYT#:[id number]  PCP: [name] GYN:   SU: Dr. Autumn Messing OTHER MD: Dr. 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 An ultrasound on 10/06/2012 showed a 1.6 x 1.3 x 1.3 cm mass at the 8 o'clock position 7 cm from the nipple. At the 12 o'clock position behind and nipple was a circumscribed oval shaped 2 x 1.8 x 1.2 cm lesion perhaps representing a fibroadenoma. 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 The carcinoma was grade 2-3, estrogen receptor 80% positive, progesterone receptor 80% positive, Ki-67 25%, and HER-2/neu negative. Bilateral breast MRI on 10/22/2012 showed biopsy proven carcinoma in the left lower inner quadrant identified and measures approximately 1.9 x 1.9 x 1.8 cm. Well-defined nodule in the right upper outer quadrant posteriorly consistent with a fibroadenoma. Probable liver cysts.There were multiple scattered nonspecific foci in each breast. No other suspicious abnormality was identified. No abnormal appearing lymph nodes.   The patient's subsequent history is as detailed below  INTERVAL HISTORY: [name] returns today for followup of her breast cancer. She is establishing herself unless service today. Since her last visit here she completed her radiation treatments. She tolerated these well, with some fatigue and some desquamation. Both of these are improving.  REVIEW OF SYSTEMS: She is having moderate hot flashes. This can wake her up. She's had minimal epistaxis. She continues to work full-time, and with a teenager at  home this doesn't leave her a lot of time for exercise. She has some vaginal dryness problems. A detailed review of systems today was otherwise noncontributory  PAST MEDICAL HISTORY: Past Medical History  Diagnosis Date  . Urinary incontinence     occasional  . Family history of anesthesia complication     pt's mother has hx. of post-op N/V  . Hypertension     under control with med., has been on med. since 09/2012  . Anxiety   . Breast cancer 10/16/12    left, lower medial, 8 o'clock  . History of radiation therapy 07/06/13-08/20/13    left breast/    PAST SURGICAL HISTORY: Past Surgical History  Procedure Laterality Date  . Cesarean section  1999  . Breast lumpectomy with needle localization and axillary sentinel lymph node bx Left 12/10/2012    Procedure: BREAST LUMPECTOMY WITH NEEDLE LOCALIZATION AND AXILLARY SENTINEL LYMPH NODE BX;  Surgeon: Merrie Roof, MD;  Location: [facility];  Service: General;  Laterality: Left;  Needle loc BCG 7:30  nuc med 9:30    . Tonsillectomy and adenoidectomy      as a teenager  . Cholecystectomy  1999  . Open reduction internal fixation (orif) tibia/fibula fracture Right   . Portacath placement Right 01/09/2013    Procedure: INSERTION PORT-A-CATH;  Surgeon: Merrie Roof, MD;  Location: [facility];  Service: General;  Laterality: Right;    FAMILY HISTORY Family History  Problem Relation Age of Onset  . Breast cancer Maternal Grandmother   . Lung cancer Paternal Grandmother   . 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 She is divorced.  She ent lives with her daughter [name] who is 60 years old as of August of 2015.    ADVANCED DIRECTIVES: Not in place. At her 10/20/2013 visit the patient was given the appropriate documents to complete and notarize at her discretion. She tells me she intends to name her mother, [name], as healthcare power of attorney. She can be reached at [phone]   HEALTH MAINTENANCE: History  Substance Use Topics  . Smoking status: Former Smoker    Quit date: 12/03/2012  . Smokeless tobacco: Never Used  . Alcohol Use: Yes     Comment: occasionally    colonoscopy: Pap Smear: 03/2013 Eye Exam: 08/2012 Vitamin D Level:   Lipid Panel:    Allergies  Allergen Reactions  . Amoxicillin Other (See Comments)    States "it does not work"    Current Outpatient Prescriptions  Medication Sig Dispense Refill  . ALPRAZolam (XANAX) 0.25 MG tablet Take 1 tablet (0.25 mg total) by mouth at bedtime as needed for sleep.  30 tablet  0  . anastrozole (ARIMIDEX) 1 MG tablet Take 1 tablet (1 mg total) by mouth daily.  30 tablet  6  . lidocaine-prilocaine (EMLA) cream Apply topically as needed.  30 g  0  . lisinopril (PRINIVIL,ZESTRIL) 10 MG tablet Take 1 tablet (10 mg total) by mouth daily.  30 tablet  3  . LORazepam (ATIVAN) 0.5 MG tablet Take 1 tablet (0.5 mg total) by mouth every 6 (six) hours as needed (Nausea or vomiting).  30 tablet  0   No current facility-administered medications for this visit.    OBJECTIVE: Young white female in no acute distress Filed Vitals:   10/20/13 1543  BP: 145/83  Pulse: 89  Temp: 98.1 F (36.7 C)  Resp: 18     Body mass index is 36.47 kg/(m^2).      ECOG FS:1 - Symptomatic but completely ambulatory  Sclerae unicteric, pupils equal and reactive Oropharynx clear and moist-- no thrush or other lesions noted No cervical or supraclavicular adenopathy Lungs no rales or rhonchi Heart regular rate and rhythm Abd soft, nontender, positive bowel sounds MSK no focal  spinal tenderness, no upper extremity lymphedema Neuro: nonfocal, well oriented, appropriate affect Breasts: The right breast is unremarkable. The left breast is status post lumpectomy and radiation. There is mild hyperpigmentation, but no desquamation. The left axilla is benign.  LAB RESULTS:  CMP     Component Value Date/Time   NA 141 10/20/2013 1523   NA 136 01/23/2013 1413   K 3.6 10/20/2013 1523   K 3.7 01/23/2013 1413   CL 102 01/23/2013 1413   CO2 29 10/20/2013 1523   CO2 27 01/23/2013 1413   GLUCOSE 146* 10/20/2013 1523   GLUCOSE 112* 01/23/2013 1413   BUN 11.1 10/20/2013 1523   BUN 12 01/23/2013 1413   CREATININE 0.8 10/20/2013 1523   CREATININE 0.68 01/23/2013 1413   CALCIUM 9.4 10/20/2013 1523   CALCIUM 9.3 01/23/2013 1413   PROT 6.7 10/20/2013 1523   PROT 6.5 01/23/2013 1413   ALBUMIN 3.6 10/20/2013 1523   ALBUMIN 3.2* 01/23/2013 1413   AST 22 10/20/2013 1523  AST 27 01/23/2013 1413   ALT 28 10/20/2013 1523   ALT 30 01/23/2013 1413   ALKPHOS 77 10/20/2013 1523   ALKPHOS 74 01/23/2013 1413   BILITOT 0.79 10/20/2013 1523   BILITOT 0.5 01/23/2013 1413   GFRNONAA >90 12/03/2012 1409   GFRAA >90 12/03/2012 1409    I No results found for this basename: SPEP,  UPEP,   kappa and lambda light chains    Lab Results  Component Value Date   WBC 7.3 10/20/2013   NEUTROABS 4.6 10/20/2013   HGB 12.9 10/20/2013   HCT 37.8 10/20/2013   MCV 93.8 10/20/2013   PLT 254 10/20/2013      Chemistry      Component Value Date/Time   NA 141 10/20/2013 1523   NA 136 01/23/2013 1413   K 3.6 10/20/2013 1523   K 3.7 01/23/2013 1413   CL 102 01/23/2013 1413   CO2 29 10/20/2013 1523   CO2 27 01/23/2013 1413   BUN 11.1 10/20/2013 1523   BUN 12 01/23/2013 1413   CREATININE 0.8 10/20/2013 1523   CREATININE 0.68 01/23/2013 1413      Component Value Date/Time   CALCIUM 9.4 10/20/2013 1523   CALCIUM 9.3 01/23/2013 1413   ALKPHOS 77 10/20/2013 1523   ALKPHOS 74 01/23/2013 1413   AST 22 10/20/2013 1523   AST 27 01/23/2013 1413   ALT 28 10/20/2013  1523   ALT 30 01/23/2013 1413   BILITOT 0.79 10/20/2013 1523   BILITOT 0.5 01/23/2013 1413       No results found for this basename: LABCA2    No components found with this basename: [id number]    No results found for this basename: INR,  in the last 168 hours  Urinalysis    Component Value Date/Time   LABSPEC 1.010 07/16/2013 1538   GLUCOSEU Negative 07/16/2013 1538   UROBILINOGEN 0.2 07/16/2013 1538    STUDIES: CLINICAL DATA: Taking antiestrogen medication for breast cancer.  Premenopausal.  EXAM:  DUAL X-RAY ABSORPTIOMETRY (DXA) FOR BONE MINERAL DENSITY  FINDINGS:  AP LUMBAR SPINE  Bone Mineral Density (BMD): 0.972 g/cm2  Z-Score: -0.5  LEFT FEMUR NECK  Bone Mineral Density (BMD): 0.775 g/cm2  Z-Score: -0.4  ASSESSMENT: The patient's Z-score is within expected range for age  FRACTURE RISK: Not increased  FRAX: World Health Organization FRAX assessment of absolute fracture  risk is not calculated for this patient because of premenopausal  status.  COMPARISON: None.   ASSESSMENT: 38 y.o. BRCA negative [place] woman  status post left breast lumpectomy with left axillary sentinel lymph node biopsy 12/10/2012 for a pT2, pN21m, stage IIB invasive ductal carcinoma, grade 3, estrogen receptor 80% positive, progesterone receptor 80% positive, Ki-67 25%, HER-2/neu negative  [place]1 and BRCA2 negative.   #2 s/p adjuvant chemotherapy consisting of FEC (5-FU/epirubicin/Cytoxan) begun on 01/23/2013,completed on 04/03/13 (six cycles).   #3 S/p adjuvant weekly Taxol beginning 04/17/13, received  7 of the 12 planned cycles, last dose 06/05/13 (stopped secondary to skin toxicity)  #6 adjuvant radiation to the left breast and regional lymph nodes completed 08/20/2013  #7 goserelin started 04/17/2013, continued monthly  #8 anastrozole started 08/14/2013; bone density 08/19/2013 was normal  #6  Right lower extremity DVT diagnosed on 03/16/2013, patient was placed on Xarelto. Repeat venous Doppler performed in March 2015 was negative for any thrombosis.  Xarelto was stopped Aug 14, 2012.    PLAN:  We spent approximately 50 minutes today going over [name]'s situation in detail. She is tolerating her adjuvant antiestrogen therapy well. She is attracted to the idea of bilateral salpingo-oophorectomy so as to avoid the monthly trips to the cancer center for the goserelin shots. We are referring her to Dr. Denman George for consideration of this surgery.  We discussed the standard of care at this point, which does not include restaging studies in the absence of specific symptoms to evaluate. She understands the risk of radiation exposure, the unnecessary cost, and the possibility of false positives.  Accordingly we will continue to see her on an every 3 month basis for at least another year, then switch to every 6 months. She is already scheduled to have her port removed August 28. The plan for now is to continue anastrozole for 5 years then decide whether or not to continue with further estrogen suppression.  Kylea has a good understanding of the overall plan. She agrees with it. She knows the goal of treatment in her case is cure. She will call with any problems that may develop before her next visit here.   Chauncey Cruel, MD  10/20/2013 4:05 PM

## 2013-10-20 NOTE — Telephone Encounter (Signed)
per pof to sch pt mamma-sch inj and gave pt copy of sch

## 2013-10-26 ENCOUNTER — Telehealth: Payer: Self-pay | Admitting: Oncology

## 2013-10-28 ENCOUNTER — Telehealth: Payer: Self-pay | Admitting: *Deleted

## 2013-10-28 NOTE — Telephone Encounter (Signed)
Left message on voicemail with details of future appointment on 11/05/2013 @ 1:45 with Dr. Denman George. Pt was asked to call back at [phone] to verify details were received.

## 2013-11-05 ENCOUNTER — Ambulatory Visit: Payer: BC Managed Care – PPO | Admitting: Gynecologic Oncology

## 2013-11-11 ENCOUNTER — Telehealth: Payer: Self-pay | Admitting: Oncology

## 2013-11-11 NOTE — Telephone Encounter (Signed)
Sent records to [facility]

## 2013-11-12 ENCOUNTER — Encounter: Payer: Self-pay | Admitting: Gynecologic Oncology

## 2013-11-12 ENCOUNTER — Ambulatory Visit: Payer: BC Managed Care – PPO | Attending: Gynecologic Oncology | Admitting: Gynecologic Oncology

## 2013-11-12 ENCOUNTER — Other Ambulatory Visit (HOSPITAL_COMMUNITY)
Admission: RE | Admit: 2013-11-12 | Discharge: 2013-11-12 | Disposition: A | Discharge status: Home / Self Care | Payer: BC Managed Care – PPO | Source: Ambulatory Visit | Attending: Gynecologic Oncology | Admitting: Gynecologic Oncology

## 2013-11-12 VITALS — BP 131/72 | HR 93 | Temp 98.6°F | Resp 20 | Ht 68.0 in | Wt 240.3 lb

## 2013-11-12 DIAGNOSIS — C50319 Malignant neoplasm of lower-inner quadrant of unspecified female breast: Secondary | ICD-10-CM

## 2013-11-12 DIAGNOSIS — Z9221 Personal history of antineoplastic chemotherapy: Secondary | ICD-10-CM | POA: Diagnosis not present

## 2013-11-12 DIAGNOSIS — Z86718 Personal history of other venous thrombosis and embolism: Secondary | ICD-10-CM | POA: Diagnosis not present

## 2013-11-12 DIAGNOSIS — R87810 Cervical high risk human papillomavirus (HPV) DNA test positive: Secondary | ICD-10-CM | POA: Diagnosis present

## 2013-11-12 DIAGNOSIS — Z124 Encounter for screening for malignant neoplasm of cervix: Secondary | ICD-10-CM | POA: Insufficient documentation

## 2013-11-12 DIAGNOSIS — Z1151 Encounter for screening for human papillomavirus (HPV): Secondary | ICD-10-CM | POA: Diagnosis present

## 2013-11-12 DIAGNOSIS — F411 Generalized anxiety disorder: Secondary | ICD-10-CM | POA: Diagnosis not present

## 2013-11-12 DIAGNOSIS — Z853 Personal history of malignant neoplasm of breast: Secondary | ICD-10-CM | POA: Insufficient documentation

## 2013-11-12 DIAGNOSIS — C50312 Malignant neoplasm of lower-inner quadrant of left female breast: Secondary | ICD-10-CM

## 2013-11-12 DIAGNOSIS — Z79899 Other long term (current) drug therapy: Secondary | ICD-10-CM | POA: Diagnosis not present

## 2013-11-12 DIAGNOSIS — I10 Essential (primary) hypertension: Secondary | ICD-10-CM | POA: Diagnosis not present

## 2013-11-12 DIAGNOSIS — Z87891 Personal history of nicotine dependence: Secondary | ICD-10-CM | POA: Insufficient documentation

## 2013-11-12 DIAGNOSIS — Z880 Allergy status to penicillin: Secondary | ICD-10-CM | POA: Insufficient documentation

## 2013-11-12 NOTE — Patient Instructions (Signed)
Plan for surgery with Dr. Janie Morning on November 17 at [facility].                  Preparing for your Surgery  Pre-operative Testing -You will receive a phone call from presurgical testing at [facility] to arrange for a pre-operative testing appointment before your surgery.  This appointment normally occurs one to two weeks before your scheduled surgery.   -Bring your insurance card, copy of an advanced directive if applicable, medication list  -At that visit, you will be asked to sign a consent for a possible blood transfusion in case a transfusion becomes necessary during surgery.  The need for a blood transfusion is rare but having consent is a necessary part of your care.     Day Before Surgery at [facility] will be asked to take in only clear liquids the day before surgery.  Examples of clear liquids include broths, jello, and clear juices.  You will be advised to have nothing to eat or drink after midnight the evening before.    Your role in recovery Your role is to become active as soon as directed by your doctor, while still giving yourself time to heal.  Rest when you feel tired. You will be asked to do the following in order to speed your recovery:  - Cough and breathe deeply. This helps toclear and expand your lungs and can prevent pneumonia. You may be given a spirometer to practice deep breathing. A staff member will show you how to use the spirometer. - Do mild physical activity. Walking or moving your legs help your circulation and body functions return to normal. A staff member will help you when you try to walk and will provide you with simple exercises. Do not try to get up or walk alone the first time. - Actively manage your pain. Managing your pain lets you move in comfort. We will ask you to rate your pain on a scale of zero to 10. It is your responsibility to tell your doctor or nurse where and how much you hurt so your pain can be treated.  Special  Considerations -If you are diabetic, you may be placed on insulin after surgery to have closer control over your blood sugars to promote healing and recovery.  This does not mean that you will be discharged on insulin.  If applicable, your oral antidiabetics will be resumed when you are tolerating a solid diet.  -Your final pathology results from surgery should be available by the Friday after surgery and the results will be relayed to you when available.

## 2013-11-12 NOTE — Progress Notes (Signed)
Consult Note: Gyn-Onc  Consult was requested by Dr. Jana Hakim for the evaluation of [name] 38 y.o. female with estrogen receptor positive breast cancer desiring surgical menopause for therapeutic indication.  CC:  Chief Complaint  Patient presents with  . Breast Cancer    Assessment/Plan:  [name]  is a 38 y.o.  year old who is seen in consultation at the request of Dr. Jana Hakim for evaluation for therapeutic surgical menopause as part of the treatment of premenopausal estrogen, progesterone receptor positive stage II breast cancer.  Overall [name] is a good surgical candidate. Major operative risks include history of prior surgeries (cesarean section and open cholecystectomy), VTE (she has a history of DVT) and her obesity (BMI 36.5kg/m2). I discussed that she would be a candidate for her a laparoscopic approach with bilateral salpingo-oophorectomy, as part of an outpatient procedure. We discussed operative risks including  bleeding, infection, damage to internal organs (such as bladder,ureters, bowels), blood clot, reoperation and rehospitalization. The patient is motivated to have surgery in November of and time it with time off of work around the holidays. I discussed that I will not be available at this time, however my partner Dr. Janie Morning is available to perform the surgery at that time. We will discuss discontinuation of her in the next perioperatively, with Dr. Jana Hakim.  We will recommend Lovenox postop for prophylaxis for 2-4 weeks given her prior history of DVT and increased risks for recurrent VTE around the time of pelvic surgeries.  I will followup today's pap and HPV status and follow ASCCP guidelines regarding further workup and intervention if results are positive.  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 She underwent a course of  therapy with left breast lumpectomy and left axillary sentinel lymph node biopsy, adjuvant chemotherapy with 5-FU, epirubicin, and Cytoxan, weekly Taxol, adjuvant radiation to the left breast and regional lymph nodes, goserelin and anastrozole. 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 She receives Xarelto therapy until May 2014 at which time it was discontinued when ultrasound confirmed no visualized thrombosis. She is experiencing significant dissatisfaction with her monthly goserelin injection and desires definitive surgical menopause.  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 She is no other history of abnormal Pap smears, or cervical procedures. She has been amenorrheic since September of 2014 during chemotherapy treatment (including no resumption of menses after completion of chemotherapy).  Interval History: She continues to be amenorrheic.  Current Meds:  Outpatient Encounter Prescriptions as of 11/12/2013  Medication Sig  . ALPRAZolam (XANAX) 0.25 MG tablet Take 1 tablet (0.25 mg total) by mouth at bedtime as needed for sleep.  Marland Kitchen anastrozole (ARIMIDEX) 1 MG tablet Take 1 tablet (1 mg total) by mouth daily.  Marland Kitchen lidocaine-prilocaine (EMLA) cream Apply topically as needed.  Marland Kitchen lisinopril (PRINIVIL,ZESTRIL) 10 MG tablet Take 1 tablet (10 mg total) by mouth daily.  Marland Kitchen LORazepam (ATIVAN) 0.5 MG tablet Take 1 tablet (0.5 mg total) by mouth at bedtime as needed (Nausea or vomiting).    Allergy:  Allergies  Allergen Reactions  . Amoxicillin Other (See Comments)    States "it does not work"    Social Hx:   History  Social History  . Marital Status: Divorced    Spouse Name: N/A    Number of Children: N/A  . Years of Education: N/A   Occupational History  . Not on file.   Social History Main Topics   . Smoking status: Former Smoker    Quit date: 12/03/2012  . Smokeless tobacco: Never Used  . Alcohol Use: Yes     Comment: occasionally  . Drug Use: No  . Sexual Activity: Yes    Birth Control/ Protection: Condom     Comment: menarche age 51, P64 @ age 30,  last menstrual cycle 09/29/12   Other Topics Concern  . Not on file   Social History Narrative  . No narrative on file    Past Surgical Hx:  Past Surgical History  Procedure Laterality Date  . Cesarean section  1999  . Breast lumpectomy with needle localization and axillary sentinel lymph node bx Left 12/10/2012    Procedure: BREAST LUMPECTOMY WITH NEEDLE LOCALIZATION AND AXILLARY SENTINEL LYMPH NODE BX;  Surgeon: Merrie Roof, MD;  Location: [facility];  Service: General;  Laterality: Left;  Needle loc BCG 7:30  nuc med 9:30    . Tonsillectomy and adenoidectomy      as a teenager  . Cholecystectomy  1999  . Open reduction internal fixation (orif) tibia/fibula fracture Right   . Portacath placement Right 01/09/2013    Procedure: INSERTION PORT-A-CATH;  Surgeon: Merrie Roof, MD;  Location: [facility];  Service: General;  Laterality: Right;    Past Medical Hx:  Past Medical History  Diagnosis Date  . Urinary incontinence     occasional  . Family history of anesthesia complication     pt's mother has hx. of post-op N/V  . Hypertension     under control with med., has been on med. since 09/2012  . Anxiety   . Breast cancer 10/16/12    left, lower medial, 8 o'clock  . History of radiation therapy 07/06/13-08/20/13    left breast/    Past Gynecological History:  G1 (cesarean), HRHPV positive on screening pap (normal cytology) in July 2014)  No LMP recorded. Patient is not currently having periods (Reason: Chemotherapy).  Family Hx:  Family History  Problem Relation Age of Onset  . Breast cancer Maternal Grandmother   . Lung cancer Paternal Grandmother   . Anesthesia problems Mother     post-op N/V     Review of Systems:  Constitutional  Feels well,    ENT Normal appearing ears and nares bilaterally Skin/Breast  No rash, sores, jaundice, itching, dryness Cardiovascular  No chest pain, shortness of breath, or edema  Pulmonary  No cough or wheeze.  Gastro Intestinal  No nausea, vomitting, or diarrhoea. No bright red blood per rectum, no abdominal pain, change in bowel movement, or constipation.  Genito Urinary  No frequency, urgency, dysuria Musculo Skeletal  No myalgia, arthralgia, joint swelling or pain  Neurologic  No weakness, numbness, change in gait,  Psychology  No depression, anxiety, insomnia.   Vitals:  Blood pressure 131/72, pulse 93, temperature 98.6 F (37 C), temperature source Oral, resp. rate 20, height $RemoveBe'5\' 8"'[id number]$  (1.727 m), weight 240 lb 4.8 oz (108.999 kg).  Physical Exam: WD in NAD Neck  Supple NROM, without any enlargements.  Lymph Node Survey No cervical supraclavicular or inguinal adenopathy Cardiovascular  Pulse normal rate, regularity and rhythm. S1 and S2 normal.  Lungs  Clear to auscultation bilateraly, without wheezes/crackles/rhonchi. Good  air movement.  Skin  No rash/lesions/breakdown  Psychiatry  Alert and oriented to person, place, and time  Abdomen  Normoactive bowel sounds, abdomen soft, non-tender and obese without evidence of hernia. Well healed cholecystectomy and c section incision. Back No CVA tenderness Genito Urinary  Vulva/vagina: Normal external female genitalia.  No lesions. No discharge or bleeding.  Bladder/urethra:  No lesions or masses, well supported bladder  Vagina: normal  Cervix: Normal appearing, no lesions.  Uterus: Small, mobile, no parametrial involvement or nodularity.  Adnexa: no palpable masses. Rectal  Good tone, no masses no cul de sac nodularity.  Extremities  No bilateral cyanosis, clubbing or edema.   Donaciano Eva, MD   11/12/2013, 4:53 PM

## 2013-11-13 ENCOUNTER — Other Ambulatory Visit (INDEPENDENT_AMBULATORY_CARE_PROVIDER_SITE_OTHER): Payer: Self-pay

## 2013-11-13 ENCOUNTER — Telehealth: Payer: Self-pay | Admitting: *Deleted

## 2013-11-13 DIAGNOSIS — Z452 Encounter for adjustment and management of vascular access device: Secondary | ICD-10-CM

## 2013-11-13 MED ORDER — OXYCODONE-ACETAMINOPHEN 5-325 MG PO TABS: 1.0000 | ORAL_TABLET | ORAL | Status: DC | PRN

## 2013-11-13 NOTE — Telephone Encounter (Signed)
Received call from patient asking,"do I need to stop Arimidex before surgery 02/02/14 with Dr. Denman George?" Per Dr. Jana Hakim, I informed patient that she doesn't need to stop Arimidex. Patient verbalized understanding.

## 2013-11-16 ENCOUNTER — Telehealth: Payer: Self-pay | Admitting: *Deleted

## 2013-11-16 LAB — CYTOLOGY - PAP

## 2013-11-16 NOTE — Telephone Encounter (Signed)
Called pt notifed pap was normal.The HPV test continues to be positive, however it is not causing abnormalities in the cervix. Pt verbalized understanding. No further concerns.

## 2013-11-16 NOTE — Telephone Encounter (Signed)
Message copied by Lucile Crater on Mon Nov 16, 2013  4:55 PM ------      Message from: Everitt Amber C      Created: Mon Nov 16, 2013  4:16 PM       Stanton Kidney,      Would you mind letting Loriann know that her pap was normal. The HPV test continues to be positive, however it is not causing abnormalities in the cervix.      Donaciano Eva, MD       ------

## 2013-11-17 ENCOUNTER — Ambulatory Visit
Admission: RE | Admit: 2013-11-17 | Discharge: 2013-11-17 | Disposition: A | Discharge status: Home / Self Care | Payer: BC Managed Care – PPO | Source: Ambulatory Visit | Attending: Oncology | Admitting: Oncology

## 2013-11-17 ENCOUNTER — Ambulatory Visit (HOSPITAL_BASED_OUTPATIENT_CLINIC_OR_DEPARTMENT_OTHER): Payer: BC Managed Care – PPO

## 2013-11-17 VITALS — BP 136/90 | HR 86 | Temp 98.0°F

## 2013-11-17 DIAGNOSIS — C50319 Malignant neoplasm of lower-inner quadrant of unspecified female breast: Secondary | ICD-10-CM

## 2013-11-17 DIAGNOSIS — C50312 Malignant neoplasm of lower-inner quadrant of left female breast: Secondary | ICD-10-CM

## 2013-11-17 DIAGNOSIS — Z5111 Encounter for antineoplastic chemotherapy: Secondary | ICD-10-CM

## 2013-11-17 MED ORDER — GOSERELIN ACETATE 3.6 MG ~~LOC~~ IMPL
3.6000 mg | DRUG_IMPLANT | Freq: Once | SUBCUTANEOUS | Status: AC
  Administered 2013-11-17: 3.6 mg via SUBCUTANEOUS
  Filled 2013-11-17: qty 3.6

## 2013-11-25 ENCOUNTER — Telehealth: Payer: Self-pay | Admitting: *Deleted

## 2013-11-25 NOTE — Telephone Encounter (Signed)
Pt called request to move surgery date from Nov 17 to Nov 10. Case reposted for Nov 10. F/u with Dr Denman George 10/29

## 2013-12-15 ENCOUNTER — Ambulatory Visit (HOSPITAL_BASED_OUTPATIENT_CLINIC_OR_DEPARTMENT_OTHER): Payer: BC Managed Care – PPO

## 2013-12-15 VITALS — BP 143/91 | HR 76 | Temp 98.2°F

## 2013-12-15 DIAGNOSIS — C50319 Malignant neoplasm of lower-inner quadrant of unspecified female breast: Secondary | ICD-10-CM

## 2013-12-15 DIAGNOSIS — Z5111 Encounter for antineoplastic chemotherapy: Secondary | ICD-10-CM

## 2013-12-15 MED ORDER — GOSERELIN ACETATE 3.6 MG ~~LOC~~ IMPL
3.6000 mg | DRUG_IMPLANT | Freq: Once | SUBCUTANEOUS | Status: AC
  Administered 2013-12-15: 3.6 mg via SUBCUTANEOUS
  Filled 2013-12-15: qty 3.6

## 2013-12-28 ENCOUNTER — Other Ambulatory Visit: Payer: Self-pay | Admitting: Adult Health

## 2013-12-29 ENCOUNTER — Other Ambulatory Visit: Payer: Self-pay | Admitting: Adult Health

## 2013-12-29 DIAGNOSIS — C50312 Malignant neoplasm of lower-inner quadrant of left female breast: Secondary | ICD-10-CM

## 2014-01-12 ENCOUNTER — Other Ambulatory Visit: Payer: Self-pay | Admitting: *Deleted

## 2014-01-12 ENCOUNTER — Telehealth: Payer: Self-pay | Admitting: Adult Health

## 2014-01-12 ENCOUNTER — Ambulatory Visit (HOSPITAL_BASED_OUTPATIENT_CLINIC_OR_DEPARTMENT_OTHER): Payer: BC Managed Care – PPO | Admitting: Adult Health

## 2014-01-12 ENCOUNTER — Other Ambulatory Visit (HOSPITAL_BASED_OUTPATIENT_CLINIC_OR_DEPARTMENT_OTHER): Payer: BC Managed Care – PPO

## 2014-01-12 ENCOUNTER — Ambulatory Visit (HOSPITAL_BASED_OUTPATIENT_CLINIC_OR_DEPARTMENT_OTHER): Payer: BC Managed Care – PPO

## 2014-01-12 ENCOUNTER — Encounter: Payer: Self-pay | Admitting: Adult Health

## 2014-01-12 VITALS — BP 147/85 | HR 80 | Temp 98.2°F | Resp 18 | Ht 68.0 in | Wt 240.2 lb

## 2014-01-12 DIAGNOSIS — C50312 Malignant neoplasm of lower-inner quadrant of left female breast: Secondary | ICD-10-CM

## 2014-01-12 DIAGNOSIS — C50319 Malignant neoplasm of lower-inner quadrant of unspecified female breast: Secondary | ICD-10-CM

## 2014-01-12 DIAGNOSIS — M25539 Pain in unspecified wrist: Secondary | ICD-10-CM

## 2014-01-12 DIAGNOSIS — E559 Vitamin D deficiency, unspecified: Secondary | ICD-10-CM

## 2014-01-12 DIAGNOSIS — Z86718 Personal history of other venous thrombosis and embolism: Secondary | ICD-10-CM

## 2014-01-12 DIAGNOSIS — Z170 Estrogen receptor positive status [ER+]: Secondary | ICD-10-CM

## 2014-01-12 DIAGNOSIS — Z5111 Encounter for antineoplastic chemotherapy: Secondary | ICD-10-CM

## 2014-01-12 LAB — COMPREHENSIVE METABOLIC PANEL (CC13)
ALT: 27 U/L (ref 0–55)
AST: 17 U/L (ref 5–34)
Albumin: 3.6 g/dL (ref 3.5–5.0)
Alkaline Phosphatase: 106 U/L (ref 40–150)
Anion Gap: 9 mEq/L (ref 3–11)
BUN: 10.9 mg/dL (ref 7.0–26.0)
CO2: 27 mEq/L (ref 22–29)
Calcium: 9.6 mg/dL (ref 8.4–10.4)
Chloride: 105 mEq/L (ref 98–109)
Creatinine: 0.7 mg/dL (ref 0.6–1.1)
GLUCOSE: 128 mg/dL (ref 70–140)
Potassium: 3.6 mEq/L (ref 3.5–5.1)
Sodium: 141 mEq/L (ref 136–145)
Total Bilirubin: 0.84 mg/dL (ref 0.20–1.20)
Total Protein: 6.7 g/dL (ref 6.4–8.3)

## 2014-01-12 LAB — CBC WITH DIFFERENTIAL/PLATELET
BASO%: 1.5 % (ref 0.0–2.0)
BASOS ABS: 0.1 10*3/uL (ref 0.0–0.1)
EOS%: 2.3 % (ref 0.0–7.0)
Eosinophils Absolute: 0.2 10*3/uL (ref 0.0–0.5)
HCT: 41.1 % (ref 34.8–46.6)
HGB: 14 g/dL (ref 11.6–15.9)
LYMPH%: 26 % (ref 14.0–49.7)
MCH: 31.2 pg (ref 25.1–34.0)
MCHC: 34.1 g/dL (ref 31.5–36.0)
MCV: 91.3 fL (ref 79.5–101.0)
MONO#: 0.5 10*3/uL (ref 0.1–0.9)
MONO%: 5.7 % (ref 0.0–14.0)
NEUT#: 5.2 10*3/uL (ref 1.5–6.5)
NEUT%: 64.5 % (ref 38.4–76.8)
PLATELETS: 257 10*3/uL (ref 145–400)
RBC: 4.5 10*6/uL (ref 3.70–5.45)
RDW: 13.8 % (ref 11.2–14.5)
WBC: 8 10*3/uL (ref 3.9–10.3)
lymph#: 2.1 10*3/uL (ref 0.9–3.3)

## 2014-01-12 MED ORDER — GOSERELIN ACETATE 3.6 MG ~~LOC~~ IMPL
3.6000 mg | DRUG_IMPLANT | Freq: Once | SUBCUTANEOUS | Status: AC
  Administered 2014-01-12: 3.6 mg via SUBCUTANEOUS
  Filled 2014-01-12: qty 3.6

## 2014-01-12 MED ORDER — LORAZEPAM 0.5 MG PO TABS: 0.5000 mg | ORAL_TABLET | Freq: Every evening | ORAL | Status: DC | PRN

## 2014-01-12 MED ORDER — ALPRAZOLAM 0.25 MG PO TABS: 0.2500 mg | ORAL_TABLET | Freq: Every evening | ORAL | Status: DC | PRN

## 2014-01-12 NOTE — Telephone Encounter (Signed)
, °

## 2014-01-12 NOTE — Progress Notes (Signed)
ID: [name] OB: 1976-01-08  MR#: [id number]  SAY#:[id number]  PCP: [name] GYN:   SU: Dr. Autumn Messing OTHER MD: Dr. 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 An ultrasound on 10/06/2012 showed a 1.6 x 1.3 x 1.3 cm mass at the 8 o'clock position 7 cm from the nipple. At the 12 o'clock position behind and nipple was a circumscribed oval shaped 2 x 1.8 x 1.2 cm lesion perhaps representing a fibroadenoma. 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 The carcinoma was grade 2-3, estrogen receptor 80% positive, progesterone receptor 80% positive, Ki-67 25%, and HER-2/neu negative. Bilateral breast MRI on 10/22/2012 showed biopsy proven carcinoma in the left lower inner quadrant identified and measures approximately 1.9 x 1.9 x 1.8 cm. Well-defined nodule in the right upper outer quadrant posteriorly consistent with a fibroadenoma. Probable liver cysts.There were multiple scattered nonspecific foci in each breast. No other suspicious abnormality was identified. No abnormal appearing lymph nodes.   The patient's subsequent history is as detailed below  INTERVAL HISTORY:  [name] is doing well today.  She is here for f/u of her ER positive stage II breast cancer.  She is taking Arimidex daily.  She has been receiving monthly goserelin and tolerating it well.  She is having salpingo oopherectomy on 01/26/14.  She is tolerating the Arimidex moderately well.  She does have some wrist discomfort.  This is not constant, but has slightly worsened.  It does wake her up from being asleep.  She hasn't taken  anything to help it yet, because she wanted to talk to [place].  Otherwise, she is well and without concerns.    REVIEW OF SYSTEMS: A 10 point review of systems was conducted and is otherwise negative except for what is noted above.     PAST MEDICAL HISTORY: Past Medical History  Diagnosis Date  . Urinary incontinence     occasional  . Family history of anesthesia complication     pt's mother has hx. of post-op N/V  . Hypertension     under control with med., has been on med. since 09/2012  . Anxiety   . Breast cancer 10/16/12    left, lower medial, 8 o'clock  . History of radiation therapy 07/06/13-08/20/13    left breast/    PAST SURGICAL HISTORY: Past Surgical History  Procedure Laterality Date  . Cesarean section  1999  . Breast lumpectomy with needle localization and axillary sentinel lymph node bx Left 12/10/2012    Procedure: BREAST LUMPECTOMY WITH NEEDLE LOCALIZATION AND AXILLARY SENTINEL LYMPH NODE BX;  Surgeon: Merrie Roof, MD;  Location: [facility];  Service: General;  Laterality: Left;  Needle loc BCG 7:30  nuc med 9:30    . Tonsillectomy and adenoidectomy      as a teenager  . Cholecystectomy  1999  . Open reduction internal fixation (orif) tibia/fibula fracture Right   . Portacath placement Right 01/09/2013    Procedure: INSERTION PORT-A-CATH;  Surgeon: Merrie Roof, MD;  Location: [facility];  Service: General;  Laterality: Right;    FAMILY HISTORY Family History  Problem Relation Age of Onset  .  Breast cancer Maternal Grandmother   . Lung cancer Paternal Grandmother   . 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 She is divorced. She lives with her daughter [name] who is 8 years old as of August of 2015.    ADVANCED DIRECTIVES: (Updated 01/12/2014) Not in place. At her 10/20/2013 visit the patient was given the appropriate documents to complete and notarize at her discretion. She tells me she intends to name her mother, [name], as healthcare power of attorney. She can be reached at [phone]   HEALTH MAINTENANCE: History  Substance Use Topics  . Smoking status: Former Smoker    Quit date: 12/03/2012  . Smokeless tobacco: Never Used  . Alcohol Use: Yes     Comment: occasionally    Colonoscopy: n/a Pap Smear: 03/2013 Eye Exam: 08/2012 Vitamin D Level: pending Lipid Panel: not  Checked recently   Allergies  Allergen Reactions  . Amoxicillin Other (See Comments)    States "it does not work"    Current Outpatient Prescriptions  Medication Sig Dispense Refill  . anastrozole (ARIMIDEX) 1 MG tablet Take 1 tablet (1 mg total) by mouth daily.  30 tablet  6  . lisinopril (PRINIVIL,ZESTRIL) 10 MG tablet TAKE 1 TABLET (10 MG TOTAL) BY MOUTH DAILY.  30 tablet  3  . ALPRAZolam (XANAX) 0.25 MG tablet Take 1 tablet (0.25 mg total) by mouth at bedtime as needed for sleep.  30 tablet  0  . LORazepam (ATIVAN) 0.5 MG tablet Take 1 tablet (0.5 mg total) by mouth at bedtime as needed (Nausea or vomiting).  30 tablet  0  . oxyCODONE-acetaminophen (ROXICET) 5-325 MG per tablet Take 1-2 tablets by mouth every 4 (four) hours as needed.  30 tablet  0   No current facility-administered medications for this visit.    OBJECTIVE: Young white female in no acute distress Filed Vitals:   01/12/14 1322  BP: 147/85  Pulse: 80  Temp: 98.2 F (36.8 C)  Resp: 18     Body mass index is 36.53 kg/(m^2).     GENERAL: Patient is a well appearing female in no acute distress HEENT:  Sclerae anicteric.  Oropharynx clear and moist. No ulcerations  or evidence of oropharyngeal candidiasis. Neck is supple.  NODES:  No cervical, supraclavicular, or axillary lymphadenopathy palpated.  BREAST EXAM: left breast s/p lumpectomy and radiation.  Radiation thickening noted, no masses, right breast no nodules or masses.  Benign bilateral breast exam. LUNGS:  Clear to auscultation bilaterally.  No wheezes or rhonchi. HEART:  Regular rate and rhythm. No murmur appreciated. ABDOMEN:  Soft, nontender.  Positive, normoactive bowel sounds. No organomegaly palpated. MSK:  No focal spinal tenderness to palpation. Full range of motion bilaterally in the upper extremities. EXTREMITIES:  No peripheral edema.   SKIN:  Clear with no obvious rashes or skin changes. No nail dyscrasia. NEURO:  Nonfocal. Well oriented.  Appropriate affect. ECOG FS:1 - Symptomatic but completely ambulatory  LAB RESULTS:  CMP     Component Value Date/Time   NA 141 01/12/2014 1232   NA 136 01/23/2013 1413   K 3.6 01/12/2014  1232   K 3.7 01/23/2013 1413   CL 102 01/23/2013 1413   CO2 27 01/12/2014 1232   CO2 27 01/23/2013 1413   GLUCOSE 128 01/12/2014 1232   GLUCOSE 112* 01/23/2013 1413   BUN 10.9 01/12/2014 1232   BUN 12 01/23/2013 1413   CREATININE 0.7 01/12/2014 1232   CREATININE 0.68 01/23/2013 1413   CALCIUM 9.6 01/12/2014 1232   CALCIUM 9.3 01/23/2013 1413   PROT 6.7 01/12/2014 1232   PROT 6.5 01/23/2013 1413   ALBUMIN 3.6 01/12/2014 1232   ALBUMIN 3.2* 01/23/2013 1413   AST 17 01/12/2014 1232   AST 27 01/23/2013 1413   ALT 27 01/12/2014 1232   ALT 30 01/23/2013 1413   ALKPHOS 106 01/12/2014 1232   ALKPHOS 74 01/23/2013 1413   BILITOT 0.84 01/12/2014 1232   BILITOT 0.5 01/23/2013 1413   GFRNONAA >90 12/03/2012 1409   GFRAA >90 12/03/2012 1409    I No results found for this basename: SPEP,  UPEP,   kappa and lambda light chains    Lab Results  Component Value Date   WBC 8.0 01/12/2014   NEUTROABS 5.2 01/12/2014   HGB 14.0 01/12/2014   HCT 41.1 01/12/2014   MCV  91.3 01/12/2014   PLT 257 01/12/2014      Chemistry      Component Value Date/Time   NA 141 01/12/2014 1232   NA 136 01/23/2013 1413   K 3.6 01/12/2014 1232   K 3.7 01/23/2013 1413   CL 102 01/23/2013 1413   CO2 27 01/12/2014 1232   CO2 27 01/23/2013 1413   BUN 10.9 01/12/2014 1232   BUN 12 01/23/2013 1413   CREATININE 0.7 01/12/2014 1232   CREATININE 0.68 01/23/2013 1413      Component Value Date/Time   CALCIUM 9.6 01/12/2014 1232   CALCIUM 9.3 01/23/2013 1413   ALKPHOS 106 01/12/2014 1232   ALKPHOS 74 01/23/2013 1413   AST 17 01/12/2014 1232   AST 27 01/23/2013 1413   ALT 27 01/12/2014 1232   ALT 30 01/23/2013 1413   BILITOT 0.84 01/12/2014 1232   BILITOT 0.5 01/23/2013 1413       No results found for this basename: LABCA2    No components found with this basename: ZYSAY301    No results found for this basename: INR,  in the last 168 hours  Urinalysis    Component Value Date/Time   LABSPEC 1.010 07/16/2013 1538   GLUCOSEU Negative 07/16/2013 1538   UROBILINOGEN 0.2 07/16/2013 1538    STUDIES: CLINICAL DATA: Taking antiestrogen medication for breast cancer.  Premenopausal.  EXAM:  DUAL X-RAY ABSORPTIOMETRY (DXA) FOR BONE MINERAL DENSITY  FINDINGS:  AP LUMBAR SPINE  Bone Mineral Density (BMD): 0.972 g/cm2  Z-Score: -0.5  LEFT FEMUR NECK  Bone Mineral Density (BMD): 0.775 g/cm2  Z-Score: -0.4  ASSESSMENT: The patient's Z-score is within expected range for age  FRACTURE RISK: Not increased  FRAX: World Health Organization FRAX assessment of absolute fracture  risk is not calculated for this patient because of premenopausal  status.  COMPARISON: None.   ASSESSMENT: 38 y.o. BRCA negative [place] woman  status post left breast lumpectomy with left axillary sentinel lymph node biopsy 12/10/2012 for a pT2, pN13mi, stage IIB invasive ductal carcinoma, grade 3, estrogen receptor 80% positive, progesterone receptor 80% positive, Ki-67 25%, HER-2/neu  negative  #1 Genetic testing performed in [place], [place] with self reported by the patient as BRCA1 and BRCA2 negative.   #2 s/p adjuvant chemotherapy  consisting of FEC (5-FU/epirubicin/Cytoxan) begun on 01/23/2013,completed on 04/03/13 (six cycles).   #3 S/p adjuvant weekly Taxol beginning 04/17/13, received  7 of the 12 planned cycles, last dose 06/05/13 (stopped secondary to skin toxicity)  #6 adjuvant radiation to the left breast and regional lymph nodes completed 08/20/2013  #7 goserelin started 04/17/2013, continued monthly  #8 anastrozole started 08/14/2013; bone density 08/19/2013 was normal  #6 Right lower extremity DVT diagnosed on 03/16/2013, patient was placed on Xarelto. Repeat venous Doppler performed in March 2015 was negative for any thrombosis.  Xarelto was stopped Aug 14, 2012.    PLAN:   [name] is doing well today.  She has no sign of recurrence.  She will continue to take Arimidex daily.  She is having wrist pain and I recommended Aleve a couple of times a day if needed.  I did tell her that up until her surgery she should only take tylenol.  She verbalized understanding.  She will proceed with Goserelin today.  This will be her last injection.  She will f/u with Dr. Denman George on 10/29 for pre-op for bilateral salpingo-oopherectomy.    Dashawn will return in 3 months for labs and evaluation.    Ajanae has a good understanding of the overall plan. She agrees with it. She knows the goal of treatment in her case is cure. She will call with any problems that may develop before her next visit here.  I spent 25 minutes counseling the patient face to face.  The total time spent in the appointment was 30 minutes.   Minette Headland, [place] [phone] 01/12/2014 2:55 PM

## 2014-01-12 NOTE — Patient Instructions (Signed)
You are doing well.  You have no sign of recurrence.  Continue taking Arimidex every day.  Take Aleve for the wrist pain.  If it continues call me and I will order xrays.  I recommend healthy diet, exercise and monthly breast exams.    Breast Self-Awareness Practicing breast self-awareness may pick up problems early, prevent significant medical complications, and possibly save your life. By practicing breast self-awareness, you can become familiar with how your breasts look and feel and if your breasts are changing. This allows you to notice changes early. It can also offer you some reassurance that your breast health is good. One way to learn what is normal for your breasts and whether your breasts are changing is to do a breast self-exam. If you find a lump or something that was not present in the past, it is best to contact your caregiver right away. Other findings that should be evaluated by your caregiver include nipple discharge, especially if it is bloody; skin changes or reddening; areas where the skin seems to be pulled in (retracted); or new lumps and bumps. Breast pain is seldom associated with cancer (malignancy), but should also be evaluated by a caregiver. HOW TO PERFORM A BREAST SELF-EXAM The best time to examine your breasts is 5-7 days after your menstrual period is over. During menstruation, the breasts are lumpier, and it may be more difficult to pick up changes. If you do not menstruate, have reached menopause, or had your uterus removed (hysterectomy), you should examine your breasts at regular intervals, such as monthly. If you are breastfeeding, examine your breasts after a feeding or after using a breast pump. Breast implants do not decrease the risk for lumps or tumors, so continue to perform breast self-exams as recommended. Talk to your caregiver about how to determine the difference between the implant and breast tissue. Also, talk about the amount of pressure you should use during  the exam. Over time, you will become more familiar with the variations of your breasts and more comfortable with the exam. A breast self-exam requires you to remove all your clothes above the waist. 1. Look at your breasts and nipples. Stand in front of a mirror in a room with good lighting. With your hands on your hips, push your hands firmly downward. Look for a difference in shape, contour, and size from one breast to the other (asymmetry). Asymmetry includes puckers, dips, or bumps. Also, look for skin changes, such as reddened or scaly areas on the breasts. Look for nipple changes, such as discharge, dimpling, repositioning, or redness. 2. Carefully feel your breasts. This is best done either in the shower or tub while using soapy water or when flat on your back. Place the arm (on the side of the breast you are examining) above your head. Use the pads (not the fingertips) of your three middle fingers on your opposite hand to feel your breasts. Start in the underarm area and use  inch (2 cm) overlapping circles to feel your breast. Use 3 different levels of pressure (light, medium, and firm pressure) at each circle before moving to the next circle. The light pressure is needed to feel the tissue closest to the skin. The medium pressure will help to feel breast tissue a little deeper, while the firm pressure is needed to feel the tissue close to the ribs. Continue the overlapping circles, moving downward over the breast until you feel your ribs below your breast. Then, move one finger-width towards the  center of the body. Continue to use the  inch (2 cm) overlapping circles to feel your breast as you move slowly up toward the collar bone (clavicle) near the base of the neck. Continue the up and down exam using all 3 pressures until you reach the middle of the chest. Do this with each breast, carefully feeling for lumps or changes. 3.  Keep a written record with breast changes or normal findings for each  breast. By writing this information down, you do not need to depend only on memory for size, tenderness, or location. Write down where you are in your menstrual cycle, if you are still menstruating. Breast tissue can have some lumps or thick tissue. However, see your caregiver if you find anything that concerns you.  SEEK MEDICAL CARE IF:  You see a change in shape, contour, or size of your breasts or nipples.   You see skin changes, such as reddened or scaly areas on the breasts or nipples.   You have an unusual discharge from your nipples.   You feel a new lump or unusually thick areas.  Document Released: 03/05/2005 Document Revised: 02/20/2012 Document Reviewed: 06/20/2011 [employer] Patient Information 2015 [place], [place]. This information is not intended to replace advice given to you by your health care provider. Make sure you discuss any questions you have with your health care provider.

## 2014-01-12 NOTE — Patient Instructions (Signed)
Goserelin injection What is this medicine? GOSERELIN (GOE se rel in) is similar to a hormone found in the body. It lowers the amount of sex hormones that the body makes. Men will have lower testosterone levels and women will have lower estrogen levels while taking this medicine. In men, this medicine is used to treat prostate cancer; the injection is either given once per month or once every 12 weeks. A once per month injection (only) is used to treat women with endometriosis, dysfunctional uterine bleeding, or advanced breast cancer. This medicine may be used for other purposes; ask your health care provider or pharmacist if you have questions. COMMON BRAND NAME(S): Zoladex What should I tell my health care provider before I take this medicine? They need to know if you have any of these conditions (some only apply to women): -diabetes -heart disease or previous heart attack -high blood pressure -high cholesterol -kidney disease -osteoporosis or low bone density -problems passing urine -spinal cord injury -stroke -tobacco smoker -an unusual or allergic reaction to goserelin, hormone therapy, other medicines, foods, dyes, or preservatives -pregnant or trying to get pregnant -breast-feeding How should I use this medicine? This medicine is for injection under the skin. It is given by a health care professional in a hospital or clinic setting. Men receive this injection once every 4 weeks or once every 12 weeks. Women will only receive the once every 4 weeks injection. Talk to your pediatrician regarding the use of this medicine in children. Special care may be needed. Overdosage: If you think you have taken too much of this medicine contact a poison control center or emergency room at once. NOTE: This medicine is only for you. Do not share this medicine with others. What if I miss a dose? It is important not to miss your dose. Call your doctor or health care professional if you are unable to  keep an appointment. What may interact with this medicine? -female hormones like estrogen -herbal or dietary supplements like black cohosh, chasteberry, or DHEA -female hormones like testosterone -prasterone This list may not describe all possible interactions. Give your health care provider a list of all the medicines, herbs, non-prescription drugs, or dietary supplements you use. Also tell them if you smoke, drink alcohol, or use illegal drugs. Some items may interact with your medicine. What should I watch for while using this medicine? Visit your doctor or health care professional for regular checks on your progress. Your symptoms may appear to get worse during the first weeks of this therapy. Tell your doctor or healthcare professional if your symptoms do not start to get better or if they get worse after this time. Your bones may get weaker if you take this medicine for a long time. If you smoke or frequently drink alcohol you may increase your risk of bone loss. A family history of osteoporosis, chronic use of drugs for seizures (convulsions), or corticosteroids can also increase your risk of bone loss. Talk to your doctor about how to keep your bones strong. This medicine should stop regular monthly menstration in women. Tell your doctor if you continue to menstrate. Women should not become pregnant while taking this medicine or for 12 weeks after stopping this medicine. Women should inform their doctor if they wish to become pregnant or think they might be pregnant. There is a potential for serious side effects to an unborn child. Talk to your health care professional or pharmacist for more information. Do not breast-feed an infant while taking   this medicine. Men should inform their doctors if they wish to father a child. This medicine may lower sperm counts. Talk to your health care professional or pharmacist for more information. What side effects may I notice from receiving this  medicine? Side effects that you should report to your doctor or health care professional as soon as possible: -allergic reactions like skin rash, itching or hives, swelling of the face, lips, or tongue -bone pain -breathing problems -changes in vision -chest pain -feeling faint or lightheaded, falls -fever, chills -pain, swelling, warmth in the leg -pain, tingling, numbness in the hands or feet -signs and symptoms of low blood pressure like dizziness; feeling faint or lightheaded, falls; unusually weak or tired -stomach pain -swelling of the ankles, feet, hands -trouble passing urine or change in the amount of urine -unusually high or low blood pressure -unusually weak or tired Side effects that usually do not require medical attention (report to your doctor or health care professional if they continue or are bothersome): -change in sex drive or performance -changes in breast size in both males and females -changes in emotions or moods -headache -hot flashes -irritation at site where injected -loss of appetite -skin problems like acne, dry skin -vaginal dryness This list may not describe all possible side effects. Call your doctor for medical advice about side effects. You may report side effects to FDA at 1-800-FDA-1088. Where should I keep my medicine? This drug is given in a hospital or clinic and will not be stored at home. NOTE: This sheet is a summary. It may not cover all possible information. If you have questions about this medicine, talk to your doctor, pharmacist, or health care provider.  2015, [employer]vier/[employer] Standard. (2013-05-12 11:10:35)  

## 2014-01-13 LAB — VITAMIN D 25 HYDROXY (VIT D DEFICIENCY, FRACTURES): VIT D 25 HYDROXY: 49 ng/mL (ref 30–89)

## 2014-01-14 ENCOUNTER — Ambulatory Visit: Payer: BC Managed Care – PPO | Attending: Gynecologic Oncology | Admitting: Gynecologic Oncology

## 2014-01-14 ENCOUNTER — Encounter: Payer: Self-pay | Admitting: Gynecologic Oncology

## 2014-01-14 VITALS — BP 143/86 | HR 91 | Temp 98.3°F | Resp 20 | Ht 68.0 in | Wt 243.8 lb

## 2014-01-14 DIAGNOSIS — C50312 Malignant neoplasm of lower-inner quadrant of left female breast: Secondary | ICD-10-CM

## 2014-01-14 DIAGNOSIS — Z170 Estrogen receptor positive status [ER+]: Secondary | ICD-10-CM | POA: Diagnosis not present

## 2014-01-14 DIAGNOSIS — R87810 Cervical high risk human papillomavirus (HPV) DNA test positive: Secondary | ICD-10-CM | POA: Diagnosis not present

## 2014-01-14 DIAGNOSIS — Z87891 Personal history of nicotine dependence: Secondary | ICD-10-CM | POA: Insufficient documentation

## 2014-01-14 DIAGNOSIS — Z803 Family history of malignant neoplasm of breast: Secondary | ICD-10-CM | POA: Insufficient documentation

## 2014-01-14 DIAGNOSIS — C50919 Malignant neoplasm of unspecified site of unspecified female breast: Secondary | ICD-10-CM | POA: Diagnosis not present

## 2014-01-14 NOTE — Progress Notes (Signed)
Consult Note: Gyn-Onc  Consult was requested by Dr. Magrinat for the evaluation of [name] 38 y.o. female with estrogen receptor positive breast cancer desiring surgical menopause for therapeutic indication.  CC:  Chief Complaint  Patient presents with  . preop surgery    breast cancer, desires BSO  . positive for HRHPV    Assessment/Plan:  [name]  is a 38 y.o.  year old who was initially seen in consultation at the request of Dr. Magrinat for evaluation for therapeutic surgical menopause as part of the treatment of premenopausal estrogen, progesterone receptor positive stage II breast cancer. She is scheduled for a robotic BSO on November 10th with Dr Paola Gehrig.   She had positive HRHPV subtypes on her screening pap (normal cytology) from August, 2015 and received colposcopy and biopsies today per recommendations from the ATHENA trial. I see no apparent CIN3 or invasive cervical process on colposcopic evaluation.  Overall [name] is a good surgical candidate. 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 I discussed that she would be a candidate for her a laparoscopic approach with robotic assistance with bilateral salpingo-oophorectomy, as part of an outpatient procedure. We discussed operative risks including  bleeding, infection, damage to internal organs (such as bladder,ureters, bowels), blood clot, reoperation and rehospitalization.  We will recommend Lovenox postop for prophylaxis for 2-4 weeks given her prior history of DVT and increased risks for recurrent VTE around the time of pelvic surgeries.  I will followup today's biopsy results and follow ASCCP guidelines regarding further workup and intervention if results are positive.  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 She underwent a course of therapy with left breast lumpectomy and left axillary sentinel lymph node biopsy, adjuvant chemotherapy with 5-FU, epirubicin, and Cytoxan, weekly Taxol, adjuvant radiation to the left breast and regional lymph nodes, goserelin and anastrozole. 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 She received Xarelto therapy until May 2014 at which time it was discontinued when ultrasound confirmed no visualized thrombosis. She is experiencing significant dissatisfaction with her monthly goserelin injection and desires definitive surgical menopause.  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 She is no other history of abnormal Pap smears, or cervical procedures. She has been amenorrheic since September of 2014 during chemotherapy treatment (including no resumption of menses after completion of chemotherapy). Repeat pap on 8/15 showed normal cytology and positive HRHPV.   Interval History: She continues to be amenorrheic. Last dose of goserelin was 10/26.    Current Meds:  Outpatient Encounter Prescriptions as of 01/14/2014  Medication Sig  . ALPRAZolam (XANAX) 0.25 MG tablet Take 1 tablet (0.25 mg total) by mouth at bedtime as needed for sleep.  . anastrozole (ARIMIDEX) 1 MG tablet Take 1 tablet (1 mg total) by mouth daily.  . lisinopril (PRINIVIL,ZESTRIL) 10 MG tablet TAKE 1 TABLET (10 MG TOTAL) BY MOUTH DAILY.  . LORazepam (ATIVAN) 0.5 MG tablet Take 1 tablet (0.5 mg total) by mouth at bedtime as needed (Nausea or vomiting).  . oxyCODONE-acetaminophen (ROXICET) 5-325 MG per tablet Take 1-2 tablets   by mouth every 4 (four) hours as needed.    Allergy:  Allergies  Allergen Reactions  . Amoxicillin Other (See Comments)     States "it does not work"    Social Hx:   History   Social History  . Marital Status: Divorced    Spouse Name: N/A    Number of Children: N/A  . Years of Education: N/A   Occupational History  . Not on file.   Social History Main Topics  . Smoking status: Former Smoker    Quit date: 12/03/2012  . Smokeless tobacco: Never Used  . Alcohol Use: Yes     Comment: occasionally  . Drug Use: No  . Sexual Activity: Yes    Birth Control/ Protection: Condom     Comment: menarche age 12, P1 @ age 22,  last menstrual cycle 09/29/12   Other Topics Concern  . Not on file   Social History Narrative  . No narrative on file    Past Surgical Hx:  Past Surgical History  Procedure Laterality Date  . Cesarean section  1999  . Breast lumpectomy with needle localization and axillary sentinel lymph node bx Left 12/10/2012    Procedure: BREAST LUMPECTOMY WITH NEEDLE LOCALIZATION AND AXILLARY SENTINEL LYMPH NODE BX;  Surgeon: Paul S Toth III, MD;  Location: [facility] OR;  Service: General;  Laterality: Left;  Needle loc BCG 7:30  nuc med 9:30    . Tonsillectomy and adenoidectomy      as a teenager  . Cholecystectomy  1999  . Open reduction internal fixation (orif) tibia/fibula fracture Right   . Portacath placement Right 01/09/2013    Procedure: INSERTION PORT-A-CATH;  Surgeon: Paul S Toth III, MD;  Location: [facility];  Service: General;  Laterality: Right;    Past Medical Hx:  Past Medical History  Diagnosis Date  . Urinary incontinence     occasional  . Family history of anesthesia complication     pt's mother has hx. of post-op N/V  . Hypertension     under control with med., has been on med. since 09/2012  . Anxiety   . Breast cancer 10/16/12    left, lower medial, 8 o'clock  . History of radiation therapy 07/06/13-08/20/13    left breast/    Past Gynecological History:  G1 (cesarean), HRHPV positive on screening pap (normal cytology) in July 2014, positive HRHPV on  pap 8/15)  No LMP recorded. Patient is not currently having periods (Reason: Chemotherapy).  Family Hx:  Family History  Problem Relation Age of Onset  . Breast cancer Maternal Grandmother   . Lung cancer Paternal Grandmother   . Anesthesia problems Mother     post-op N/V    Review of Systems:  Constitutional  Feels well,    ENT Normal appearing ears and nares bilaterally Skin/Breast  No rash, sores, jaundice, itching, dryness Cardiovascular  No chest pain, shortness of breath, or edema  Pulmonary  No cough or wheeze.  Gastro Intestinal  No nausea, vomitting, or diarrhoea. No bright red blood per rectum, no abdominal pain, change in bowel movement, or constipation.  Genito Urinary  No frequency, urgency, dysuria Musculo Skeletal  No myalgia, arthralgia, joint swelling or pain  Neurologic  No weakness, numbness, change in gait,  Psychology  No depression, anxiety, insomnia.   Vitals:  Blood pressure 143/86, pulse 91, temperature 98.3 F (36.8 C), temperature source Oral, resp. rate 20, height 5' 8" (1.727 m), weight 243 lb 12.8 oz (110.587   kg).  Physical Exam: WD in NAD Neck  Supple NROM, without any enlargements.  Lymph Node Survey No cervical supraclavicular or inguinal adenopathy Cardiovascular  Pulse normal rate, regularity and rhythm. S1 and S2 normal.  Lungs  Clear to auscultation bilateraly, without wheezes/crackles/rhonchi. Good air movement.  Skin  No rash/lesions/breakdown  Psychiatry  Alert and oriented to person, place, and time  Abdomen  Normoactive bowel sounds, abdomen soft, non-tender and obese without evidence of hernia. Well healed cholecystectomy and c section incision. Back No CVA tenderness Genito Urinary  Vulva/vagina: Normal external female genitalia.  No lesions. No discharge or bleeding.  Bladder/urethra:  No lesions or masses, well supported bladder  Vagina: normal  Cervix: Normal appearing, no lesions.  Uterus: Small, mobile, no  parametrial involvement or nodularity.  Adnexa: no palpable masses. Rectal  Good tone, no masses no cul de sac nodularity.  Extremities  No bilateral cyanosis, clubbing or edema.  PROCEDURE: COLPOSCOPY 4% acetic acid was applied to the patient's cervix. The colposcope was used to magnify the view of the cervix. There were some areas of prominent vascularity seen on the posterior lip. These were biopsied. The entire TZ was visualized and felt to appear normal. An ECC was performed. Monsels was used for hemostasis at the biopsy site.   Kevonte Vanecek Caroline, MD   01/14/2014, 3:22 PM      

## 2014-01-14 NOTE — Patient Instructions (Signed)
Preparing for your Surgery  Plan for surgery on Nov 10 with Dr. Alycia Rossetti.  Pre-operative Testing -You will receive a phone call from presurgical testing at [facility] to arrange for a pre-operative testing appointment before your surgery.  This appointment normally occurs one to two weeks before your scheduled surgery.   -Bring your insurance card, copy of an advanced directive if applicable, medication list  -At that visit, you will be asked to sign a consent for a possible blood transfusion in case a transfusion becomes necessary during surgery.  The need for a blood transfusion is rare but having consent is a necessary part of your care.     -You should not be taking blood thinners or aspirin at least ten days prior to surgery unless instructed by your surgeon.  Day Before Surgery at [facility] will be asked to take in only clear liquids the day before surgery.  Examples of clear liquids include broths, jello, and clear juices.  You will be advised to have nothing to eat or drink after midnight the evening before.    Your role in recovery Your role is to become active as soon as directed by your doctor, while still giving yourself time to heal.  Rest when you feel tired. You will be asked to do the following in order to speed your recovery:  - Cough and breathe deeply. This helps toclear and expand your lungs and can prevent pneumonia. You may be given a spirometer to practice deep breathing. A staff member will show you how to use the spirometer. - Do mild physical activity. Walking or moving your legs help your circulation and body functions return to normal. A staff member will help you when you try to walk and will provide you with simple exercises. Do not try to get up or walk alone the first time. - Actively manage your pain. Managing your pain lets you move in comfort. We will ask you to rate your pain on a scale of zero to 10. It is your responsibility to tell  your doctor or nurse where and how much you hurt so your pain can be treated.  Special Considerations -If you are diabetic, you may be placed on insulin after surgery to have closer control over your blood sugars to promote healing and recovery.  This does not mean that you will be discharged on insulin.  If applicable, your oral antidiabetics will be resumed when you are tolerating a solid diet.  -Your final pathology results from surgery should be available by the Friday after surgery and the results will be relayed to you when available.

## 2014-01-15 ENCOUNTER — Encounter (HOSPITAL_COMMUNITY): Payer: Self-pay | Admitting: Pharmacy Technician

## 2014-01-20 NOTE — Patient Instructions (Addendum)
[name]  01/20/2014                           YOUR PROCEDURE IS SCHEDULED ON:  01/26/14                ENTER FROM [address] - GO TO [address]ARKING DECK               LOOK FOR [employer]  / [employer]                              FOLLOW  SIGNS TO SHORT STAY CENTER                 ARRIVE AT SHORT STAY AT: 6:00 AM               CALL THIS NUMBER IF ANY PROBLEMS THE DAY OF SURGERY :               [phone]                                REMEMBER:   Do not eat food or drink liquids AFTER MIDNIGHT              CLEAR LIQUIDS ONLY THE DAY BEFORE SURGERY     CLEAR LIQUID DIET   Foods Allowed                                                                     Foods Excluded  Coffee and tea, regular and decaf                             liquids that you cannot  Plain Jell-O in any flavor                                             see through such as: Fruit ices (not with fruit pulp)                                     milk, soups, orange juice  Iced Popsicles                                    All solid food Carbonated beverages, regular and diet                                    Cranberry, grape and apple juices Sports drinks like Gatorade Lightly seasoned clear broth or consume(fat free) Sugar, honey syrup  Sample Menu Breakfast  Lunch                                     Supper Cranberry juice                    Beef broth                            Chicken broth Jell-O                                     Grape juice                           Apple juice Coffee or tea                        Jell-O                                      Popsicle                                                Coffee or tea                        Coffee or tea  _____________________________________________________________________                    Take these medicines the morning of surgery with               A SIPS OF WATER :  XANAX IF NEEDED /  ARIMIDEX       Do not wear jewelry, make-up   Do not wear lotions, powders, or perfumes.   Do not shave legs or underarms 12 hrs. before surgery (men may shave face)  Do not bring valuables to the hospital.  Contacts, dentures or bridgework may not be worn into surgery.  Leave suitcase in the car. After surgery it may be brought to your room.  For patients admitted to the hospital more than one night, checkout time is            11:00 AM                                                       The day of discharge.   Patients discharged the day of surgery will not be allowed to drive home.            If going home same day of surgery, must have someone stay with you              FIRST 24 hrs at home and arrange for some one to drive you              home from hospital.   ________________________________________________________________________  [place]  Before surgery, you can play an important role.  Because skin is not sterile, your skin needs to be as free of germs as possible.  You can reduce the number of germs on your skin by washing with CHG (chlorahexidine gluconate) soap before surgery.  CHG is an antiseptic cleaner which kills germs and bonds with the skin to continue killing germs even after washing. Please DO NOT use if you have an allergy to CHG or antibacterial soaps.  If your skin becomes reddened/irritated stop using the CHG and inform your nurse when you arrive at Short Stay. Do not shave (including legs and underarms) for at least 48 hours prior to the first CHG shower.  You may shave your face. Please follow these instructions carefully:   1.  Shower with CHG Soap the night before surgery and the  morning of Surgery.   2.  If you choose to wash your hair, wash your hair first as usual with your  normal  Shampoo.   3.  After you shampoo, rinse  your hair and body thoroughly to remove the  shampoo.                                         4.  Use CHG as you would any other liquid soap.  You can apply chg directly  to the skin and wash . Gently wash with scrungie or clean wascloth    5.  Apply the CHG Soap to your body ONLY FROM THE NECK DOWN.   Do not use on open                           Wound or open sores. Avoid contact with eyes, ears mouth and genitals (private parts).                        Genitals (private parts) with your normal soap.              6.  Wash thoroughly, paying special attention to the area where your surgery  will be performed.   7.  Thoroughly rinse your body with warm water from the neck down.   8.  DO NOT shower/wash with your normal soap after using and rinsing off  the CHG Soap .                9.  Pat yourself dry with a clean towel.             10.  Wear clean pajamas.             11.  Place clean sheets on your bed the night of your first shower and do not  sleep with pets.  Day of Surgery : Do not apply any lotions/deodorants the morning of surgery.  Please wear clean clothes to the hospital/surgery center.  FAILURE TO FOLLOW THESE INSTRUCTIONS MAY RESULT IN THE CANCELLATION OF YOUR SURGERY    PATIENT SIGNATURE_________________________________  ______________________________________________________________________    WHAT IS A BLOOD TRANSFUSION? Blood Transfusion Information  A transfusion is the replacement of blood or some of its parts. Blood is made up of multiple cells which provide different functions.  Red blood cells carry oxygen and are used for blood loss replacement.  White blood cells fight against infection.  Platelets control bleeding.  Plasma helps clot blood.  Other blood products are available for specialized needs, such as hemophilia or other clotting disorders. BEFORE THE TRANSFUSION  Who gives blood for transfusions?   Healthy volunteers who are fully  evaluated to make sure their blood is safe. This is blood bank blood. Transfusion therapy is the safest it has ever been in the practice of medicine. Before blood is taken from a donor, a complete history is taken to make sure that person has no history of diseases nor engages in risky social behavior (examples are intravenous drug use or sexual activity with multiple partners). The donor's travel history is screened to minimize risk of transmitting infections, such as malaria. The donated blood is tested for signs of infectious diseases, such as HIV and hepatitis. The blood is then tested to be sure it is compatible with you in order to minimize the chance of a transfusion reaction. If you or a relative donates blood, this is often done in anticipation of surgery and is not appropriate for emergency situations. It takes many days to process the donated blood. RISKS AND COMPLICATIONS Although transfusion therapy is very safe and saves many lives, the main dangers of transfusion include:   Getting an infectious disease.  Developing a transfusion reaction. This is an allergic reaction to something in the blood you were given. Every precaution is taken to prevent this. The decision to have a blood transfusion has been considered carefully by your caregiver before blood is given. Blood is not given unless the benefits outweigh the risks. AFTER THE TRANSFUSION  Right after receiving a blood transfusion, you will usually feel much better and more energetic. This is especially true if your red blood cells have gotten low (anemic). The transfusion raises the level of the red blood cells which carry oxygen, and this usually causes an energy increase.  The nurse administering the transfusion will monitor you carefully for complications. HOME CARE INSTRUCTIONS  No special instructions are needed after a transfusion. You may find your energy is better. Speak with your caregiver about any limitations on activity  for underlying diseases you may have. SEEK MEDICAL CARE IF:   Your condition is not improving after your transfusion.  You develop redness or irritation at the intravenous (IV) site. SEEK IMMEDIATE MEDICAL CARE IF:  Any of the following symptoms occur over the next 12 hours:  Shaking chills.  You have a temperature by mouth above 102 F (38.9 C), not controlled by medicine.  Chest, back, or muscle pain.  People around you feel you are not acting correctly or are confused.  Shortness of breath or difficulty breathing.  Dizziness and fainting.  You get a rash or develop hives.  You have a decrease in urine output.  Your urine turns a dark color or changes to pink, red, or brown. Any of the following symptoms occur over the next 10 days:  You have a temperature by mouth above 102 F (38.9 C), not controlled by medicine.  Shortness of breath.  Weakness after normal activity.  The white part of the eye turns yellow (jaundice).  You have a decrease in the amount of urine or are urinating less often.  Your urine turns a dark color or changes to pink, red, or brown. Document Released: 03/02/2000 Document Revised: 05/28/2011 Document Reviewed: 10/20/2007 [facility] Patient Information 2014 [place], [place].  _______________________________________________________________________

## 2014-01-21 ENCOUNTER — Telehealth: Payer: Self-pay | Admitting: Gynecologic Oncology

## 2014-01-21 ENCOUNTER — Encounter (HOSPITAL_COMMUNITY)
Admission: RE | Admit: 2014-01-21 | Discharge: 2014-01-21 | Disposition: A | Discharge status: Home / Self Care | Payer: BC Managed Care – PPO | Source: Ambulatory Visit | Attending: Gynecologic Oncology | Admitting: Gynecologic Oncology

## 2014-01-21 ENCOUNTER — Ambulatory Visit (HOSPITAL_COMMUNITY)
Admission: RE | Admit: 2014-01-21 | Discharge: 2014-01-21 | Disposition: A | Discharge status: Home / Self Care | Payer: BC Managed Care – PPO | Source: Ambulatory Visit | Attending: Gynecologic Oncology | Admitting: Gynecologic Oncology

## 2014-01-21 ENCOUNTER — Encounter (HOSPITAL_COMMUNITY): Payer: Self-pay

## 2014-01-21 DIAGNOSIS — Z01810 Encounter for preprocedural cardiovascular examination: Secondary | ICD-10-CM | POA: Diagnosis present

## 2014-01-21 DIAGNOSIS — Z853 Personal history of malignant neoplasm of breast: Secondary | ICD-10-CM | POA: Insufficient documentation

## 2014-01-21 HISTORY — DX: Personal history of other medical treatment: Z92.89

## 2014-01-21 HISTORY — DX: Acute embolism and thrombosis of unspecified deep veins of unspecified lower extremity: I82.409

## 2014-01-21 HISTORY — DX: Pain in unspecified wrist: M25.539

## 2014-01-21 LAB — CBC WITH DIFFERENTIAL/PLATELET
Basophils Absolute: 0 10*3/uL (ref 0.0–0.1)
Basophils Relative: 0 % (ref 0–1)
EOS ABS: 0.2 10*3/uL (ref 0.0–0.7)
Eosinophils Relative: 3 % (ref 0–5)
HEMATOCRIT: 37.5 % (ref 36.0–46.0)
Hemoglobin: 12.4 g/dL (ref 12.0–15.0)
Lymphocytes Relative: 29 % (ref 12–46)
Lymphs Abs: 2 10*3/uL (ref 0.7–4.0)
MCH: 30.5 pg (ref 26.0–34.0)
MCHC: 33.1 g/dL (ref 30.0–36.0)
MCV: 92.1 fL (ref 78.0–100.0)
MONO ABS: 0.4 10*3/uL (ref 0.1–1.0)
Monocytes Relative: 6 % (ref 3–12)
Neutro Abs: 4.3 10*3/uL (ref 1.7–7.7)
Neutrophils Relative %: 62 % (ref 43–77)
PLATELETS: 242 10*3/uL (ref 150–400)
RBC: 4.07 MIL/uL (ref 3.87–5.11)
RDW: 13.4 % (ref 11.5–15.5)
WBC: 6.9 10*3/uL (ref 4.0–10.5)

## 2014-01-21 LAB — COMPREHENSIVE METABOLIC PANEL
ALT: 22 U/L (ref 0–35)
ANION GAP: 10 (ref 5–15)
AST: 19 U/L (ref 0–37)
Albumin: 3.4 g/dL — ABNORMAL LOW (ref 3.5–5.2)
Alkaline Phosphatase: 99 U/L (ref 39–117)
BUN: 11 mg/dL (ref 6–23)
CO2: 27 mEq/L (ref 19–32)
CREATININE: 0.82 mg/dL (ref 0.50–1.10)
Calcium: 9.1 mg/dL (ref 8.4–10.5)
Chloride: 100 mEq/L (ref 96–112)
GFR calc Af Amer: >90 mL/min (ref >90)
GFR, EST NON AFRICAN AMERICAN: 90 mL/min — AB (ref >90)
GLUCOSE: 121 mg/dL — AB (ref 70–99)
Potassium: 3.4 mEq/L — ABNORMAL LOW (ref 3.7–5.3)
SODIUM: 137 meq/L (ref 137–147)
TOTAL PROTEIN: 6.5 g/dL (ref 6.0–8.3)
Total Bilirubin: 0.5 mg/dL (ref 0.3–1.2)

## 2014-01-21 LAB — URINALYSIS, ROUTINE W REFLEX MICROSCOPIC
BILIRUBIN URINE: NEGATIVE
GLUCOSE, UA: NEGATIVE mg/dL
Hgb urine dipstick: NEGATIVE
KETONES UR: NEGATIVE mg/dL
Leukocytes, UA: NEGATIVE
Nitrite: NEGATIVE
Protein, ur: NEGATIVE mg/dL
Specific Gravity, Urine: 1.014 (ref 1.005–1.030)
UROBILINOGEN UA: 0.2 mg/dL (ref 0.0–1.0)
pH: 6.5 (ref 5.0–8.0)

## 2014-01-21 LAB — PREGNANCY, URINE: Preg Test, Ur: NEGATIVE

## 2014-01-21 NOTE — Telephone Encounter (Signed)
Patient advised of Dr. Elenora Gamma recommendations that a hysterectomy would not be indicated at this time and would pose more risk of complications for her.  Verbalizing understanding.  Advised to call for any questions or concerns.

## 2014-01-21 NOTE — Telephone Encounter (Signed)
Spoke with the patient about biopsy and pap results.  Advised she would be contacted later today with Dr. Elenora Gamma recommendations.

## 2014-01-25 ENCOUNTER — Telehealth: Payer: Self-pay | Admitting: *Deleted

## 2014-01-25 NOTE — Telephone Encounter (Signed)
Called pt with reminder to drink clear liquids, NPO after midnight prior to surgery 11/10

## 2014-01-26 ENCOUNTER — Ambulatory Visit (HOSPITAL_COMMUNITY): Payer: BC Managed Care – PPO | Admitting: Anesthesiology

## 2014-01-26 ENCOUNTER — Encounter (HOSPITAL_COMMUNITY): Payer: Self-pay

## 2014-01-26 ENCOUNTER — Ambulatory Visit (HOSPITAL_COMMUNITY)
Admission: RE | Admit: 2014-01-26 | Discharge: 2014-01-26 | Disposition: A | Discharge status: Home / Self Care | Payer: BC Managed Care – PPO | Source: Ambulatory Visit | Attending: Gynecologic Oncology | Admitting: Gynecologic Oncology

## 2014-01-26 ENCOUNTER — Encounter (HOSPITAL_COMMUNITY)
Admission: RE | Disposition: A | Discharge status: Home / Self Care | Payer: Self-pay | Source: Ambulatory Visit | Attending: Gynecologic Oncology

## 2014-01-26 DIAGNOSIS — N838 Other noninflammatory disorders of ovary, fallopian tube and broad ligament: Secondary | ICD-10-CM | POA: Insufficient documentation

## 2014-01-26 DIAGNOSIS — D271 Benign neoplasm of left ovary: Secondary | ICD-10-CM | POA: Insufficient documentation

## 2014-01-26 DIAGNOSIS — C50312 Malignant neoplasm of lower-inner quadrant of left female breast: Secondary | ICD-10-CM | POA: Insufficient documentation

## 2014-01-26 DIAGNOSIS — N870 Mild cervical dysplasia: Secondary | ICD-10-CM

## 2014-01-26 DIAGNOSIS — N736 Female pelvic peritoneal adhesions (postinfective): Secondary | ICD-10-CM | POA: Insufficient documentation

## 2014-01-26 DIAGNOSIS — Z86718 Personal history of other venous thrombosis and embolism: Secondary | ICD-10-CM | POA: Insufficient documentation

## 2014-01-26 DIAGNOSIS — Z87891 Personal history of nicotine dependence: Secondary | ICD-10-CM | POA: Insufficient documentation

## 2014-01-26 DIAGNOSIS — N9489 Other specified conditions associated with female genital organs and menstrual cycle: Secondary | ICD-10-CM | POA: Diagnosis not present

## 2014-01-26 DIAGNOSIS — Z881 Allergy status to other antibiotic agents status: Secondary | ICD-10-CM | POA: Insufficient documentation

## 2014-01-26 DIAGNOSIS — Z6836 Body mass index (BMI) 36.0-36.9, adult: Secondary | ICD-10-CM | POA: Diagnosis not present

## 2014-01-26 DIAGNOSIS — E669 Obesity, unspecified: Secondary | ICD-10-CM | POA: Diagnosis not present

## 2014-01-26 DIAGNOSIS — C50919 Malignant neoplasm of unspecified site of unspecified female breast: Secondary | ICD-10-CM

## 2014-01-26 DIAGNOSIS — R000 Tachycardia, unspecified: Secondary | ICD-10-CM | POA: Diagnosis not present

## 2014-01-26 HISTORY — PX: LEEP: SHX91

## 2014-01-26 HISTORY — PX: ROBOTIC ASSISTED BILATERAL SALPINGO OOPHERECTOMY: SHX6078

## 2014-01-26 LAB — TYPE AND SCREEN
ABO/RH(D): A POS
Antibody Screen: NEGATIVE

## 2014-01-26 SURGERY — ROBOTIC ASSISTED BILATERAL SALPINGO OOPHORECTOMY
Anesthesia: General

## 2014-01-26 MED ORDER — IODINE STRONG (LUGOLS) 5 % PO SOLN
ORAL | Status: AC
  Filled 2014-01-26: qty 1

## 2014-01-26 MED ORDER — NEOSTIGMINE METHYLSULFATE 10 MG/10ML IV SOLN
INTRAVENOUS | Status: DC | PRN
Start: 2014-01-26 — End: 2014-01-26
  Administered 2014-01-26: 5 mg via INTRAVENOUS

## 2014-01-26 MED ORDER — NEOSTIGMINE METHYLSULFATE 10 MG/10ML IV SOLN
INTRAVENOUS | Status: AC
  Filled 2014-01-26: qty 1

## 2014-01-26 MED ORDER — SODIUM CHLORIDE 0.9 % IJ SOLN: 3.0000 mL | INTRAMUSCULAR | Status: DC | PRN

## 2014-01-26 MED ORDER — LACTATED RINGERS IV SOLN
INTRAVENOUS | Status: DC | PRN
  Administered 2014-01-26 (×2): via INTRAVENOUS

## 2014-01-26 MED ORDER — GLYCOPYRROLATE 0.2 MG/ML IJ SOLN
INTRAMUSCULAR | Status: AC
  Filled 2014-01-26: qty 3

## 2014-01-26 MED ORDER — CIPROFLOXACIN IN D5W 400 MG/200ML IV SOLN
400.0000 mg | INTRAVENOUS | Status: AC
  Administered 2014-01-26: 400 mg via INTRAVENOUS

## 2014-01-26 MED ORDER — LIDOCAINE HCL (CARDIAC) 20 MG/ML IV SOLN
INTRAVENOUS | Status: DC | PRN
  Administered 2014-01-26: 100 mg via INTRAVENOUS

## 2014-01-26 MED ORDER — ATROPINE SULFATE 0.4 MG/ML IJ SOLN
INTRAMUSCULAR | Status: AC
  Filled 2014-01-26: qty 1

## 2014-01-26 MED ORDER — PROPOFOL 10 MG/ML IV BOLUS
INTRAVENOUS | Status: DC | PRN
  Administered 2014-01-26: 180 mg via INTRAVENOUS

## 2014-01-26 MED ORDER — ACETAMINOPHEN 650 MG RE SUPP
650.0000 mg | RECTAL | Status: DC | PRN
  Filled 2014-01-26: qty 1

## 2014-01-26 MED ORDER — ROCURONIUM BROMIDE 100 MG/10ML IV SOLN
INTRAVENOUS | Status: DC | PRN
  Administered 2014-01-26: 50 mg via INTRAVENOUS
  Administered 2014-01-26: 5 mg via INTRAVENOUS

## 2014-01-26 MED ORDER — EPHEDRINE SULFATE 50 MG/ML IJ SOLN
INTRAMUSCULAR | Status: AC
  Filled 2014-01-26: qty 1

## 2014-01-26 MED ORDER — DEXAMETHASONE SODIUM PHOSPHATE 10 MG/ML IJ SOLN
INTRAMUSCULAR | Status: AC
  Filled 2014-01-26: qty 1

## 2014-01-26 MED ORDER — OXYCODONE HCL 5 MG PO TABS
5.0000 mg | ORAL_TABLET | ORAL | Status: DC | PRN
  Filled 2014-01-26: qty 1

## 2014-01-26 MED ORDER — METRONIDAZOLE IN NACL 5-0.79 MG/ML-% IV SOLN
500.0000 mg | INTRAVENOUS | Status: AC
  Administered 2014-01-26: 1 g via INTRAVENOUS

## 2014-01-26 MED ORDER — PROPOFOL 10 MG/ML IV BOLUS
INTRAVENOUS | Status: AC
  Filled 2014-01-26: qty 20

## 2014-01-26 MED ORDER — PHENYLEPHRINE HCL 10 MG/ML IJ SOLN
INTRAMUSCULAR | Status: AC
  Filled 2014-01-26: qty 1

## 2014-01-26 MED ORDER — PROMETHAZINE HCL 25 MG/ML IJ SOLN
6.2500 mg | INTRAMUSCULAR | Status: DC | PRN
  Administered 2014-01-26: 12.5 mg via INTRAVENOUS

## 2014-01-26 MED ORDER — ROCURONIUM BROMIDE 100 MG/10ML IV SOLN
INTRAVENOUS | Status: AC
  Filled 2014-01-26: qty 1

## 2014-01-26 MED ORDER — MEPERIDINE HCL 50 MG/ML IJ SOLN: 6.2500 mg | INTRAMUSCULAR | Status: DC | PRN

## 2014-01-26 MED ORDER — HYDROMORPHONE HCL 1 MG/ML IJ SOLN
0.2500 mg | INTRAMUSCULAR | Status: DC | PRN
  Administered 2014-01-26 (×4): 0.5 mg via INTRAVENOUS

## 2014-01-26 MED ORDER — HEPARIN SODIUM (PORCINE) 5000 UNIT/ML IJ SOLN
5000.0000 [IU] | INTRAMUSCULAR | Status: AC
  Administered 2014-01-26: 5000 [IU] via SUBCUTANEOUS
  Filled 2014-01-26: qty 1

## 2014-01-26 MED ORDER — ACETAMINOPHEN 325 MG PO TABS: 650.0000 mg | ORAL_TABLET | ORAL | Status: DC | PRN

## 2014-01-26 MED ORDER — ENOXAPARIN SODIUM 40 MG/0.4ML ~~LOC~~ SOLN: 40.0000 mg | SUBCUTANEOUS | Status: DC

## 2014-01-26 MED ORDER — ACETAMINOPHEN 10 MG/ML IV SOLN
1000.0000 mg | Freq: Once | INTRAVENOUS | Status: AC
  Administered 2014-01-26: 1000 mg via INTRAVENOUS
  Filled 2014-01-26: qty 100

## 2014-01-26 MED ORDER — STERILE WATER FOR IRRIGATION IR SOLN
Status: DC | PRN
  Administered 2014-01-26: 3000 mL

## 2014-01-26 MED ORDER — CIPROFLOXACIN IN D5W 400 MG/200ML IV SOLN
INTRAVENOUS | Status: AC
  Filled 2014-01-26: qty 200

## 2014-01-26 MED ORDER — SODIUM CHLORIDE 0.9 % IJ SOLN: 3.0000 mL | Freq: Two times a day (BID) | INTRAMUSCULAR | Status: DC

## 2014-01-26 MED ORDER — LACTATED RINGERS IR SOLN
Status: DC | PRN
  Administered 2014-01-26: 1000 mL

## 2014-01-26 MED ORDER — SODIUM CHLORIDE 0.9 % IV SOLN: 250.0000 mL | INTRAVENOUS | Status: DC | PRN

## 2014-01-26 MED ORDER — SODIUM CHLORIDE 0.9 % IJ SOLN
INTRAMUSCULAR | Status: AC
  Filled 2014-01-26: qty 10

## 2014-01-26 MED ORDER — ONDANSETRON HCL 4 MG/2ML IJ SOLN
INTRAMUSCULAR | Status: AC
  Filled 2014-01-26: qty 2

## 2014-01-26 MED ORDER — HYDROMORPHONE HCL 1 MG/ML IJ SOLN
INTRAMUSCULAR | Status: AC
  Filled 2014-01-26: qty 1

## 2014-01-26 MED ORDER — METRONIDAZOLE IN NACL 5-0.79 MG/ML-% IV SOLN
INTRAVENOUS | Status: AC
  Filled 2014-01-26: qty 100

## 2014-01-26 MED ORDER — FENTANYL CITRATE 0.05 MG/ML IJ SOLN
INTRAMUSCULAR | Status: DC | PRN
  Administered 2014-01-26 (×5): 50 ug via INTRAVENOUS

## 2014-01-26 MED ORDER — IODINE STRONG (LUGOLS) 5 % PO SOLN: 0.2000 mL | Freq: Three times a day (TID) | ORAL | Status: DC

## 2014-01-26 MED ORDER — ONDANSETRON HCL 4 MG/2ML IJ SOLN
INTRAMUSCULAR | Status: DC | PRN
  Administered 2014-01-26: 4 mg via INTRAVENOUS

## 2014-01-26 MED ORDER — GLYCOPYRROLATE 0.2 MG/ML IJ SOLN
INTRAMUSCULAR | Status: AC
  Filled 2014-01-26: qty 1

## 2014-01-26 MED ORDER — FENTANYL CITRATE 0.05 MG/ML IJ SOLN
INTRAMUSCULAR | Status: AC
  Filled 2014-01-26: qty 5

## 2014-01-26 MED ORDER — MIDAZOLAM HCL 5 MG/5ML IJ SOLN
INTRAMUSCULAR | Status: DC | PRN
  Administered 2014-01-26: 2 mg via INTRAVENOUS

## 2014-01-26 MED ORDER — PROMETHAZINE HCL 25 MG/ML IJ SOLN
INTRAMUSCULAR | Status: AC
  Filled 2014-01-26: qty 1

## 2014-01-26 MED ORDER — GLYCOPYRROLATE 0.2 MG/ML IJ SOLN
INTRAMUSCULAR | Status: DC | PRN
  Administered 2014-01-26: .8 mg via INTRAVENOUS

## 2014-01-26 MED ORDER — OXYCODONE HCL 5 MG/5ML PO SOLN: 5.0000 mg | Freq: Once | ORAL | Status: AC | PRN

## 2014-01-26 MED ORDER — OXYCODONE HCL 5 MG PO TABS
5.0000 mg | ORAL_TABLET | Freq: Once | ORAL | Status: AC | PRN
  Administered 2014-01-26: 5 mg via ORAL

## 2014-01-26 MED ORDER — SUCCINYLCHOLINE CHLORIDE 20 MG/ML IJ SOLN
INTRAMUSCULAR | Status: DC | PRN
  Administered 2014-01-26: 100 mg via INTRAVENOUS

## 2014-01-26 MED ORDER — OXYCODONE-ACETAMINOPHEN 10-325 MG PO TABS: 1.0000 | ORAL_TABLET | ORAL | Status: DC | PRN

## 2014-01-26 MED ORDER — DEXAMETHASONE SODIUM PHOSPHATE 10 MG/ML IJ SOLN
INTRAMUSCULAR | Status: DC | PRN
  Administered 2014-01-26: 10 mg via INTRAVENOUS

## 2014-01-26 MED ORDER — LIDOCAINE HCL (CARDIAC) 20 MG/ML IV SOLN
INTRAVENOUS | Status: AC
  Filled 2014-01-26: qty 5

## 2014-01-26 MED ORDER — MIDAZOLAM HCL 2 MG/2ML IJ SOLN
INTRAMUSCULAR | Status: AC
  Filled 2014-01-26: qty 2

## 2014-01-26 SURGICAL SUPPLY — 57 items
BENZOIN TINCTURE PRP APPL 2/3 (GAUZE/BANDAGES/DRESSINGS) ×4 IMPLANT
CABLE HIGH FREQUENCY MONO STRZ (ELECTRODE) ×4 IMPLANT
CHLORAPREP W/TINT 26ML (MISCELLANEOUS) ×8 IMPLANT
CLOSURE WOUND 1/2 X4 (GAUZE/BANDAGES/DRESSINGS) ×1
CORDS BIPOLAR (ELECTRODE) ×4 IMPLANT
COVER SURGICAL LIGHT HANDLE (MISCELLANEOUS) ×4 IMPLANT
COVER TIP SHEARS 8 DVNC (MISCELLANEOUS) ×2 IMPLANT
COVER TIP SHEARS 8MM DA VINCI (MISCELLANEOUS) ×2
DECANTER SPIKE VIAL GLASS SM (MISCELLANEOUS) IMPLANT
DRAPE SHEET LG 3/4 BI-LAMINATE (DRAPES) ×8 IMPLANT
DRAPE SURG IRRIG POUCH 19X23 (DRAPES) ×4 IMPLANT
DRAPE TABLE BACK 44X90 PK DISP (DRAPES) ×8 IMPLANT
DRAPE WARM FLUID 44X44 (DRAPE) ×4 IMPLANT
DRSG TEGADERM 2-3/8X2-3/4 SM (GAUZE/BANDAGES/DRESSINGS) ×4 IMPLANT
DRSG TEGADERM 4X4.75 (GAUZE/BANDAGES/DRESSINGS) ×20 IMPLANT
DRSG TEGADERM 6X8 (GAUZE/BANDAGES/DRESSINGS) ×8 IMPLANT
ELECT LLETZ BALL 3MM DISP (ELECTRODE) ×4 IMPLANT
ELECT LOOP LLETZ 15X12 DISP (CUTTING LOOP) ×4 IMPLANT
ELECT REM PT RETURN 9FT ADLT (ELECTROSURGICAL) ×4
ELECTRODE REM PT RTRN 9FT ADLT (ELECTROSURGICAL) ×2 IMPLANT
GAUZE SPONGE 2X2 8PLY STRL LF (GAUZE/BANDAGES/DRESSINGS) ×4 IMPLANT
GLOVE BIO SURGEON STRL SZ 6.5 (GLOVE) ×12 IMPLANT
GLOVE BIO SURGEON STRL SZ7 (GLOVE) ×4 IMPLANT
GLOVE BIO SURGEONS STRL SZ 6.5 (GLOVE) ×4
GLOVE BIOGEL PI IND STRL 7.0 (GLOVE) ×4 IMPLANT
GLOVE BIOGEL PI INDICATOR 7.0 (GLOVE) ×4
GOWN STRL REUS W/ TWL XL LVL3 (GOWN DISPOSABLE) ×4 IMPLANT
GOWN STRL REUS W/TWL XL LVL3 (GOWN DISPOSABLE) ×4
HOLDER FOLEY CATH W/STRAP (MISCELLANEOUS) ×4 IMPLANT
KIT ACCESSORY DA VINCI DISP (KITS) ×2
KIT ACCESSORY DVNC DISP (KITS) ×2 IMPLANT
KIT BASIN OR (CUSTOM PROCEDURE TRAY) ×4 IMPLANT
MANIPULATOR UTERINE 4.5 ZUMI (MISCELLANEOUS) ×4 IMPLANT
PEN SKIN MARKING BROAD (MISCELLANEOUS) ×4 IMPLANT
PENCIL BUTTON HOLSTER BLD 10FT (ELECTRODE) ×4 IMPLANT
POUCH SPECIMEN RETRIEVAL 10MM (ENDOMECHANICALS) ×8 IMPLANT
SET TUBE IRRIG SUCTION NO TIP (IRRIGATION / IRRIGATOR) ×4 IMPLANT
SHEET LAVH (DRAPES) ×4 IMPLANT
SOLUTION ANTI FOG 6CC (MISCELLANEOUS) ×4 IMPLANT
SOLUTION ELECTROLUBE (MISCELLANEOUS) ×4 IMPLANT
SPONGE GAUZE 2X2 STER 10/PKG (GAUZE/BANDAGES/DRESSINGS) ×4
SPONGE LAP 18X18 X RAY DECT (DISPOSABLE) ×4 IMPLANT
STRIP CLOSURE SKIN 1/2X4 (GAUZE/BANDAGES/DRESSINGS) ×3 IMPLANT
SUT VIC AB 0 CT1 27 (SUTURE) ×2
SUT VIC AB 0 CT1 27XBRD ANTBC (SUTURE) ×2 IMPLANT
SUT VIC AB 4-0 PS2 27 (SUTURE) ×8 IMPLANT
SYR BULB IRRIGATION 50ML (SYRINGE) IMPLANT
TOWEL OR 17X26 10 PK STRL BLUE (TOWEL DISPOSABLE) ×8 IMPLANT
TOWEL OR NON WOVEN STRL DISP B (DISPOSABLE) ×4 IMPLANT
TRAP SPECIMEN MUCOUS 40CC (MISCELLANEOUS) IMPLANT
TRAY FOLEY CATH 14FRSI W/METER (CATHETERS) ×4 IMPLANT
TRAY LAPAROSCOPIC (CUSTOM PROCEDURE TRAY) ×4 IMPLANT
TROCAR 12M 150ML BLUNT (TROCAR) ×4 IMPLANT
TROCAR BLADELESS OPT 5 100 (ENDOMECHANICALS) ×4 IMPLANT
TROCAR XCEL 12X100 BLDLESS (ENDOMECHANICALS) ×4 IMPLANT
TUBING INSUFFLATION 10FT LAP (TUBING) ×4 IMPLANT
WATER STERILE IRR 1500ML POUR (IV SOLUTION) IMPLANT

## 2014-01-26 NOTE — Progress Notes (Signed)
Minimal amount of bloody vaginal drainage to be expected. Peri pad to patient.

## 2014-01-26 NOTE — Op Note (Signed)
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   Surgery: Robotic bilateral salpingo-oophorectomy, LEEP   Surgeons:  Kimberlie Csaszar A. Alycia Rossetti, MD; Lahoma Crocker, MD   Anesthesia: General   Estimated blood loss: 50 ml   IVF: 1200 ml   Urine output: 30  ml   Complications: None   Pathology: Bilateral tubes and ovaries, Cervical LEEP  Operative findings: Adhesions of the uterus to the anterior abdominal wall from her cesarean section. Adhesions in the RUQ from her cholecystectomy. Normal adnexa. Cervix without gross visual lesions.  Procedure: The patient was identified in the preoperative holding area. Informed consent was signed on the chart. Patient was seen history was reviewed and exam was performed.   The patient was then taken to the operating room and placed in the supine position with SCD hose on. She was then placed in the dorsolithotomy position. Her arms were tucked at her side with appropriate precautions on the gel pad. General anesthesia was then induced without difficulty. Shoulder blocks were then placed in the usual fashion with appropriate precautions. A OG-tube was placed to suction. First timeout was performed to confirm the patient, procedure, antibiotic, allergy status, estimated blood loss and OR time. The perineum was then prepped in the usual fashion with Betadine. A 14 French Foley was inserted into the bladder under sterile conditions. A sterile speculum was placed in the vagina. The cervix was without lesions. The cervix was grasped with a single-tooth tenaculum and dilated without difficulty. A ZUMI  was placed without difficulty. The abdomen was then prepped with 2 Chlor prep sponges per protocol.   Patient was then draped after the prep was dried. Second timeout was performed to confirm the above. After again confirming OG tube placement and it was to  suction. A stab-wound was made in left upper quadrant 2 cm below the costal margin on the left in the midclavicular line. A 5 mm operative report was used to assure intra-abdominal placement. The abdomen was insufflated. At this point all points during the procedure the patient's intra-abdominal pressure was not increased over 15 mm of mercury. After insufflation was complete, the patient was placed in deep Trendelenburg position. 25 cm above the pubic symphysis that area was marked the camera port. Bilateral robotic ports were marked 10 cm from the midline incision at approximately 5 angle. Under direct visualization each of the trochars was placed into the abdomen. The small bowel was folded on its mesentery to allow visualization to the pelvis. The 5 mm LUQ port was then converted to a 10/12 port under direct visualization.  After assuring adequate visualization, the robot was then docked in the usual fashion. Under direct visualization the robotic instruments replaced.   The posterior leaf of the broad ligament were then taken down in the usual fashion. The ureter was identified on the medial leaf of the broad ligament. A window was made between the IP and the ureter. The IP was coagulated with bipolar cautery and transected. The uteroovarian vessels were coagulated and transected. The right adenxa was placed in an Endocatch bag. The same procedure was performed on the left side. The adhesions of the uterus to the anterior abdominal wall were taken down with sharp dissection.  The Endocatch bags were delivered through the assistant port.  The abdomen and pelvis were irrigated and pedicles were noted to be hemostatic. The robotic instruments were removed under direct visualization as  were the robotic trochars. The pneumoperitoneum was removed. The patient was then taken out of the Trendelenburg position. Using of 0 Vicryl on a UR 6 needle the midline port fascia was closed after being grasped with allis  clamps. The subcutaneous tissues and the fascia of the port in the left upper quadrant was reapproximated. The skin was closed using 4-0 Vicryl. Steri-Strips and benzoin were applied.   Coated speculum was placed in the vagina. The cervix is tilted posteriorly so sutures with 0 Vicryl were placed at the 3 and 9:00 positions for traction. The medium LEEP loop was used with a cautery setting of 60 to perform the LEEP. The roller ball was used for hemostasis.  The sutures were removed. There was adequate hemostasis.  All instrument needle and Ray-Tec counts were correct x2. The patient tolerated the procedure well and was taken to the recovery room in stable condition. This is [name] dictating an operative note on patient [name].

## 2014-01-26 NOTE — Anesthesia Postprocedure Evaluation (Signed)
  Anesthesia Post-op Note  Patient: [name]  Procedure(s) Performed: Procedure(s) (LRB): ROBOTIC ASSISTED BILATERAL SALPINGO OOPHORECTOMY (Bilateral) LOOP ELECTROSURGICAL EXCISION PROCEDURE (LEEP) (N/A)  Patient Location: PACU  Anesthesia Type: General  Level of Consciousness: awake and alert   Airway and Oxygen Therapy: Patient Spontanous Breathing  Post-op Pain: mild  Post-op Assessment: Post-op Vital signs reviewed, Patient's Cardiovascular Status Stable, Respiratory Function Stable, Patent Airway and No signs of Nausea or vomiting  Last Vitals:  Filed Vitals:   01/26/14 0945  BP: 109/60  Pulse: 62  Temp:   Resp: 19    Post-op Vital Signs: stable   Complications: No apparent anesthesia complications

## 2014-01-26 NOTE — Interval H&P Note (Signed)
History and Physical Interval Note:  01/26/2014 7:28 AM  [name]  has presented today for surgery, with the diagnosis of breast cancer,CERVICAL DYSPLASIS  The various methods of treatment have been discussed with the patient and family. After consideration of risks, benefits and other options for treatment, the patient has consented to  Procedure(s): ROBOTIC ASSISTED BILATERAL SALPINGO OOPHORECTOMY (Bilateral) LOOP ELECTROSURGICAL EXCISION PROCEDURE (LEEP) (N/A) as a surgical intervention .  The patient's history has been reviewed, patient examined, no change in status, stable for surgery.  I have reviewed the patient's chart and labs.  Questions were answered to the patient's satisfaction.     Ponca A.

## 2014-01-26 NOTE — Transfer of Care (Signed)
Immediate Anesthesia Transfer of Care Note  Patient: [name]  Procedure(s) Performed: Procedure(s) (LRB): ROBOTIC ASSISTED BILATERAL SALPINGO OOPHORECTOMY (Bilateral) LOOP ELECTROSURGICAL EXCISION PROCEDURE (LEEP) (N/A)  Patient Location: PACU  Anesthesia Type: General  Level of Consciousness: sedated, patient cooperative and responds to stimulation  Airway & Oxygen Therapy: Patient Spontanous Breathing and Patient connected to face mask oxgen  Post-op Assessment: Report given to PACU RN and Post -op Vital signs reviewed and stable  Post vital signs: Reviewed and stable  Complications: No apparent anesthesia complications

## 2014-01-26 NOTE — H&P (View-Only) (Signed)
Consult Note: Gyn-Onc  Consult was requested by Dr. Jana Cox for the evaluation of [name] 38 y.o. female with estrogen receptor positive breast cancer desiring surgical menopause for therapeutic indication.  CC:  Chief Complaint  Patient presents with  . preop surgery    breast cancer, desires BSO  . positive for HRHPV    Assessment/Plan:  [name]  is a 38 y.o.  year old who was initially seen in consultation at the request of Dr. Jana Cox for evaluation for therapeutic surgical menopause as part of the treatment of premenopausal estrogen, progesterone receptor positive stage II breast cancer. She is scheduled for a robotic BSO on November 10th with Dr Nancy Marus.   She had positive HRHPV subtypes on her screening pap (normal cytology) from August, 2015 and received colposcopy and biopsies today per recommendations from the ATHENA trial. I see no apparent CIN3 or invasive cervical process on colposcopic evaluation.  Overall [name] is a good surgical candidate. 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 I discussed that she would be a candidate for her a laparoscopic approach with robotic assistance with bilateral salpingo-oophorectomy, as part of an outpatient procedure. We discussed operative risks including  bleeding, infection, damage to internal organs (such as bladder,ureters, bowels), blood clot, reoperation and rehospitalization.  We will recommend Lovenox postop for prophylaxis for 2-4 weeks given her prior history of DVT and increased risks for recurrent VTE around the time of pelvic surgeries.  I will followup today's biopsy results and follow ASCCP guidelines regarding further workup and intervention if results are positive.  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 She underwent a course of therapy with left breast lumpectomy and left axillary sentinel lymph node biopsy, adjuvant chemotherapy with 5-FU, epirubicin, and Cytoxan, weekly Taxol, adjuvant radiation to the left breast and regional lymph nodes, goserelin and anastrozole. 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 She received Xarelto therapy until May 2014 at which time it was discontinued when ultrasound confirmed no visualized thrombosis. She is experiencing significant dissatisfaction with her monthly goserelin injection and desires definitive surgical menopause.  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 She is no other history of abnormal Pap smears, or cervical procedures. She has been amenorrheic since September of 2014 during chemotherapy treatment (including no resumption of menses after completion of chemotherapy). Repeat pap on 8/15 showed normal cytology and positive HRHPV.   Interval History: She continues to be amenorrheic. Last dose of goserelin was 10/26.    Current Meds:  Outpatient Encounter Prescriptions as of 01/14/2014  Medication Sig  . ALPRAZolam (XANAX) 0.25 MG tablet Take 1 tablet (0.25 mg total) by mouth at bedtime as needed for sleep.  Marland Kitchen anastrozole (ARIMIDEX) 1 MG tablet Take 1 tablet (1 mg total) by mouth daily.  Marland Kitchen lisinopril (PRINIVIL,ZESTRIL) 10 MG tablet TAKE 1 TABLET (10 MG TOTAL) BY MOUTH DAILY.  Marland Kitchen LORazepam (ATIVAN) 0.5 MG tablet Take 1 tablet (0.5 mg total) by mouth at bedtime as needed (Nausea or vomiting).  Marland Kitchen oxyCODONE-acetaminophen (ROXICET) 5-325 MG per tablet Take 1-2 tablets  by mouth every 4 (four) hours as needed.    Allergy:  Allergies  Allergen Reactions  . Amoxicillin Other (See Comments)     States "it does not work"    Social Hx:   History   Social History  . Marital Status: Divorced    Spouse Name: N/A    Number of Children: N/A  . Years of Education: N/A   Occupational History  . Not on file.   Social History Main Topics  . Smoking status: Former Smoker    Quit date: 12/03/2012  . Smokeless tobacco: Never Used  . Alcohol Use: Yes     Comment: occasionally  . Drug Use: No  . Sexual Activity: Yes    Birth Control/ Protection: Condom     Comment: menarche age 38, P5 @ age 31,  last menstrual cycle 09/29/12   Other Topics Concern  . Not on file   Social History Narrative  . No narrative on file    Past Surgical Hx:  Past Surgical History  Procedure Laterality Date  . Cesarean section  1999  . Breast lumpectomy with needle localization and axillary sentinel lymph node bx Left 12/10/2012    Procedure: BREAST LUMPECTOMY WITH NEEDLE LOCALIZATION AND AXILLARY SENTINEL LYMPH NODE BX;  Surgeon: Merrie Roof, MD;  Location: [facility];  Service: General;  Laterality: Left;  Needle loc BCG 7:30  nuc med 9:30    . Tonsillectomy and adenoidectomy      as a teenager  . Cholecystectomy  1999  . Open reduction internal fixation (orif) tibia/fibula fracture Right   . Portacath placement Right 01/09/2013    Procedure: INSERTION PORT-A-CATH;  Surgeon: Merrie Roof, MD;  Location: [facility];  Service: General;  Laterality: Right;    Past Medical Hx:  Past Medical History  Diagnosis Date  . Urinary incontinence     occasional  . Family history of anesthesia complication     pt's mother has hx. of post-op N/V  . Hypertension     under control with med., has been on med. since 09/2012  . Anxiety   . Breast cancer 10/16/12    left, lower medial, 8 o'clock  . History of radiation therapy 07/06/13-08/20/13    left breast/    Past Gynecological History:  G1 (cesarean), HRHPV positive on screening pap (normal cytology) in July 2014, positive HRHPV on  pap 8/15)  No LMP recorded. Patient is not currently having periods (Reason: Chemotherapy).  Family Hx:  Family History  Problem Relation Age of Onset  . Breast cancer Maternal Grandmother   . Lung cancer Paternal Grandmother   . Anesthesia problems Mother     post-op N/V    Review of Systems:  Constitutional  Feels well,    ENT Normal appearing ears and nares bilaterally Skin/Breast  No rash, sores, jaundice, itching, dryness Cardiovascular  No chest pain, shortness of breath, or edema  Pulmonary  No cough or wheeze.  Gastro Intestinal  No nausea, vomitting, or diarrhoea. No bright red blood per rectum, no abdominal pain, change in bowel movement, or constipation.  Genito Urinary  No frequency, urgency, dysuria Musculo Skeletal  No myalgia, arthralgia, joint swelling or pain  Neurologic  No weakness, numbness, change in gait,  Psychology  No depression, anxiety, insomnia.   Vitals:  Blood pressure 143/86, pulse 91, temperature 98.3 F (36.8 C), temperature source Oral, resp. rate 20, height _0  (1.727 m), weight 243 lb 12.8 oz (110.587  kg).  Physical Exam: WD in NAD Neck  Supple NROM, without any enlargements.  Lymph Node Survey No cervical supraclavicular or inguinal adenopathy Cardiovascular  Pulse normal rate, regularity and rhythm. S1 and S2 normal.  Lungs  Clear to auscultation bilateraly, without wheezes/crackles/rhonchi. Good air movement.  Skin  No rash/lesions/breakdown  Psychiatry  Alert and oriented to person, place, and time  Abdomen  Normoactive bowel sounds, abdomen soft, non-tender and obese without evidence of hernia. Well healed cholecystectomy and c section incision. Back No CVA tenderness Genito Urinary  Vulva/vagina: Normal external female genitalia.  No lesions. No discharge or bleeding.  Bladder/urethra:  No lesions or masses, well supported bladder  Vagina: normal  Cervix: Normal appearing, no lesions.  Uterus: Small, mobile, no  parametrial involvement or nodularity.  Adnexa: no palpable masses. Rectal  Good tone, no masses no cul de sac nodularity.  Extremities  No bilateral cyanosis, clubbing or edema.  PROCEDURE: COLPOSCOPY 4% acetic acid was applied to the patient's cervix. The colposcope was used to magnify the view of the cervix. There were some areas of prominent vascularity seen on the posterior lip. These were biopsied. The entire TZ was visualized and felt to appear normal. An ECC was performed. Monsels was used for hemostasis at the biopsy site.   Theresa Eva, MD   01/14/2014, 3:22 PM

## 2014-01-26 NOTE — Anesthesia Preprocedure Evaluation (Signed)
Anesthesia Evaluation  Patient identified by MRN, date of birth, ID band Patient awake    Reviewed: Allergy & Precautions, H&P , NPO status , Patient's Chart, lab work & pertinent test results  History of Anesthesia Complications (+) Family history of anesthesia reaction  Airway Mallampati: II       Dental   Pulmonary former smoker,  breath sounds clear to auscultation        Cardiovascular hypertension, DVT Rhythm:Regular Rate:Normal     Neuro/Psych PSYCHIATRIC DISORDERS Anxiety    GI/Hepatic negative GI ROS, Neg liver ROS,   Endo/Other  Morbid obesity  Renal/GU negative Renal ROS     Musculoskeletal   Abdominal (+) + obese,   Peds  Hematology   Anesthesia Other Findings   Reproductive/Obstetrics                             Anesthesia Physical  Anesthesia Plan  ASA: III  Anesthesia Plan: General   Post-op Pain Management:    Induction: Intravenous  Airway Management Planned: Oral ETT  Additional Equipment: None  Intra-op Plan:   Post-operative Plan: Extubation in OR  Informed Consent: I have reviewed the patients History and Physical, chart, labs and discussed the procedure including the risks, benefits and alternatives for the proposed anesthesia with the patient or authorized representative who has indicated his/her understanding and acceptance.   Dental advisory given  Plan Discussed with: CRNA  Anesthesia Plan Comments:         Anesthesia Quick Evaluation

## 2014-01-26 NOTE — Progress Notes (Signed)
Pt up ambulated to bathroom with stand by assist x2.  Pt tolerated well.

## 2014-01-26 NOTE — Discharge Instructions (Signed)
01/26/2014  Return to work: 2 - 4 weeks  Activity: 1. Be up and out of the bed during the day.  Take a nap if needed.  You may walk up steps but be careful and use the hand rail.  Stair climbing will tire you more than you think, you may need to stop part way and rest.   2. No lifting or straining for 6 weeks.  3.   Do Not drive if you are taking narcotic pain medicine.  4. Shower daily.  Use soap and water on your incision and pat dry; don't rub.   5. No sexual activity and nothing in the vagina for 6 weeks.  Diet: 1. Low sodium Heart Healthy Diet is recommended.  2. It is safe to use a laxative if you have difficulty moving your bowels.   Wound Care: 1. Keep clean and dry.  Shower daily.  Reasons to call the Doctor:   Fever - Oral temperature greater than 100.4 degrees Fahrenheit  Foul-smelling vaginal discharge  Difficulty urinating  Nausea and vomiting  Increased pain at the site of the incision that is unrelieved with pain medicine.  Difficulty breathing with or without chest pain  New calf pain especially if only on one side  Sudden, continuing increased vaginal bleeding with or without clots.     Contacts: For questions or concerns you should contact:  Dr. Lahoma Crocker at [phone]  Dr. Nancy Marus at [facility] [phone]  Bilateral Salpingo-Oophorectomy, Care After Refer to this sheet in the next few weeks. These instructions provide you with information on caring for yourself after your procedure. Your health care provider may also give you more specific instructions. Your treatment has been planned according to current medical practices, but problems sometimes occur. Call your health care provider if you have any problems or questions after your procedure. WHAT TO EXPECT AFTER THE PROCEDURE After your procedure, it is typical to have the following:   Abdominal pain that can be controlled with medicine.  Vaginal  spotting.  Constipation.  Menopausal symptoms such as hot flashes, vaginal dryness, and mood swings. HOME CARE INSTRUCTIONS   Get plenty of rest and sleep.  Only take over-the-counter or prescription medicines as directed by your health care provider. Do not take aspirin. It can cause bleeding.  Keep incision areas clean and dry. Remove or change bandages (dressings) only as directed by your health care provider.  Take showers instead of baths for a few weeks as directed by your health care provider.  Limit exercise and activities as directed by your health care provider. Do not lift anything heavier than 5 pounds (2.3 kg) until your health care provider approves.  Do not drive until your health care provider approves.  Follow your health care provider's advice regarding diet. You may be able to resume your usual diet right away.  Drink enough fluids to keep your urine clear or pale yellow.  Do not douche, use tampons, or have sexual intercourse for 6 weeks after the procedure.  Do not drink alcohol until your health care provider says it is okay.  Take your temperature twice a day and write it down.  If you become constipated, you may:  Ask your health care provider about taking a mild laxative.  Add more fruit and bran to your diet.  Drink more fluids.  Follow up with your health care provider as directed. SEEK MEDICAL CARE IF:   You have swelling, redness, or increasing pain in the incision area.  You see pus coming from the incision area.  You notice a bad smell coming from the wound or dressing.  You have pain, redness, or swelling where the IV access tube was placed.  Your incision is breaking open (the edges are not staying together).  You feel dizzy or feel like fainting.  You develop pain or bleeding when you urinate.  You develop diarrhea.  You develop nausea and vomiting.  You develop abnormal vaginal discharge.  You develop a rash.  You have  pain that is not controlled with medicine. SEEK IMMEDIATE MEDICAL CARE IF:   You develop a fever.  You develop abdominal pain.  You have chest pain.  You develop shortness of breath.  You pass out.  You develop pain, swelling, or redness in your leg.  You develop heavy vaginal bleeding with or without blood clots. Document Released: 03/05/2005 Document Revised: 11/05/2012 Document Reviewed: 08/27/2012 [facility] Patient Information 2015 [place], [place]. This information is not intended to replace advice given to you by your health care provider. Make sure you discuss any questions you have with your health care provider. Loop Electrosurgical Excision Procedure Care After Refer to this sheet in the next few weeks. These instructions provide you with information on caring for yourself after your procedure. Your caregiver may also give you more specific instructions. Your treatment has been planned according to current medical practices, but problems sometimes occur. Call your caregiver if you have any problems or questions after your procedure. HOME CARE INSTRUCTIONS   Do not use tampons, douche, or have sexual intercourse for 2 weeks or as directed by your caregiver.  Begin normal activities if you have no or minimal cramping or bleeding, unless directed otherwise by your caregiver.  Take your temperature if you feel sick. Write down your temperature on paper, and tell your caregiver if you have a fever.  Take all medicines as directed by your caregiver.  Keep all your follow-up appointments and Pap tests as directed by your caregiver. SEEK IMMEDIATE MEDICAL CARE IF:   You have bleeding that is heavier or longer than a normal menstrual cycle.  You have bleeding that is bright red.  You have blood clots.  You have a fever.  You have increasing cramps or pain not relieved by medicine.  You develop abdominal pain that does not seem to be related to the same area of earlier  cramping and pain.  You are lightheaded, unusually weak, or faint.  You develop painful or bloody urination.  You develop a bad smelling vaginal discharge. MAKE SURE YOU:  Understand these instructions.  Will watch your condition.  Will get help right away if you are not doing well or get worse. Document Released: 11/16/2010 Document Revised: 05/28/2011 Document Reviewed: 11/16/2010 [facility] Patient Information 2015 [place], [place]. This information is not intended to replace advice given to you by your health care provider. Make sure you discuss any questions you have with your health care provider. General Anesthesia, Care After Refer to this sheet in the next few weeks. These instructions provide you with information on caring for yourself after your procedure. Your health care provider may also give you more specific instructions. Your treatment has been planned according to current medical practices, but problems sometimes occur. Call your health care provider if you have any problems or questions after your procedure. WHAT TO EXPECT AFTER THE PROCEDURE After the procedure, it is typical to experience:  Sleepiness.  Nausea and vomiting. HOME CARE INSTRUCTIONS  For the  first 24 hours after general anesthesia:  Have a responsible person with you.  Do not drive a car. If you are alone, do not take public transportation.  Do not drink alcohol.  Do not take medicine that has not been prescribed by your health care provider.  Do not sign important papers or make important decisions.  You may resume a normal diet and activities as directed by your health care provider.  Change bandages (dressings) as directed.  If you have questions or problems that seem related to general anesthesia, call the hospital and ask for the anesthetist or anesthesiologist on call. SEEK MEDICAL CARE IF:  You have nausea and vomiting that continue the day after anesthesia.  You develop a  rash. SEEK IMMEDIATE MEDICAL CARE IF:   You have difficulty breathing.  You have chest pain.  You have any allergic problems. Document Released: 06/11/2000 Document Revised: 03/10/2013 Document Reviewed: 09/18/2012 [facility] Patient Information 2015 [place], [place]. This information is not intended to replace advice given to you by your health care provider. Make sure you discuss any questions you have with your health care provider.

## 2014-01-27 ENCOUNTER — Encounter (HOSPITAL_COMMUNITY): Payer: Self-pay | Admitting: Gynecologic Oncology

## 2014-02-03 ENCOUNTER — Telehealth: Payer: Self-pay | Admitting: Gynecologic Oncology

## 2014-02-03 NOTE — Telephone Encounter (Signed)
Spoke with patient about post-op status.  Doing well.  Informed of path report.  Advised to call for any questions or concerns.

## 2014-02-10 ENCOUNTER — Telehealth: Payer: Self-pay | Admitting: Nurse Practitioner

## 2014-02-10 NOTE — Telephone Encounter (Signed)
, °

## 2014-02-24 ENCOUNTER — Encounter: Payer: Self-pay | Admitting: Gynecologic Oncology

## 2014-02-24 ENCOUNTER — Ambulatory Visit: Payer: BC Managed Care – PPO | Attending: Gynecologic Oncology | Admitting: Gynecologic Oncology

## 2014-02-24 VITALS — BP 142/92 | HR 84 | Temp 98.3°F | Resp 18 | Ht 68.0 in | Wt 240.7 lb

## 2014-02-24 DIAGNOSIS — C50312 Malignant neoplasm of lower-inner quadrant of left female breast: Secondary | ICD-10-CM

## 2014-02-24 DIAGNOSIS — Z87891 Personal history of nicotine dependence: Secondary | ICD-10-CM | POA: Diagnosis not present

## 2014-02-24 DIAGNOSIS — I10 Essential (primary) hypertension: Secondary | ICD-10-CM | POA: Diagnosis not present

## 2014-02-24 DIAGNOSIS — Z170 Estrogen receptor positive status [ER+]: Secondary | ICD-10-CM | POA: Insufficient documentation

## 2014-02-24 DIAGNOSIS — Z90722 Acquired absence of ovaries, bilateral: Secondary | ICD-10-CM | POA: Diagnosis not present

## 2014-02-24 DIAGNOSIS — Z9079 Acquired absence of other genital organ(s): Secondary | ICD-10-CM | POA: Diagnosis not present

## 2014-02-24 DIAGNOSIS — C50919 Malignant neoplasm of unspecified site of unspecified female breast: Secondary | ICD-10-CM | POA: Insufficient documentation

## 2014-02-24 DIAGNOSIS — Z923 Personal history of irradiation: Secondary | ICD-10-CM | POA: Diagnosis not present

## 2014-02-24 DIAGNOSIS — Z9221 Personal history of antineoplastic chemotherapy: Secondary | ICD-10-CM | POA: Insufficient documentation

## 2014-02-24 DIAGNOSIS — F419 Anxiety disorder, unspecified: Secondary | ICD-10-CM | POA: Insufficient documentation

## 2014-02-24 DIAGNOSIS — Z86718 Personal history of other venous thrombosis and embolism: Secondary | ICD-10-CM | POA: Diagnosis not present

## 2014-02-24 DIAGNOSIS — N870 Mild cervical dysplasia: Secondary | ICD-10-CM | POA: Insufficient documentation

## 2014-02-24 DIAGNOSIS — Z79899 Other long term (current) drug therapy: Secondary | ICD-10-CM | POA: Diagnosis not present

## 2014-02-24 NOTE — Patient Instructions (Signed)
Plan to follow up with Dr. Sherre Lain in one year for a pap smear with HPV testing.  Please call for any questions or concerns.  Happy Holidays!

## 2014-02-24 NOTE — Progress Notes (Signed)
Consult Note: Gyn-Onc  Consult was requested by Dr. Jana Hakim for the evaluation of [name] 38 y.o. female with estrogen receptor positive breast cancer desiring surgical menopause for therapeutic indication.  CC:  Chief Complaint  Patient presents with  . 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 Breast Cancer    Assessment/Plan:  [name]  is a 38 y.o.  year old who was initially seen in consultation at the request of Dr. Jana Hakim for evaluation for therapeutic surgical menopause as part of the treatment of premenopausal estrogen, progesterone receptor positive stage II breast cancer. She is s/p BSO and is doing well with the normal increase in vasomotor symptoms. Strategies were discussed. She also underwent a LEEP for cervical dysplasia that was negative. She'll need to return to see Dr. Christene Lye in [place] who is her regular gynecologist for a repeat pap smear with HPV typing in one year. She knows that we will be happy to see her in the future should the need arise.  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 She underwent a course of therapy with left breast lumpectomy and left axillary sentinel lymph node biopsy, adjuvant chemotherapy with 5-FU, epirubicin, and Cytoxan, weekly Taxol, adjuvant radiation to the left breast and regional lymph nodes, goserelin and anastrozole. 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 She received Xarelto therapy until May 2014 at which time it was discontinued when ultrasound confirmed no visualized thrombosis. She is experiencing significant dissatisfaction with her monthly goserelin injection and desires definitive surgical menopause.  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 She is no other history of abnormal Pap smears, or cervical procedures. She has been amenorrheic since September of 2014 during chemotherapy treatment (including no resumption of menses after completion of chemotherapy). Repeat pap on 8/15 showed normal cytology and positive HRHPV.   Interval History: On 01/26/14: Laparoscopic bilateral salpingo-oophorectomy and LEEP. Pathology: Bilateral tubes and ovaries, Cervical LEEP  Operative findings: Adhesions of the uterus to the anterior abdominal wall from her cesarean section. Adhesions in the RUQ from her cholecystectomy. Normal adnexa. Cervix without gross visual lesions.  1. Ovary and fallopian tube, right - BENIGN OVARIAN TISSUE WITH STROMAL HYPERPLASIA AND ENDOSALPINGIOSIS, NO ATYPIA OR MALIGNANCY. - COMPLETE CROSS SECTIONS OF FALLOPIAN TUBAL TISSUE AND NO ATYPIA, HYPERPLASIA, OR MALIGNANCY. 2. Ovary and fallopian tube, left - BENIGN OVARIAN TISSUE WITH SMALL BENIGN FIBROMA AND ASSOCIATED ENDOMETRIOSIS, NO ATYPIA OR MALIGNANCY. - COMPLETE CROSS SECTIONS OF BENIGN FALLOPIAN TUBAL TISSUE, NO ATYPIA OR MALIGNANCY. 3. Cervix, LEEP - PARTIALLY DENUDED SQUAMOUS EPITHELIUM WITH KOILOCYTIC ATYPIA AND PREVIOUS BIOPSY SITE CHANGES. - INKED ENDOCERVICAL AND EXOCERVICAL MARGINS, NEGATIVE FOR DYSPLASIA OR  MALIGNANCY.  She comes in today for her postoperative check. She's overall doing quite well. She does notice an increase in her hot flashes since her surgery. She otherwise really has no complaints. Current Meds:  Outpatient Encounter Prescriptions as of 02/24/2014  Medication Sig  . anastrozole (ARIMIDEX) 1 MG tablet Take 1 mg by mouth every morning.  Marland Kitchen b complex vitamins tablet Take 1 tablet by mouth every morning.  Marland Kitchen BIOTIN PO Take 1 tablet by mouth every morning.  . Cholecalciferol (VITAMIN D PO) Take 1 tablet  by mouth every morning.  Marland Kitchen lisinopril (PRINIVIL,ZESTRIL) 10 MG tablet Take 10 mg by mouth every morning.  Marland Kitchen  LORazepam (ATIVAN) 0.5 MG tablet Take 1 tablet (0.5 mg total) by mouth at bedtime as needed (Nausea or vomiting).  . ALPRAZolam (XANAX) 0.25 MG tablet Take 1 tablet (0.25 mg total) by mouth at bedtime as needed for sleep. (Patient not taking: Reported on 02/24/2014)  . [DISCONTINUED] enoxaparin (LOVENOX) 40 MG/0.4ML injection Inject 0.4 mLs (40 mg total) into the skin daily.  . [DISCONTINUED] oxyCODONE-acetaminophen (PERCOCET) 10-325 MG per tablet Take 1 tablet by mouth every 4 (four) hours as needed for pain.    Allergy:  Allergies  Allergen Reactions  . Amoxicillin Other (See Comments)    States "it does not work"    Social Hx:   History   Social History  . Marital Status: Divorced    Spouse Name: N/A    Number of Children: N/A  . Years of Education: N/A   Occupational History  . Not on file.   Social History Main Topics  . Smoking status: Former Smoker    Quit date: 12/03/2012  . Smokeless tobacco: Never Used  . Alcohol Use: Yes     Comment: occasionally  . Drug Use: No  . Sexual Activity: Yes    Birth Control/ Protection: Condom     Comment: menarche age 75, P57 @ age 28,  last menstrual cycle 09/29/12   Other Topics Concern  . Not on file   Social History Narrative    Past Surgical Hx:  Past Surgical History  Procedure Laterality Date  . Cesarean section  1999  . Breast lumpectomy with needle localization and axillary sentinel lymph node bx Left 12/10/2012    Procedure: BREAST LUMPECTOMY WITH NEEDLE LOCALIZATION AND AXILLARY SENTINEL LYMPH NODE BX;  Surgeon: Merrie Roof, MD;  Location: [facility];  Service: General;  Laterality: Left;  Needle loc BCG 7:30  nuc med 9:30    . Tonsillectomy and adenoidectomy      as a teenager  . Cholecystectomy  1999  . Open reduction internal fixation (orif) tibia/fibula fracture Right   . Portacath placement Right 01/09/2013    Procedure: INSERTION PORT-A-CATH;  Surgeon: Merrie Roof, MD;  Location: [facility];   Service: General;  Laterality: Right;  . Port-a-cath removal    . Robotic assisted bilateral salpingo oopherectomy Bilateral 01/26/2014    Procedure: ROBOTIC ASSISTED BILATERAL SALPINGO OOPHORECTOMY;  Surgeon: Imagene Gurney A. Alycia Rossetti, MD;  Location: [facility];  Service: Gynecology;  Laterality: Bilateral;  . Leep N/A 01/26/2014    Procedure: LOOP ELECTROSURGICAL EXCISION PROCEDURE (LEEP);  Surgeon: Lucita Lora. Alycia Rossetti, MD;  Location: [facility];  Service: Gynecology;  Laterality: N/A;    Past Medical Hx:  Past Medical History  Diagnosis Date  . Hypertension     under control with med., has been on med. since 09/2012  . Anxiety   . Breast cancer 10/16/12    left, lower medial, 8 o'clock  . History of radiation therapy 07/06/13-08/20/13    left breast/  . Family history of anesthesia complication     pt's mother has hx. of post-op N/V  . Pain in the wrist     BILATERAL  . History of transfusion   . DVT (deep venous thrombosis) 02/2013    RT LEG     Past Gynecological History:  G1 (cesarean), HRHPV positive on screening pap (normal cytology) in July 2014, positive HRHPV on pap 8/15)  No LMP recorded. Patient is not currently having periods (Reason: Chemotherapy).  Family Hx:  Family History  Problem Relation Age of Onset  . Breast cancer Maternal Grandmother   . Lung cancer Paternal Grandmother   . Anesthesia problems Mother     post-op N/V    Vitals:  Blood pressure 142/92, pulse 84, temperature 98.3 F (36.8 C), temperature source Oral, resp. rate 18, height _0  (1.727 m), weight 240 lb 11.2 oz (109.181 kg).  Physical Exam: WD in NAD  Abdomen: Soft, non-tender and obese without evidence of hernia. Well healed surgical incisions.  Genito Urinary:  Vulva/vagina: Normal external female genitalia.  No lesions. No discharge or bleeding.  Cervix: s/p LEEP, healing well   Kaushal Vannice A., MD   02/24/2014, 11:05 AM

## 2014-04-13 ENCOUNTER — Other Ambulatory Visit: Payer: Self-pay | Admitting: *Deleted

## 2014-04-13 DIAGNOSIS — C50312 Malignant neoplasm of lower-inner quadrant of left female breast: Secondary | ICD-10-CM

## 2014-04-14 ENCOUNTER — Telehealth: Payer: Self-pay | Admitting: Nurse Practitioner

## 2014-04-14 ENCOUNTER — Other Ambulatory Visit (HOSPITAL_BASED_OUTPATIENT_CLINIC_OR_DEPARTMENT_OTHER): Payer: BLUE CROSS/BLUE SHIELD

## 2014-04-14 ENCOUNTER — Ambulatory Visit (HOSPITAL_BASED_OUTPATIENT_CLINIC_OR_DEPARTMENT_OTHER): Payer: BLUE CROSS/BLUE SHIELD | Admitting: Nurse Practitioner

## 2014-04-14 ENCOUNTER — Ambulatory Visit: Payer: BC Managed Care – PPO | Admitting: Adult Health

## 2014-04-14 VITALS — BP 142/75 | HR 95 | Temp 98.4°F | Resp 18 | Ht 68.0 in | Wt 246.8 lb

## 2014-04-14 DIAGNOSIS — Z86718 Personal history of other venous thrombosis and embolism: Secondary | ICD-10-CM

## 2014-04-14 DIAGNOSIS — C50312 Malignant neoplasm of lower-inner quadrant of left female breast: Secondary | ICD-10-CM

## 2014-04-14 DIAGNOSIS — G4700 Insomnia, unspecified: Secondary | ICD-10-CM

## 2014-04-14 DIAGNOSIS — Z170 Estrogen receptor positive status [ER+]: Secondary | ICD-10-CM

## 2014-04-14 DIAGNOSIS — F419 Anxiety disorder, unspecified: Secondary | ICD-10-CM

## 2014-04-14 DIAGNOSIS — M25539 Pain in unspecified wrist: Secondary | ICD-10-CM

## 2014-04-14 DIAGNOSIS — C50319 Malignant neoplasm of lower-inner quadrant of unspecified female breast: Secondary | ICD-10-CM

## 2014-04-14 LAB — CBC WITH DIFFERENTIAL/PLATELET
BASO%: 0.6 % (ref 0.0–2.0)
BASOS ABS: 0 10*3/uL (ref 0.0–0.1)
EOS%: 1.8 % (ref 0.0–7.0)
Eosinophils Absolute: 0.1 10*3/uL (ref 0.0–0.5)
HEMATOCRIT: 38.8 % (ref 34.8–46.6)
HEMOGLOBIN: 12.8 g/dL (ref 11.6–15.9)
LYMPH%: 26.2 % (ref 14.0–49.7)
MCH: 30.3 pg (ref 25.1–34.0)
MCHC: 33.1 g/dL (ref 31.5–36.0)
MCV: 91.8 fL (ref 79.5–101.0)
MONO#: 0.5 10*3/uL (ref 0.1–0.9)
MONO%: 6.3 % (ref 0.0–14.0)
NEUT#: 5.2 10*3/uL (ref 1.5–6.5)
NEUT%: 65.1 % (ref 38.4–76.8)
Platelets: 254 10*3/uL (ref 145–400)
RBC: 4.23 10*6/uL (ref 3.70–5.45)
RDW: 13.8 % (ref 11.2–14.5)
WBC: 8 10*3/uL (ref 3.9–10.3)
lymph#: 2.1 10*3/uL (ref 0.9–3.3)

## 2014-04-14 LAB — COMPREHENSIVE METABOLIC PANEL (CC13)
ALBUMIN: 3.7 g/dL (ref 3.5–5.0)
ALT: 31 U/L (ref 0–55)
AST: 21 U/L (ref 5–34)
Alkaline Phosphatase: 110 U/L (ref 40–150)
Anion Gap: 11 mEq/L (ref 3–11)
BUN: 13.6 mg/dL (ref 7.0–26.0)
CALCIUM: 8.9 mg/dL (ref 8.4–10.4)
CO2: 25 mEq/L (ref 22–29)
Chloride: 103 mEq/L (ref 98–109)
Creatinine: 0.7 mg/dL (ref 0.6–1.1)
GLUCOSE: 137 mg/dL (ref 70–140)
Potassium: 3.9 mEq/L (ref 3.5–5.1)
Sodium: 139 mEq/L (ref 136–145)
TOTAL PROTEIN: 6.6 g/dL (ref 6.4–8.3)
Total Bilirubin: 0.8 mg/dL (ref 0.20–1.20)

## 2014-04-14 MED ORDER — ALPRAZOLAM 0.25 MG PO TABS: 0.2500 mg | ORAL_TABLET | Freq: Every evening | ORAL | Status: DC | PRN

## 2014-04-14 MED ORDER — LETROZOLE 2.5 MG PO TABS: 2.5000 mg | ORAL_TABLET | Freq: Every day | ORAL | Status: DC

## 2014-04-14 MED ORDER — LORAZEPAM 0.5 MG PO TABS: 0.5000 mg | ORAL_TABLET | Freq: Every evening | ORAL | Status: DC | PRN

## 2014-04-14 NOTE — Telephone Encounter (Signed)
, °

## 2014-04-14 NOTE — Progress Notes (Signed)
ID: [name] OB: [birth year]  MR#: [id number]  YIF#:[id number]  PCP: [name] GYN:   SU: Dr. Autumn Messing OTHER MD: Dr. 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 An ultrasound on 10/06/2012 showed a 1.6 x 1.3 x 1.3 cm mass at the 8 o'clock position 7 cm from the nipple. At the 12 o'clock position behind and nipple was a circumscribed oval shaped 2 x 1.8 x 1.2 cm lesion perhaps representing a fibroadenoma. 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 The carcinoma was grade 2-3, estrogen receptor 80% positive, progesterone receptor 80% positive, Ki-67 25%, and HER-2/neu negative. Bilateral breast MRI on 10/22/2012 showed biopsy proven carcinoma in the left lower inner quadrant identified and measures approximately 1.9 x 1.9 x 1.8 cm. Well-defined nodule in the right upper outer quadrant posteriorly consistent with a fibroadenoma. Probable liver cysts.There were multiple scattered nonspecific foci in each breast. No other suspicious abnormality was identified. No abnormal appearing lymph nodes.   The patient's subsequent history is as detailed below  INTERVAL HISTORY:  Kenetra returns today for follow up of her breast cancer. She has been on anastrozole since May 2015 and is not tolerating it well. Her main complaint is wrist stiffness, especially in the morning, that makes it difficult to perform her ADLs including getting dressed in the morning. She has been late to work several times in the past months because of this difficulty. Eventually it gets better during the day. She has terrible hot  flashes, but she is status post a salpingo oopherectomy on 01/26/14 and was told that these will improve in a few months. The interval history is otherwise unremarkable.    REVIEW OF SYSTEMS:  A detailed review of systems was conducted and is otherwise negative except for what is noted above.     PAST MEDICAL HISTORY: Past Medical History  Diagnosis Date  . Hypertension     under control with med., has been on med. since 09/2012  . Anxiety   . Breast cancer 10/16/12    left, lower medial, 8 o'clock  . History of radiation therapy 07/06/13-08/20/13    left breast/  . Family history of anesthesia complication     pt's mother has hx. of post-op N/V  . Pain in the wrist     BILATERAL  . History of transfusion   . DVT (deep venous thrombosis) 02/2013    RT LEG     PAST SURGICAL HISTORY: Past Surgical History  Procedure Laterality Date  . Cesarean section  1999  . Breast lumpectomy with needle localization and axillary sentinel lymph node bx Left 12/10/2012    Procedure: BREAST LUMPECTOMY WITH NEEDLE LOCALIZATION AND AXILLARY SENTINEL LYMPH NODE BX;  Surgeon: Merrie Roof, MD;  Location: [facility];  Service: General;  Laterality: Left;  Needle loc BCG 7:30  nuc med 9:30    . Tonsillectomy and adenoidectomy      as a teenager  . Cholecystectomy  1999  . Open reduction internal fixation (orif) tibia/fibula fracture Right   . Portacath placement Right 01/09/2013    Procedure: INSERTION PORT-A-CATH;  Surgeon: Merrie Roof, MD;  Location: [facility];  Service: General;  Laterality: Right;  . Port-a-cath removal    . Robotic assisted bilateral salpingo oopherectomy Bilateral 01/26/2014    Procedure: ROBOTIC ASSISTED BILATERAL SALPINGO OOPHORECTOMY;  Surgeon: Imagene Gurney A. Alycia Rossetti, MD;  Location: [facility];  Service: Gynecology;  Laterality: Bilateral;  . Leep N/A 01/26/2014    Procedure: LOOP ELECTROSURGICAL EXCISION PROCEDURE (LEEP);  Surgeon: Lucita Lora. Alycia Rossetti, MD;  Location: [facility];   Service: Gynecology;  Laterality: N/A;    FAMILY HISTORY Family History  Problem Relation Age of Onset 39  . Breast cancer Maternal Grandmother   . Lung cancer Paternal Grandmother   . 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 She lives with her daughter [name] who is 7 years old as of August of 2015.    ADVANCED DIRECTIVES: (Updated 01/12/2014) Not in place. At her 10/20/2013 visit the patient was given the appropriate documents to complete and notarize at her discretion. She tells me she intends to name her mother, [name], as healthcare power of attorney. She can be reached at [phone]   HEALTH MAINTENANCE: History  Substance Use Topics  . Smoking status: Former Smoker    Quit date: 12/03/2012  . Smokeless tobacco: Never Used  . Alcohol Use: Yes     Comment: occasionally    Colonoscopy: n/a Pap Smear: 03/2013 Eye Exam: 08/2012 Vitamin D Level: pending Lipid Panel: not  Checked recently   Allergies  Allergen Reactions  . Amoxicillin Other (See Comments)    States "it does not work"    Current Outpatient Prescriptions  Medication Sig Dispense Refill  . b complex vitamins tablet Take 1 tablet by mouth every morning.    Marland Kitchen BIOTIN PO Take 1 tablet by mouth every morning.    . Cholecalciferol (VITAMIN D PO) Take 1 tablet by mouth every morning.    Marland Kitchen lisinopril (PRINIVIL,ZESTRIL) 10 MG tablet Take 10 mg by mouth every morning.    Marland Kitchen ALPRAZolam (XANAX) 0.25 MG tablet Take 1 tablet (0.25 mg total) by mouth at bedtime as needed for sleep. 30 tablet 0  . letrozole  (FEMARA) 2.5 MG tablet Take 1 tablet (2.5 mg total) by mouth daily. 30 tablet 3  . LORazepam (ATIVAN) 0.5 MG tablet Take 1 tablet (0.5 mg total) by mouth at bedtime as needed (Nausea or vomiting). 30 tablet 0   No current facility-administered medications for this visit.    OBJECTIVE: Young white female in no acute distress Filed Vitals:   04/14/14 1500  BP: 142/75  Pulse: 95  Temp: 98.4 F (36.9 C)  Resp: 18     Body mass index is 37.53 kg/(m^2).      Skin: warm, dry  HEENT: sclerae anicteric, conjunctivae pink, oropharynx clear. No thrush or mucositis.  Lymph Nodes: No cervical or supraclavicular lymphadenopathy  Lungs: clear to auscultation bilaterally, no rales, wheezes, or rhonci  Heart: regular rate and rhythm  Abdomen: round, soft, non tender, positive bowel sounds  Musculoskeletal: No focal spinal tenderness, no peripheral edema  Neuro: non focal, well oriented, positive affect  Breast: left breast status post lumpectomy and radiation. Thickening of  skin noted to inner breast from radiation. No evidence of recurrent disease. Bilateral axillae benign. Left breast unremarkable.  ECOG FS:1 - Symptomatic but completely ambulatory  LAB RESULTS:  CMP     Component Value Date/Time   NA 139 04/14/2014 1449   NA 137 01/21/2014 1530   K 3.9 04/14/2014 1449   K 3.4* 01/21/2014 1530   CL 100 01/21/2014 1530   CO2 25 04/14/2014 1449   CO2 27 01/21/2014 1530   GLUCOSE 137 04/14/2014 1449   GLUCOSE 121* 01/21/2014 1530   BUN 13.6 04/14/2014 1449   BUN 11 01/21/2014 1530   CREATININE 0.7 04/14/2014 1449   CREATININE 0.82 01/21/2014 1530   CALCIUM 8.9 04/14/2014 1449   CALCIUM 9.1 01/21/2014 1530   PROT 6.6 04/14/2014 1449   PROT 6.5 01/21/2014 1530   ALBUMIN 3.7 04/14/2014 1449   ALBUMIN 3.4* 01/21/2014 1530   AST 21 04/14/2014 1449   AST 19 01/21/2014 1530   ALT 31 04/14/2014 1449   ALT 22 01/21/2014 1530   ALKPHOS 110 04/14/2014 1449   ALKPHOS 99 01/21/2014 1530    BILITOT 0.80 04/14/2014 1449   BILITOT 0.5 01/21/2014 1530   GFRNONAA 90* 01/21/2014 1530   GFRAA >90 01/21/2014 1530    I No results found for: SPEP  Lab Results  Component Value Date   WBC 8.0 04/14/2014   NEUTROABS 5.2 04/14/2014   HGB 12.8 04/14/2014   HCT 38.8 04/14/2014   MCV 91.8 04/14/2014   PLT 254 04/14/2014      Chemistry      Component Value Date/Time   NA 139 04/14/2014 1449   NA 137 01/21/2014 1530   K 3.9 04/14/2014 1449   K 3.4* 01/21/2014 1530   CL 100 01/21/2014 1530   CO2 25 04/14/2014 1449   CO2 27 01/21/2014 1530   BUN 13.6 04/14/2014 1449   BUN 11 01/21/2014 1530   CREATININE 0.7 04/14/2014 1449   CREATININE 0.82 01/21/2014 1530      Component Value Date/Time   CALCIUM 8.9 04/14/2014 1449   CALCIUM 9.1 01/21/2014 1530   ALKPHOS 110 04/14/2014 1449   ALKPHOS 99 01/21/2014 1530   AST 21 04/14/2014 1449   AST 19 01/21/2014 1530   ALT 31 04/14/2014 1449   ALT 22 01/21/2014 1530   BILITOT 0.80 04/14/2014 1449   BILITOT 0.5 01/21/2014 1530       No results found for: LABCA2  No components found for: LABCA125  No results for input(s): INR in the last 168 hours.  Urinalysis    Component Value Date/Time   COLORURINE YELLOW 01/21/2014 1503   APPEARANCEUR CLEAR 01/21/2014 1503   LABSPEC 1.014 01/21/2014 1503   LABSPEC 1.010 07/16/2013 1538   PHURINE 6.5 01/21/2014 1503   GLUCOSEU NEGATIVE 01/21/2014 1503   GLUCOSEU Negative 07/16/2013 1538   HGBUR NEGATIVE 01/21/2014 1503   BILIRUBINUR NEGATIVE 01/21/2014 1503   KETONESUR NEGATIVE 01/21/2014 1503   PROTEINUR NEGATIVE 01/21/2014 1503   UROBILINOGEN 0.2 01/21/2014 1503   UROBILINOGEN 0.2 07/16/2013 1538   NITRITE NEGATIVE 01/21/2014 1503   LEUKOCYTESUR NEGATIVE 01/21/2014 1503    STUDIES: No results found.  Most recent mammogram on 11/17/13 was unremarkable  Most recent bone density scan on 08/19/13 showed a t-score of -.5 (normal)  ASSESSMENT: 39 y.o. BRCA negative [place],  [place]  status post left breast lumpectomy with left axillary sentinel lymph node biopsy 12/10/2012 for a pT2, pN23m, stage IIB invasive ductal carcinoma, grade 3, estrogen receptor 80%  positive, progesterone receptor 80% positive, Ki-67 25%, HER-2/neu negative  #1 Genetic testing performed in [place], [place] with self reported by the patient as BRCA1 and BRCA2 negative.   #2 s/p adjuvant chemotherapy consisting of FEC (5-FU/epirubicin/Cytoxan) begun on 01/23/2013,completed on 04/03/13 (six cycles).   #3 S/p adjuvant weekly Taxol beginning 04/17/13, received  7 of the 12 planned cycles, last dose 06/05/13 (stopped secondary to skin toxicity)  #6 adjuvant radiation to the left breast and regional lymph nodes completed 08/20/2013  #7 goserelin started 04/17/2013, continued monthly  #8 anastrozole started 08/14/2013; bone density 08/19/2013 was normal  - stopped 04/15/14 because of severe joint stiffness. Switched to letrozole starting 05/03/14  #6 Right lower extremity DVT diagnosed on 03/16/2013, patient was placed on Xarelto. Repeat venous Doppler performed in March 2015 was negative for any thrombosis.  Xarelto was stopped Aug 14, 2012.    PLAN:  Dicy and I discussed her complications with anastrozole, and it does not look like she will be able to tolerate this drug. 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 We discussed switching to letrozole instead. She understands that it is in the same drug class as anastrozole and they tend to carry the same list of side effects, however she may find that this formulation works better for her. I advised that she stop the anastrozole and wait 2-3 weeks or for resolution of symptoms before starting on letrozole.   I refilled her xanax which she uses for anxiety during the day, and ativan which she uses for sleep at night.  Tenya will return in 3 months for a follow up visit. She understands and agrees with this plan. She  knows the goal of treatment in her case is cure. She has been encouraged to call with any issues that might arise before her next visit here.  Marcelino Duster, [place] [phone] 04/14/2014 5:05 PM

## 2014-04-15 ENCOUNTER — Encounter: Payer: Self-pay | Admitting: Oncology

## 2014-04-15 ENCOUNTER — Encounter: Payer: Self-pay | Admitting: Nurse Practitioner

## 2014-04-15 ENCOUNTER — Telehealth: Payer: Self-pay | Admitting: *Deleted

## 2014-04-15 NOTE — Progress Notes (Signed)
[employer] [facility], [id number], approved letrozole from 04/15/14-04/16/15

## 2014-04-15 NOTE — Telephone Encounter (Signed)
[facility] faxed Prior authorization request for Letrozole.  Request to [employer] for review.

## 2014-05-13 ENCOUNTER — Other Ambulatory Visit: Payer: Self-pay

## 2014-05-13 DIAGNOSIS — C50312 Malignant neoplasm of lower-inner quadrant of left female breast: Secondary | ICD-10-CM

## 2014-05-13 MED ORDER — LISINOPRIL 10 MG PO TABS: 10.0000 mg | ORAL_TABLET | ORAL | Status: DC

## 2014-07-15 ENCOUNTER — Other Ambulatory Visit (HOSPITAL_BASED_OUTPATIENT_CLINIC_OR_DEPARTMENT_OTHER): Payer: BLUE CROSS/BLUE SHIELD

## 2014-07-15 ENCOUNTER — Encounter: Payer: Self-pay | Admitting: Nurse Practitioner

## 2014-07-15 ENCOUNTER — Other Ambulatory Visit: Payer: Self-pay | Admitting: *Deleted

## 2014-07-15 ENCOUNTER — Ambulatory Visit (HOSPITAL_BASED_OUTPATIENT_CLINIC_OR_DEPARTMENT_OTHER): Payer: BLUE CROSS/BLUE SHIELD | Admitting: Nurse Practitioner

## 2014-07-15 ENCOUNTER — Telehealth: Payer: Self-pay | Admitting: Nurse Practitioner

## 2014-07-15 VITALS — BP 129/79 | HR 85 | Temp 98.3°F | Resp 18 | Ht 68.0 in | Wt 257.4 lb

## 2014-07-15 DIAGNOSIS — N951 Menopausal and female climacteric states: Secondary | ICD-10-CM

## 2014-07-15 DIAGNOSIS — Z86718 Personal history of other venous thrombosis and embolism: Secondary | ICD-10-CM | POA: Diagnosis not present

## 2014-07-15 DIAGNOSIS — C50312 Malignant neoplasm of lower-inner quadrant of left female breast: Secondary | ICD-10-CM | POA: Diagnosis not present

## 2014-07-15 DIAGNOSIS — R232 Flushing: Secondary | ICD-10-CM | POA: Insufficient documentation

## 2014-07-15 DIAGNOSIS — C50319 Malignant neoplasm of lower-inner quadrant of unspecified female breast: Secondary | ICD-10-CM

## 2014-07-15 LAB — CBC WITH DIFFERENTIAL/PLATELET
BASO%: 0.3 % (ref 0.0–2.0)
Basophils Absolute: 0 10*3/uL (ref 0.0–0.1)
EOS%: 2.1 % (ref 0.0–7.0)
Eosinophils Absolute: 0.2 10*3/uL (ref 0.0–0.5)
HCT: 38.6 % (ref 34.8–46.6)
HGB: 13.4 g/dL (ref 11.6–15.9)
LYMPH%: 32.1 % (ref 14.0–49.7)
MCH: 32 pg (ref 25.1–34.0)
MCHC: 34.7 g/dL (ref 31.5–36.0)
MCV: 92.1 fL (ref 79.5–101.0)
MONO#: 0.5 10*3/uL (ref 0.1–0.9)
MONO%: 7 % (ref 0.0–14.0)
NEUT#: 4.4 10*3/uL (ref 1.5–6.5)
NEUT%: 58.5 % (ref 38.4–76.8)
Platelets: 225 10*3/uL (ref 145–400)
RBC: 4.19 10*6/uL (ref 3.70–5.45)
RDW: 13.2 % (ref 11.2–14.5)
WBC: 7.6 10*3/uL (ref 3.9–10.3)
lymph#: 2.4 10*3/uL (ref 0.9–3.3)

## 2014-07-15 LAB — COMPREHENSIVE METABOLIC PANEL (CC13)
ALT: 50 U/L (ref 0–55)
AST: 39 U/L — ABNORMAL HIGH (ref 5–34)
Albumin: 3.8 g/dL (ref 3.5–5.0)
Alkaline Phosphatase: 113 U/L (ref 40–150)
Anion Gap: 11 mEq/L (ref 3–11)
BILIRUBIN TOTAL: 0.7 mg/dL (ref 0.20–1.20)
BUN: 13 mg/dL (ref 7.0–26.0)
CO2: 25 mEq/L (ref 22–29)
Calcium: 9.2 mg/dL (ref 8.4–10.4)
Chloride: 104 mEq/L (ref 98–109)
Creatinine: 0.7 mg/dL (ref 0.6–1.1)
EGFR: >90 mL/min/{1.73_m2} (ref >90)
Glucose: 120 mg/dl (ref 70–140)
Potassium: 3.9 mEq/L (ref 3.5–5.1)
Sodium: 140 mEq/L (ref 136–145)
Total Protein: 6.7 g/dL (ref 6.4–8.3)

## 2014-07-15 MED ORDER — VENLAFAXINE HCL ER 37.5 MG PO CP24: 37.5000 mg | ORAL_CAPSULE | Freq: Every day | ORAL | Status: DC

## 2014-07-15 MED ORDER — LETROZOLE 2.5 MG PO TABS: 2.5000 mg | ORAL_TABLET | Freq: Every day | ORAL | Status: DC

## 2014-07-15 MED ORDER — ALPRAZOLAM 0.25 MG PO TABS: 0.2500 mg | ORAL_TABLET | Freq: Every evening | ORAL | Status: DC | PRN

## 2014-07-15 NOTE — Progress Notes (Signed)
ID: [name] OB: Apr 13, 1975  MR#: [id number]  SWN#:[id number]  PCP: [name] GYN:   SU: Dr. Autumn Messing OTHER MD: Dr. 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 An ultrasound on 10/06/2012 showed a 1.6 x 1.3 x 1.3 cm mass at the 8 o'clock position 7 cm from the nipple. At the 12 o'clock position behind and nipple was a circumscribed oval shaped 2 x 1.8 x 1.2 cm lesion perhaps representing a fibroadenoma. 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 The carcinoma was grade 2-3, estrogen receptor 80% positive, progesterone receptor 80% positive, Ki-67 25%, and HER-2/neu negative. Bilateral breast MRI on 10/22/2012 showed biopsy proven carcinoma in the left lower inner quadrant identified and measures approximately 1.9 x 1.9 x 1.8 cm. Well-defined nodule in the right upper outer quadrant posteriorly consistent with a fibroadenoma. Probable liver cysts.There were multiple scattered nonspecific foci in each breast. No other suspicious abnormality was identified. No abnormal appearing lymph nodes.   The patient's subsequent history is as detailed below  INTERVAL HISTORY:  Rebakah returns today for follow up of her breast cancer. At her last visit in February, she was switched from anastrozole to letrozole and she seems to be tolerating this much better. She has joint pain 2 or 3 times weekly, but it is not long standing or debilitating as it was on anastrozole. Her hot flashes in turn have increased and she would like assistance with this. Otherwise the interval history is generally unremarkable. She plans to move to  a new, less stressful job this Summer.   REVIEW OF SYSTEMS:  A detailed review of systems was conducted and is otherwise negative except for what is noted above.     PAST MEDICAL HISTORY: Past Medical History  Diagnosis Date  . Hypertension     under control with med., has been on med. since 09/2012  . Anxiety   . Breast cancer 10/16/12    left, lower medial, 8 o'clock  . History of radiation therapy 07/06/13-08/20/13    left breast/  . Family history of anesthesia complication     pt's mother has hx. of post-op N/V  . Pain in the wrist     BILATERAL  . History of transfusion   . DVT (deep venous thrombosis) 02/2013    RT LEG     PAST SURGICAL HISTORY: Past Surgical History  Procedure Laterality Date  . Cesarean section  1999  . Breast lumpectomy with needle localization and axillary sentinel lymph node bx Left 12/10/2012    Procedure: BREAST LUMPECTOMY WITH NEEDLE LOCALIZATION AND AXILLARY SENTINEL LYMPH NODE BX;  Surgeon: Merrie Roof, MD;  Location: [facility];  Service: General;  Laterality: Left;  Needle loc BCG 7:30  nuc med 9:30    . Tonsillectomy and adenoidectomy      as a teenager  . Cholecystectomy  1999  . Open reduction internal fixation (orif) tibia/fibula fracture Right   . Portacath placement Right 01/09/2013    Procedure: INSERTION PORT-A-CATH;  Surgeon: Merrie Roof, MD;  Location: [facility];  Service: General;  Laterality: Right;  . Port-a-cath removal    . Robotic assisted bilateral  salpingo oopherectomy Bilateral 01/26/2014    Procedure: ROBOTIC ASSISTED BILATERAL SALPINGO OOPHORECTOMY;  Surgeon: Imagene Gurney A. Alycia Rossetti, MD;  Location: [facility];  Service: Gynecology;  Laterality: Bilateral;  . Leep N/A 01/26/2014    Procedure: LOOP ELECTROSURGICAL EXCISION PROCEDURE (LEEP);  Surgeon: Lucita Lora. Alycia Rossetti, MD;  Location: [facility];  Service: Gynecology;  Laterality: N/A;    FAMILY HISTORY Family History  Problem Relation Age of Onset  . Breast cancer  Maternal Grandmother   . Lung cancer Paternal Grandmother   . 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 She lives with her daughter [name] who is 43 years old as of August of 2015.    ADVANCED DIRECTIVES: (Updated 01/12/2014) Not in place. At her 10/20/2013 visit the patient was given the appropriate documents to complete and notarize at her discretion. She tells me she intends to name her mother, [name], as healthcare power of attorney. She can be reached at [phone]   HEALTH MAINTENANCE: History  Substance Use Topics  . Smoking status: Former Smoker    Quit date: 12/03/2012  . Smokeless tobacco: Never Used  . Alcohol Use: Yes     Comment: occasionally    Colonoscopy: n/a Pap Smear: 03/2013 Eye Exam: 08/2012 Vitamin D Level: pending Lipid Panel: not  Checked recently   Allergies  Allergen Reactions  . Amoxicillin Other (See Comments)    States "it does not work"    Current Outpatient Prescriptions  Medication Sig Dispense Refill  . b complex vitamins tablet Take 1 tablet by mouth every morning.    Marland Kitchen BIOTIN PO Take 1 tablet by mouth every morning.    . Cholecalciferol (VITAMIN D PO) Take 1 tablet by mouth every morning.    Marland Kitchen letrozole (FEMARA) 2.5 MG tablet Take 1 tablet (2.5 mg total) by mouth daily. 30 tablet 3  . lisinopril (PRINIVIL,ZESTRIL) 10 MG tablet Take 1 tablet (10 mg total) by mouth every morning. 30 tablet 2  . LORazepam (ATIVAN) 0.5 MG tablet Take 1 tablet (0.5 mg total) by mouth at bedtime as needed (Nausea or vomiting). 30 tablet 0  .  ALPRAZolam (XANAX) 0.25 MG tablet Take 1 tablet (0.25 mg total) by mouth at bedtime as needed for sleep. 30 tablet 0  . venlafaxine XR (EFFEXOR-XR) 37.5 MG 24 hr capsule Take 1 capsule (37.5 mg total) by mouth daily with breakfast. 30 capsule 5   No current facility-administered medications for this visit.    OBJECTIVE: Young white female in no acute distress Filed Vitals:   07/15/14 1405  BP: 129/79  Pulse: 85  Temp: 98.3 F (36.8 C)  Resp: 18     Body mass index is 39.15 kg/(m^2).      Skin: warm, dry  HEENT: sclerae anicteric, conjunctivae pink, oropharynx clear. No thrush or mucositis.  Lymph Nodes: No cervical or supraclavicular lymphadenopathy  Lungs: clear to auscultation bilaterally, no rales, wheezes, or rhonci  Heart: regular rate and rhythm  Abdomen: round, soft, non tender, positive bowel sounds  Musculoskeletal: No focal spinal tenderness, no peripheral edema  Neuro: non focal, well oriented, positive affect  Breast:  deferred  ECOG FS:1 - Symptomatic but completely ambulatory  LAB RESULTS:  CMP     Component Value Date/Time   NA 140 07/15/2014 1355   NA 137 01/21/2014 1530   K 3.9 07/15/2014 1355   K 3.4* 01/21/2014 1530   CL 100 01/21/2014 1530   CO2 25 07/15/2014 1355   CO2 27 01/21/2014 1530   GLUCOSE 120 07/15/2014 1355   GLUCOSE 121* 01/21/2014 1530   BUN 13.0 07/15/2014 1355   BUN 11 01/21/2014 1530   CREATININE 0.7 07/15/2014 1355   CREATININE 0.82 01/21/2014 1530   CALCIUM 9.2 07/15/2014 1355   CALCIUM 9.1 01/21/2014 1530   PROT 6.7 07/15/2014 1355   PROT 6.5 01/21/2014 1530   ALBUMIN 3.8 07/15/2014 1355   ALBUMIN 3.4* 01/21/2014 1530   AST 39* 07/15/2014 1355   AST 19 01/21/2014 1530   ALT 50 07/15/2014 1355   ALT 22 01/21/2014 1530   ALKPHOS 113 07/15/2014 1355   ALKPHOS 99 01/21/2014 1530   BILITOT 0.70 07/15/2014 1355   BILITOT 0.5 01/21/2014 1530   GFRNONAA 90* 01/21/2014 1530   GFRAA >90 01/21/2014 1530    I No results found  for: SPEP  Lab Results  Component Value Date   WBC 7.6 07/15/2014   NEUTROABS 4.4 07/15/2014   HGB 13.4 07/15/2014   HCT 38.6 07/15/2014   MCV 92.1 07/15/2014   PLT 225 07/15/2014      Chemistry      Component Value Date/Time   NA 140 07/15/2014 1355   NA 137 01/21/2014 1530   K 3.9 07/15/2014 1355   K 3.4* 01/21/2014 1530   CL 100 01/21/2014 1530   CO2 25 07/15/2014 1355   CO2 27 01/21/2014 1530   BUN 13.0 07/15/2014 1355   BUN 11 01/21/2014 1530   CREATININE 0.7 07/15/2014 1355   CREATININE 0.82 01/21/2014 1530      Component Value Date/Time   CALCIUM 9.2 07/15/2014 1355   CALCIUM 9.1 01/21/2014 1530   ALKPHOS 113 07/15/2014 1355   ALKPHOS 99 01/21/2014 1530   AST 39* 07/15/2014 1355   AST 19 01/21/2014 1530   ALT 50 07/15/2014 1355   ALT 22 01/21/2014 1530   BILITOT 0.70 07/15/2014 1355   BILITOT 0.5 01/21/2014 1530       No results found for: LABCA2  No components found for: LABCA125  No results for input(s): INR in the last 168 hours.  Urinalysis    Component Value Date/Time   COLORURINE YELLOW 01/21/2014 1503   APPEARANCEUR CLEAR 01/21/2014 1503   LABSPEC 1.014 01/21/2014 1503   LABSPEC 1.010 07/16/2013 1538   PHURINE 6.5 01/21/2014 1503   GLUCOSEU NEGATIVE 01/21/2014 1503   GLUCOSEU Negative 07/16/2013 1538   HGBUR NEGATIVE 01/21/2014 1503   BILIRUBINUR NEGATIVE 01/21/2014 1503   KETONESUR NEGATIVE 01/21/2014 1503   PROTEINUR NEGATIVE 01/21/2014 1503   UROBILINOGEN 0.2 01/21/2014 1503   UROBILINOGEN 0.2 07/16/2013 1538   NITRITE NEGATIVE 01/21/2014 1503   NITRITE Negative 07/16/2013 1538   LEUKOCYTESUR NEGATIVE 01/21/2014 1503    STUDIES: No results found.  Most recent mammogram on 11/17/13 was unremarkable  Most recent bone density scan on 08/19/13 showed a t-score of -.5 (normal)  ASSESSMENT: 39 y.o. BRCA negative [place], [place]  status post left breast lumpectomy with left axillary sentinel lymph node biopsy 12/10/2012 for a  pT2, pN40mi, stage IIB invasive ductal carcinoma, grade 3, estrogen receptor 80% positive, progesterone receptor 80% positive, Ki-67 25%, HER-2/neu negative  #1  Genetic testing performed in [place], [place] with self reported by the patient as BRCA1 and BRCA2 negative.   #2 s/p adjuvant chemotherapy consisting of FEC (5-FU/epirubicin/Cytoxan) begun on 01/23/2013,completed on 04/03/13 (six cycles).   #3 S/p adjuvant weekly Taxol beginning 04/17/13, received  7 of the 12 planned cycles, last dose 06/05/13 (stopped secondary to skin toxicity)  #6 adjuvant radiation to the left breast and regional lymph nodes completed 08/20/2013  #7 goserelin started 04/17/2013, continued monthly  #8 anastrozole started 08/14/2013; bone density 08/19/2013 was normal  - stopped 04/15/14 because of severe joint stiffness. Switched to letrozole starting 05/03/14  #6 Right lower extremity DVT diagnosed on 03/16/2013, patient was placed on Xarelto. Repeat venous Doppler performed in March 2015 was negative for any thrombosis.  Xarelto was stopped Aug 14, 2012.    PLAN:  Zoraya is feeling much improved since switching to letrozole. She will simply manage the few joint aches she does have with aleve PRN. We discussed options for her hot flashes, and settled on venlafaxine ER 37.53m daily. I have sent that prescription to her pharmacy today, and I also refilled her xanax.   Taneya will be due for a repeat mammogram in September. She will follow up with labs and an office visit in 6 months. She understands and agrees with this plan. She knows the goal of treatment in her case is cure. She has been encouraged to call with any issues that might arise before her next visit here.    HLaurie Panda NP  07/15/2014 2:41 PM

## 2014-07-15 NOTE — Telephone Encounter (Signed)
Gave pt AVS, pt states she will c/b to schedule for her 6 month f/u per 04/28 POF..... KJ

## 2014-07-16 NOTE — Addendum Note (Signed)
Addended by: Marcelino Duster on: 07/16/2014 09:26 AM   Modules accepted: Orders

## 2014-08-23 ENCOUNTER — Other Ambulatory Visit: Payer: Self-pay | Admitting: Oncology

## 2014-09-29 ENCOUNTER — Telehealth: Payer: Self-pay | Admitting: Oncology

## 2014-09-29 NOTE — Telephone Encounter (Signed)
Patient called to schedule 71month. Patient confirmed appointment in November.

## 2014-11-12 ENCOUNTER — Other Ambulatory Visit: Payer: Self-pay | Admitting: Oncology

## 2014-11-12 DIAGNOSIS — R922 Inconclusive mammogram: Secondary | ICD-10-CM

## 2014-12-04 ENCOUNTER — Other Ambulatory Visit: Payer: Self-pay | Admitting: Oncology

## 2014-12-08 ENCOUNTER — Ambulatory Visit
Admission: RE | Admit: 2014-12-08 | Discharge: 2014-12-08 | Disposition: A | Discharge status: Home / Self Care | Payer: BLUE CROSS/BLUE SHIELD | Source: Ambulatory Visit | Attending: Oncology | Admitting: Oncology

## 2014-12-08 ENCOUNTER — Other Ambulatory Visit: Payer: Self-pay | Admitting: Oncology

## 2014-12-08 DIAGNOSIS — R922 Inconclusive mammogram: Secondary | ICD-10-CM

## 2014-12-13 ENCOUNTER — Other Ambulatory Visit: Payer: Self-pay

## 2014-12-14 ENCOUNTER — Ambulatory Visit
Admission: RE | Admit: 2014-12-14 | Discharge: 2014-12-14 | Disposition: A | Discharge status: Home / Self Care | Payer: BLUE CROSS/BLUE SHIELD | Source: Ambulatory Visit | Attending: Oncology | Admitting: Oncology

## 2014-12-14 ENCOUNTER — Other Ambulatory Visit: Payer: Self-pay | Admitting: Oncology

## 2014-12-14 DIAGNOSIS — R921 Mammographic calcification found on diagnostic imaging of breast: Secondary | ICD-10-CM

## 2014-12-14 DIAGNOSIS — R922 Inconclusive mammogram: Secondary | ICD-10-CM

## 2014-12-15 ENCOUNTER — Other Ambulatory Visit: Payer: Self-pay

## 2014-12-15 NOTE — Telephone Encounter (Signed)
Per [facility] pharmacist, they did not receive prescription for lisinopril.  Placed order verbally with pharmacist and let her know we have receipt confirmed - possible problem with surescripts.

## 2015-01-02 ENCOUNTER — Other Ambulatory Visit: Payer: Self-pay | Admitting: Nurse Practitioner

## 2015-01-03 ENCOUNTER — Other Ambulatory Visit: Payer: Self-pay | Admitting: *Deleted

## 2015-01-03 MED ORDER — LETROZOLE 2.5 MG PO TABS: 2.5000 mg | ORAL_TABLET | Freq: Every day | ORAL | Status: DC

## 2015-01-12 ENCOUNTER — Other Ambulatory Visit: Payer: Self-pay | Admitting: Nurse Practitioner

## 2015-01-27 ENCOUNTER — Telehealth: Payer: Self-pay | Admitting: Oncology

## 2015-01-27 ENCOUNTER — Ambulatory Visit (HOSPITAL_BASED_OUTPATIENT_CLINIC_OR_DEPARTMENT_OTHER): Payer: BLUE CROSS/BLUE SHIELD | Admitting: Oncology

## 2015-01-27 ENCOUNTER — Other Ambulatory Visit (HOSPITAL_BASED_OUTPATIENT_CLINIC_OR_DEPARTMENT_OTHER): Payer: BLUE CROSS/BLUE SHIELD

## 2015-01-27 VITALS — BP 132/82 | HR 74 | Temp 97.9°F | Resp 18 | Ht 68.0 in | Wt 254.7 lb

## 2015-01-27 DIAGNOSIS — C50319 Malignant neoplasm of lower-inner quadrant of unspecified female breast: Secondary | ICD-10-CM

## 2015-01-27 DIAGNOSIS — C50312 Malignant neoplasm of lower-inner quadrant of left female breast: Secondary | ICD-10-CM

## 2015-01-27 LAB — CBC WITH DIFFERENTIAL/PLATELET
BASO%: 0.3 % (ref 0.0–2.0)
BASOS ABS: 0 10*3/uL (ref 0.0–0.1)
EOS ABS: 0.2 10*3/uL (ref 0.0–0.5)
EOS%: 2.4 % (ref 0.0–7.0)
HEMATOCRIT: 41 % (ref 34.8–46.6)
HGB: 13.9 g/dL (ref 11.6–15.9)
LYMPH#: 2.8 10*3/uL (ref 0.9–3.3)
LYMPH%: 34.4 % (ref 14.0–49.7)
MCH: 31.2 pg (ref 25.1–34.0)
MCHC: 33.9 g/dL (ref 31.5–36.0)
MCV: 92.1 fL (ref 79.5–101.0)
MONO#: 0.4 10*3/uL (ref 0.1–0.9)
MONO%: 5.5 % (ref 0.0–14.0)
NEUT#: 4.6 10*3/uL (ref 1.5–6.5)
NEUT%: 57.4 % (ref 38.4–76.8)
PLATELETS: 237 10*3/uL (ref 145–400)
RBC: 4.45 10*6/uL (ref 3.70–5.45)
RDW: 13.2 % (ref 11.2–14.5)
WBC: 8 10*3/uL (ref 3.9–10.3)

## 2015-01-27 LAB — COMPREHENSIVE METABOLIC PANEL (CC13)
ALT: 36 U/L (ref 0–55)
ANION GAP: 7 meq/L (ref 3–11)
AST: 26 U/L (ref 5–34)
Albumin: 3.9 g/dL (ref 3.5–5.0)
Alkaline Phosphatase: 103 U/L (ref 40–150)
BUN: 12.5 mg/dL (ref 7.0–26.0)
CALCIUM: 10.1 mg/dL (ref 8.4–10.4)
CHLORIDE: 104 meq/L (ref 98–109)
CO2: 29 mEq/L (ref 22–29)
CREATININE: 0.8 mg/dL (ref 0.6–1.1)
Glucose: 104 mg/dl (ref 70–140)
Potassium: 4.3 mEq/L (ref 3.5–5.1)
Sodium: 139 mEq/L (ref 136–145)
Total Bilirubin: 1.03 mg/dL (ref 0.20–1.20)
Total Protein: 6.9 g/dL (ref 6.4–8.3)

## 2015-01-27 MED ORDER — LORAZEPAM 0.5 MG PO TABS: 0.5000 mg | ORAL_TABLET | Freq: Every evening | ORAL | Status: DC | PRN

## 2015-01-27 MED ORDER — VENLAFAXINE HCL ER 37.5 MG PO CP24: 37.5000 mg | ORAL_CAPSULE | Freq: Every day | ORAL | Status: DC

## 2015-01-27 MED ORDER — LETROZOLE 2.5 MG PO TABS: 2.5000 mg | ORAL_TABLET | Freq: Every day | ORAL | Status: DC

## 2015-01-27 NOTE — Progress Notes (Signed)
ID: [name] OB: 1976-03-08  MR#: [id number]  WUX#:[id number]  PCP: [name] GYN:   SU: Dr. Autumn Messing OTHER MD: Dr. 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 An ultrasound on 10/06/2012 showed a 1.6 x 1.3 x 1.3 cm mass at the 8 o'clock position 7 cm from the nipple. At the 12 o'clock position behind and nipple was a circumscribed oval shaped 2 x 1.8 x 1.2 cm lesion perhaps representing a fibroadenoma. 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 The carcinoma was grade 2-3, estrogen receptor 80% positive, progesterone receptor 80% positive, Ki-67 25%, and HER-2/neu negative. Bilateral breast MRI on 10/22/2012 showed biopsy proven carcinoma in the left lower inner quadrant identified and measures approximately 1.9 x 1.9 x 1.8 cm. Well-defined nodule in the right upper outer quadrant posteriorly consistent with a fibroadenoma. Probable liver cysts.There were multiple scattered nonspecific foci in each breast. No other suspicious abnormality was identified. No abnormal appearing lymph nodes.   The patient's subsequent history is as detailed below  INTERVAL HISTORY:  [name] returns today for follow up of her estrogen receptor positivebreast cancer accompanied by her mother. [name] continues on letrozole, which she is tolerating well. She does have hot flashes and some night sweats as the main side effects.has Since her last visit here she is changed jobs and she is currently working as Health visitor. She likes his job much better.--Since her last visit here she had a "scare" with an area in her] read as  suspicious leading to biopsy which fortunately showed only fibrocystic change.also send her last visit here she underwent bilateral salpingo-oophorectomy. The pathology was benign ([id number])  REVIEW OF SYSTEMS:  She has a little bit less insomnia than she used to. She does have some stress urinary incontinence, which is not a new problem. She doesn't exercise regularly but she is now walking to work since her home is very close to [employer]. She is excited because she is helping her daughter applied to college. She is interested in studding architecture. A detailed review of systems today was otherwise stable   PAST MEDICAL HISTORY: Past Medical History  Diagnosis Date  . Hypertension     under control with med., has been on med. since 09/2012  . Anxiety   . Breast cancer 10/16/12    left, lower medial, 8 o'clock  . History of radiation therapy 07/06/13-08/20/13    left breast/  . Family history of anesthesia complication     pt's mother has hx. of post-op N/V  . Pain in the wrist     BILATERAL  . History of transfusion   . DVT (deep venous thrombosis) 02/2013    RT LEG     PAST SURGICAL HISTORY: Past Surgical History  Procedure Laterality Date  . Cesarean section  1999  . Breast lumpectomy with needle localization and axillary sentinel lymph node bx Left 12/10/2012    Procedure: BREAST LUMPECTOMY WITH NEEDLE LOCALIZATION AND AXILLARY SENTINEL LYMPH NODE BX;  Surgeon: Merrie Roof, MD;  Location: [facility];  Service: General;  Laterality: Left;  Needle loc BCG 7:30  nuc med 9:30    . Tonsillectomy  and adenoidectomy      as a teenager  . Cholecystectomy  1999  . Open reduction internal fixation (orif) tibia/fibula fracture Right   . Portacath placement Right 01/09/2013    Procedure: INSERTION PORT-A-CATH;  Surgeon: Merrie Roof, MD;  Location: [place] [facility];  Service: General;  Laterality: Right;  . Port-a-cath removal    . Robotic assisted bilateral salpingo  oopherectomy Bilateral 01/26/2014    Procedure: ROBOTIC ASSISTED BILATERAL SALPINGO OOPHORECTOMY;  Surgeon: Imagene Gurney A. Alycia Rossetti, MD;  Location: [facility];  Service: Gynecology;  Laterality: Bilateral;  . Leep N/A 01/26/2014    Procedure: LOOP ELECTROSURGICAL EXCISION PROCEDURE (LEEP);  Surgeon: Lucita Lora. Alycia Rossetti, MD;  Location: [facility];  Service: Gynecology;  Laterality: N/A;    FAMILY HISTORY Family History  Problem Relation Age of Onset  . Breast cancer Maternal Grandmother   . Lung cancer Paternal Grandmother   . 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 She is divorced. She lives with her daughter [name] who is 89 years old as of August of 2015. [name] is planning to go to architecture school.    ADVANCED DIRECTIVES: (Updated 01/12/2014) Not in place. At her 10/20/2013 visit the patient was given the appropriate documents to complete and notarize at her discretion. She tells me she intends to name her mother, [name], as healthcare power of attorney. She can be reached at [phone]   HEALTH MAINTENANCE: Social History  Substance Use Topics  . Smoking status: Former Smoker    Quit date: 12/03/2012  . Smokeless tobacco: Never Used  . Alcohol Use: Yes     Comment: occasionally    Colonoscopy: n/a Pap Smear: 03/2013 Eye Exam: 08/2012 Vitamin D Level: pending Lipid Panel: not  Checked recently   Allergies  Allergen Reactions  . Amoxicillin Other (See Comments)    States "it does not work"    Current Outpatient Prescriptions  Medication Sig  Dispense Refill  . ALPRAZolam (XANAX) 0.25 MG tablet Take 1 tablet (0.25 mg total) by mouth at bedtime as needed for sleep. 30 tablet 0  . b complex vitamins tablet Take 1 tablet by mouth every morning.    Marland Kitchen BIOTIN PO Take 1 tablet by mouth every morning.    . Cholecalciferol (VITAMIN D PO) Take 1 tablet by mouth every morning.    Marland Kitchen letrozole (FEMARA) 2.5 MG tablet Take 1 tablet (2.5 mg total) by mouth daily. 30 tablet 3  . lisinopril (PRINIVIL,ZESTRIL) 10 MG tablet TAKE 1 TABLET (10 MG TOTAL) BY MOUTH EVERY MORNING. 30 tablet 2  . LORazepam (ATIVAN) 0.5 MG tablet Take 1 tablet (0.5 mg total) by mouth at bedtime as needed (Nausea or vomiting). 30 tablet 0  . venlafaxine XR (EFFEXOR-XR) 37.5 MG 24 hr capsule TAKE 1 CAPSULE (37.5 MG TOTAL) BY MOUTH DAILY WITH BREAKFAST. 30 capsule 5   No current facility-administered medications for this visit.    OBJECTIVE: Young white femalewho appears stated age 39 Vitals:   01/27/15 0956  BP: 132/82  Pulse: 74  Temp: 97.9 F (36.6 C)  Resp:  18     Body mass index is 38.74 kg/(m^2).       ECOG FS:0 - Asymptomatic   Sclerae unicteric, EOMs intact Oropharynx clear, dentition in good repair No cervical or supraclavicular adenopathy Lungs no rales or rhonchi Heart regular rate and rhythm Abd soft,obese, nontender, positive bowel sounds MSK no focal spinal tenderness, no upper extremity lymphedema Neuro: nonfocal, well oriented, appropriate affect Breasts: the right breast is status post recent biopsy. There is no finding of concern. The right axilla is benign.the left breast is status post lumpectomy and radiation. There is no evidence of local recurrence. The left axilla is benign.   LAB RESULTS:  CMP     Component Value Date/Time   NA 140 07/15/2014 1355   NA 137 01/21/2014 1530   K 3.9 07/15/2014 1355   K 3.4* 01/21/2014 1530   CL 100 01/21/2014 1530   CO2 25 07/15/2014 1355   CO2 27 01/21/2014 1530   GLUCOSE 120 07/15/2014 1355    GLUCOSE 121* 01/21/2014 1530   BUN 13.0 07/15/2014 1355   BUN 11 01/21/2014 1530   CREATININE 0.7 07/15/2014 1355   CREATININE 0.82 01/21/2014 1530   CALCIUM 9.2 07/15/2014 1355   CALCIUM 9.1 01/21/2014 1530   PROT 6.7 07/15/2014 1355   PROT 6.5 01/21/2014 1530   ALBUMIN 3.8 07/15/2014 1355   ALBUMIN 3.4* 01/21/2014 1530   AST 39* 07/15/2014 1355   AST 19 01/21/2014 1530   ALT 50 07/15/2014 1355   ALT 22 01/21/2014 1530   ALKPHOS 113 07/15/2014 1355   ALKPHOS 99 01/21/2014 1530   BILITOT 0.70 07/15/2014 1355   BILITOT 0.5 01/21/2014 1530   GFRNONAA 90* 01/21/2014 1530   GFRAA >90 01/21/2014 1530    I No results found for: SPEP  Lab Results  Component Value Date   WBC 8.0 01/27/2015   NEUTROABS 4.6 01/27/2015   HGB 13.9 01/27/2015   HCT 41.0 01/27/2015   MCV 92.1 01/27/2015   PLT 237 01/27/2015      Chemistry      Component Value Date/Time   NA 140 07/15/2014 1355   NA 137 01/21/2014 1530   K 3.9 07/15/2014 1355   K 3.4* 01/21/2014 1530   CL 100 01/21/2014 1530   CO2 25 07/15/2014 1355   CO2 27 01/21/2014 1530   BUN 13.0 07/15/2014 1355   BUN 11 01/21/2014 1530   CREATININE 0.7 07/15/2014 1355   CREATININE 0.82 01/21/2014 1530      Component Value Date/Time   CALCIUM 9.2 07/15/2014 1355   CALCIUM 9.1 01/21/2014 1530   ALKPHOS 113 07/15/2014 1355   ALKPHOS 99 01/21/2014 1530   AST 39* 07/15/2014 1355   AST 19 01/21/2014 1530   ALT 50 07/15/2014 1355   ALT 22 01/21/2014 1530   BILITOT 0.70 07/15/2014 1355   BILITOT 0.5 01/21/2014 1530       No results found for: LABCA2  No components found for: [id number]  No results for input(s): INR in the last 168 hours.  Urinalysis    Component Value Date/Time   COLORURINE YELLOW 01/21/2014 1503   APPEARANCEUR CLEAR 01/21/2014 1503   LABSPEC 1.014 01/21/2014 1503   LABSPEC 1.010 07/16/2013 1538   PHURINE 6.5 01/21/2014 1503   PHURINE 6.5 07/16/2013 1538   GLUCOSEU NEGATIVE 01/21/2014 1503   GLUCOSEU  Negative 07/16/2013 1538   HGBUR NEGATIVE 01/21/2014 1503   HGBUR Trace 07/16/2013 1538   BILIRUBINUR NEGATIVE 01/21/2014 1503   BILIRUBINUR Negative 07/16/2013 1538  KETONESUR NEGATIVE 01/21/2014 1503   KETONESUR Negative 07/16/2013 1538   PROTEINUR NEGATIVE 01/21/2014 1503   PROTEINUR Negative 07/16/2013 1538   UROBILINOGEN 0.2 01/21/2014 1503   UROBILINOGEN 0.2 07/16/2013 1538   NITRITE NEGATIVE 01/21/2014 1503   NITRITE Negative 07/16/2013 1538   LEUKOCYTESUR NEGATIVE 01/21/2014 1503   LEUKOCYTESUR Negative 07/16/2013 1538    STUDIES: Addendum     Conchita Paris, MD  Wed Dec 15, 2014 11:08:55 AM EDT     ADDENDUM REPORT: 12/15/2014 11:03 ADDENDUM: Pathology revealed fibrocystic changes with adenosis and calcifications in the right breast. This was found to be concordant by Dr. Conchita Paris. Pathology was discussed with the patient by telephone. She reported doing well after the biopsy with tenderness at the site. Post biopsy instructions and care were reviewed and her questions were answered. She was encouraged to call [facility] for any additional concerns. She was asked to return for right diagnostic mammography in 6 months. Pathology results reported by Susa Raring RN, BSN on December 15, 2014. Electronically Signed  By: Conchita Paris M.D.  On: 12/15/2014 11:03     Study Result     CLINICAL DATA: Right upper outer quadrant calcifications. History of left lumpectomy for breast cancer.  EXAM: Right BREAST STEREOTACTIC CORE NEEDLE BIOPSY  COMPARISON: Previous exams.  FINDINGS: The patient and I discussed the procedure of stereotactic-guided biopsy including benefits and alternatives. We discussed the high likelihood of a successful procedure. We discussed the risks of the procedure including infection, bleeding, tissue injury, clip migration, and inadequate sampling. Informed written consent was given. The  usual time out protocol was performed immediately prior to the procedure.  Using sterile technique and 2% Lidocaine as local anesthetic, under stereotactic guidance, a 12 gauge core vacuum needle device was used to perform core needle biopsy of calcifications in the right upper outer quadrant using a superior to inferior approach. Specimen radiograph was performed showing calcifications in the biopsy samples. Specimens with calcifications are identified for pathology.  At the conclusion of the procedure, a coil shaped tissue marker clip was deployed into the biopsy cavity. Follow-up 2-view mammogram was performed and dictated separately.  IMPRESSION: Stereotactic-guided biopsy of right upper outer quadrant calcifications with coil shaped clip placement. Pathology is pending. No apparent complications.  Electronically Signed: By: Conchita Paris M.D. On: 12/14/2014 12:02     Most recent bone density scan on 08/19/13 showed a t-score of -.5 (normal)  ASSESSMENT: 39 y.o. BRCA negative [place] woman  status post left breast lumpectomy with left axillary sentinel lymph node biopsy 12/10/2012 for a pT2, pN39mi, stage IIB invasive ductal carcinoma, grade 3, estrogen receptor 80% positive, progesterone receptor 80% positive, Ki-67 25%, HER-2/neu negative  #1 Genetic testing performed in [place], [place] reported by the patient as BRCA1 and BRCA2 negative.   #2 s/p adjuvant chemotherapy consisting of FEC (5-FU/epirubicin/Cytoxan) begun on 01/23/2013,completed on 04/03/13 (six cycles).   #3 S/p adjuvant weekly Taxol beginning 04/17/13, received  7 of the 12 planned cycles, last dose 06/05/13 (stopped secondary to skin toxicity)  #6 adjuvant radiation to the left breast and regional lymph nodes completed 08/20/2013  #7 goserelin started 04/17/2013, continued through [place] 2015  (a) s/p BSO 01/26/2014 with benign pathology  #8 anastrozole started 08/14/2013; bone density  08/19/2013 was normal  (a) stopped 04/15/14 because of severe joint stiffness. Switched to letrozole starting 05/03/14  (b) DEXA scan 08/19/2013 was normal  #6 Right lower extremity DVT diagnosed on 03/16/2013, patient was placed  on Xarelto. Repeat venous Doppler performed in March 2015 was negative for any thrombosis.  Xarelto was stopped Aug 14, 2012.    PLAN:  Trayce continues to tolerate the letrozole well, and the plan will be to continue that at least through 2020, at which time we might consider continuing an additional 5 years.she had a normal bone scan last year and we will repeat that to years from now.  I have encouraged her to exercise more regularly. It is grade that she is doing some walking. At this point she does not feel she can or wants to join a gym but we should continue to encourage that in subsequent visits.  As far as her hot flashes are concerned I think she would benefit from venlafaxine and we discussed the possible toxicities side effects and complications of that agent. I am starting her at a very low dose. She will let me know if that does not work or if it causes any symptoms.  Otherwise she will return to see [place] in April. She knows to call for any problems that may develop before that visit.   Chauncey Cruel, MD  01/27/2015 10:13 AM

## 2015-01-27 NOTE — Telephone Encounter (Signed)
Appointments made and avs printed for pateint °

## 2015-03-07 ENCOUNTER — Other Ambulatory Visit: Payer: Self-pay | Admitting: Oncology

## 2015-06-04 ENCOUNTER — Other Ambulatory Visit: Payer: Self-pay | Admitting: Oncology

## 2015-06-24 ENCOUNTER — Other Ambulatory Visit: Payer: Self-pay | Admitting: *Deleted

## 2015-06-24 DIAGNOSIS — C50312 Malignant neoplasm of lower-inner quadrant of left female breast: Secondary | ICD-10-CM

## 2015-06-27 ENCOUNTER — Encounter: Payer: Self-pay | Admitting: Nurse Practitioner

## 2015-06-27 ENCOUNTER — Telehealth: Payer: Self-pay | Admitting: Oncology

## 2015-06-27 ENCOUNTER — Other Ambulatory Visit (HOSPITAL_BASED_OUTPATIENT_CLINIC_OR_DEPARTMENT_OTHER): Payer: BLUE CROSS/BLUE SHIELD

## 2015-06-27 ENCOUNTER — Other Ambulatory Visit: Payer: Self-pay | Admitting: *Deleted

## 2015-06-27 ENCOUNTER — Ambulatory Visit (HOSPITAL_BASED_OUTPATIENT_CLINIC_OR_DEPARTMENT_OTHER): Payer: BLUE CROSS/BLUE SHIELD | Admitting: Nurse Practitioner

## 2015-06-27 VITALS — BP 130/78 | HR 74 | Temp 98.0°F | Resp 18 | Ht 68.0 in | Wt 254.7 lb

## 2015-06-27 DIAGNOSIS — Z79811 Long term (current) use of aromatase inhibitors: Secondary | ICD-10-CM

## 2015-06-27 DIAGNOSIS — Z86718 Personal history of other venous thrombosis and embolism: Secondary | ICD-10-CM | POA: Diagnosis not present

## 2015-06-27 DIAGNOSIS — Z853 Personal history of malignant neoplasm of breast: Secondary | ICD-10-CM | POA: Diagnosis not present

## 2015-06-27 DIAGNOSIS — C50319 Malignant neoplasm of lower-inner quadrant of unspecified female breast: Secondary | ICD-10-CM

## 2015-06-27 DIAGNOSIS — C50312 Malignant neoplasm of lower-inner quadrant of left female breast: Secondary | ICD-10-CM

## 2015-06-27 DIAGNOSIS — E2839 Other primary ovarian failure: Secondary | ICD-10-CM

## 2015-06-27 DIAGNOSIS — R635 Abnormal weight gain: Secondary | ICD-10-CM

## 2015-06-27 LAB — COMPREHENSIVE METABOLIC PANEL
ALBUMIN: 3.8 g/dL (ref 3.5–5.0)
ALK PHOS: 82 U/L (ref 40–150)
ALT: 28 U/L (ref 0–55)
AST: 19 U/L (ref 5–34)
Anion Gap: 8 mEq/L (ref 3–11)
BILIRUBIN TOTAL: 0.7 mg/dL (ref 0.20–1.20)
BUN: 14.7 mg/dL (ref 7.0–26.0)
CO2: 28 mEq/L (ref 22–29)
CREATININE: 0.8 mg/dL (ref 0.6–1.1)
Calcium: 10.1 mg/dL (ref 8.4–10.4)
Chloride: 104 mEq/L (ref 98–109)
EGFR: >90 mL/min/{1.73_m2} (ref >90)
Glucose: 153 mg/dl — ABNORMAL HIGH (ref 70–140)
POTASSIUM: 4.4 meq/L (ref 3.5–5.1)
Sodium: 140 mEq/L (ref 136–145)
TOTAL PROTEIN: 7.1 g/dL (ref 6.4–8.3)

## 2015-06-27 LAB — CBC WITH DIFFERENTIAL/PLATELET
BASO%: 0.9 % (ref 0.0–2.0)
BASOS ABS: 0.1 10*3/uL (ref 0.0–0.1)
EOS ABS: 0.2 10*3/uL (ref 0.0–0.5)
EOS%: 1.8 % (ref 0.0–7.0)
HEMATOCRIT: 39.8 % (ref 34.8–46.6)
HEMOGLOBIN: 13.5 g/dL (ref 11.6–15.9)
LYMPH#: 2.5 10*3/uL (ref 0.9–3.3)
LYMPH%: 28.2 % (ref 14.0–49.7)
MCH: 31 pg (ref 25.1–34.0)
MCHC: 33.8 g/dL (ref 31.5–36.0)
MCV: 91.5 fL (ref 79.5–101.0)
MONO#: 0.5 10*3/uL (ref 0.1–0.9)
MONO%: 5.3 % (ref 0.0–14.0)
NEUT%: 63.8 % (ref 38.4–76.8)
NEUTROS ABS: 5.6 10*3/uL (ref 1.5–6.5)
Platelets: 244 10*3/uL (ref 145–400)
RBC: 4.35 10*6/uL (ref 3.70–5.45)
RDW: 13.3 % (ref 11.2–14.5)
WBC: 8.8 10*3/uL (ref 3.9–10.3)

## 2015-06-27 MED ORDER — LORAZEPAM 0.5 MG PO TABS: 0.5000 mg | ORAL_TABLET | Freq: Every evening | ORAL | Status: DC | PRN

## 2015-06-27 NOTE — Progress Notes (Signed)
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 An ultrasound on 10/06/2012 showed a 1.6 x 1.3 x 1.3 cm mass at the 8 o'clock position 7 cm from the nipple. At the 12 o'clock position behind and nipple was a circumscribed oval shaped 2 x 1.8 x 1.2 cm lesion perhaps representing a fibroadenoma. 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 The carcinoma was grade 2-3, estrogen receptor 80% positive, progesterone receptor 80% positive, Ki-67 25%, and HER-2/neu negative. Bilateral breast MRI on 10/22/2012 showed biopsy proven carcinoma in the left lower inner quadrant identified and measures approximately 1.9 x 1.9 x 1.8 cm. Well-defined nodule in the right upper outer quadrant posteriorly consistent with a fibroadenoma. Probable liver cysts.There were multiple scattered nonspecific foci in each breast. No other suspicious abnormality was identified. No abnormal appearing lymph nodes.   The patient's subsequent history is as detailed below  INTERVAL HISTORY:  [name] returns today for follow up of her estrogen receptor positive breast cancer, accompanied by her mother. She continues on letrozole daily and tolerates this drug well. She has much less joint stiffness to her hands and wrists when compared to her experience on anastrozole. Her hot flashes are still there but better controlled on venlafaxine. She denies vaginal changes. The interval history is generally unremarakble.                                                                                   REVIEW OF SYSTEMS:  [name] denies fevers, chills, nausea, vomiting, or changes in bowel or bladder habits. She has stress urinary incontinence, but this is not new. She denies pain but her left breast is often itchy. She has moments of fatigue and insomnia. She is looking to start exercising more and dieting. She denies shortness of breath, chest pain, cough, or palpitations. She has no headaches, dizziness, or vision changes. A detailed review of systems is otherwise stable.  PAST MEDICAL HISTORY: Past Medical History  Diagnosis Date  . Hypertension     under control with med., has been on med. since 09/2012  . Anxiety   . Breast cancer 10/16/12    left, lower medial, 8 o'clock  . History of radiation therapy 07/06/13-08/20/13    left breast/  . Family history of anesthesia complication     pt's mother has hx. of post-op N/V  . Pain in the wrist     BILATERAL  . History of transfusion   . DVT (deep venous thrombosis) 02/2013    RT LEG     PAST SURGICAL HISTORY: Past Surgical History  Procedure Laterality Date  . Cesarean section  1999  . Breast lumpectomy with needle localization and axillary sentinel lymph node bx Left 12/10/2012  Procedure: BREAST LUMPECTOMY WITH NEEDLE LOCALIZATION AND AXILLARY SENTINEL LYMPH NODE BX;  Surgeon: Theresa S Toth III, MD;  Location: [facility] OR;  Service: General;  Laterality: Left;  Needle loc BCG 7:30  nuc med 9:30    . Tonsillectomy and adenoidectomy      as a teenager  . Cholecystectomy  1999  . Open reduction internal fixation (orif) tibia/fibula fracture Right   . Portacath placement Right 01/09/2013    Procedure: INSERTION PORT-A-CATH;  Surgeon: Theresa S Toth III, MD;  Location: [facility];  Service: General;  Laterality: Right;  . Port-a-cath removal    . Robotic assisted bilateral salpingo oopherectomy Bilateral 11/10/[facility]    Procedure: ROBOTIC ASSISTED BILATERAL SALPINGO  OOPHORECTOMY;  Surgeon: Theresa A. Gehrig, MD;  Location: [facility];  Service: Gynecology;  Laterality: Bilateral;  . Leep N/A 11/10/[facility]    Procedure: LOOP ELECTROSURGICAL EXCISION PROCEDURE (LEEP);  Surgeon: Theresa A. Gehrig, MD;  Location: [facility];  Service: Gynecology;  Laterality: N/A;    FAMILY HISTORY Family History  Problem Relation Age of Onset  . Breast cancer Maternal Grandmother   . Lung cancer Paternal Grandmother   . 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 She is divorced. She lives with her daughter [name]. [name] is planning to go to architecture school.    ADVANCED DIRECTIVES: (Updated 10/27/[facility]) Not in place. At her 08/04/[facility] visit the patient was given the appropriate documents to complete and notarize at her discretion. She tells me she intends to name her mother, [name], as healthcare power of attorney. She can be reached at [phone]   HEALTH MAINTENANCE: Social History  Substance Use Topics  . Smoking status: Former Smoker    Quit date: 12/03/2012  . Smokeless tobacco: Never Used  . Alcohol Use: Yes     Comment: occasionally    Colonoscopy: n/a Pap Smear: 1/[facility] Eye Exam: 08/2012 Vitamin D Level: pending Lipid Panel: not  Checked recently   Allergies  Allergen Reactions  . Amoxicillin Other (See Comments)    States "it does not work"    Current Outpatient Prescriptions  Medication Sig Dispense Refill  . b complex vitamins tablet Take 1 tablet by mouth every morning.     . BIOTIN PO Take 1 tablet by mouth every morning.    . Cholecalciferol 2000 units CAPS Take 1 capsule by mouth daily.    . letrozole (FEMARA) 2.5 MG tablet Take 1 tablet (2.5 mg total) by mouth daily. 90 tablet 3  . lisinopril (PRINIVIL,ZESTRIL) 10 MG tablet TAKE 1 TABLET (10 MG TOTAL) BY MOUTH EVERY MORNING. 30 tablet 2  . venlafaxine XR (EFFEXOR-XR) 37.5 MG 24 hr capsule Take 1 capsule (37.5 mg total) by mouth daily with breakfast. 90 capsule 5  . LORazepam (ATIVAN) 0.5 MG tablet Take 1 tablet (0.5 mg total) by mouth at bedtime as needed (Nausea or vomiting). 30 tablet 0   No current facility-administered medications for this visit.    OBJECTIVE: Young white femalewho appears stated age Filed Vitals:     06/27/15 1025  BP: 130/78  Pulse: 74  Temp: 98 F (36.7 C)  Resp: 18     Body mass index is 38.74 kg/(m^2).       ECOG FS:0 - Asymptomatic   Skin: warm, dry  HEENT: sclerae anicteric, conjunctivae pink, oropharynx clear. No thrush or mucositis.  Lymph Nodes: No cervical or supraclavicular lymphadenopathy  Lungs: clear to auscultation bilaterally, no rales, wheezes, or rhonci  Heart: regular rate and rhythm  Abdomen: obese, soft, non tender, positive bowel sounds  Musculoskeletal: No focal spinal tenderness, no peripheral edema  Neuro: non focal, well oriented, positive affect  Breast: left breast status post lumpectomy and radiation. No evidence of recurrent disease. Left axilla benign. Right breast s/p biopsy, otherwise unremarkable.  LAB RESULTS:  CMP     Component Value Date/Time   NA 140 06/27/2015 1009   NA 137 11/05/[facility] 1530   K 4.4 06/27/2015 1009   K 3.4* 11/05/[facility] 1530   CL 100 11/05/[facility] 1530   CO2 28 06/27/2015 1009   CO2 27 11/05/[facility] 1530   GLUCOSE 153* 06/27/2015 1009   GLUCOSE 121* 11/05/[facility] 1530   BUN 14.7 06/27/2015 1009   BUN 11 11/05/[facility] 1530   CREATININE 0.8 06/27/2015 1009   CREATININE 0.82 11/05/[facility] 1530   CALCIUM 10.1 06/27/2015 1009    CALCIUM 9.1 11/05/[facility] 1530   PROT 7.1 06/27/2015 1009   PROT 6.5 11/05/[facility] 1530   ALBUMIN 3.8 06/27/2015 1009   ALBUMIN 3.4* 11/05/[facility] 1530   AST 19 06/27/2015 1009   AST 19 11/05/[facility] 1530   ALT 28 06/27/2015 1009   ALT 22 11/05/[facility] 1530   ALKPHOS 82 06/27/2015 1009   ALKPHOS 99 11/05/[facility] 1530   BILITOT 0.70 06/27/2015 1009   BILITOT 0.5 11/05/[facility] 1530   GFRNONAA 90* 11/05/[facility] 1530   GFRAA >90 11/05/[facility] 1530    I No results found for: SPEP  Lab Results  Component Value Date   WBC 8.8 06/27/2015   NEUTROABS 5.6 06/27/2015   HGB 13.5 06/27/2015   HCT 39.8 06/27/2015   MCV 91.5 06/27/2015   PLT 244 06/27/2015      Chemistry      Component Value Date/Time   NA 140 06/27/2015 1009   NA 137 11/05/[facility] 1530   K 4.4 06/27/2015 1009   K 3.4* 11/05/[facility] 1530   CL 100 11/05/[facility] 1530   CO2 28 06/27/2015 1009   CO2 27 11/05/[facility] 1530   BUN 14.7 06/27/2015 1009   BUN 11 11/05/[facility] 1530   CREATININE 0.8 06/27/2015 1009   CREATININE 0.82 11/05/[facility] 1530      Component Value Date/Time   CALCIUM 10.1 06/27/2015 1009   CALCIUM 9.1 11/05/[facility] 1530   ALKPHOS 82 06/27/2015 1009   ALKPHOS 99 11/05/[facility] 1530   AST 19 06/27/2015 1009   AST 19 11/05/[facility] 1530   ALT 28 06/27/2015 1009   ALT 22 11/05/[facility] 1530   BILITOT 0.70 06/27/2015 1009   BILITOT 0.5 11/05/[facility] 1530       No results found for: LABCA2  No components found for: LABCA125  No results for input(s): INR in the last 168 hours.  Urinalysis    Component Value Date/Time   COLORURINE YELLOW 11/05/[facility] 1503   APPEARANCEUR CLEAR 11/05/[facility] 1503   LABSPEC 1.014 11/05/[facility] 1503   LABSPEC 1.010 04/30/[facility] 1538   PHURINE 6.5 11/05/[facility] 1503   PHURINE 6.5 04/30/[facility] 1538   GLUCOSEU NEGATIVE 11/05/[facility] 1503   GLUCOSEU Negative 04/30/[facility] 1538   HGBUR NEGATIVE 11/05/[facility] 1503     HGBUR Trace 04/30/[facility] 1538   BILIRUBINUR NEGATIVE 11/05/[facility] 1503   BILIRUBINUR Negative 04/30/[facility] 1538   KETONESUR NEGATIVE  11/05/[facility] 1503   KETONESUR Negative 04/30/[facility] 1538   PROTEINUR NEGATIVE 11/05/[facility] 1503   PROTEINUR Negative 04/30/[facility] 1538   UROBILINOGEN 0.2 11/05/[facility] 1503   UROBILINOGEN 0.2 04/30/[facility] 1538   NITRITE NEGATIVE 11/05/[facility] 1503   NITRITE Negative 04/30/[facility] 1538   LEUKOCYTESUR NEGATIVE 11/05/[facility] 1503   LEUKOCYTESUR Negative 04/30/[facility] 1538    STUDIES: Addendum     Gretchen Green, MD  Wed Dec 15, 2014 11:08:55 AM EDT     ADDENDUM REPORT: 12/15/2014 11:03 ADDENDUM: Pathology revealed fibrocystic changes with adenosis and calcifications in the right breast. This was found to be concordant by Theresa. Gretchen Green. Pathology was discussed with the patient by telephone. She reported doing well after the biopsy with tenderness at the site. Post biopsy instructions and care were reviewed and her questions were answered. She was encouraged to call [facility] for any additional concerns. She was asked to return for right diagnostic mammography in 6 months. Pathology results reported by Leigh Kuhnly RN, BSN on December 15, 2014. Electronically Signed  By: Gretchen Green M.D.  On: 12/15/2014 11:03     Study Result     CLINICAL DATA: Right upper outer quadrant calcifications. History of left lumpectomy for breast cancer.  EXAM: Right BREAST STEREOTACTIC CORE NEEDLE BIOPSY  COMPARISON: Previous exams.  FINDINGS: The patient and I discussed the procedure of stereotactic-guided biopsy including benefits and alternatives. We discussed the high likelihood of a successful procedure. We discussed the risks of the procedure including infection, bleeding, tissue injury, clip migration, and inadequate sampling. Informed written consent was given. The usual time out protocol was performed immediately prior to the procedure.  Using sterile technique and 2% Lidocaine as local anesthetic, under stereotactic guidance, a 12 gauge core vacuum  needle device was used to perform core needle biopsy of calcifications in the right upper outer quadrant using a superior to inferior approach. Specimen radiograph was performed showing calcifications in the biopsy samples. Specimens with calcifications are identified for pathology.  At the conclusion of the procedure, a coil shaped tissue marker clip was deployed into the biopsy cavity. Follow-up 2-view mammogram was performed and dictated separately.  IMPRESSION: Stereotactic-guided biopsy of right upper outer quadrant calcifications with coil shaped clip placement. Pathology is pending. No apparent complications.  Electronically Signed: By: Gretchen Green M.D. On: 12/14/2014 12:02     Most recent bone density scan on 08/19/13 showed a t-score of -.5 (normal)  ASSESSMENT: 40 y.o. BRCA negative [place], [place] woman  status post left breast lumpectomy with left axillary sentinel lymph node biopsy 12/10/2012 for a pT2, pN1mi, stage IIB invasive ductal carcinoma, grade 3, estrogen receptor 80% positive, progesterone receptor 80% positive, Ki-67 25%, HER-2/neu negative  #1 Genetic testing performed in [place], [place] reported by the patient as BRCA1 and BRCA2 negative.   #2 s/p adjuvant chemotherapy consisting of FEC (5-FU/epirubicin/Cytoxan) begun on 01/23/2013,completed on 04/03/13 (six cycles).   #3 S/p adjuvant weekly Taxol beginning 04/17/13, received  7 of the 12 planned cycles, last dose 06/05/13 (stopped secondary to skin toxicity)  #6 adjuvant radiation to the left breast and regional lymph nodes completed 06/04/Cox  #7 goserelin started 01/30/Cox, continued through OCT [facility]  (a) s/p BSO 11/10/Cox with benign pathology  #8 anastrozole started 05/29/Cox; bone density 06/03/Cox was normal  (a) stopped 04/15/14 because of severe joint stiffness. Switched to letrozole starting 05/03/14  (  b) DEXA scan 06/03/[facility] was normal  #6 Right lower extremity DVT diagnosed on  03/16/2013, patient was placed on Xarelto. Repeat venous Doppler performed in March Cox was negative for any thrombosis.  Xarelto was stopped Aug 14, 2012.    PLAN:  Bradley is doing well as far as her breast cancer is concerned. She is now 2.5 years out from he definitive surgery with no evidence of recurrent diease. She is tolerating the letrozole well and will continue this for at least 5 years of antiestrogen therapy.   She is due for a 6 month check of her right breast this month and and a bone density scan in June. I have placed orders for both scans during our visit.   We discussed healthy methods for dieting. She is interested in [employer]. She will avoid simple carbs and increase her non starchy vegetable intake. She will also work to avoid empty calories in the form of high sugar juices and sodas. Water is really best. She likes to kayak and will use this as her form of exercise alongside walking a few days a week  Maritsa will return in 6 months for follow up with Theresa. Jana Hakim after her annual bilateral mammogram. At that time he will likely move to yearly visits. She understands and agrees with this plan. She knows the goal of treatment in her case is cure. She has been encouraged to call with any issues that might arise before her next visit here.    Laurie Panda, NP 06/27/2015 11:04 AM

## 2015-06-27 NOTE — Telephone Encounter (Signed)
appt made and avs printed °

## 2015-07-18 ENCOUNTER — Inpatient Hospital Stay: Admission: RE | Admit: 2015-07-18 | Payer: BLUE CROSS/BLUE SHIELD | Source: Ambulatory Visit

## 2015-08-22 ENCOUNTER — Ambulatory Visit
Admission: RE | Admit: 2015-08-22 | Discharge: 2015-08-22 | Disposition: A | Discharge status: Home / Self Care | Payer: BLUE CROSS/BLUE SHIELD | Source: Ambulatory Visit | Attending: Nurse Practitioner | Admitting: Nurse Practitioner

## 2015-08-22 DIAGNOSIS — C50312 Malignant neoplasm of lower-inner quadrant of left female breast: Secondary | ICD-10-CM

## 2015-08-22 DIAGNOSIS — E2839 Other primary ovarian failure: Secondary | ICD-10-CM

## 2015-09-11 ENCOUNTER — Other Ambulatory Visit: Payer: Self-pay | Admitting: Oncology

## 2015-12-02 ENCOUNTER — Telehealth: Payer: Self-pay | Admitting: Oncology

## 2015-12-02 ENCOUNTER — Other Ambulatory Visit: Payer: Self-pay | Admitting: Oncology

## 2015-12-02 DIAGNOSIS — Z853 Personal history of malignant neoplasm of breast: Secondary | ICD-10-CM

## 2015-12-02 NOTE — Telephone Encounter (Signed)
12/27/2015 Appointment rescheduled to 01/09/2016 per patient request. Patient prefers early morning.

## 2015-12-05 ENCOUNTER — Other Ambulatory Visit: Payer: Self-pay | Admitting: Oncology

## 2015-12-09 ENCOUNTER — Ambulatory Visit
Admission: RE | Admit: 2015-12-09 | Discharge: 2015-12-09 | Disposition: A | Discharge status: Home / Self Care | Payer: BLUE CROSS/BLUE SHIELD | Source: Ambulatory Visit | Attending: Oncology | Admitting: Oncology

## 2015-12-09 DIAGNOSIS — Z853 Personal history of malignant neoplasm of breast: Secondary | ICD-10-CM

## 2015-12-27 ENCOUNTER — Other Ambulatory Visit: Payer: BLUE CROSS/BLUE SHIELD

## 2015-12-27 ENCOUNTER — Ambulatory Visit: Payer: BLUE CROSS/BLUE SHIELD | Admitting: Oncology

## 2016-01-06 ENCOUNTER — Other Ambulatory Visit: Payer: Self-pay | Admitting: *Deleted

## 2016-01-06 DIAGNOSIS — C50312 Malignant neoplasm of lower-inner quadrant of left female breast: Secondary | ICD-10-CM

## 2016-01-09 ENCOUNTER — Other Ambulatory Visit (HOSPITAL_BASED_OUTPATIENT_CLINIC_OR_DEPARTMENT_OTHER): Payer: BLUE CROSS/BLUE SHIELD

## 2016-01-09 ENCOUNTER — Ambulatory Visit (HOSPITAL_BASED_OUTPATIENT_CLINIC_OR_DEPARTMENT_OTHER): Payer: BLUE CROSS/BLUE SHIELD | Admitting: Oncology

## 2016-01-09 ENCOUNTER — Telehealth: Payer: Self-pay | Admitting: Oncology

## 2016-01-09 VITALS — BP 119/83 | HR 87 | Temp 98.1°F | Resp 18 | Ht 68.0 in | Wt 255.7 lb

## 2016-01-09 DIAGNOSIS — C773 Secondary and unspecified malignant neoplasm of axilla and upper limb lymph nodes: Secondary | ICD-10-CM | POA: Diagnosis not present

## 2016-01-09 DIAGNOSIS — C50312 Malignant neoplasm of lower-inner quadrant of left female breast: Secondary | ICD-10-CM

## 2016-01-09 LAB — COMPREHENSIVE METABOLIC PANEL
ALT: 41 U/L (ref 0–55)
ANION GAP: 9 meq/L (ref 3–11)
AST: 25 U/L (ref 5–34)
Albumin: 3.7 g/dL (ref 3.5–5.0)
Alkaline Phosphatase: 101 U/L (ref 40–150)
BUN: 16.3 mg/dL (ref 7.0–26.0)
CHLORIDE: 102 meq/L (ref 98–109)
CO2: 26 meq/L (ref 22–29)
Calcium: 9.8 mg/dL (ref 8.4–10.4)
Creatinine: 0.8 mg/dL (ref 0.6–1.1)
Glucose: 160 mg/dl — ABNORMAL HIGH (ref 70–140)
POTASSIUM: 4.2 meq/L (ref 3.5–5.1)
Sodium: 137 mEq/L (ref 136–145)
Total Bilirubin: 1.14 mg/dL (ref 0.20–1.20)
Total Protein: 7 g/dL (ref 6.4–8.3)

## 2016-01-09 LAB — CBC WITH DIFFERENTIAL/PLATELET
BASO%: 0.4 % (ref 0.0–2.0)
Basophils Absolute: 0.1 10*3/uL (ref 0.0–0.1)
EOS ABS: 0.2 10*3/uL (ref 0.0–0.5)
EOS%: 1.4 % (ref 0.0–7.0)
HCT: 39.5 % (ref 34.8–46.6)
HGB: 13.5 g/dL (ref 11.6–15.9)
LYMPH%: 17.8 % (ref 14.0–49.7)
MCH: 30.9 pg (ref 25.1–34.0)
MCHC: 34.3 g/dL (ref 31.5–36.0)
MCV: 90.1 fL (ref 79.5–101.0)
MONO#: 0.8 10*3/uL (ref 0.1–0.9)
MONO%: 6.5 % (ref 0.0–14.0)
NEUT#: 8.6 10*3/uL — ABNORMAL HIGH (ref 1.5–6.5)
NEUT%: 73.9 % (ref 38.4–76.8)
PLATELETS: 226 10*3/uL (ref 145–400)
RBC: 4.38 10*6/uL (ref 3.70–5.45)
RDW: 13.2 % (ref 11.2–14.5)
WBC: 11.6 10*3/uL — ABNORMAL HIGH (ref 3.9–10.3)
lymph#: 2.1 10*3/uL (ref 0.9–3.3)

## 2016-01-09 MED ORDER — LETROZOLE 2.5 MG PO TABS: 2.5000 mg | ORAL_TABLET | Freq: Every day | ORAL | 3 refills | Status: DC

## 2016-01-09 MED ORDER — VENLAFAXINE HCL ER 37.5 MG PO CP24: 37.5000 mg | ORAL_CAPSULE | Freq: Every day | ORAL | 5 refills | Status: DC

## 2016-01-09 NOTE — Telephone Encounter (Signed)
Gave patient avs report and appointments for October 2018.

## 2016-01-09 NOTE — Progress Notes (Signed)
ID: [name] OB: 09-19-1975  MR#: [id number]  EUM#:[id number]  PCP: Stacie L. Sherre Lain (Inactive) GYN:   SU: Dr. Autumn Messing OTHER MD: Dr. 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 An ultrasound on 10/06/2012 showed a 1.6 x 1.3 x 1.3 cm mass at the 8 o'clock position 7 cm from the nipple. At the 12 o'clock position behind and nipple was a circumscribed oval shaped 2 x 1.8 x 1.2 cm lesion perhaps representing a fibroadenoma. 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 The carcinoma was grade 2-3, estrogen receptor 80% positive, progesterone receptor 80% positive, Ki-67 25%, and HER-2/neu negative. Bilateral breast MRI on 10/22/2012 showed biopsy proven carcinoma in the left lower inner quadrant identified and measures approximately 1.9 x 1.9 x 1.8 cm. Well-defined nodule in the right upper outer quadrant posteriorly consistent with a fibroadenoma. Probable liver cysts.There were multiple scattered nonspecific foci in each breast. No other suspicious abnormality was identified. No abnormal appearing lymph nodes.   The patient's subsequent history is as detailed below  INTERVAL HISTORY:  [name] returns today for follow up of her breast cancer, accompanied by her mother. She is tolerating letrozole well. Flashes are less of a problem than before. Vaginal dryness is not an issue. She obtains a drug at a good price.  She had a repeat bone density this year which shows the T score has dropped from -0.5 to -1.7.                                                                            REVIEW OF SYSTEMS:  [name] does  some walking but not regularly. She does not have A. fib. Or way of counting her steps. She still gets leg cramps at night. She describes herself is mildly fatigued. She has some sinus issues and a little bit of a "cold" right now. Otherwise a detailed review of systems today was stable  PAST MEDICAL HISTORY: Past Medical History:  Diagnosis Date  . Anxiety   . Breast cancer ([facility]) 10/16/12   left, lower medial, 8 o'clock  . DVT (deep venous thrombosis) ([facility]) 02/2013   RT LEG   . Family history of anesthesia complication    pt's mother has hx. of post-op N/V  . History of radiation therapy 07/06/13-08/20/13   left breast/  . History of transfusion   . Hypertension    under control with med., has been on med. since 09/2012  . Pain in the wrist    BILATERAL    PAST SURGICAL HISTORY: Past Surgical History:  Procedure Laterality Date  . BREAST LUMPECTOMY WITH NEEDLE LOCALIZATION AND AXILLARY SENTINEL LYMPH NODE BX Left 12/10/2012   Procedure: BREAST LUMPECTOMY WITH NEEDLE LOCALIZATION AND AXILLARY SENTINEL LYMPH NODE BX;  Surgeon: Merrie Roof, MD;  Location: [facility];  Service: General;  Laterality: Left;  Needle loc BCG 7:30  nuc med 9:30    .  CESAREAN SECTION  1999  . CHOLECYSTECTOMY  1999  . LEEP N/A 01/26/2014   Procedure: LOOP ELECTROSURGICAL EXCISION PROCEDURE (LEEP);  Surgeon: Lucita Lora. Alycia Rossetti, MD;  Location: [facility];  Service: Gynecology;  Laterality: N/A;  . OPEN REDUCTION INTERNAL FIXATION (ORIF) TIBIA/FIBULA FRACTURE Right   . PORT-A-CATH REMOVAL    . PORTACATH PLACEMENT Right 01/09/2013   Procedure: INSERTION PORT-A-CATH;  Surgeon: Merrie Roof, MD;  Location: [facility];  Service: General;  Laterality: Right;  . ROBOTIC ASSISTED BILATERAL SALPINGO OOPHERECTOMY Bilateral 01/26/2014   Procedure: ROBOTIC ASSISTED BILATERAL SALPINGO OOPHORECTOMY;  Surgeon: Imagene Gurney A. Alycia Rossetti, MD;  Location: [facility];  Service: Gynecology;  Laterality: Bilateral;  . TONSILLECTOMY AND  ADENOIDECTOMY     as a teenager    FAMILY HISTORY Family History  Problem Relation Age of Onset  . Breast cancer Maternal Grandmother   . Lung cancer Paternal Grandmother   . 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 She is divorced. She lives with her daughter [name] who is 70 years old as of August of 2015. [name] is planning to go to architecture school.    ADVANCED DIRECTIVES: (Updated 01/12/2014) Not in place. At her 10/20/2013 visit the patient was given the appropriate documents to complete and notarize at her discretion. She tells me she intends to name her mother, [name], as healthcare power of attorney. She can be reached at [phone]   HEALTH MAINTENANCE: Social History  Substance Use Topics  . Smoking status: Former Smoker    Quit date: 12/03/2012  . Smokeless tobacco: Never Used  . Alcohol use Yes     Comment: occasionally    Colonoscopy: n/a Pap Smear: 03/2013 Eye Exam: 08/2012 Vitamin D Level: pending Lipid Panel: not  Checked recently   Allergies  Allergen Reactions  . Amoxicillin Other (See Comments)    States "it does not work"    Current Outpatient Prescriptions  Medication Sig Dispense Refill  . b complex vitamins tablet Take 1 tablet by mouth every morning.    Marland Kitchen BIOTIN PO Take 1 tablet by mouth every morning.    . Cholecalciferol 2000 units CAPS Take 1 capsule by mouth daily.    Marland Kitchen letrozole (FEMARA) 2.5 MG tablet Take 1 tablet (2.5 mg total) by mouth daily. 90 tablet 3  . lisinopril (PRINIVIL,ZESTRIL) 10 MG  tablet TAKE 1 TABLET (10 MG TOTAL) BY MOUTH EVERY MORNING. 30 tablet 2  . LORazepam (ATIVAN) 0.5 MG tablet Take 1 tablet (0.5 mg total) by mouth at bedtime as needed (Nausea or vomiting). 30 tablet 0  . venlafaxine XR (EFFEXOR-XR) 37.5 MG 24 hr capsule Take 1 capsule (37.5 mg total) by mouth daily with breakfast. 90 capsule 5   No current facility-administered medications for this visit.     OBJECTIVE: Young white female Vitals:   01/09/16 0929  BP: 119/83  Pulse: 87  Resp: 18  Temp: 98.1 F (36.7 C)     Body mass index is 38.88 kg/m.       ECOG FS:1 - Symptomatic but completely ambulatory  Sclerae unicteric, pupils round and equal Oropharynx clear and moist-- no thrush or other lesions No cervical or supraclavicular adenopathy Lungs no rales or rhonchi Heart regular rate and rhythm Abd soft, nontender, positive bowel sounds MSK no focal spinal tenderness, no upper extremity lymphedema Neuro: nonfocal, well oriented, appropriate affect Breasts: The right breast is unremarkable. The left breast is status post lumpectomy and radiation. There is no evidence of local recurrence. The left axilla is benign.    LAB RESULTS:  CMP     Component Value Date/Time   NA 140 06/27/2015 1009   K 4.4 06/27/2015 1009   CL 100 01/21/2014 1530   CO2 28 06/27/2015 1009   GLUCOSE 153 (H) 06/27/2015 1009   BUN 14.7 06/27/2015 1009   CREATININE 0.8 06/27/2015 1009   CALCIUM 10.1 06/27/2015 1009   PROT 7.1 06/27/2015 1009   ALBUMIN 3.8 06/27/2015 1009   AST 19 06/27/2015 1009   ALT 28 06/27/2015 1009   ALKPHOS 82 06/27/2015 1009   BILITOT 0.70 06/27/2015 1009   GFRNONAA 90 (L) 01/21/2014 1530   GFRAA >90 01/21/2014 1530    I No results found for: SPEP  Lab Results  Component Value Date   WBC 11.6 (H) 01/09/2016   NEUTROABS 8.6 (H) 01/09/2016   HGB 13.5 01/09/2016   HCT 39.5 01/09/2016   MCV 90.1 01/09/2016   PLT 226 01/09/2016      Chemistry      Component Value  Date/Time   NA 140 06/27/2015 1009   K 4.4 06/27/2015 1009   CL 100 01/21/2014 1530   CO2 28 06/27/2015 1009   BUN 14.7 06/27/2015 1009   CREATININE 0.8 06/27/2015 1009      Component Value Date/Time   CALCIUM 10.1 06/27/2015 1009   ALKPHOS 82 06/27/2015 1009   AST 19 06/27/2015 1009   ALT 28 06/27/2015 1009   BILITOT 0.70 06/27/2015 1009       No results found for: LABCA2  No components found for: LABCA125  No results for input(s): INR in the last 168 hours.  Urinalysis    Component Value Date/Time   COLORURINE YELLOW 01/21/2014 1503   APPEARANCEUR CLEAR 01/21/2014 1503   LABSPEC 1.014 01/21/2014 1503   LABSPEC 1.010 07/16/2013 1538   PHURINE 6.5 01/21/2014 1503   GLUCOSEU NEGATIVE 01/21/2014 1503   GLUCOSEU Negative 07/16/2013 1538   HGBUR NEGATIVE 01/21/2014 1503   BILIRUBINUR NEGATIVE 01/21/2014 1503   BILIRUBINUR Negative 07/16/2013 1538   KETONESUR NEGATIVE 01/21/2014 1503   PROTEINUR NEGATIVE 01/21/2014 1503   UROBILINOGEN 0.2 01/21/2014 1503   UROBILINOGEN 0.2 07/16/2013 1538   NITRITE NEGATIVE 01/21/2014 1503   LEUKOCYTESUR NEGATIVE 01/21/2014 1503   LEUKOCYTESUR Negative 07/16/2013 1538    STUDIES: CLINICAL DATA:  40 year old female presenting for routine follow-up status post left breast lumpectomy in 2014.  EXAM: 2D DIGITAL DIAGNOSTIC BILATERAL MAMMOGRAM WITH CAD AND ADJUNCT TOMO  COMPARISON:  Previous exam(s).  ACR Breast Density Category b: There are scattered areas of fibroglandular density.  FINDINGS: Interval development of amorphous and punctate calcifications at the lumpectomy site in the left breast spanning approximately 1.9 cm. Otherwise, the left breast lumpectomy site is stable. No suspicious calcifications, masses or areas of distortion are seen in the bilateral breasts.  Mammographic images were processed with CAD.  IMPRESSION: 1. Interval development of amorphous and punctate calcifications in the left breast  lumpectomy site. These are probably benign, likely related to developing dystrophic calcifications following surgery.  2.  No mammographic evidence  of malignancy in the bilateral breasts.  RECOMMENDATION: Six-month follow-up diagnostic left breast mammogram is recommended.  I have discussed the findings and recommendations with the patient. Results were also provided in writing at the conclusion of the visit. If applicable, a reminder letter will be sent to the patient regarding the next appointment.  BI-RADS CATEGORY  3: Probably benign.   Electronically Signed   By: Ammie Ferrier M.D.   On: 12/09/2015 10:05  Most recent bone density scan 08/22/2015 showed a T score of -1.7.  ASSESSMENT: 40 y.o. BRCA negative [place], [place] woman  status post left breast Lower inner quadrant lumpectomy with left axillary sentinel lymph node biopsy 12/10/2012 for a pT2, pN29m, stage IIB invasive ductal carcinoma, grade 3, estrogen receptor 80% positive, progesterone receptor 80% positive, Ki-67 25%, HER-2/neu negative  [place]2 [place].   #2 s/p adjuvant chemotherapy consisting of FEC (5-FU/epirubicin/Cytoxan) begun on 01/23/2013,completed on 04/03/13 (six cycles).   #3 S/p adjuvant weekly Taxol beginning 04/17/13, received  7 of the 12 planned cycles, last dose 06/05/13 (stopped secondary to skin toxicity)  #6 adjuvant radiation to the left breast and regional lymph nodes completed 08/20/2013  #7 goserelin started 04/17/2013, [place]  (a) s/p BSO 01/26/2014 with benign pathology  #8 anastrozole started 08/14/2013; bone density 08/19/2013 was normal  (a) stopped 04/15/14 because of severe joint stiffness. Switched to letrozole starting 05/03/14  (b) DEXA scan 08/22/2015 showed a T score of -1.7  #6 Right lower extremity DVT diagnosed on 03/16/2013, patient was placed on Xarelto.  Repeat venous Doppler performed in March 2015 was negative for any thrombosis.  Xarelto was stopped Aug 14, 2012.    PLAN:  SKashmiris now 3 years out from definitive surgery for her breast cancer with no evidence of disease recurrence. This is very favorable.  She is 2 years into her anti-estrogen treatment. She tolerates that well. The big issue of course is osteopenia. She has lost some bone loss because of the aromatase inhibitors.  We discussed this at great length today. We considered alendronate or ibandronate or denosumab/Prolia, but after much discussion we decided she is going to optimize her walking program and titer walk approximately 45 minutes 5 times a week. She is ready and vitamin D supplementation.  Unfortunately we cannot switch to tamoxifen given her history of DVT.  As far as her leg cramps are concerned we discussed hydration and eating some fruit daily to make sure her potassium level is adequate  At this point I feel comfortable seeing her on a once a year basis. At the next visit we will obtain a vitamin D level the goal is to complete a total of 5 years of anti-estrogens.  She knows to call for any problems that may develop before her next visit here.   MChauncey Cruel MD 01/09/2016 9:51 AM

## 2016-01-17 ENCOUNTER — Other Ambulatory Visit: Payer: Self-pay

## 2016-01-17 MED ORDER — LISINOPRIL 10 MG PO TABS: ORAL_TABLET | ORAL | 11 refills | Status: DC

## 2016-03-19 ENCOUNTER — Other Ambulatory Visit: Payer: Self-pay | Admitting: Nurse Practitioner

## 2016-04-17 ENCOUNTER — Other Ambulatory Visit: Payer: Self-pay | Admitting: *Deleted

## 2016-04-17 MED ORDER — LISINOPRIL 10 MG PO TABS: ORAL_TABLET | ORAL | 11 refills | Status: DC

## 2016-04-19 ENCOUNTER — Other Ambulatory Visit: Payer: Self-pay | Admitting: Emergency Medicine

## 2016-04-19 MED ORDER — LISINOPRIL 10 MG PO TABS: ORAL_TABLET | ORAL | 3 refills | Status: DC

## 2016-10-25 ENCOUNTER — Other Ambulatory Visit: Payer: Self-pay | Admitting: Oncology

## 2016-10-25 DIAGNOSIS — Z9889 Other specified postprocedural states: Secondary | ICD-10-CM

## 2016-12-10 ENCOUNTER — Ambulatory Visit
Admission: RE | Admit: 2016-12-10 | Discharge: 2016-12-10 | Disposition: A | Discharge status: Home / Self Care | Payer: BLUE CROSS/BLUE SHIELD | Source: Ambulatory Visit | Attending: Oncology | Admitting: Oncology

## 2016-12-10 DIAGNOSIS — Z9889 Other specified postprocedural states: Secondary | ICD-10-CM

## 2017-01-06 NOTE — Progress Notes (Signed)
ID: [name] OB: Nov 17, 1975  MR#: [id number]  HAL#:[id number]  PCP: Tomie China (Inactive) GYN:   SU: Dr. Autumn Messing OTHER MD: Dr. 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 An ultrasound on 10/06/2012 showed a 1.6 x 1.3 x 1.3 cm mass at the 8 o'clock position 7 cm from the nipple. At the 12 o'clock position behind and nipple was a circumscribed oval shaped 2 x 1.8 x 1.2 cm lesion perhaps representing a fibroadenoma. 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 The carcinoma was grade 2-3, estrogen receptor 80% positive, progesterone receptor 80% positive, Ki-67 25%, and HER-2/neu negative. Bilateral breast MRI on 10/22/2012 showed biopsy proven carcinoma in the left lower inner quadrant identified and measures approximately 1.9 x 1.9 x 1.8 cm. Well-defined nodule in the right upper outer quadrant posteriorly consistent with a fibroadenoma. Probable liver cysts.There were multiple scattered nonspecific foci in each breast. No other suspicious abnormality was identified. No abnormal appearing lymph nodes.   The patient's subsequent history is as detailed below  INTERVAL HISTORY:  [name] returns today for follow-up and treatment of her estrogen receptor positive breast cancer. She is accompanied by her mother today. She continues on letrozole, with overall good tolerance. She has hot flashes that have gotten better. She has occasional vaginal dryness. She notes that she has continued to work and there is less stress as she works on her crafts and crochets to alleviate stress from her job.  Since her last visit  to the office, she has had a bilateral diagnostic mammography with tomography at [facility] on 12/10/2016 with results showing no evidence of malignancy in either breast.                                                                            REVIEW OF SYSTEMS:  [name] reports that she walks as her form of exercise. When she is unable to walk, she completes an exercise video. She reports bilateral leg cramps at night that has mildly resolved. She started eating a banana at night that has aided in decreasing the recurrence of BLE cramps. She denies unusual headaches, visual changes, nausea, vomiting, or dizziness. There has been no unusual cough, phlegm production, or pleurisy. This been no change in bowel or bladder habits. She denies unexplained fatigue or unexplained weight loss, bleeding, rash, or fever. A detailed review of systems was otherwise stable.    PAST MEDICAL HISTORY: Past Medical History:  Diagnosis Date  . Anxiety   . Breast cancer ([facility]) 10/16/12   left, lower medial, 8 o'clock  . DVT (deep venous thrombosis) ([facility]) 02/2013   RT LEG   . Family history of anesthesia complication    pt's mother has hx. of post-op N/V  . History of radiation therapy 07/06/13-08/20/13   left breast/  . History of transfusion   . Hypertension    under control  with med., has been on med. since 09/2012  . Pain in the wrist    BILATERAL    PAST SURGICAL HISTORY: Past Surgical History:  Procedure Laterality Date  . BREAST BIOPSY Right   . BREAST LUMPECTOMY Left 2014  . BREAST LUMPECTOMY WITH NEEDLE LOCALIZATION AND AXILLARY SENTINEL LYMPH NODE BX Left 12/10/2012   Procedure: BREAST LUMPECTOMY WITH NEEDLE LOCALIZATION AND AXILLARY SENTINEL LYMPH NODE BX;  Surgeon: Merrie Roof, MD;  Location: [facility];  Service: General;  Laterality: Left;  Needle loc BCG 7:30  nuc med 9:30    . CESAREAN [place]  . CHOLECYSTECTOMY  1999  . LEEP N/A 01/26/2014   Procedure: LOOP ELECTROSURGICAL  EXCISION PROCEDURE (LEEP);  Surgeon: Lucita Lora. Alycia Rossetti, MD;  Location: [facility];  Service: Gynecology;  Laterality: N/A;  . OPEN REDUCTION INTERNAL FIXATION (ORIF) TIBIA/FIBULA FRACTURE Right   . PORT-A-CATH REMOVAL    . PORTACATH PLACEMENT Right 01/09/2013   Procedure: INSERTION PORT-A-CATH;  Surgeon: Merrie Roof, MD;  Location: [facility];  Service: General;  Laterality: Right;  . ROBOTIC ASSISTED BILATERAL SALPINGO OOPHERECTOMY Bilateral 01/26/2014   Procedure: ROBOTIC ASSISTED BILATERAL SALPINGO OOPHORECTOMY;  Surgeon: Imagene Gurney A. Alycia Rossetti, MD;  Location: [facility];  Service: Gynecology;  Laterality: Bilateral;  . TONSILLECTOMY AND ADENOIDECTOMY     as a teenager    FAMILY HISTORY Family History  Problem Relation Age of Onset  . Anesthesia problems Mother        post-op N/V  . Breast cancer Maternal Grandmother   . 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 She is divorced. Her daughter, [name] is now in her Sophomore year at [employer] with a minor in art.     ADVANCED DIRECTIVES: (Updated 01/12/2014) Not in place. At her 10/20/2013 visit the patient was given the appropriate documents to complete and notarize at her discretion. She tells me she intends to name her mother, [name], as healthcare power of attorney. She can be reached at [phone]   HEALTH MAINTENANCE: Social History  Substance Use Topics  . Smoking status: Former Smoker    Quit date: 12/03/2012  .  Smokeless tobacco: Never Used  . Alcohol use Yes     Comment: occasionally    Colonoscopy: n/a Pap Smear: 03/2013 Eye Exam: 08/2012 Vitamin D Level: pending Lipid Panel: not  Checked recently   Allergies  Allergen Reactions  . Amoxicillin Other (See Comments)    States "it does not work"    Current Outpatient Prescriptions  Medication Sig Dispense Refill  . b complex vitamins tablet Take 1 tablet by mouth every morning.    Marland Kitchen BIOTIN PO Take 1 tablet by mouth every morning.    . Cholecalciferol 2000 units CAPS Take 1 capsule by mouth daily.    Marland Kitchen letrozole (FEMARA) 2.5 MG tablet Take 1 tablet (2.5 mg total) by mouth daily. 90 tablet 3  . lisinopril (PRINIVIL,ZESTRIL) 10 MG tablet TAKE 1 TABLET (10 MG TOTAL) BY MOUTH EVERY MORNING. [place]  tablet 3  . LORazepam (ATIVAN) 0.5 MG tablet Take 1 tablet (0.5 mg total) by mouth at bedtime as needed (Nausea or vomiting). 30 tablet 0  . venlafaxine XR (EFFEXOR-XR) 37.5 MG 24 hr capsule Take 1 capsule (37.5 mg total) by mouth daily with breakfast. 90 capsule 5   No current facility-administered medications for this visit.     OBJECTIVE: Young white woman in no acute distress  Vitals:   01/07/17 1227  BP: (!) 121/50  Pulse: 78  Resp: 18  Temp: 98.1 F (36.7 C)  SpO2: 98%     Body mass index is 37.97 kg/m.       ECOG FS:0 - Asymptomatic   Sclerae unicteric, pupils round and equal Oropharynx clear and moist No cervical or supraclavicular adenopathy Lungs no rales or rhonchi Heart regular rate and rhythm Abd soft, nontender, positive bowel sounds MSK no focal spinal tenderness, no upper extremity lymphedema Neuro: nonfocal, well oriented, appropriate affect Breasts: The right breast is benign. The left breast is status post lumpectomy and radiation. It is smaller than the right breast and slightly firmer, which are expected changes. Both axillae are benign.  LAB RESULTS:  CMP     Component Value Date/Time   NA 137 01/09/2016 0916    K 4.2 01/09/2016 0916   CL 100 01/21/2014 1530   CO2 26 01/09/2016 0916   GLUCOSE 160 (H) 01/09/2016 0916   BUN 16.3 01/09/2016 0916   CREATININE 0.8 01/09/2016 0916   CALCIUM 9.8 01/09/2016 0916   PROT 7.0 01/09/2016 0916   ALBUMIN 3.7 01/09/2016 0916   AST 25 01/09/2016 0916   ALT 41 01/09/2016 0916   ALKPHOS 101 01/09/2016 0916   BILITOT 1.14 01/09/2016 0916   GFRNONAA 90 (L) 01/21/2014 1530   GFRAA >90 01/21/2014 1530    I No results found for: SPEP  Lab Results  Component Value Date   WBC 8.5 01/07/2017   NEUTROABS 4.8 01/07/2017   HGB 14.1 01/07/2017   HCT 41.1 01/07/2017   MCV 91.6 01/07/2017   PLT 223 01/07/2017      Chemistry      Component Value Date/Time   NA 137 01/09/2016 0916   K 4.2 01/09/2016 0916   CL 100 01/21/2014 1530   CO2 26 01/09/2016 0916   BUN 16.3 01/09/2016 0916   CREATININE 0.8 01/09/2016 0916      Component Value Date/Time   CALCIUM 9.8 01/09/2016 0916   ALKPHOS 101 01/09/2016 0916   AST 25 01/09/2016 0916   ALT 41 01/09/2016 0916   BILITOT 1.14 01/09/2016 0916       No results found for: LABCA2  No components found for: LABCA125  No results for input(s): INR in the last 168 hours.  Urinalysis    Component Value Date/Time   COLORURINE YELLOW 01/21/2014 1503   APPEARANCEUR CLEAR 01/21/2014 1503   LABSPEC 1.014 01/21/2014 1503   LABSPEC 1.010 07/16/2013 1538   PHURINE 6.5 01/21/2014 1503   GLUCOSEU NEGATIVE 01/21/2014 1503   GLUCOSEU Negative 07/16/2013 1538   HGBUR NEGATIVE 01/21/2014 1503   BILIRUBINUR NEGATIVE 01/21/2014 1503   BILIRUBINUR Negative 07/16/2013 1538   KETONESUR NEGATIVE 01/21/2014 1503   PROTEINUR NEGATIVE 01/21/2014 1503   UROBILINOGEN 0.2 01/21/2014 1503   UROBILINOGEN 0.2 07/16/2013 1538   NITRITE NEGATIVE 01/21/2014 1503   LEUKOCYTESUR NEGATIVE 01/21/2014 1503   LEUKOCYTESUR Negative 07/16/2013 1538    STUDIES: Bilateral diagnostic mammography with tomography at [facility] on  12/10/2016 with results that   showed: Breast density B. No evidence of malignancy in either breast.  Most recent bone density scan 08/22/2015 showed a T score of -1.7.  ASSESSMENT: 41 y.o. BRCA negative [place], [place] woman  status post left breast Lower inner quadrant lumpectomy with left axillary sentinel lymph node biopsy 12/10/2012 for a pT2, pN1mi, stage IIB invasive ductal carcinoma, grade 3, estrogen receptor 80% positive, progesterone receptor 80% positive, Ki-67 25%, HER-2/neu negative  #1 Genetic testing performed in [place], [place] reported by the patient as BRCA1 and BRCA2 negative.   #2 s/p adjuvant chemotherapy consisting of FEC (5-FU/epirubicin/Cytoxan) begun on 01/23/2013,completed on 04/03/13 (six cycles).   #3 S/p adjuvant weekly Taxol beginning 04/17/13, received  7 of the 12 planned cycles, last dose 06/05/13 (stopped secondary to skin toxicity)  #6 adjuvant radiation to the left breast and regional lymph nodes completed 08/20/2013  #7 goserelin started 04/17/2013, continued through OCT 2015  (a) s/p BSO 01/26/2014 with benign pathology  #8 anastrozole started 08/14/2013; bone density 08/19/2013 was normal  (a) stopped 04/15/14 because of severe joint stiffness. Switched to letrozole starting 05/03/14  (b) DEXA scan 08/22/2015 showed a T score of -1.7  (c) given history of DVT we are not considering switching to tamoxifen.  #6 Right lower extremity DVT diagnosed on 03/16/2013, patient was placed on Xarelto. Repeat venous Doppler performed in March 2015 was negative for any thrombosis.  Xarelto was stopped Aug 14, 2012.    PLAN:  [name] is now 4 years out from definitive surgery for breast cancer with no evidence of disease recurrence. This is very favorable.  He continues on letrozole, generally with good tolerance. The plan is to continue that for a total of 5 years.  I encouraged her to continue if possible extend her exercise program, particularly walking. She is  under good vitamin D supplementation regimen. Recommend repeat a bone density September 2019 prior to her next visit here.  She still having problems with cramps. We reviewed transplant prevention and treatment techniques.  We also discussed the pelvic health program through physical therapy but at this point she does not feel she needs to participate.  She will see me again in one year she knows to call for any problems that may develop before that visit.     Magrinat, Gustav C, MD  01/07/17 12:42 PM Medical Oncology and Hematology [facility] [address] [place], [place] [address] Tel. [phone]    Fax. [phone]  This document serves as a record of services personally performed by Gustav Magrinat, MD. It was created on her behalf by Soijett Blue, a trained medical scribe. The creation of this record is based on the scribe's personal observations and the provider's statements to them. This document has been checked and approved by the attending provider.  

## 2017-01-07 ENCOUNTER — Other Ambulatory Visit (HOSPITAL_BASED_OUTPATIENT_CLINIC_OR_DEPARTMENT_OTHER): Payer: BLUE CROSS/BLUE SHIELD

## 2017-01-07 ENCOUNTER — Telehealth: Payer: Self-pay | Admitting: Oncology

## 2017-01-07 ENCOUNTER — Ambulatory Visit (HOSPITAL_BASED_OUTPATIENT_CLINIC_OR_DEPARTMENT_OTHER): Payer: BLUE CROSS/BLUE SHIELD | Admitting: Oncology

## 2017-01-07 VITALS — BP 121/50 | HR 78 | Temp 98.1°F | Resp 18 | Ht 68.0 in | Wt 249.7 lb

## 2017-01-07 DIAGNOSIS — C50312 Malignant neoplasm of lower-inner quadrant of left female breast: Secondary | ICD-10-CM

## 2017-01-07 DIAGNOSIS — R87810 Cervical high risk human papillomavirus (HPV) DNA test positive: Secondary | ICD-10-CM

## 2017-01-07 DIAGNOSIS — M8580 Other specified disorders of bone density and structure, unspecified site: Secondary | ICD-10-CM | POA: Diagnosis not present

## 2017-01-07 LAB — COMPREHENSIVE METABOLIC PANEL
ALBUMIN: 3.8 g/dL (ref 3.5–5.0)
ALK PHOS: 99 U/L (ref 40–150)
ALT: 67 U/L — ABNORMAL HIGH (ref 0–55)
AST: 54 U/L — ABNORMAL HIGH (ref 5–34)
Anion Gap: 10 mEq/L (ref 3–11)
BILIRUBIN TOTAL: 1.01 mg/dL (ref 0.20–1.20)
BUN: 15 mg/dL (ref 7.0–26.0)
CALCIUM: 9.4 mg/dL (ref 8.4–10.4)
CO2: 26 mEq/L (ref 22–29)
Chloride: 99 mEq/L (ref 98–109)
Creatinine: 0.9 mg/dL (ref 0.6–1.1)
EGFR: >60 mL/min/{1.73_m2} (ref >60)
GLUCOSE: 339 mg/dL — AB (ref 70–140)
POTASSIUM: 4.1 meq/L (ref 3.5–5.1)
SODIUM: 135 meq/L — AB (ref 136–145)
TOTAL PROTEIN: 6.9 g/dL (ref 6.4–8.3)

## 2017-01-07 LAB — CBC WITH DIFFERENTIAL/PLATELET
BASO%: 0.7 % (ref 0.0–2.0)
Basophils Absolute: 0.1 10*3/uL (ref 0.0–0.1)
EOS%: 1.7 % (ref 0.0–7.0)
Eosinophils Absolute: 0.1 10*3/uL (ref 0.0–0.5)
HCT: 41.1 % (ref 34.8–46.6)
HGB: 14.1 g/dL (ref 11.6–15.9)
LYMPH#: 3 10*3/uL (ref 0.9–3.3)
LYMPH%: 35.5 % (ref 14.0–49.7)
MCH: 31.5 pg (ref 25.1–34.0)
MCHC: 34.4 g/dL (ref 31.5–36.0)
MCV: 91.6 fL (ref 79.5–101.0)
MONO#: 0.5 10*3/uL (ref 0.1–0.9)
MONO%: 5.6 % (ref 0.0–14.0)
NEUT%: 56.5 % (ref 38.4–76.8)
NEUTROS ABS: 4.8 10*3/uL (ref 1.5–6.5)
Platelets: 223 10*3/uL (ref 145–400)
RBC: 4.49 10*6/uL (ref 3.70–5.45)
RDW: 13.1 % (ref 11.2–14.5)
WBC: 8.5 10*3/uL (ref 3.9–10.3)

## 2017-01-07 NOTE — Telephone Encounter (Signed)
Scheduled per 10/12 los. Patient did not want avs or calendar.

## 2017-01-08 ENCOUNTER — Telehealth: Payer: Self-pay

## 2017-01-08 LAB — VITAMIN D 25 HYDROXY (VIT D DEFICIENCY, FRACTURES): VIT D 25 HYDROXY: 47.4 ng/mL (ref 30.0–100.0)

## 2017-01-08 NOTE — Telephone Encounter (Signed)
Called and spoke with patient regarding glucose results.  She was surprised at reading.  LPN asked if patient had PCP we could fax labs to for follow up but at this time she does not.  She said she needs to find one at this point.  Patient and LPN discussed mailing as an option for getting her lab work to her for convenience until she can find PCP.  Copy of labs will sent out in mail today.

## 2017-01-08 NOTE — Telephone Encounter (Signed)
-----   Message from Gardenia Phlegm, NP sent at 01/07/2017  4:26 PM EDT -----  Please call patient with her elevated blood sugar results and fax to her PCP ----- Message ----- From: Interface, Lab In Three Zero One Sent: 01/07/2017  12:19 PM To: Chauncey Cruel, MD

## 2017-01-21 LAB — HEMOGLOBIN A1C: HEMOGLOBIN A1C: 9.8

## 2017-02-11 ENCOUNTER — Other Ambulatory Visit: Payer: Self-pay | Admitting: *Deleted

## 2017-02-11 MED ORDER — LISINOPRIL 10 MG PO TABS: ORAL_TABLET | ORAL | 0 refills | Status: DC

## 2017-02-11 MED ORDER — LETROZOLE 2.5 MG PO TABS: 2.5000 mg | ORAL_TABLET | Freq: Every day | ORAL | 3 refills | Status: DC

## 2017-02-12 ENCOUNTER — Other Ambulatory Visit: Payer: Self-pay

## 2017-02-13 ENCOUNTER — Other Ambulatory Visit: Payer: Self-pay

## 2017-02-13 MED ORDER — VENLAFAXINE HCL ER 37.5 MG PO CP24: 37.5000 mg | ORAL_CAPSULE | Freq: Every day | ORAL | 5 refills | Status: DC

## 2017-02-28 ENCOUNTER — Encounter: Payer: Self-pay | Admitting: "Endocrinology

## 2017-02-28 ENCOUNTER — Ambulatory Visit: Payer: BLUE CROSS/BLUE SHIELD | Admitting: "Endocrinology

## 2017-02-28 VITALS — BP 126/71 | HR 80 | Ht 68.0 in | Wt 241.0 lb

## 2017-02-28 DIAGNOSIS — I10 Essential (primary) hypertension: Secondary | ICD-10-CM | POA: Diagnosis not present

## 2017-02-28 DIAGNOSIS — E6609 Other obesity due to excess calories: Secondary | ICD-10-CM | POA: Insufficient documentation

## 2017-02-28 DIAGNOSIS — Z6835 Body mass index (BMI) 35.0-35.9, adult: Secondary | ICD-10-CM

## 2017-02-28 DIAGNOSIS — E1165 Type 2 diabetes mellitus with hyperglycemia: Secondary | ICD-10-CM | POA: Insufficient documentation

## 2017-02-28 DIAGNOSIS — E66812 Obesity, class 2: Secondary | ICD-10-CM | POA: Insufficient documentation

## 2017-02-28 NOTE — Progress Notes (Signed)
Consult Note       02/28/2017, 1:23 PM   Subjective:    Patient ID: [name], female    DOB: [birth year].  [name] is being seen in consultation for management of currently uncontrolled symptomatic diabetes requested by  Marylee Floras, FNP.   Past Medical History:  Diagnosis Date  . Anxiety   . Breast cancer ([place]) 10/16/12   left, lower medial, 8 o'clock  . DVT (deep venous thrombosis) ([place]) 02/2013   RT LEG   . Family history of anesthesia complication    pt's mother has hx. of post-op N/V  . History of radiation therapy 07/06/13-08/20/13   left breast/  . History of transfusion   . Hypertension    under control with med., has been on med. since 09/2012  . Pain in the wrist    BILATERAL   Past Surgical History:  Procedure Laterality Date  . BREAST BIOPSY Right   . BREAST LUMPECTOMY Left 2014  . BREAST LUMPECTOMY WITH NEEDLE LOCALIZATION AND AXILLARY SENTINEL LYMPH NODE BX Left 12/10/2012   Procedure: BREAST LUMPECTOMY WITH NEEDLE LOCALIZATION AND AXILLARY SENTINEL LYMPH NODE BX;  Surgeon: Merrie Roof, MD;  Location: [facility];  Service: General;  Laterality: Left;  Needle loc BCG 7:30  nuc med 9:30    . CESAREAN [place]  . CHOLECYSTECTOMY  1999  . LEEP N/A 01/26/2014   Procedure: LOOP ELECTROSURGICAL EXCISION PROCEDURE (LEEP);  Surgeon: Lucita Lora. Alycia Rossetti, MD;  Location: [facility];  Service: Gynecology;  Laterality: N/A;  . OPEN REDUCTION INTERNAL FIXATION (ORIF) TIBIA/FIBULA FRACTURE Right   . PORT-A-CATH REMOVAL    . PORTACATH PLACEMENT Right 01/09/2013   Procedure: INSERTION PORT-A-CATH;  Surgeon: Merrie Roof, MD;  Location: [facility];  Service: General;  Laterality: Right;  . ROBOTIC ASSISTED BILATERAL SALPINGO OOPHERECTOMY Bilateral 01/26/2014   Procedure: ROBOTIC ASSISTED BILATERAL SALPINGO OOPHORECTOMY;  Surgeon: Imagene Gurney A. Alycia Rossetti, MD;  Location: [facility];   Service: Gynecology;  Laterality: Bilateral;  . TONSILLECTOMY AND ADENOIDECTOMY     as a teenager   Social History   Socioeconomic History  . Marital status: Divorced    Spouse name: None  . Number of children: None  . Years of education: None  . Highest education level: None  Social Needs  . Financial resource strain: None  . Food insecurity - worry: None  . Food insecurity - inability: None  . Transportation needs - medical: None  . Transportation needs - non-medical: None  Occupational History  . None  Tobacco Use  . Smoking status: Former Smoker    Last attempt to quit: 12/03/2012    Years since quitting: 4.2  . Smokeless tobacco: Never Used  Substance and Sexual Activity  . Alcohol use: Yes    Comment: occasionally  . Drug use: No  . Sexual activity: Yes    Birth control/protection: Condom    Comment: menarche age 65, P30 @ age 41,  last menstrual cycle 09/29/12  Other Topics Concern  . None  Social History Narrative  . None   Outpatient Encounter Medications as of 02/28/2017  Medication Sig  . b complex vitamins tablet Take 1 tablet by mouth every morning.  . Cholecalciferol 2000 units CAPS Take 1 capsule by mouth daily.  Marland Kitchen letrozole (FEMARA) 2.5 MG tablet Take 2.5 mg by mouth daily.  Marland Kitchen lisinopril (PRINIVIL,ZESTRIL) 10 MG tablet TAKE 1 TABLET (10 MG TOTAL) BY MOUTH EVERY MORNING.  . metFORMIN (GLUCOPHAGE) 500 MG tablet Take 500 mg by mouth 2 (two) times daily with a meal.  . venlafaxine XR (EFFEXOR-XR) 37.5 MG 24 hr capsule Take 1 capsule (37.5 mg total) by mouth daily with breakfast.  . BIOTIN PO Take 1 tablet by mouth every morning.  Marland Kitchen LORazepam (ATIVAN) 0.5 MG tablet Take 1 tablet (0.5 mg total) by mouth at bedtime as needed (Nausea or vomiting).  . [DISCONTINUED] letrozole (FEMARA) 2.5 MG tablet Take 1 tablet (2.5 mg total) by mouth daily.   No facility-administered encounter medications on file as of 02/28/2017.     ALLERGIES: Allergies  Allergen  Reactions  . Amoxicillin Other (See Comments)    States "it does not work"    VACCINATION STATUS:  There is no immunization history on file for this patient.  Diabetes  She presents for her initial diabetic visit. She has type 2 diabetes mellitus. Onset time: She was diagnosed at approximate age of 33 years. This is following exposure to high-dose steroids related to treatment for breast cancer. Her disease course has been improving. There are no hypoglycemic associated symptoms. Pertinent negatives for hypoglycemia include no confusion, headaches, pallor or seizures. Associated symptoms include fatigue, polydipsia and polyuria. Pertinent negatives for diabetes include no chest pain and no polyphagia. There are no hypoglycemic complications. Symptoms are improving. There are no diabetic complications. Risk factors for coronary artery disease include diabetes mellitus, hypertension, obesity, tobacco exposure and sedentary lifestyle. Current diabetic treatment includes oral agent (monotherapy). Weight trend: After her diagnosis, she made significant adjustment in her diet. She lost 8 pounds over the last several weeks. She is following a generally unhealthy diet. When asked about meal planning, she reported none. She has not had a previous visit with a dietitian. She participates in exercise intermittently. Her overall blood glucose range is 140-180 mg/dl. An ACE inhibitor/angiotensin II receptor blocker is being taken. She does not see a podiatrist.Eye exam is not current.  Hypertension  This is a chronic problem. The current episode started more than 1 year ago. The problem is controlled. Pertinent negatives include no chest pain, headaches, palpitations or shortness of breath. Risk factors for coronary artery disease include diabetes mellitus, obesity, sedentary lifestyle, smoking/tobacco exposure and family history. Past treatments include ACE inhibitors.      Review of Systems  Constitutional:  Positive for fatigue. Negative for chills, fever and unexpected weight change.  HENT: Negative for trouble swallowing and voice change.   Eyes: Negative for visual disturbance.  Respiratory: Negative for cough, shortness of breath and wheezing.   Cardiovascular: Negative for chest pain, palpitations and leg swelling.  Gastrointestinal: Negative for diarrhea, nausea and vomiting.  Endocrine: Positive for polydipsia and polyuria. Negative for cold intolerance, heat intolerance and polyphagia.  Musculoskeletal: Negative for arthralgias and myalgias.  Skin: Negative for color change, pallor, rash and wound.  Neurological: Negative for seizures and headaches.  Psychiatric/Behavioral: Negative for confusion and suicidal ideas.    Objective:    BP 126/71   Pulse 80   Ht _0  (1.727 m)   Wt 241 lb (109.3 kg)   BMI 36.64 kg/m   Wt Readings from Last  3 Encounters:  02/28/17 241 lb (109.3 kg)  01/07/17 249 lb 11.2 oz (113.3 kg)  01/09/16 255 lb 11.2 oz (116 kg)     Physical Exam  Constitutional: She is oriented to person, place, and time. She appears well-developed.  HENT:  Head: Normocephalic and atraumatic.  Eyes: EOM are normal.  Neck: Normal range of motion. Neck supple. No tracheal deviation present. No thyromegaly present.  Cardiovascular: Normal rate and regular rhythm.  Pulmonary/Chest: Effort normal and breath sounds normal.  Abdominal: Soft. Bowel sounds are normal. There is no tenderness. There is no guarding.  Musculoskeletal: Normal range of motion. She exhibits no edema.  Neurological: She is alert and oriented to person, place, and time. She has normal reflexes. No cranial nerve deficit. Coordination normal.  Skin: Skin is warm and dry. No rash noted. No erythema. No pallor.  Psychiatric: She has a normal mood and affect. Judgment normal.    Recent Results (from the past 2160 hour(s))  Comprehensive metabolic panel     Status: Abnormal   Collection Time: 01/07/17  12:12 PM  Result Value Ref Range   Sodium 135 (L) 136 - 145 mEq/L   Potassium 4.1 3.5 - 5.1 mEq/L   Chloride 99 98 - 109 mEq/L   CO2 26 22 - 29 mEq/L   Glucose 339 (H) 70 - 140 mg/dl    Comment: Glucose reference range is for nonfasting patients. Fasting glucose reference range is 70- 100.   BUN 15.0 7.0 - 26.0 mg/dL   Creatinine 0.9 0.6 - 1.1 mg/dL   Total Bilirubin 1.01 0.20 - 1.20 mg/dL   Alkaline Phosphatase 99 40 - 150 U/L   AST 54 (H) 5 - 34 U/L   ALT 67 (H) 0 - 55 U/L   Total Protein 6.9 6.4 - 8.3 g/dL   Albumin 3.8 3.5 - 5.0 g/dL   Calcium 9.4 8.4 - 10.4 mg/dL   Anion Gap 10 3 - 11 mEq/L   EGFR >60 >60 ml/min/1.73 m2    Comment: eGFR is calculated using the CKD-EPI Creatinine Equation (2009)  CBC with Differential     Status: None   Collection Time: 01/07/17 12:12 PM  Result Value Ref Range   WBC 8.5 3.9 - 10.3 10e3/uL   NEUT# 4.8 1.5 - 6.5 10e3/uL   HGB 14.1 11.6 - 15.9 g/dL   HCT 41.1 34.8 - 46.6 %   Platelets 223 145 - 400 10e3/uL   MCV 91.6 79.5 - 101.0 fL   MCH 31.5 25.1 - 34.0 pg   MCHC 34.4 31.5 - 36.0 g/dL   RBC 4.49 3.70 - 5.45 10e6/uL   RDW 13.1 11.2 - 14.5 %   lymph# 3.0 0.9 - 3.3 10e3/uL   MONO# 0.5 0.1 - 0.9 10e3/uL   Eosinophils Absolute 0.1 0.0 - 0.5 10e3/uL   Basophils Absolute 0.1 0.0 - 0.1 10e3/uL   NEUT% 56.5 38.4 - 76.8 %   LYMPH% 35.5 14.0 - 49.7 %   MONO% 5.6 0.0 - 14.0 %   EOS% 1.7 0.0 - 7.0 %   BASO% 0.7 0.0 - 2.0 %  VITAMIN D 25 Hydroxy (Vit-D Deficiency, Fractures)     Status: None   Collection Time: 01/07/17 12:12 PM  Result Value Ref Range   Vitamin D, 25-Hydroxy 47.4 30.0 - 100.0 ng/mL    Comment: Vitamin D deficiency has been defined by the Institute of Medicine and an Endocrine Society practice guideline as a level of serum 25-OH vitamin D less than 20  ng/mL (1,2). The Endocrine Society went on to further define vitamin D insufficiency as a level between 21 and 29 ng/mL (2). 1. IOM (Institute of Medicine). 2010. Dietary  reference    intakes for calcium and D. [place]: The    [employer]. 2. Holick MF, Binkley Harvey, Bischoff-Ferrari HA, et al.    Evaluation, treatment, and prevention of vitamin D    deficiency: an Endocrine Society clinical practice    guideline. JCEM. 2011 Jul; 96(7):1911-30.   Hemoglobin A1c     Status: None   Collection Time: 01/21/17 12:00 AM  Result Value Ref Range   Hemoglobin A1C 9.8      Assessment & Plan:   1. Uncontrolled type 2 diabetes mellitus with hyperglycemia ([place])  - [name] has currently uncontrolled symptomatic type 2 DM since  41 years of age,  with most recent A1c of 9.8 %. Recent labs reviewed.  - She made significant adjustment in her diet since diagnosis and saw significant improvement in her blood glucose profile in recent weeks. She does not report any gross competitions from her diabetes, however [name] remains at a high risk for more acute and chronic complications which include CAD, CVA, CKD, retinopathy, and neuropathy. These are all discussed in detail with the patient.  - I have counseled her on diet management and weight loss, by adopting a carbohydrate restricted/protein rich diet.  - Suggestion is made for her to avoid simple carbohydrates  from her diet including Cakes, Sweet Desserts, Ice Cream, Soda (diet and regular), Sweet Tea, Candies, Chips, Cookies, Store Bought Juices, Alcohol in Excess of  1-2 drinks a day, Artificial Sweeteners, and "Sugar-free" Products. This will help patient to have stable blood glucose profile and potentially avoid unintended weight gain.  - I encouraged her to switch to  unprocessed or minimally processed complex starch and increased protein intake (animal or plant source), fruits, and vegetables.  - she is advised to stick to a routine mealtimes to eat 3 meals  a day and avoid unnecessary snacks ( to snack only to correct hypoglycemia).   - she will be scheduled with Jearld Fenton,  RDN, CDE for individualized diabetes education.  - I have approached her with the following individualized plan to manage diabetes and patient agrees:   - Given her appropriate engagement and recent improvement in her glycemic profile, she will not require additional treatment for diabetes at this time.  - I will continue metformin 500 mg by mouth twice a day, therapeutically suitable for patient . - She will have repeat labs in 3 months, she has multiple options if she does not achieve control of diabetes target including incretin therapy as appropriate next visit. - Patient specific target  A1c;  LDL, HDL, Triglycerides, and  Waist Circumference were discussed in detail.  2) BP/HTN: Controlled. Continue current medications including ACEI/ARB. 3) Lipids/HPL:   Fasting lipid panel not available to review.   Patient is not on  statins. 4)  Weight/Diet: CDE Consult will be initiated , exercise, and detailed carbohydrates information provided.  5) Chronic Care/Health Maintenance:  -she  is on ACEI medications and  is encouraged to continue to follow up with Ophthalmology, Dentist,  Podiatrist at least yearly or according to recommendations, and advised to  Quit smoking. I have recommended yearly flu vaccine and pneumonia vaccination at least every 5 years; moderate intensity exercise for up to 150 minutes weekly; and  sleep for at least 7 hours a day.  -  I advised patient to maintain close follow up with Marylee Floras, FNP for primary care needs.  - Time spent with the patient: 1 hour, of which >50% was spent in obtaining information about her symptoms, reviewing her previous labs, evaluations, and treatments, counseling her about her  currently uncontrolled type 2 diabetes, hypertension, and developing a plan for long term treatment; her  questions were answered to her satisfaction.  Follow up plan: - Return in about 3 months (around 05/29/2017) for follow up with pre-visit labs.  Glade Lloyd,  MD [facility] [address] [place], [place] [address] Phone: [phone]  Fax: [phone]    02/28/2017, 1:23 PM  This note was partially dictated with voice recognition software. Similar sounding words can be transcribed inadequately or may not  be corrected upon review.

## 2017-02-28 NOTE — Patient Instructions (Signed)

## 2017-04-10 ENCOUNTER — Telehealth: Payer: Self-pay | Admitting: "Endocrinology

## 2017-04-10 ENCOUNTER — Encounter: Payer: BLUE CROSS/BLUE SHIELD | Attending: "Endocrinology | Admitting: Nutrition

## 2017-04-10 ENCOUNTER — Telehealth: Payer: Self-pay | Admitting: Nutrition

## 2017-04-10 ENCOUNTER — Encounter: Payer: Self-pay | Admitting: Nutrition

## 2017-04-10 VITALS — Ht 66.0 in | Wt 234.6 lb

## 2017-04-10 DIAGNOSIS — E669 Obesity, unspecified: Secondary | ICD-10-CM

## 2017-04-10 DIAGNOSIS — E118 Type 2 diabetes mellitus with unspecified complications: Principal | ICD-10-CM

## 2017-04-10 DIAGNOSIS — IMO0002 Reserved for concepts with insufficient information to code with codable children: Secondary | ICD-10-CM

## 2017-04-10 DIAGNOSIS — E1165 Type 2 diabetes mellitus with hyperglycemia: Secondary | ICD-10-CM

## 2017-04-10 MED ORDER — METFORMIN HCL 500 MG PO TABS: 500.0000 mg | ORAL_TABLET | Freq: Two times a day (BID) | ORAL | 0 refills | Status: DC

## 2017-04-10 NOTE — Progress Notes (Signed)
Diabetes Self-Management Education  Visit Type: First/Initial  Appt. Start Time: 1500  Appt. End Time: 1630  04/10/2017  [name], identified by name and date of birth, is a 42 y.o. female with a diagnosis of Diabetes: Type 2. Here with her Mom today. Sees Dr. Dorris Fetch for Endocrinology. S/p Breast cancer. Type 2 DM for the last 2 years. On Metformin 500 mg BID. Has lost 7 lbs since last visit with Dr. Dorris Fetch and about 20+ lbs in the last 6 months. Motivated and engaged to make changes with diet and exercise for needed weight loss and improved blood sugars.  ASSESSMENT  Height 5\' 6"  (1.676 m), weight 234 lb 9.6 oz (106.4 kg). Body mass index is 37.87 kg/m.  Diabetes Self-Management Education - 04/10/17 1520      Visit Information   Visit Type  First/Initial      Initial Visit   Diabetes Type  Type 2    Are you currently following a meal plan?  No    Are you taking your medications as prescribed?  Yes    Date Diagnosed  2018      Health Coping   How would you rate your overall health?  Good      Psychosocial Assessment   Patient Belief/Attitude about Diabetes  Motivated to manage diabetes    Self-care barriers  None    Self-management support  Family    Other persons present  Patient    Patient Concerns  Nutrition/Meal planning;Medication;Monitoring;Healthy Lifestyle;Glycemic Control;Weight Control    Special Needs  None    Preferred Learning Style  No preference indicated    Learning Readiness  Change in progress    How often do you need to have someone help you when you read instructions, pamphlets, or other written materials from your doctor or pharmacy?  1 - Never    What is the last grade level you completed in school?  12      Pre-Education Assessment   Patient understands the diabetes disease and treatment process.  Needs Review    Patient understands incorporating nutritional management into lifestyle.  Needs Review    Patient undertands incorporating  physical activity into lifestyle.  Needs Review    Patient understands using medications safely.  Needs Review    Patient understands monitoring blood glucose, interpreting and using results  Needs Review    Patient understands prevention, detection, and treatment of acute complications.  Needs Review    Patient understands prevention, detection, and treatment of chronic complications.  Needs Review    Patient understands how to develop strategies to address psychosocial issues.  Needs Review    Patient understands how to develop strategies to promote health/change behavior.  Needs Review      Complications   Last HgB A1C per patient/outside source  9.8 %    How often do you check your blood sugar?  1-2 times/day    Fasting Blood glucose range (mg/dL)  130-179    Number of hypoglycemic episodes per month  0    Number of hyperglycemic episodes per week  0    Have you had a dilated eye exam in the past 12 months?  Yes    Have you had a dental exam in the past 12 months?  Yes    Are you checking your feet?  Yes    How many days per week are you checking your feet?  7      Dietary Intake   Breakfast  skips;  or coffee banana or 1-2 slices high fiber bread and egg.     Lunch  frozen stouffers lasagna and 2 slices ww toast, water    Dinner  2 pieces of chicken, coke zero    Beverage(s)  water- 64 oz per day.       Exercise   Exercise Type  Light (walking / raking leaves)    How many days per week to you exercise?  43    How many minutes per day do you exercise?  30    Total minutes per week of exercise  1290      Patient Education   Previous Diabetes Education  No    Disease state   Factors that contribute to the development of diabetes;Definition of diabetes, type 1 and 2, and the diagnosis of diabetes;Explored patient's options for treatment of their diabetes    Nutrition management   Role of diet in the treatment of diabetes and the relationship between the three main macronutrients  and blood glucose level;Food label reading, portion sizes and measuring food.;Carbohydrate counting;Reviewed blood glucose goals for pre and post meals and how to evaluate the patients' food intake on their blood glucose level.    Physical activity and exercise   Role of exercise on diabetes management, blood pressure control and cardiac health.;Identified with patient nutritional and/or medication changes necessary with exercise.;Helped patient identify appropriate exercises in relation to his/her diabetes, diabetes complications and other health issue.    Medications  Reviewed patients medication for diabetes, action, purpose, timing of dose and side effects.    Monitoring  Taught/evaluated SMBG meter.;Purpose and frequency of SMBG.    Acute complications  Taught treatment of hypoglycemia - the 15 rule.;Discussed and identified patients' treatment of hyperglycemia.;Covered sick day management with medication and food.    Chronic complications  Relationship between chronic complications and blood glucose control;Assessed and discussed foot care and prevention of foot problems;Identified and discussed with patient  current chronic complications;Lipid levels, blood glucose control and heart disease;Retinopathy and reason for yearly dilated eye exams;Dental care    Psychosocial adjustment  Worked with patient to identify barriers to care and solutions    Personal strategies to promote health  Lifestyle issues that need to be addressed for better diabetes care      Individualized Goals (developed by patient)   Nutrition  Follow meal plan discussed;General guidelines for healthy choices and portions discussed    Physical Activity  30 minutes per day;Exercise 5-7 days per week    Monitoring   test my blood glucose as discussed    Reducing Risk  examine blood glucose patterns;do foot checks daily;get labs drawn      Post-Education Assessment   Patient understands the diabetes disease and treatment process.   Needs Review    Patient understands incorporating nutritional management into lifestyle.  Needs Review    Patient undertands incorporating physical activity into lifestyle.  Needs Review    Patient understands using medications safely.  Needs Review    Patient understands monitoring blood glucose, interpreting and using results  Needs Review    Patient understands prevention, detection, and treatment of acute complications.  Needs Review    Patient understands prevention, detection, and treatment of chronic complications.  Needs Review    Patient understands how to develop strategies to address psychosocial issues.  Needs Review    Patient understands how to develop strategies to promote health/change behavior.  Needs Review      Outcomes   Expected Outcomes  Demonstrated interest in learning. Expect positive outcomes    Future DMSE  2 months    Program Status  Completed       Individualized Plan for Diabetes Self-Management Training:   Learning Objective:  Patient will have a greater understanding of diabetes self-management. Patient education plan is to attend individual and/or group sessions per assessed needs and concerns.   Plan:   Patient Instructions  Goals 1. Follow MY Plate 2. Eat 2-3 carb choices per meal 3. Drink only water and cut out diet sodas. 4. Exercise 150 minutes a week. 5. Keep FBS less than 130 and less than 150 at bedtimes.  Get A1C less than 7%.    Expected Outcomes:  Demonstrated interest in learning. Expect positive outcomes  Education material provided: Living Well with Diabetes, Food label handouts, A1C conversion sheet, Meal plan card, My Plate and Carbohydrate counting sheet  If problems or questions, patient to contact team via:  Phone and Email  Future DSME appointment: 2 months

## 2017-04-10 NOTE — Patient Instructions (Signed)
Goals 1. Follow MY Plate 2. Eat 2-3 carb choices per meal 3. Drink only water and cut out diet sodas. 4. Exercise 150 minutes a week. 5. Keep FBS less than 130 and less than 150 at bedtimes.  Get A1C less than 7%.

## 2017-04-10 NOTE — Telephone Encounter (Signed)
Reminded pt of her appt. Today.

## 2017-04-10 NOTE — Telephone Encounter (Signed)
[name] is asking for a refill on metFORMIN (GLUCOPHAGE) 500 MG tablet sent to [facility] in [place]

## 2017-05-31 ENCOUNTER — Ambulatory Visit: Payer: BLUE CROSS/BLUE SHIELD | Admitting: "Endocrinology

## 2017-05-31 LAB — COMPLETE METABOLIC PANEL WITH GFR
AG Ratio: 1.9 (calc) (ref 1.0–2.5)
ALKALINE PHOSPHATASE (APISO): 64 U/L (ref 33–115)
ALT: 20 U/L (ref 6–29)
AST: 16 U/L (ref 10–30)
Albumin: 4.1 g/dL (ref 3.6–5.1)
BUN: 16 mg/dL (ref 7–25)
CO2: 27 mmol/L (ref 20–32)
CREATININE: 0.65 mg/dL (ref 0.50–1.10)
Calcium: 9.5 mg/dL (ref 8.6–10.2)
Chloride: 108 mmol/L (ref 98–110)
GFR, Est African American: 127 mL/min/{1.73_m2} (ref >=60)
GFR, Est Non African American: 110 mL/min/{1.73_m2} (ref >=60)
Globulin: 2.2 g/dL (calc) (ref 1.9–3.7)
Glucose, Bld: 121 mg/dL — ABNORMAL HIGH (ref 65–99)
Potassium: 4.4 mmol/L (ref 3.5–5.3)
Sodium: 142 mmol/L (ref 135–146)
Total Bilirubin: 0.7 mg/dL (ref 0.2–1.2)
Total Protein: 6.3 g/dL (ref 6.1–8.1)

## 2017-05-31 LAB — LIPID PANEL
CHOL/HDL RATIO: 3.9 (calc) (ref <5.0)
Cholesterol: 164 mg/dL (ref <200)
HDL: 42 mg/dL — ABNORMAL LOW (ref >50)
LDL CHOLESTEROL (CALC): 94 mg/dL
NON-HDL CHOLESTEROL (CALC): 122 mg/dL (ref <130)
Triglycerides: 190 mg/dL — ABNORMAL HIGH (ref <150)

## 2017-05-31 LAB — T4, FREE: FREE T4: 1.1 ng/dL (ref 0.8–1.8)

## 2017-05-31 LAB — HEMOGLOBIN A1C
EAG (MMOL/L): 5.8 (calc)
Hgb A1c MFr Bld: 5.3 % of total Hgb (ref <5.7)
Mean Plasma Glucose: 105 (calc)

## 2017-05-31 LAB — VITAMIN D 25 HYDROXY (VIT D DEFICIENCY, FRACTURES): Vit D, 25-Hydroxy: 50 ng/mL (ref 30–100)

## 2017-05-31 LAB — TSH: TSH: 0.89 mIU/L

## 2017-06-03 ENCOUNTER — Encounter: Payer: Self-pay | Admitting: "Endocrinology

## 2017-06-03 ENCOUNTER — Ambulatory Visit (INDEPENDENT_AMBULATORY_CARE_PROVIDER_SITE_OTHER): Payer: BLUE CROSS/BLUE SHIELD | Admitting: "Endocrinology

## 2017-06-03 VITALS — BP 110/75 | HR 92 | Ht 68.0 in | Wt 234.0 lb

## 2017-06-03 DIAGNOSIS — E6609 Other obesity due to excess calories: Secondary | ICD-10-CM | POA: Diagnosis not present

## 2017-06-03 DIAGNOSIS — Z6835 Body mass index (BMI) 35.0-35.9, adult: Secondary | ICD-10-CM | POA: Diagnosis not present

## 2017-06-03 DIAGNOSIS — E1165 Type 2 diabetes mellitus with hyperglycemia: Secondary | ICD-10-CM | POA: Diagnosis not present

## 2017-06-03 DIAGNOSIS — I10 Essential (primary) hypertension: Secondary | ICD-10-CM

## 2017-06-03 NOTE — Progress Notes (Signed)
Consult Note       06/03/2017, 4:59 PM   Subjective:    Patient ID: [name], female    DOB: Jun 19, 1975.  [name] is being seen in consultation for management of currently uncontrolled symptomatic diabetes requested by  Marylee Floras, FNP.   Past Medical History:  Diagnosis Date  . Anxiety   . Breast cancer ([place]) 10/16/12   left, lower medial, 8 o'clock  . DVT (deep venous thrombosis) ([place]) 02/2013   RT LEG   . Family history of anesthesia complication    pt's mother has hx. of post-op N/V  . History of radiation therapy 07/06/13-08/20/13   left breast/  . History of transfusion   . Hypertension    under control with med., has been on med. since 09/2012  . Pain in the wrist    BILATERAL   Past Surgical History:  Procedure Laterality Date  . BREAST BIOPSY Right   . BREAST LUMPECTOMY Left 2014  . BREAST LUMPECTOMY WITH NEEDLE LOCALIZATION AND AXILLARY SENTINEL LYMPH NODE BX Left 12/10/2012   Procedure: BREAST LUMPECTOMY WITH NEEDLE LOCALIZATION AND AXILLARY SENTINEL LYMPH NODE BX;  Surgeon: Merrie Roof, MD;  Location: [facility];  Service: General;  Laterality: Left;  Needle loc BCG 7:30  nuc med 9:30    . CESAREAN [place]  . CHOLECYSTECTOMY  1999  . LEEP N/A 01/26/2014   Procedure: LOOP ELECTROSURGICAL EXCISION PROCEDURE (LEEP);  Surgeon: Lucita Lora. Alycia Rossetti, MD;  Location: [facility];  Service: Gynecology;  Laterality: N/A;  . OPEN REDUCTION INTERNAL FIXATION (ORIF) TIBIA/FIBULA FRACTURE Right   . PORT-A-CATH REMOVAL    . PORTACATH PLACEMENT Right 01/09/2013   Procedure: INSERTION PORT-A-CATH;  Surgeon: Merrie Roof, MD;  Location: [facility];  Service: General;  Laterality: Right;  . ROBOTIC ASSISTED BILATERAL SALPINGO OOPHERECTOMY Bilateral 01/26/2014   Procedure: ROBOTIC ASSISTED BILATERAL SALPINGO OOPHORECTOMY;  Surgeon: Imagene Gurney A. Alycia Rossetti, MD;  Location: [facility];   Service: Gynecology;  Laterality: Bilateral;  . TONSILLECTOMY AND ADENOIDECTOMY     as a teenager   Social History   Socioeconomic History  . Marital status: Divorced    Spouse name: None  . Number of children: None  . Years of education: None  . Highest education level: None  Social Needs  . Financial resource strain: None  . Food insecurity - worry: None  . Food insecurity - inability: None  . Transportation needs - medical: None  . Transportation needs - non-medical: None  Occupational History  . None  Tobacco Use  . Smoking status: Former Smoker    Last attempt to quit: 12/03/2012    Years since quitting: 4.5  . Smokeless tobacco: Never Used  Substance and Sexual Activity  . Alcohol use: Yes    Comment: occasionally  . Drug use: No  . Sexual activity: Yes    Birth control/protection: Condom    Comment: menarche age 42, P45 @ age 76,  last menstrual cycle 09/29/12  Other Topics Concern  . None  Social History Narrative  . None   Outpatient Encounter Medications as of 06/03/2017  Medication Sig  . b complex vitamins tablet Take 1 tablet by mouth every morning.  Marland Kitchen BIOTIN PO Take 1 tablet by mouth every morning.  . Cholecalciferol 2000 units CAPS Take 1 capsule by mouth daily.  Marland Kitchen letrozole (FEMARA) 2.5 MG tablet Take 2.5 mg by mouth daily.  Marland Kitchen lisinopril (PRINIVIL,ZESTRIL) 10 MG tablet TAKE 1 TABLET (10 MG TOTAL) BY MOUTH EVERY MORNING.  . LORazepam (ATIVAN) 0.5 MG tablet Take 1 tablet (0.5 mg total) by mouth at bedtime as needed (Nausea or vomiting). (Patient not taking: Reported on 04/10/2017)  . metFORMIN (GLUCOPHAGE) 500 MG tablet Take 1 tablet (500 mg total) by mouth 2 (two) times daily with a meal.  . venlafaxine XR (EFFEXOR-XR) 37.5 MG 24 hr capsule Take 1 capsule (37.5 mg total) by mouth daily with breakfast.   No facility-administered encounter medications on file as of 06/03/2017.     ALLERGIES: Allergies  Allergen Reactions  . Amoxicillin Other (See Comments)     States "it does not work"    VACCINATION STATUS:  There is no immunization history on file for this patient.  Diabetes  She presents for her follow-up diabetic visit. She has type 2 diabetes mellitus. Onset time: She was diagnosed at approximate age of 27 years. This is following exposure to high-dose steroids related to treatment for breast cancer. Her disease course has been improving. There are no hypoglycemic associated symptoms. Pertinent negatives for hypoglycemia include no confusion, headaches, pallor or seizures. Associated symptoms include fatigue, polydipsia and polyuria. Pertinent negatives for diabetes include no chest pain and no polyphagia. There are no hypoglycemic complications. Symptoms are improving. There are no diabetic complications. Risk factors for coronary artery disease include diabetes mellitus, hypertension, obesity, tobacco exposure and sedentary lifestyle. Current diabetic treatment includes oral agent (monotherapy). Her weight is decreasing steadily. She is following a diabetic diet. When asked about meal planning, she reported none. She has not had a previous visit with a dietitian. She participates in exercise intermittently. An ACE inhibitor/angiotensin II receptor blocker is being taken. She does not see a podiatrist.Eye exam is not current.  Hypertension  This is a chronic problem. The current episode started more than 1 year ago. The problem is controlled. Pertinent negatives include no chest pain, headaches, palpitations or shortness of breath. Risk factors for coronary artery disease include diabetes mellitus, obesity, sedentary lifestyle, smoking/tobacco exposure and family history. Past treatments include ACE inhibitors.      Review of Systems  Constitutional: Positive for fatigue. Negative for chills, fever and unexpected weight change.  HENT: Negative for trouble swallowing and voice change.   Eyes: Negative for visual disturbance.  Respiratory:  Negative for cough, shortness of breath and wheezing.   Cardiovascular: Negative for chest pain, palpitations and leg swelling.  Gastrointestinal: Negative for diarrhea, nausea and vomiting.  Endocrine: Positive for polydipsia and polyuria. Negative for cold intolerance, heat intolerance and polyphagia.  Musculoskeletal: Negative for arthralgias and myalgias.  Skin: Negative for color change, pallor, rash and wound.  Neurological: Negative for seizures and headaches.  Psychiatric/Behavioral: Negative for confusion and suicidal ideas.    Objective:    BP 110/75   Pulse 92   Ht 5\' 8"  (1.727 m)   Wt 234 lb (106.1 kg)   BMI 35.58 kg/m   Wt Readings from Last 3 Encounters:  06/03/17 234 lb (106.1 kg)  04/10/17 234 lb 9.6 oz (106.4 kg)  02/28/17 241 lb (109.3 kg)     Physical Exam  Constitutional: She is oriented  to person, place, and time. She appears well-developed.  HENT:  Head: Normocephalic and atraumatic.  Eyes: EOM are normal.  Neck: Normal range of motion. Neck supple. No tracheal deviation present. No thyromegaly present.  Cardiovascular: Normal rate and regular rhythm.  Pulmonary/Chest: Effort normal and breath sounds normal.  Abdominal: Soft. Bowel sounds are normal. There is no tenderness. There is no guarding.  Musculoskeletal: Normal range of motion. She exhibits no edema.  Neurological: She is alert and oriented to person, place, and time. She has normal reflexes. No cranial nerve deficit. Coordination normal.  Skin: Skin is warm and dry. No rash noted. No erythema. No pallor.  Psychiatric: She has a normal mood and affect. Judgment normal.    Recent Results (from the past 2160 hour(s))  TSH     Status: None   Collection Time: 05/30/17  9:02 AM  Result Value Ref Range   TSH 0.89 mIU/L    Comment:           Reference Range .           > or = 20 Years  0.40-4.50 .                Pregnancy Ranges           First trimester    0.26-2.66           Second trimester    0.55-2.73           Third trimester    0.43-2.91   T4, free     Status: None   Collection Time: 05/30/17  9:02 AM  Result Value Ref Range   Free T4 1.1 0.8 - 1.8 ng/dL  Lipid panel     Status: Abnormal   Collection Time: 05/30/17  9:02 AM  Result Value Ref Range   Cholesterol 164 <200 mg/dL   HDL 42 (L) >50 mg/dL   Triglycerides 190 (H) <150 mg/dL   LDL Cholesterol (Calc) 94 mg/dL (calc)    Comment: Reference range: <100 . Desirable range <100 mg/dL for primary prevention;   <70 mg/dL for patients with CHD or diabetic patients  with > or = 2 CHD risk factors. Marland Kitchen LDL-C is now calculated using the Martin-Hopkins  calculation, which is a validated novel method providing  better accuracy than the Friedewald equation in the  estimation of LDL-C.  Cresenciano Genre et al. Annamaria Helling. 0932;355(73): 2061-2068  (http://education.[facility].com/faq/FAQ164)    Total CHOL/HDL Ratio 3.9 <5.0 (calc)   Non-HDL Cholesterol (Calc) 122 <130 mg/dL (calc)    Comment: For patients with diabetes plus 1 major ASCVD risk  factor, treating to a non-HDL-C goal of <100 mg/dL  (LDL-C of <70 mg/dL) is considered a therapeutic  option.   VITAMIN D 25 Hydroxy (Vit-D Deficiency, Fractures)     Status: None   Collection Time: 05/30/17  9:02 AM  Result Value Ref Range   Vit D, 25-Hydroxy 50 30 - 100 ng/mL    Comment: Vitamin D Status         25-OH Vitamin D: . Deficiency:                    <20 ng/mL Insufficiency:             20 - 29 ng/mL Optimal:                 > or = 30 ng/mL . For 25-OH Vitamin D testing on patients on  D2-supplementation and patients for whom  quantitation  of D2 and D3 fractions is required, the QuestAssureD(TM) 25-OH VIT D, (D2,D3), LC/MS/MS is recommended: order  code [phone] (patients >109yrs). . For more information on this test, go to: http://education.[facility].com/faq/FAQ163 (This link is being provided for  informational/educational purposes only.)   COMPLETE  METABOLIC PANEL WITH GFR     Status: Abnormal   Collection Time: 05/30/17  9:02 AM  Result Value Ref Range   Glucose, Bld 121 (H) 65 - 99 mg/dL    Comment: .            Fasting reference interval . For someone without known diabetes, a glucose value between 100 and 125 mg/dL is consistent with prediabetes and should be confirmed with a follow-up test. .    BUN 16 7 - 25 mg/dL   Creat 0.65 0.50 - 1.10 mg/dL   GFR, Est Non African American 110 > OR = 60 mL/min/1.37m2   GFR, Est African American 127 > OR = 60 mL/min/1.28m2   BUN/Creatinine Ratio NOT APPLICABLE 6 - 22 (calc)   Sodium 142 135 - 146 mmol/L   Potassium 4.4 3.5 - 5.3 mmol/L   Chloride 108 98 - 110 mmol/L   CO2 27 20 - 32 mmol/L   Calcium 9.5 8.6 - 10.2 mg/dL   Total Protein 6.3 6.1 - 8.1 g/dL   Albumin 4.1 3.6 - 5.1 g/dL   Globulin 2.2 1.9 - 3.7 g/dL (calc)   AG Ratio 1.9 1.0 - 2.5 (calc)   Total Bilirubin 0.7 0.2 - 1.2 mg/dL   Alkaline phosphatase (APISO) 64 33 - 115 U/L   AST 16 10 - 30 U/L   ALT 20 6 - 29 U/L  Hemoglobin A1c     Status: None   Collection Time: 05/30/17  9:02 AM  Result Value Ref Range   Hgb A1c MFr Bld 5.3 <5.7 % of total Hgb    Comment: For the purpose of screening for the presence of diabetes: . <5.7%       Consistent with the absence of diabetes 5.7-6.4%    Consistent with increased risk for diabetes             (prediabetes) > or =6.5%  Consistent with diabetes . This assay result is consistent with a decreased risk of diabetes. . Currently, no consensus exists regarding use of hemoglobin A1c for diagnosis of diabetes in children. . According to American Diabetes Association (ADA) guidelines, hemoglobin A1c <7.0% represents optimal control in non-pregnant diabetic patients. Different metrics may apply to specific patient populations.  Standards of Medical Care in Diabetes(ADA). .    Mean Plasma Glucose 105 (calc)   eAG (mmol/L) 5.8 (calc)     Assessment & Plan:   1.  Uncontrolled type 2 diabetes mellitus with hyperglycemia ([place])  - [name] has currently controlled asymptomatic type 2 DM since  42 years of age. -She came with significant improvement in her A1c to 5.3%, improving from 9.8%. - Recent labs reviewed.  - She made significant adjustment in her diet since diagnosis and saw significant improvement in her blood glucose profile in recent weeks. She does not report any gross competitions from her diabetes, however [name] remains at a high risk for more acute and chronic complications which include CAD, CVA, CKD, retinopathy, and neuropathy. These are all discussed in detail with the patient.  - I have counseled her on diet management and weight loss, by adopting a carbohydrate restricted/protein rich diet.  -  Suggestion is made for  her to avoid simple carbohydrates  from her diet including Cakes, Sweet Desserts / Pastries, Ice Cream, Soda (diet and regular), Sweet Tea, Candies, Chips, Cookies, Store Bought Juices, Alcohol in Excess of  1-2 drinks a day, Artificial Sweeteners, and "Sugar-free" Products. This will help patient to have stable blood glucose profile and potentially avoid unintended weight gain.   - I encouraged her to switch to  unprocessed or minimally processed complex starch and increased protein intake (animal or plant source), fruits, and vegetables.  - she is advised to stick to a routine mealtimes to eat 3 meals  a day and avoid unnecessary snacks ( to snack only to correct hypoglycemia).   - I have approached her with the following individualized plan to manage diabetes and patient agrees:   -He came with 25 pounds of weight loss and A1c at 5.3% improving from 9.8%.  This is mainly due to her engagement in lifestyle modification.  -She will not need additional treatment at this time. .  - I I advised her to continue metformin 500 mg p.o. twice daily . - She will have repeat labs in 3 months, she has multiple options  if she does not achieve control of diabetes target including incretin therapy as appropriate next visit. - Patient specific target  A1c;  LDL, HDL, Triglycerides, and  Waist Circumference were discussed in detail.  2) BP/HTN: Her blood pressure is controlled to target.  She is advised to continue her current medications including  ACEI/ARB. 3) Lipids/HPL: Her fasting lipid panel shows LDL of 94.     Patient is not on  Statins, will be considered for statins if her next fasting lipid panel shows LDL of greater than 70. 4)  Weight/Diet: CDE Consult has been  initiated , exercise, and detailed carbohydrates information provided.  5) Chronic Care/Health Maintenance:  -she  is on ACEI medications and  is encouraged to continue to follow up with Ophthalmology, Dentist,  Podiatrist at least yearly or according to recommendations, and advised to take away from smoking. I have recommended yearly flu vaccine and pneumonia vaccination at least every 5 years; moderate intensity exercise for up to 150 minutes weekly; and  sleep for at least 7 hours a day.  - I advised patient to maintain close follow up with Marylee Floras, FNP for primary care needs.  - Time spent with the patient: 25 min, of which >50% was spent in reviewing her  current and  previous labs, previous treatments, and medications doses and developing a plan for long-term care.  [name] participated in the discussions, expressed understanding, and voiced agreement with the above plans.  All questions were answered to her satisfaction. she is encouraged to contact clinic should she have any questions or concerns prior to her return visit.   Follow up plan: - Return in about 3 months (around 09/03/2017) for follow up with pre-visit labs.  Glade Lloyd, MD [facility] [address] [place], [place] [address] Phone: [phone]  Fax: [phone]    06/03/2017, 4:59 PM  This note  was partially dictated with voice recognition software. Similar sounding words can be transcribed inadequately or may not  be corrected upon review.

## 2017-06-03 NOTE — Patient Instructions (Signed)

## 2017-07-22 ENCOUNTER — Other Ambulatory Visit: Payer: Self-pay | Admitting: "Endocrinology

## 2017-08-13 ENCOUNTER — Other Ambulatory Visit: Payer: Self-pay | Admitting: Oncology

## 2017-08-31 LAB — COMPLETE METABOLIC PANEL WITH GFR
AG Ratio: 1.9 (calc) (ref 1.0–2.5)
ALKALINE PHOSPHATASE (APISO): 69 U/L (ref 33–115)
ALT: 20 U/L (ref 6–29)
AST: 16 U/L (ref 10–30)
Albumin: 4.2 g/dL (ref 3.6–5.1)
BUN: 15 mg/dL (ref 7–25)
CO2: 30 mmol/L (ref 20–32)
CREATININE: 0.72 mg/dL (ref 0.50–1.10)
Calcium: 9.6 mg/dL (ref 8.6–10.2)
Chloride: 104 mmol/L (ref 98–110)
GFR, Est African American: 120 mL/min/{1.73_m2} (ref >=60)
GFR, Est Non African American: 103 mL/min/{1.73_m2} (ref >=60)
GLUCOSE: 128 mg/dL — AB (ref 65–99)
Globulin: 2.2 g/dL (calc) (ref 1.9–3.7)
Potassium: 4.7 mmol/L (ref 3.5–5.3)
SODIUM: 140 mmol/L (ref 135–146)
Total Bilirubin: 0.8 mg/dL (ref 0.2–1.2)
Total Protein: 6.4 g/dL (ref 6.1–8.1)

## 2017-08-31 LAB — HEMOGLOBIN A1C
EAG (MMOL/L): 5.7 (calc)
HEMOGLOBIN A1C: 5.2 %{Hb} (ref <5.7)
MEAN PLASMA GLUCOSE: 103 (calc)

## 2017-09-04 ENCOUNTER — Ambulatory Visit: Payer: BLUE CROSS/BLUE SHIELD | Admitting: "Endocrinology

## 2017-09-04 ENCOUNTER — Encounter: Payer: BLUE CROSS/BLUE SHIELD | Attending: "Endocrinology | Admitting: Nutrition

## 2017-09-04 ENCOUNTER — Encounter: Payer: Self-pay | Admitting: Nutrition

## 2017-09-04 VITALS — BP 127/85 | HR 73 | Ht 68.0 in | Wt 234.0 lb

## 2017-09-04 VITALS — Ht 68.0 in | Wt 234.0 lb

## 2017-09-04 DIAGNOSIS — I10 Essential (primary) hypertension: Secondary | ICD-10-CM | POA: Diagnosis not present

## 2017-09-04 DIAGNOSIS — Z6835 Body mass index (BMI) 35.0-35.9, adult: Secondary | ICD-10-CM | POA: Diagnosis not present

## 2017-09-04 DIAGNOSIS — E6609 Other obesity due to excess calories: Secondary | ICD-10-CM

## 2017-09-04 DIAGNOSIS — E119 Type 2 diabetes mellitus without complications: Secondary | ICD-10-CM

## 2017-09-04 DIAGNOSIS — E669 Obesity, unspecified: Secondary | ICD-10-CM

## 2017-09-04 NOTE — Progress Notes (Signed)
Diabetes Self-Management Education  Visit Type:   F/u walk in  Appt. Start Time: 1500  Appt. End Time: 1630  09/04/2017  [name], identified by name and date of birth, is a 42 y.o. female with a diagnosis of Diabetes:  Marland Kitchen Here with her Mom today. Sees Dr. Dorris Fetch for Endocrinology. S/p Breast cancer. Type 2 DM for the last 2 years. On Metformin 500 mg BID. Has lost 7 lbs since last visit with Dr. Dorris Fetch and about 20+ lbs in the last 6 months. Motivated and engaged to make changes with diet and exercise for needed weight loss and improved blood sugars.  Lab Results  Component Value Date   HGBA1C 5.2 08/30/2017    ASSESSMENT  Height 5\' 8"  (1.727 m), weight 234 lb (106.1 kg). Body mass index is 35.58 kg/m.  Diabetes Self-Management Education - 04/10/17 1520      Visit Information   Visit Type  First/Initial      Initial Visit   Diabetes Type  Type 2    Are you currently following a meal plan?  No    Are you taking your medications as prescribed?  Yes    Date Diagnosed  2018      Health Coping   How would you rate your overall health?  Good      Psychosocial Assessment   Patient Belief/Attitude about Diabetes  Motivated to manage diabetes    Self-care barriers  None    Self-management support  Family    Other persons present  Patient    Patient Concerns  Nutrition/Meal planning;Medication;Monitoring;Healthy Lifestyle;Glycemic Control;Weight Control    Special Needs  None    Preferred Learning Style  No preference indicated    Learning Readiness  Change in progress    How often do you need to have someone help you when you read instructions, pamphlets, or other written materials from your doctor or pharmacy?  1 - Never    What is the last grade level you completed in school?  12      Pre-Education Assessment   Patient understands the diabetes disease and treatment process.  Needs Review    Patient understands incorporating nutritional management into lifestyle.   Needs Review    Patient undertands incorporating physical activity into lifestyle.  Needs Review    Patient understands using medications safely.  Needs Review    Patient understands monitoring blood glucose, interpreting and using results  Needs Review    Patient understands prevention, detection, and treatment of acute complications.  Needs Review    Patient understands prevention, detection, and treatment of chronic complications.  Needs Review    Patient understands how to develop strategies to address psychosocial issues.  Needs Review    Patient understands how to develop strategies to promote health/change behavior.  Needs Review      Complications   Last HgB A1C per patient/outside source  9.8 %    How often do you check your blood sugar?  1-2 times/day    Fasting Blood glucose range (mg/dL)  130-179    Number of hypoglycemic episodes per month  0    Number of hyperglycemic episodes per week  0    Have you had a dilated eye exam in the past 12 months?  Yes    Have you had a dental exam in the past 12 months?  Yes    Are you checking your feet?  Yes    How many days per week are you checking  your feet?  7      Dietary Intake   Breakfast  skips;  or coffee banana or 1-2 slices high fiber bread and egg.     Lunch  frozen stouffers lasagna and 2 slices ww toast, water    Dinner  2 pieces of chicken, coke zero    Beverage(s)  water- 64 oz per day.       Exercise   Exercise Type  Light (walking / raking leaves)    How many days per week to you exercise?  43    How many minutes per day do you exercise?  30    Total minutes per week of exercise  1290      Patient Education   Previous Diabetes Education  No    Disease state   Factors that contribute to the development of diabetes;Definition of diabetes, type 1 and 2, and the diagnosis of diabetes;Explored patient's options for treatment of their diabetes    Nutrition management   Role of diet in the treatment of diabetes and the  relationship between the three main macronutrients and blood glucose level;Food label reading, portion sizes and measuring food.;Carbohydrate counting;Reviewed blood glucose goals for pre and post meals and how to evaluate the patients' food intake on their blood glucose level.    Physical activity and exercise   Role of exercise on diabetes management, blood pressure control and cardiac health.;Identified with patient nutritional and/or medication changes necessary with exercise.;Helped patient identify appropriate exercises in relation to his/her diabetes, diabetes complications and other health issue.    Medications  Reviewed patients medication for diabetes, action, purpose, timing of dose and side effects.    Monitoring  Taught/evaluated SMBG meter.;Purpose and frequency of SMBG.    Acute complications  Taught treatment of hypoglycemia - the 15 rule.;Discussed and identified patients' treatment of hyperglycemia.;Covered sick day management with medication and food.    Chronic complications  Relationship between chronic complications and blood glucose control;Assessed and discussed foot care and prevention of foot problems;Identified and discussed with patient  current chronic complications;Lipid levels, blood glucose control and heart disease;Retinopathy and reason for yearly dilated eye exams;Dental care    Psychosocial adjustment  Worked with patient to identify barriers to care and solutions    Personal strategies to promote health  Lifestyle issues that need to be addressed for better diabetes care      Individualized Goals (developed by patient)   Nutrition  Follow meal plan discussed;General guidelines for healthy choices and portions discussed    Physical Activity  30 minutes per day;Exercise 5-7 days per week    Monitoring   test my blood glucose as discussed    Reducing Risk  examine blood glucose patterns;do foot checks daily;get labs drawn      Post-Education Assessment   Patient  understands the diabetes disease and treatment process.  Needs Review    Patient understands incorporating nutritional management into lifestyle.  Needs Review    Patient undertands incorporating physical activity into lifestyle.  Needs Review    Patient understands using medications safely.  Needs Review    Patient understands monitoring blood glucose, interpreting and using results  Needs Review    Patient understands prevention, detection, and treatment of acute complications.  Needs Review    Patient understands prevention, detection, and treatment of chronic complications.  Needs Review    Patient understands how to develop strategies to address psychosocial issues.  Needs Review    Patient understands how to develop strategies  to promote health/change behavior.  Needs Review      Outcomes   Expected Outcomes  Demonstrated interest in learning. Expect positive outcomes    Future DMSE  2 months    Program Status  Completed       Individualized Plan for Diabetes Self-Management Training:   Learning Objective:  Patient will have a greater understanding of diabetes self-management. Patient education plan is to attend individual and/or group sessions per assessed needs and concerns.   Plan:  Goals 1. Lose 1-2 lbs per week 2. Increase vegetables  With lunch and dinner 3. Plan meals better 4.Keep A1C less than 5.3%  Expected Outcomes:     Education material provided: Living Well with Diabetes, Food label handouts, A1C conversion sheet, Meal plan card, My Plate and Carbohydrate counting sheet  If problems or questions, patient to contact team via:  Phone and Email  Future DSME appointment:   3-6 months

## 2017-09-04 NOTE — Patient Instructions (Addendum)
Goals 1. Lose 1-2 lbs per week 2. Increase vegetables  With lunch and dinner 3. Plan meals better 4.Keep A1C less than 5.3%

## 2017-09-04 NOTE — Patient Instructions (Signed)

## 2017-09-04 NOTE — Progress Notes (Signed)
Consult Note       09/04/2017, 4:34 PM   Subjective:    Patient ID: [name], female    DOB: [birth year].  [name] is being seen in consultation for management of currently uncontrolled symptomatic diabetes requested by  Marylee Floras, FNP.   Past Medical History:  Diagnosis Date  . Anxiety   . Breast cancer ([place]) 10/16/12   left, lower medial, 8 o'clock  . DVT (deep venous thrombosis) ([place]) 02/2013   RT LEG   . Family history of anesthesia complication    pt's mother has hx. of post-op N/V  . History of radiation therapy 07/06/13-08/20/13   left breast/  . History of transfusion   . Hypertension    under control with med., has been on med. since 09/2012  . Pain in the wrist    BILATERAL   Past Surgical History:  Procedure Laterality Date  . BREAST BIOPSY Right   . BREAST LUMPECTOMY Left 2014  . BREAST LUMPECTOMY WITH NEEDLE LOCALIZATION AND AXILLARY SENTINEL LYMPH NODE BX Left 12/10/2012   Procedure: BREAST LUMPECTOMY WITH NEEDLE LOCALIZATION AND AXILLARY SENTINEL LYMPH NODE BX;  Surgeon: Merrie Roof, MD;  Location: [facility];  Service: General;  Laterality: Left;  Needle loc BCG 7:30  nuc med 9:30    . CESAREAN [place]  . CHOLECYSTECTOMY  1999  . LEEP N/A 01/26/2014   Procedure: LOOP ELECTROSURGICAL EXCISION PROCEDURE (LEEP);  Surgeon: Lucita Lora. Alycia Rossetti, MD;  Location: [facility];  Service: Gynecology;  Laterality: N/A;  . OPEN REDUCTION INTERNAL FIXATION (ORIF) TIBIA/FIBULA FRACTURE Right   . PORT-A-CATH REMOVAL    . PORTACATH PLACEMENT Right 01/09/2013   Procedure: INSERTION PORT-A-CATH;  Surgeon: Merrie Roof, MD;  Location: [facility];  Service: General;  Laterality: Right;  . ROBOTIC ASSISTED BILATERAL SALPINGO OOPHERECTOMY Bilateral 01/26/2014   Procedure: ROBOTIC ASSISTED BILATERAL SALPINGO OOPHORECTOMY;  Surgeon: Imagene Gurney A. Alycia Rossetti, MD;  Location: [facility];   Service: Gynecology;  Laterality: Bilateral;  . TONSILLECTOMY AND ADENOIDECTOMY     as a teenager   Social History   Socioeconomic History  . Marital status: Divorced    Spouse name: Not on file  . Number of children: Not on file  . Years of education: Not on file  . Highest education level: Not on file  Occupational History  . Not on file  Social Needs  . Financial resource strain: Not on file  . Food insecurity:    Worry: Not on file    Inability: Not on file  . Transportation needs:    Medical: Not on file    Non-medical: Not on file  Tobacco Use  . Smoking status: Former Smoker    Last attempt to quit: 12/03/2012    Years since quitting: 4.7  . Smokeless tobacco: Never Used  Substance and Sexual Activity  . Alcohol use: Yes    Comment: occasionally  . Drug use: No  . Sexual activity: Yes    Birth control/protection: Condom    Comment: menarche age 70, P26 @ age 65,  last menstrual cycle 09/29/12  Lifestyle  .  Physical activity:    Days per week: Not on file    Minutes per session: Not on file  . Stress: Not on file  Relationships  . Social connections:    Talks on phone: Not on file    Gets together: Not on file    Attends religious service: Not on file    Active member of club or organization: Not on file    Attends meetings of clubs or organizations: Not on file    Relationship status: Not on file  Other Topics Concern  . Not on file  Social History Narrative  . Not on file   Outpatient Encounter Medications as of 09/04/2017  Medication Sig  . b complex vitamins tablet Take 1 tablet by mouth every morning.  Marland Kitchen BIOTIN PO Take 1 tablet by mouth every morning.  . Cholecalciferol 2000 units CAPS Take 1 capsule by mouth daily.  Marland Kitchen letrozole (FEMARA) 2.5 MG tablet Take 2.5 mg by mouth daily.  Marland Kitchen lisinopril (PRINIVIL,ZESTRIL) 10 MG tablet TAKE 1 TABLET BY MOUTH EVERY DAY IN THE MORNING  . LORazepam (ATIVAN) 0.5 MG tablet Take 1 tablet (0.5 mg total) by mouth at  bedtime as needed (Nausea or vomiting). (Patient not taking: Reported on 09/04/2017)  . venlafaxine XR (EFFEXOR-XR) 37.5 MG 24 hr capsule Take 1 capsule (37.5 mg total) by mouth daily with breakfast.  . [DISCONTINUED] metFORMIN (GLUCOPHAGE) 500 MG tablet TAKE 1 TABLET (500 MG TOTAL) BY MOUTH 2 (TWO) TIMES DAILY WITH A MEAL.   No facility-administered encounter medications on file as of 09/04/2017.     ALLERGIES: Allergies  Allergen Reactions  . Amoxicillin Other (See Comments)    States "it does not work"    VACCINATION STATUS:  There is no immunization history on file for this patient.  Diabetes  She presents for her follow-up diabetic visit. She has type 2 diabetes mellitus. Onset time: She was diagnosed at approximate age of 42 years. This is following exposure to high-dose steroids related to treatment for breast cancer. Her disease course has been improving. There are no hypoglycemic associated symptoms. Pertinent negatives for hypoglycemia include no confusion, headaches, pallor or seizures. Pertinent negatives for diabetes include no chest pain, no fatigue, no polydipsia, no polyphagia and no polyuria. There are no hypoglycemic complications. Symptoms are improving. There are no diabetic complications. Risk factors for coronary artery disease include diabetes mellitus, hypertension, obesity, tobacco exposure and sedentary lifestyle. Current diabetic treatment includes oral agent (monotherapy). Her weight is stable. She is following a diabetic diet. When asked about meal planning, she reported none. She has not had a previous visit with a dietitian. She participates in exercise intermittently. An ACE inhibitor/angiotensin II receptor blocker is being taken. She does not see a podiatrist.Eye exam is not current.  Hypertension  This is a chronic problem. The current episode started more than 1 year ago. The problem is controlled. Pertinent negatives include no chest pain, headaches,  palpitations or shortness of breath. Risk factors for coronary artery disease include diabetes mellitus, obesity, sedentary lifestyle, smoking/tobacco exposure and family history. Past treatments include ACE inhibitors.      Review of Systems  Constitutional: Negative for chills, fatigue, fever and unexpected weight change.  HENT: Negative for trouble swallowing and voice change.   Eyes: Negative for visual disturbance.  Respiratory: Negative for cough, shortness of breath and wheezing.   Cardiovascular: Negative for chest pain, palpitations and leg swelling.  Gastrointestinal: Negative for diarrhea, nausea and vomiting.  Endocrine: Negative for  cold intolerance, heat intolerance, polydipsia, polyphagia and polyuria.  Musculoskeletal: Negative for arthralgias and myalgias.  Skin: Negative for color change, pallor, rash and wound.  Neurological: Negative for seizures and headaches.  Psychiatric/Behavioral: Negative for confusion and suicidal ideas.    Objective:    BP 127/85   Pulse 73   Ht 5\' 8"  (1.727 m)   Wt 234 lb (106.1 kg)   BMI 35.58 kg/m   Wt Readings from Last 3 Encounters:  09/04/17 234 lb (106.1 kg)  09/04/17 234 lb (106.1 kg)  06/03/17 234 lb (106.1 kg)     Physical Exam  Constitutional: She is oriented to person, place, and time. She appears well-developed.  HENT:  Head: Normocephalic and atraumatic.  Eyes: EOM are normal.  Neck: Normal range of motion. Neck supple. No tracheal deviation present. No thyromegaly present.  Cardiovascular: Normal rate.  Pulmonary/Chest: Effort normal.  Abdominal: There is no tenderness. There is no guarding.  Musculoskeletal: Normal range of motion. She exhibits no edema.  Neurological: She is alert and oriented to person, place, and time. No cranial nerve deficit. Coordination normal.  Skin: Skin is warm and dry. No rash noted. No erythema. No pallor.  Psychiatric: She has a normal mood and affect. Judgment normal.    Recent  Results (from the past 2160 hour(s))  COMPLETE METABOLIC PANEL WITH GFR     Status: Abnormal   Collection Time: 08/30/17  8:27 AM  Result Value Ref Range   Glucose, Bld 128 (H) 65 - 99 mg/dL    Comment: .            Fasting reference interval . For someone without known diabetes, a glucose value >125 mg/dL indicates that they may have diabetes and this should be confirmed with a follow-up test. .    BUN 15 7 - 25 mg/dL   Creat 0.72 0.50 - 1.10 mg/dL   GFR, Est Non African American 103 > OR = 60 mL/min/1.65m2   GFR, Est African American 120 > OR = 60 mL/min/1.69m2   BUN/Creatinine Ratio NOT APPLICABLE 6 - 22 (calc)   Sodium 140 135 - 146 mmol/L   Potassium 4.7 3.5 - 5.3 mmol/L   Chloride 104 98 - 110 mmol/L   CO2 30 20 - 32 mmol/L   Calcium 9.6 8.6 - 10.2 mg/dL   Total Protein 6.4 6.1 - 8.1 g/dL   Albumin 4.2 3.6 - 5.1 g/dL   Globulin 2.2 1.9 - 3.7 g/dL (calc)   AG Ratio 1.9 1.0 - 2.5 (calc)   Total Bilirubin 0.8 0.2 - 1.2 mg/dL   Alkaline phosphatase (APISO) 69 33 - 115 U/L   AST 16 10 - 30 U/L   ALT 20 6 - 29 U/L  Hemoglobin A1c     Status: None   Collection Time: 08/30/17  8:27 AM  Result Value Ref Range   Hgb A1c MFr Bld 5.2 <5.7 % of total Hgb    Comment: For the purpose of screening for the presence of diabetes: . <5.7%       Consistent with the absence of diabetes 5.7-6.4%    Consistent with increased risk for diabetes             (prediabetes) > or =6.5%  Consistent with diabetes . This assay result is consistent with a decreased risk of diabetes. . Currently, no consensus exists regarding use of hemoglobin A1c for diagnosis of diabetes in children. . According to American Diabetes Association (ADA) guidelines, hemoglobin A1c <7.0%  represents optimal control in non-pregnant diabetic patients. Different metrics may apply to specific patient populations.  Standards of Medical Care in Diabetes(ADA). .    Mean Plasma Glucose 103 (calc)   eAG (mmol/L) 5.7  (calc)   Lipid Panel     Component Value Date/Time   CHOL 164 05/30/2017 0902   TRIG 190 (H) 05/30/2017 0902   HDL 42 (L) 05/30/2017 0902   CHOLHDL 3.9 05/30/2017 0902   LDLCALC 94 05/30/2017 0902     Assessment & Plan:   1. Uncontrolled type 2 diabetes mellitus with hyperglycemia ([place])  - Aizza Santiago has currently controlled asymptomatic type 2 DM since  42 years of age. -She came with significant improvement in her A1c to 5.2%, improving from 9.8%. - Recent labs reviewed.  - She made significant adjustment in her diet since diagnosis and saw significant improvement in her blood glucose profile in recent weeks. She does not report any gross competitions from her diabetes, however Willie Loy remains at a high risk for more acute and chronic complications which include CAD, CVA, CKD, retinopathy, and neuropathy. These are all discussed in detail with the patient.  - I have counseled her on diet management and weight loss, by adopting a carbohydrate restricted/protein rich diet.  -  Suggestion is made for her to avoid simple carbohydrates  from her diet including Cakes, Sweet Desserts / Pastries, Ice Cream, Soda (diet and regular), Sweet Tea, Candies, Chips, Cookies, Store Bought Juices, Alcohol in Excess of  1-2 drinks a day, Artificial Sweeteners, and "Sugar-free" Products. This will help patient to have stable blood glucose profile and potentially avoid unintended weight gain.  - I encouraged her to switch to  unprocessed or minimally processed complex starch and increased protein intake (animal or plant source), fruits, and vegetables.  - she is advised to stick to a routine mealtimes to eat 3 meals  a day and avoid unnecessary snacks ( to snack only to correct hypoglycemia).   - I have approached her with the following individualized plan to manage diabetes and patient agrees:   -She came with continued improvement in her glycemic profile and A1c of 5.2%, generally  improving from 9.8%.    -   This is mainly due to her engagement in lifestyle modification.   -This time, she would be given a chance to come off of treatment and advised her to stay off of metformin until next visit in 6 months with repeat labs.   -She has several choices for treatment if she loses control of diabetes by next visit. - Patient specific target  A1c;  LDL, HDL, Triglycerides, and  Waist Circumference were discussed in detail.  2) BP/HTN: Blood pressure is controlled to target.  She is advised to continue her current blood pressure medications including lisinopril 10 mg p.o. daily.     3) Lipids/HPL: Her fasting lipid panel shows LDL of 94.     Patient is not on  Statins, will be considered for statins if her next fasting lipid panel shows LDL of greater than 70. 4)  Weight/Diet: CDE Consult has been  initiated , exercise, and detailed carbohydrates information provided.  5) Chronic Care/Health Maintenance:  -she  is on ACEI medications and  is encouraged to continue to follow up with Ophthalmology, Dentist,  Podiatrist at least yearly or according to recommendations, and advised to take away from smoking. I have recommended yearly flu vaccine and pneumonia vaccination at least every 5 years; moderate intensity exercise for up  to 150 minutes weekly; and  sleep for at least 7 hours a day.  - I advised patient to maintain close follow up with Marylee Floras, FNP for primary care needs.   Follow up plan: - Return in about 6 months (around 03/06/2018) for follow up with pre-visit labs.  Glade Lloyd, MD [facility] [address] [place], [place] [address] Phone: [phone]  Fax: [phone]    09/04/2017, 4:34 PM  This note was partially dictated with voice recognition software. Similar sounding words can be transcribed inadequately or may not  be corrected upon review.

## 2017-09-09 ENCOUNTER — Encounter: Payer: Self-pay | Admitting: Nutrition

## 2017-09-12 ENCOUNTER — Encounter: Payer: Self-pay | Admitting: "Endocrinology

## 2017-09-12 LAB — HM DIABETES EYE EXAM

## 2017-10-22 ENCOUNTER — Other Ambulatory Visit: Payer: Self-pay | Admitting: "Endocrinology

## 2017-10-28 ENCOUNTER — Other Ambulatory Visit: Payer: Self-pay | Admitting: Oncology

## 2017-10-28 DIAGNOSIS — Z853 Personal history of malignant neoplasm of breast: Secondary | ICD-10-CM

## 2017-11-12 ENCOUNTER — Other Ambulatory Visit: Payer: Self-pay | Admitting: Oncology

## 2017-11-17 HISTORY — PX: BREAST BIOPSY: SHX20

## 2017-12-13 ENCOUNTER — Other Ambulatory Visit: Payer: Self-pay | Admitting: Oncology

## 2017-12-13 ENCOUNTER — Other Ambulatory Visit: Payer: Self-pay

## 2017-12-13 DIAGNOSIS — Z853 Personal history of malignant neoplasm of breast: Secondary | ICD-10-CM

## 2017-12-17 ENCOUNTER — Ambulatory Visit
Admission: RE | Admit: 2017-12-17 | Discharge: 2017-12-17 | Disposition: A | Discharge status: Home / Self Care | Payer: BLUE CROSS/BLUE SHIELD | Source: Ambulatory Visit | Attending: Oncology | Admitting: Oncology

## 2017-12-17 DIAGNOSIS — R87810 Cervical high risk human papillomavirus (HPV) DNA test positive: Secondary | ICD-10-CM

## 2017-12-17 DIAGNOSIS — Z853 Personal history of malignant neoplasm of breast: Secondary | ICD-10-CM

## 2017-12-17 DIAGNOSIS — C50312 Malignant neoplasm of lower-inner quadrant of left female breast: Secondary | ICD-10-CM

## 2017-12-17 DIAGNOSIS — M8580 Other specified disorders of bone density and structure, unspecified site: Secondary | ICD-10-CM

## 2017-12-17 HISTORY — DX: Personal history of antineoplastic chemotherapy: Z92.21

## 2017-12-17 HISTORY — DX: Personal history of irradiation: Z92.3

## 2018-01-12 NOTE — Progress Notes (Signed)
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 An ultrasound on 10/06/2012 showed a 1.6 x 1.3 x 1.3 cm mass at the 8 o'clock position 7 cm from the nipple. At the 12 o'clock position behind and nipple was a circumscribed oval shaped 2 x 1.8 x 1.2 cm lesion perhaps representing a fibroadenoma. 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 The carcinoma was grade 2-3, estrogen receptor 80% positive, progesterone receptor 80% positive, Ki-67 25%, and HER-2/neu negative. Bilateral breast MRI on 10/22/2012 showed biopsy proven carcinoma in the left lower inner quadrant identified and measures approximately 1.9 x 1.9 x 1.8 cm. Well-defined nodule in the right upper outer quadrant posteriorly consistent with a fibroadenoma. Probable liver cysts.There were multiple scattered nonspecific foci in each breast. No other suspicious abnormality was identified. No abnormal appearing lymph nodes.   The patient's subsequent history is as detailed below  INTERVAL HISTORY:  [name] returns today for follow-up and treatment of her estrogen receptor positive breast cancer. She is accompanied by her mother today. She continues on letrozole, with good tolerance. She has hot flashes that are improving. She is taking effexor and she would  like to come off this medication at this time.   Since her last visit to the office, she underwent a diagnostic bilateral mammogram on 12/17/2017 with results showing: Breast density category C. No mammographic evidence for malignancy.   She also had a bone density scan completed on 12/17/2017 with results showing: T-score of -2.5 at AP spine L1-L4.  She is now in the osteoporotic range                                                                            REVIEW OF SYSTEMS:  [name] reports that for exercise, she walks 20 minutes 2-3 times a week. She works a sedentary job 5 days a week. She recently went to the beach with her family, which she enjoyed. She notes that her family is doing well and her daughter is in her final year studying landscape architecture. She denies unusual headaches, visual changes, nausea, vomiting, or dizziness. There has been no unusual cough, phlegm production, or pleurisy. This been no change in bowel or bladder habits. She denies unexplained fatigue or unexplained weight loss, bleeding, rash, or fever. A detailed review of systems was otherwise stable.     PAST MEDICAL HISTORY: Past Medical History:  Diagnosis Date  . Anxiety   . Breast cancer ([place]) 10/16/12   left, lower medial, 8 o'clock  . DVT (deep venous thrombosis) ([place]) 02/2013   RT LEG   . Family history of anesthesia complication  pt's mother has hx. of post-op N/V  . History of radiation therapy 07/06/13-08/20/13   left breast/  . History of transfusion   . Hypertension    under control with med., has been on med. since 09/2012  . Pain in the wrist    BILATERAL  . Personal history of chemotherapy 2014  . Personal history of radiation therapy 2014    PAST SURGICAL HISTORY: Past Surgical History:  Procedure Laterality Date  . BREAST BIOPSY Right 11/2017   benign  . BREAST LUMPECTOMY Left 2014  . BREAST LUMPECTOMY WITH NEEDLE LOCALIZATION AND AXILLARY SENTINEL LYMPH NODE BX Left 12/10/2012    Procedure: BREAST LUMPECTOMY WITH NEEDLE LOCALIZATION AND AXILLARY SENTINEL LYMPH NODE BX;  Surgeon: Theresa Roof, MD;  Location: [facility];  Service: General;  Laterality: Left;  Needle loc BCG 7:30  nuc med 9:30    . CESAREAN [place]  . CHOLECYSTECTOMY  1999  . LEEP N/A 01/26/2014   Procedure: LOOP ELECTROSURGICAL EXCISION PROCEDURE (LEEP);  Surgeon: Theresa Lora. Alycia Rossetti, MD;  Location: [facility];  Service: Gynecology;  Laterality: N/A;  . OPEN REDUCTION INTERNAL FIXATION (ORIF) TIBIA/FIBULA FRACTURE Right   . PORT-A-CATH REMOVAL    . PORTACATH PLACEMENT Right 01/09/2013   Procedure: INSERTION PORT-A-CATH;  Surgeon: Theresa Roof, MD;  Location: [facility];  Service: General;  Laterality: Right;  . ROBOTIC ASSISTED BILATERAL SALPINGO OOPHERECTOMY Bilateral 01/26/2014   Procedure: ROBOTIC ASSISTED BILATERAL SALPINGO OOPHORECTOMY;  Surgeon: Theresa Gurney A. Alycia Rossetti, MD;  Location: [facility];  Service: Gynecology;  Laterality: Bilateral;  . TONSILLECTOMY AND ADENOIDECTOMY     as a teenager    FAMILY HISTORY Family History  Problem Relation Age of Onset  . Anesthesia problems Mother        post-op N/V  . Breast cancer Maternal Grandmother   . 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 She is divorced. Her daughter, [name] is now in her third year at [employer] with a minor in art.     ADVANCED DIRECTIVES: (Updated 01/12/2014) Not in place. At her 10/20/2013 visit the patient was given  the appropriate documents to complete and notarize at her discretion. She tells me she intends to name her mother, [name], as healthcare power of attorney. She can be reached at [phone]   HEALTH MAINTENANCE: Social History   Tobacco Use  . Smoking status: Former Smoker    Last attempt to quit: 12/03/2012    Years since quitting: 5.1  . Smokeless tobacco: Never Used  Substance Use Topics  . Alcohol use: Yes    Comment: occasionally  . Drug use: No    Colonoscopy: n/a Pap Smear: 03/2013 Eye Exam: 08/2012 Vitamin D Level: pending Lipid Panel: not  Checked recently   Allergies  Allergen Reactions  . Amoxicillin Other (See Comments)    States "it does not work"    Current Outpatient Medications  Medication Sig Dispense Refill  . b complex vitamins tablet Take 1 tablet by mouth every  morning.    Marland Kitchen BIOTIN PO Take 1 tablet by mouth every morning.    . Cholecalciferol 2000 units CAPS Take 1 capsule by mouth daily.    Marland Kitchen letrozole (FEMARA) 2.5 MG tablet Take 2.5 mg by mouth daily.    Marland Kitchen lisinopril (PRINIVIL,ZESTRIL) 10 MG tablet TAKE 1 TABLET BY MOUTH EVERY DAY IN THE MORNING 90 tablet 0  . LORazepam (ATIVAN) 0.5 MG tablet Take 1 tablet (0.5 mg total) by mouth at bedtime as needed (Nausea or vomiting). (Patient not taking: Reported on 09/04/2017) 30 tablet 0  . venlafaxine XR (EFFEXOR-XR) 37.5 MG 24 hr capsule Take 1 capsule (37.5 mg total) by mouth daily with breakfast. 90 capsule 5   No current facility-administered medications for this visit.     OBJECTIVE: Young white woman who appears well  Vitals:   01/13/18 1010  BP: (!) 130/97  Pulse: 72  Resp: 18  Temp: 98.4 F (36.9 C)  SpO2: 100%     Body mass index is 36.49 kg/m.       ECOG FS:0 - Asymptomatic   Sclerae unicteric, EOMs intact Oropharynx clear and moist No cervical or supraclavicular adenopathy Lungs no rales or rhonchi Heart regular rate and rhythm Abd soft, nontender, positive bowel sounds MSK  no focal spinal tenderness, no upper extremity lymphedema Neuro: nonfocal, well oriented, appropriate affect Breasts: The right breast is unremarkable.  The left breast has undergone lumpectomy followed by radiation.  There is no evidence of disease recurrence.  Both axillae are benign.  LAB RESULTS:  CMP     Component Value Date/Time   NA 140 08/30/2017 0827   NA 135 (L) 01/07/2017 1212   K 4.7 08/30/2017 0827   K 4.1 01/07/2017 1212   CL 104 08/30/2017 0827   CO2 30 08/30/2017 0827   CO2 26 01/07/2017 1212   GLUCOSE 128 (H) 08/30/2017 0827   GLUCOSE 339 (H) 01/07/2017 1212   BUN 15 08/30/2017 0827   BUN 15.0 01/07/2017 1212   CREATININE 0.72 08/30/2017 0827   CREATININE 0.9 01/07/2017 1212   CALCIUM 9.6 08/30/2017 0827   CALCIUM 9.4 01/07/2017 1212   PROT 6.4 08/30/2017 0827   PROT 6.9 01/07/2017 1212   ALBUMIN 3.8 01/07/2017 1212   AST 16 08/30/2017 0827   AST 54 (H) 01/07/2017 1212   ALT 20 08/30/2017 0827   ALT 67 (H) 01/07/2017 1212   ALKPHOS 99 01/07/2017 1212   BILITOT 0.8 08/30/2017 0827   BILITOT 1.01 01/07/2017 1212   GFRNONAA 103 08/30/2017 0827   GFRAA 120 08/30/2017 0827    I No results found for: SPEP  Lab Results  Component Value Date   WBC 7.5 01/13/2018   NEUTROABS 4.3 01/13/2018   HGB 13.5 01/13/2018   HCT 40.1 01/13/2018   MCV 93.0 01/13/2018   PLT 242 01/13/2018      Chemistry      Component Value Date/Time   NA 140 08/30/2017 0827   NA 135 (L) 01/07/2017 1212   K 4.7 08/30/2017 0827   K 4.1 01/07/2017 1212   CL 104 08/30/2017 0827   CO2 30 08/30/2017 0827   CO2 26 01/07/2017 1212   BUN 15 08/30/2017 0827   BUN 15.0 01/07/2017 1212   CREATININE 0.72 08/30/2017 0827   CREATININE 0.9 01/07/2017 1212      Component Value Date/Time   CALCIUM 9.6 08/30/2017 0827   CALCIUM 9.4 01/07/2017 1212   ALKPHOS 99 01/07/2017 1212   AST 16 08/30/2017 0827  AST 54 (H) 01/07/2017 1212   ALT 20 08/30/2017 0827   ALT 67 (H) 01/07/2017 1212    BILITOT 0.8 08/30/2017 0827   BILITOT 1.01 01/07/2017 1212       No results found for: LABCA2  No components found for: [id number]  No results for input(s): INR in the last 168 hours.  Urinalysis    Component Value Date/Time   COLORURINE YELLOW 01/21/2014 1503   APPEARANCEUR CLEAR 01/21/2014 1503   LABSPEC 1.014 01/21/2014 1503   LABSPEC 1.010 07/16/2013 1538   PHURINE 6.5 01/21/2014 1503   GLUCOSEU NEGATIVE 01/21/2014 1503   GLUCOSEU Negative 07/16/2013 1538   HGBUR NEGATIVE 01/21/2014 1503   BILIRUBINUR NEGATIVE 01/21/2014 1503   BILIRUBINUR Negative 07/16/2013 1538   KETONESUR NEGATIVE 01/21/2014 1503   PROTEINUR NEGATIVE 01/21/2014 1503   UROBILINOGEN 0.2 01/21/2014 1503   UROBILINOGEN 0.2 07/16/2013 1538   NITRITE NEGATIVE 01/21/2014 1503   LEUKOCYTESUR NEGATIVE 01/21/2014 1503   LEUKOCYTESUR Negative 07/16/2013 1538    STUDIES: Dg Bone Density  Result Date: 12/17/2017 EXAM: DUAL X-RAY ABSORPTIOMETRY (DXA) FOR BONE MINERAL DENSITY IMPRESSION: Referring Physician:  Chauncey Cruel Your patient completed a BMD test using Lunar IDXA DXA system ( analysis version: 16 ) manufactured by [employer]. Technologist: Renford Dills PATIENT: Name: [name], [name] Patient ID: [id number] Birth Date: 1975-05-11 Height: 66.2 in. Sex: Female Measured: 12/17/2017 Weight: 236.7 lbs. Indications: Bilateral Ovariectomy (65.51), Breast Cancer History, Caucasian, Effexor, Estrogen Deficient, Family History of Osteoporosis, Height Loss (781.91), History of Osteopenia, Letrozole, Low Calcium Intake (269.3), Postmenopausal, Secondary Osteoporosis Fractures: None Treatments: Vitamin D (E933.5) ASSESSMENT: The BMD measured at AP Spine L1-L4 is 0.888 g/cm2 with a T-score of -2.5. This patient is considered osteoporotic according to [facility]) criteria. There has been a statistically significant decrease in BMD of Total mean hips since prior exam dated 08/22/2015. The scan quality is limited  by patient body habitus. Site Region Measured Date Measured Age YA BMD Significant CHANGE T-score AP Spine L1-L4 12/17/2017 42.6 -2.5 0.888 g/cm2 * AP Spine L1-L4 08/22/2015 40.3 -1.7 0.981 g/cm2 * DualFemur Neck Right 12/17/2017 42.6 -1.8 0.785 g/cm2 * DualFemur Neck Right 08/22/2015    40.3         -1.3    0.853 g/cm2 DualFemur Total Mean 12/17/2017 42.6 -0.8 0.912 g/cm2 * DualFemur Total Mean 08/22/2015    40.3         -0.4    0.956 g/cm2 World Health Organization [facility]) criteria for post-menopausal, Caucasian Women: Normal       T-score at or above -1 SD Osteopenia   T-score between -1 and -2.5 SD Osteoporosis T-score at or below -2.5 SD RECOMMENDATION: 1. All patients should optimize calcium and vitamin D intake. 2. Consider FDA approved medical therapies in postmenopausal women and men aged 62 years and older, based on the following: a. A hip or vertebral (clinical or morphometric) fracture b. T- score < or = -2.5 at the femoral neck or spine after appropriate evaluation to exclude secondary causes c. Low bone mass (T-score between -1.0 and -2.5 at the femoral neck or spine) and a 10 year probability of a hip fracture > or = 3% or a 10 year probability of a major osteoporosis-related fracture > or = 20% based on the [place]-adapted WHO algorithm d. Clinician judgment and/or patient preferences may indicate treatment for people with 10-year fracture probabilities above or below these levels FOLLOW-UP: People with diagnosed cases of osteoporosis or at high risk for fracture should  have regular bone mineral density tests. For patients eligible for Medicare, routine testing is allowed once every 2 years. The testing frequency can be increased to one year for patients who have rapidly progressing disease, those who are receiving or discontinuing medical therapy to restore bone mass, or have additional risk factors. I have reviewed this report and agree with the above findings. [facility] Electronically Signed    By: Lajean Manes M.D.   On: 12/17/2017 13:53   Mm Diag Breast Tomo Bilateral  Result Date: 12/17/2017 CLINICAL DATA:  Patient with history of left breast lumpectomy 2014. EXAM: DIGITAL DIAGNOSTIC BILATERAL MAMMOGRAM WITH CAD AND TOMO COMPARISON:  Previous exam(s). ACR Breast Density Category c: The breast tissue is heterogeneously dense, which may obscure small masses. FINDINGS: No concerning masses, calcifications or distortion identified within either breast. Stable left breast lumpectomy changes. Mammographic images were processed with CAD. IMPRESSION: No mammographic evidence for malignancy. RECOMMENDATION: Screening mammogram in one year.(Code:SM-B-01Y) I have discussed the findings and recommendations with the patient. Results were also provided in writing at the conclusion of the visit. If applicable, a reminder letter will be sent to the patient regarding the next appointment. BI-RADS CATEGORY  2: Benign. Electronically Signed   By: Lovey Newcomer M.D.   On: 12/17/2017 14:19    ASSESSMENT: 42 y.o. BRCA negative [place], [place] woman  status post left breast lower inner quadrant lumpectomy with left axillary sentinel lymph node biopsy 12/10/2012 for a pT2, pN45m, stage IIB invasive ductal carcinoma, grade 3, estrogen receptor 80% positive, progesterone receptor 80% positive, Ki-67 25%, HER-2/neu negative  [place]2 [place].   #2 s/p adjuvant chemotherapy consisting of FEC (5-FU/epirubicin/Cytoxan) begun on 01/23/2013,completed on 04/03/13 (six cycles).   #3 S/p adjuvant weekly Taxol beginning 04/17/13, received  7 of the 12 planned cycles, last dose 06/05/13 (stopped secondary to skin toxicity)  #6 adjuvant radiation to the left breast and regional lymph nodes completed 08/20/2013  #7 goserelin started 04/17/2013, [place]  (a) s/p BSO 01/26/2014 with benign pathology  #8 anastrozole started  08/14/2013; bone density 08/19/2013 was normal  (a) stopped 04/15/14 because of severe joint stiffness. Switched to letrozole starting 05/03/14  (b) DEXA scan 08/22/2015 showed a T score of -1.7  (c) given history of DVT we are not considering switching to tamoxifen.  (d) bone density 12/17/2017 shows a T score of -2.5--osteoporosis  (e) denosumab/ Prolia to start 01/21/2018  #9 Right lower extremity DVT diagnosed on 03/16/2013, patient was placed on Xarelto. Repeat venous Doppler performed in March 2015 was negative for any thrombosis.  Xarelto was stopped Aug 14, 2012.    PLAN:  SJudiis now 5 years out from definitive surgery for her breast cancer with no evidence of disease recurrence.  This is very favorable.  She is tolerating letrozole well.  The plan is to continue that for a minimum of 5 years, but she is interested in doing the maximum, which would be 7 years.  Her hot flashes are now mild enough that she would like to get off the venlafaxine.  I wrote out a taper for her.  If she has any difficulty with that she will let me know.  The big problem is her bone density.  She is now in the osteoporotic range.  She is on vitamin D supplementation but does not have a good walking program and I suggest that she get a stand-up  desk and or consider walking 45 minutes a day for at least 5 days a week.  That of course also would be helpful in terms of her sugar control.  I do think she needs to consider pharmacologic agents and we spent approximately 30 minutes today discussing the difference between bisphosphonates and denosumab as well as the possible side effects toxicities and complications of the various agents.  After much discussion she would like to give denosumab/Prolia a try.  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  We will repeat a bone density 2  years from now  She knows to call for any other issues that may develop before the next visit     Cox, Theresa Dad, MD  01/13/18 10:32 AM Medical Oncology and Hematology [facility] [place], [place] [address] Tel. [phone]    Fax. [phone]    I, Soijett Blue am acting as scribe for Dr. Sarajane Jews C. Cox.  I, Lurline Del MD, have reviewed the above documentation for accuracy and completeness, and I agree with the above.

## 2018-01-13 ENCOUNTER — Inpatient Hospital Stay: Payer: BLUE CROSS/BLUE SHIELD

## 2018-01-13 ENCOUNTER — Inpatient Hospital Stay: Payer: BLUE CROSS/BLUE SHIELD | Attending: Oncology | Admitting: Oncology

## 2018-01-13 ENCOUNTER — Telehealth: Payer: Self-pay | Admitting: Oncology

## 2018-01-13 VITALS — BP 130/97 | HR 72 | Temp 98.4°F | Resp 18 | Ht 68.0 in | Wt 240.0 lb

## 2018-01-13 DIAGNOSIS — Z87891 Personal history of nicotine dependence: Secondary | ICD-10-CM | POA: Insufficient documentation

## 2018-01-13 DIAGNOSIS — Z86718 Personal history of other venous thrombosis and embolism: Secondary | ICD-10-CM | POA: Diagnosis not present

## 2018-01-13 DIAGNOSIS — Z79811 Long term (current) use of aromatase inhibitors: Secondary | ICD-10-CM | POA: Diagnosis not present

## 2018-01-13 DIAGNOSIS — C50312 Malignant neoplasm of lower-inner quadrant of left female breast: Secondary | ICD-10-CM | POA: Diagnosis present

## 2018-01-13 DIAGNOSIS — Z803 Family history of malignant neoplasm of breast: Secondary | ICD-10-CM | POA: Diagnosis not present

## 2018-01-13 DIAGNOSIS — Z801 Family history of malignant neoplasm of trachea, bronchus and lung: Secondary | ICD-10-CM | POA: Diagnosis not present

## 2018-01-13 DIAGNOSIS — R232 Flushing: Secondary | ICD-10-CM | POA: Diagnosis not present

## 2018-01-13 DIAGNOSIS — Z170 Estrogen receptor positive status [ER+]: Secondary | ICD-10-CM | POA: Insufficient documentation

## 2018-01-13 DIAGNOSIS — I10 Essential (primary) hypertension: Secondary | ICD-10-CM | POA: Insufficient documentation

## 2018-01-13 DIAGNOSIS — M810 Age-related osteoporosis without current pathological fracture: Secondary | ICD-10-CM | POA: Diagnosis not present

## 2018-01-13 DIAGNOSIS — M818 Other osteoporosis without current pathological fracture: Secondary | ICD-10-CM

## 2018-01-13 DIAGNOSIS — Z923 Personal history of irradiation: Secondary | ICD-10-CM | POA: Insufficient documentation

## 2018-01-13 DIAGNOSIS — Z7901 Long term (current) use of anticoagulants: Secondary | ICD-10-CM

## 2018-01-13 DIAGNOSIS — Z9221 Personal history of antineoplastic chemotherapy: Secondary | ICD-10-CM | POA: Insufficient documentation

## 2018-01-13 DIAGNOSIS — Z79899 Other long term (current) drug therapy: Secondary | ICD-10-CM | POA: Diagnosis not present

## 2018-01-13 DIAGNOSIS — E1165 Type 2 diabetes mellitus with hyperglycemia: Secondary | ICD-10-CM

## 2018-01-13 LAB — CBC WITH DIFFERENTIAL/PLATELET
ABS IMMATURE GRANULOCYTES: 0.02 10*3/uL (ref 0.00–0.07)
BASOS PCT: 0 %
Basophils Absolute: 0 10*3/uL (ref 0.0–0.1)
Eosinophils Absolute: 0.1 10*3/uL (ref 0.0–0.5)
Eosinophils Relative: 2 %
HEMATOCRIT: 40.1 % (ref 36.0–46.0)
HEMOGLOBIN: 13.5 g/dL (ref 12.0–15.0)
Immature Granulocytes: 0 %
LYMPHS ABS: 2.5 10*3/uL (ref 0.7–4.0)
LYMPHS PCT: 34 %
MCH: 31.3 pg (ref 26.0–34.0)
MCHC: 33.7 g/dL (ref 30.0–36.0)
MCV: 93 fL (ref 80.0–100.0)
MONO ABS: 0.4 10*3/uL (ref 0.1–1.0)
Monocytes Relative: 6 %
NEUTROS ABS: 4.3 10*3/uL (ref 1.7–7.7)
Neutrophils Relative %: 58 %
Platelets: 242 10*3/uL (ref 150–400)
RBC: 4.31 MIL/uL (ref 3.87–5.11)
RDW: 12.8 % (ref 11.5–15.5)
WBC: 7.5 10*3/uL (ref 4.0–10.5)
nRBC: 0 % (ref 0.0–0.2)

## 2018-01-13 LAB — COMPREHENSIVE METABOLIC PANEL WITH GFR
ALT: 23 U/L (ref 0–44)
AST: 19 U/L (ref 15–41)
Albumin: 3.8 g/dL (ref 3.5–5.0)
Alkaline Phosphatase: 89 U/L (ref 38–126)
Anion gap: 7 (ref 5–15)
BUN: 13 mg/dL (ref 6–20)
CO2: 28 mmol/L (ref 22–32)
Calcium: 9.7 mg/dL (ref 8.9–10.3)
Chloride: 104 mmol/L (ref 98–111)
Creatinine, Ser: 0.79 mg/dL (ref 0.44–1.00)
GFR calc Af Amer: >60 mL/min
GFR calc non Af Amer: >60 mL/min
Glucose, Bld: 198 mg/dL — ABNORMAL HIGH (ref 70–99)
Potassium: 4.2 mmol/L (ref 3.5–5.1)
Sodium: 139 mmol/L (ref 135–145)
Total Bilirubin: 0.6 mg/dL (ref 0.3–1.2)
Total Protein: 6.7 g/dL (ref 6.5–8.1)

## 2018-01-13 NOTE — Telephone Encounter (Signed)
Gave patient avs.  Not able to print calendar due to computer issues.  Patient is good with appts and is my chart active.

## 2018-01-21 ENCOUNTER — Inpatient Hospital Stay: Payer: BLUE CROSS/BLUE SHIELD | Attending: Oncology

## 2018-01-21 ENCOUNTER — Inpatient Hospital Stay: Payer: BLUE CROSS/BLUE SHIELD

## 2018-01-21 VITALS — BP 132/98 | HR 79 | Temp 98.3°F | Resp 18

## 2018-01-21 DIAGNOSIS — M810 Age-related osteoporosis without current pathological fracture: Secondary | ICD-10-CM | POA: Diagnosis not present

## 2018-01-21 DIAGNOSIS — Z170 Estrogen receptor positive status [ER+]: Secondary | ICD-10-CM | POA: Diagnosis not present

## 2018-01-21 DIAGNOSIS — C50312 Malignant neoplasm of lower-inner quadrant of left female breast: Secondary | ICD-10-CM

## 2018-01-21 DIAGNOSIS — Z923 Personal history of irradiation: Secondary | ICD-10-CM | POA: Diagnosis not present

## 2018-01-21 DIAGNOSIS — Z79811 Long term (current) use of aromatase inhibitors: Secondary | ICD-10-CM | POA: Diagnosis not present

## 2018-01-21 DIAGNOSIS — C773 Secondary and unspecified malignant neoplasm of axilla and upper limb lymph nodes: Secondary | ICD-10-CM | POA: Diagnosis not present

## 2018-01-21 DIAGNOSIS — Z9221 Personal history of antineoplastic chemotherapy: Secondary | ICD-10-CM | POA: Insufficient documentation

## 2018-01-21 LAB — CBC WITH DIFFERENTIAL/PLATELET
ABS IMMATURE GRANULOCYTES: 0.02 10*3/uL (ref 0.00–0.07)
BASOS ABS: 0.1 10*3/uL (ref 0.0–0.1)
Basophils Relative: 1 %
Eosinophils Absolute: 0.2 10*3/uL (ref 0.0–0.5)
Eosinophils Relative: 2 %
HCT: 42.8 % (ref 36.0–46.0)
HEMOGLOBIN: 14.3 g/dL (ref 12.0–15.0)
Immature Granulocytes: 0 %
LYMPHS PCT: 31 %
Lymphs Abs: 2.6 10*3/uL (ref 0.7–4.0)
MCH: 31.3 pg (ref 26.0–34.0)
MCHC: 33.4 g/dL (ref 30.0–36.0)
MCV: 93.7 fL (ref 80.0–100.0)
Monocytes Absolute: 0.5 10*3/uL (ref 0.1–1.0)
Monocytes Relative: 6 %
NEUTROS ABS: 5.1 10*3/uL (ref 1.7–7.7)
NEUTROS PCT: 60 %
NRBC: 0 % (ref 0.0–0.2)
Platelets: 240 10*3/uL (ref 150–400)
RBC: 4.57 MIL/uL (ref 3.87–5.11)
RDW: 13 % (ref 11.5–15.5)
WBC: 8.4 10*3/uL (ref 4.0–10.5)

## 2018-01-21 LAB — COMPREHENSIVE METABOLIC PANEL
ALBUMIN: 3.8 g/dL (ref 3.5–5.0)
ALK PHOS: 99 U/L (ref 38–126)
ALT: 25 U/L (ref 0–44)
ANION GAP: 9 (ref 5–15)
AST: 19 U/L (ref 15–41)
BUN: 13 mg/dL (ref 6–20)
CALCIUM: 9.8 mg/dL (ref 8.9–10.3)
CO2: 28 mmol/L (ref 22–32)
Chloride: 101 mmol/L (ref 98–111)
Creatinine, Ser: 0.89 mg/dL (ref 0.44–1.00)
GFR calc Af Amer: >60 mL/min (ref >60)
GFR calc non Af Amer: >60 mL/min (ref >60)
GLUCOSE: 277 mg/dL — AB (ref 70–99)
Potassium: 4.3 mmol/L (ref 3.5–5.1)
SODIUM: 138 mmol/L (ref 135–145)
Total Bilirubin: 0.8 mg/dL (ref 0.3–1.2)
Total Protein: 7 g/dL (ref 6.5–8.1)

## 2018-01-21 MED ORDER — DENOSUMAB 60 MG/ML ~~LOC~~ SOSY
60.0000 mg | PREFILLED_SYRINGE | Freq: Once | SUBCUTANEOUS | Status: AC
  Administered 2018-01-21: 60 mg via SUBCUTANEOUS

## 2018-01-21 MED ORDER — DENOSUMAB 60 MG/ML ~~LOC~~ SOSY: 60.0000 mg | PREFILLED_SYRINGE | Freq: Once | SUBCUTANEOUS | 0 refills | Status: AC

## 2018-01-21 MED ORDER — DENOSUMAB 60 MG/ML ~~LOC~~ SOSY
PREFILLED_SYRINGE | SUBCUTANEOUS | Status: AC
  Filled 2018-01-21: qty 1

## 2018-01-21 NOTE — Patient Instructions (Signed)

## 2018-02-08 ENCOUNTER — Other Ambulatory Visit: Payer: Self-pay | Admitting: Oncology

## 2018-02-11 ENCOUNTER — Other Ambulatory Visit: Payer: Self-pay | Admitting: Oncology

## 2018-03-06 ENCOUNTER — Ambulatory Visit: Payer: BLUE CROSS/BLUE SHIELD | Admitting: "Endocrinology

## 2018-03-22 LAB — COMPLETE METABOLIC PANEL WITH GFR
AG Ratio: 2 (calc) (ref 1.0–2.5)
ALBUMIN MSPROF: 4.3 g/dL (ref 3.6–5.1)
ALT: 29 U/L (ref 6–29)
AST: 22 U/L (ref 10–30)
Alkaline phosphatase (APISO): 63 U/L (ref 33–115)
BUN: 18 mg/dL (ref 7–25)
CALCIUM: 9.5 mg/dL (ref 8.6–10.2)
CO2: 28 mmol/L (ref 20–32)
Chloride: 102 mmol/L (ref 98–110)
Creat: 0.68 mg/dL (ref 0.50–1.10)
GFR, EST NON AFRICAN AMERICAN: 108 mL/min/{1.73_m2} (ref >=60)
GFR, Est African American: 125 mL/min/{1.73_m2} (ref >=60)
GLOBULIN: 2.1 g/dL (ref 1.9–3.7)
GLUCOSE: 184 mg/dL — AB (ref 65–99)
Potassium: 4.6 mmol/L (ref 3.5–5.3)
SODIUM: 138 mmol/L (ref 135–146)
Total Bilirubin: 0.6 mg/dL (ref 0.2–1.2)
Total Protein: 6.4 g/dL (ref 6.1–8.1)

## 2018-03-22 LAB — HEMOGLOBIN A1C
EAG (MMOL/L): 8.9 (calc)
HEMOGLOBIN A1C: 7.2 %{Hb} — AB (ref <5.7)
Mean Plasma Glucose: 160 (calc)

## 2018-03-26 ENCOUNTER — Encounter: Payer: Self-pay | Admitting: "Endocrinology

## 2018-03-26 ENCOUNTER — Ambulatory Visit: Payer: BLUE CROSS/BLUE SHIELD | Admitting: "Endocrinology

## 2018-03-26 VITALS — BP 121/84 | HR 84 | Ht 68.0 in | Wt 248.0 lb

## 2018-03-26 DIAGNOSIS — I10 Essential (primary) hypertension: Secondary | ICD-10-CM

## 2018-03-26 DIAGNOSIS — E119 Type 2 diabetes mellitus without complications: Secondary | ICD-10-CM

## 2018-03-26 MED ORDER — METFORMIN HCL 500 MG PO TABS: 500.0000 mg | ORAL_TABLET | Freq: Two times a day (BID) | ORAL | 6 refills | Status: DC

## 2018-03-26 NOTE — Progress Notes (Signed)
Endocrinology follow-up note       03/26/2018, 3:47 PM   Subjective:    Patient ID: [name], female    DOB: 04-17-1975.  [name] is being seen in consultation for management of currently uncontrolled symptomatic type 2 diabetes, hypertension. PCP: Marylee Floras, FNP.   Past Medical History:  Diagnosis Date  . Anxiety   . Breast cancer ([place]) 10/16/12   left, lower medial, 8 o'clock  . DVT (deep venous thrombosis) ([place]) 02/2013   RT LEG   . Family history of anesthesia complication    pt's mother has hx. of post-op N/V  . History of radiation therapy 07/06/13-08/20/13   left breast/  . History of transfusion   . Hypertension    under control with med., has been on med. since 09/2012  . Pain in the wrist    BILATERAL  . Personal history of chemotherapy 2014  . Personal history of radiation therapy 2014   Past Surgical History:  Procedure Laterality Date  . BREAST BIOPSY Right 11/2017   benign  . BREAST LUMPECTOMY Left 2014  . BREAST LUMPECTOMY WITH NEEDLE LOCALIZATION AND AXILLARY SENTINEL LYMPH NODE BX Left 12/10/2012   Procedure: BREAST LUMPECTOMY WITH NEEDLE LOCALIZATION AND AXILLARY SENTINEL LYMPH NODE BX;  Surgeon: Merrie Roof, MD;  Location: [facility];  Service: General;  Laterality: Left;  Needle loc BCG 7:30  nuc med 9:30    . CESAREAN [place]  . CHOLECYSTECTOMY  1999  . LEEP N/A 01/26/2014   Procedure: LOOP ELECTROSURGICAL EXCISION PROCEDURE (LEEP);  Surgeon: Lucita Lora. Alycia Rossetti, MD;  Location: [facility];  Service: Gynecology;  Laterality: N/A;  . OPEN REDUCTION INTERNAL FIXATION (ORIF) TIBIA/FIBULA FRACTURE Right   . PORT-A-CATH REMOVAL    . PORTACATH PLACEMENT Right 01/09/2013   Procedure: INSERTION PORT-A-CATH;  Surgeon: Merrie Roof, MD;  Location: [facility];  Service: General;  Laterality: Right;  . ROBOTIC ASSISTED BILATERAL SALPINGO OOPHERECTOMY  Bilateral 01/26/2014   Procedure: ROBOTIC ASSISTED BILATERAL SALPINGO OOPHORECTOMY;  Surgeon: Imagene Gurney A. Alycia Rossetti, MD;  Location: [facility];  Service: Gynecology;  Laterality: Bilateral;  . TONSILLECTOMY AND ADENOIDECTOMY     as a teenager   Social History   Socioeconomic History  . Marital status: Divorced    Spouse name: Not on file  . Number of children: Not on file  . Years of education: Not on file  . Highest education level: Not on file  Occupational History  . Not on file  Social Needs  . Financial resource strain: Not on file  . Food insecurity:    Worry: Not on file    Inability: Not on file  . Transportation needs:    Medical: Not on file    Non-medical: Not on file  Tobacco Use  . Smoking status: Former Smoker    Last attempt to quit: 12/03/2012    Years since quitting: 5.3  . Smokeless tobacco: Never Used  Substance and Sexual Activity  . Alcohol use: Yes    Comment: occasionally  . Drug use: No  . Sexual activity: Yes    Birth control/protection:  Condom    Comment: menarche age 52, P1 @ age 18,  last menstrual cycle 09/29/12  Lifestyle  . Physical activity:    Days per week: Not on file    Minutes per session: Not on file  . Stress: Not on file  Relationships  . Social connections:    Talks on phone: Not on file    Gets together: Not on file    Attends religious service: Not on file    Active member of club or organization: Not on file    Attends meetings of clubs or organizations: Not on file    Relationship status: Not on file  Other Topics Concern  . Not on file  Social History Narrative  . Not on file   Outpatient Encounter Medications as of 03/26/2018  Medication Sig  . b complex vitamins tablet Take 1 tablet by mouth every morning.  Marland Kitchen BIOTIN PO Take 1 tablet by mouth every morning.  . Cholecalciferol 2000 units CAPS Take 1 capsule by mouth daily.  Marland Kitchen letrozole (FEMARA) 2.5 MG tablet Take 2.5 mg by mouth daily.  Marland Kitchen lisinopril (PRINIVIL,ZESTRIL) 10 MG  tablet TAKE 1 TABLET BY MOUTH EVERY DAY IN THE MORNING  . LORazepam (ATIVAN) 0.5 MG tablet Take 1 tablet (0.5 mg total) by mouth at bedtime as needed (Nausea or vomiting). (Patient not taking: Reported on 09/04/2017)  . metFORMIN (GLUCOPHAGE) 500 MG tablet Take 1 tablet (500 mg total) by mouth 2 (two) times daily with a meal.  . [DISCONTINUED] letrozole (FEMARA) 2.5 MG tablet TAKE 1 TABLET BY MOUTH EVERY DAY  . [DISCONTINUED] venlafaxine XR (EFFEXOR-XR) 37.5 MG 24 hr capsule Take 1 capsule (37.5 mg total) by mouth daily with breakfast.   No facility-administered encounter medications on file as of 03/26/2018.     ALLERGIES: Allergies  Allergen Reactions  . Amoxicillin Other (See Comments)    States "it does not work"    VACCINATION STATUS:  There is no immunization history on file for this patient.  Diabetes  She presents for her follow-up diabetic visit. She has type 2 diabetes mellitus. Onset time: She was diagnosed at approximate age of 43 years. This is following exposure to high-dose steroids related to treatment for breast cancer. Her disease course has been worsening. There are no hypoglycemic associated symptoms. Pertinent negatives for hypoglycemia include no confusion, headaches, pallor or seizures. Pertinent negatives for diabetes include no chest pain, no fatigue, no polydipsia, no polyphagia and no polyuria. There are no hypoglycemic complications. Symptoms are worsening. There are no diabetic complications. Risk factors for coronary artery disease include diabetes mellitus, hypertension, obesity, tobacco exposure and sedentary lifestyle. Current diabetic treatment includes oral agent (monotherapy). Her weight is fluctuating minimally. She is following a diabetic diet. When asked about meal planning, she reported none. She has had a previous visit with a dietitian. She participates in exercise intermittently. An ACE inhibitor/angiotensin II receptor blocker is being taken. She does not  see a podiatrist.Eye exam is not current.  Hypertension  This is a chronic problem. The current episode started more than 1 year ago. The problem is controlled. Pertinent negatives include no chest pain, headaches, palpitations or shortness of breath. Risk factors for coronary artery disease include diabetes mellitus, obesity, sedentary lifestyle, smoking/tobacco exposure and family history. Past treatments include ACE inhibitors.    Review of Systems  Constitutional: Negative for chills, fatigue, fever and unexpected weight change.  HENT: Negative for trouble swallowing and voice change.   Eyes: Negative for visual  disturbance.  Respiratory: Negative for cough, shortness of breath and wheezing.   Cardiovascular: Negative for chest pain, palpitations and leg swelling.  Gastrointestinal: Negative for diarrhea, nausea and vomiting.  Endocrine: Negative for cold intolerance, heat intolerance, polydipsia, polyphagia and polyuria.  Musculoskeletal: Negative for arthralgias and myalgias.  Skin: Negative for color change, pallor, rash and wound.  Neurological: Negative for seizures and headaches.  Psychiatric/Behavioral: Negative for confusion and suicidal ideas.    Objective:    BP 121/84   Pulse 84   Ht _0  (1.727 m)   Wt 248 lb (112.5 kg)   BMI 37.71 kg/m   Wt Readings from Last 3 Encounters:  03/26/18 248 lb (112.5 kg)  01/13/18 240 lb (108.9 kg)  09/04/17 234 lb (106.1 kg)     Physical Exam  Constitutional: She is oriented to person, place, and time. She appears well-developed.  HENT:  Head: Normocephalic and atraumatic.  Eyes: EOM are normal.  Neck: Normal range of motion. Neck supple. No tracheal deviation present. No thyromegaly present.  Cardiovascular: Normal rate.  Pulmonary/Chest: Effort normal.  Abdominal: There is no abdominal tenderness. There is no guarding.  Musculoskeletal: Normal range of motion.        General: No edema.  Neurological: She is alert and  oriented to person, place, and time. No cranial nerve deficit. Coordination normal.  Skin: Skin is warm and dry. No rash noted. No erythema. No pallor.  Psychiatric: She has a normal mood and affect. Judgment normal.    Recent Results (from the past 2160 hour(s))  CBC with Differential     Status: None   Collection Time: 01/13/18  9:57 AM  Result Value Ref Range   WBC 7.5 4.0 - 10.5 K/uL   RBC 4.31 3.87 - 5.11 MIL/uL   Hemoglobin 13.5 12.0 - 15.0 g/dL   HCT 40.1 36.0 - 46.0 %   MCV 93.0 80.0 - 100.0 fL   MCH 31.3 26.0 - 34.0 pg   MCHC 33.7 30.0 - 36.0 g/dL   RDW 12.8 11.5 - 15.5 %   Platelets 242 150 - 400 K/uL   nRBC 0.0 0.0 - 0.2 %   Neutrophils Relative % 58 %   Neutro Abs 4.3 1.7 - 7.7 K/uL   Lymphocytes Relative 34 %   Lymphs Abs 2.5 0.7 - 4.0 K/uL   Monocytes Relative 6 %   Monocytes Absolute 0.4 0.1 - 1.0 K/uL   Eosinophils Relative 2 %   Eosinophils Absolute 0.1 0.0 - 0.5 K/uL   Basophils Relative 0 %   Basophils Absolute 0.0 0.0 - 0.1 K/uL   Immature Granulocytes 0 %   Abs Immature Granulocytes 0.02 0.00 - 0.07 K/uL    Comment: Performed at [facility], [place] [address], [place], [place] [address]  Comprehensive metabolic panel     Status: Abnormal   Collection Time: 01/13/18  9:57 AM  Result Value Ref Range   Sodium 139 135 - 145 mmol/L   Potassium 4.2 3.5 - 5.1 mmol/L   Chloride 104 98 - 111 mmol/L   CO2 28 22 - 32 mmol/L   Glucose, Bld 198 (H) 70 - 99 mg/dL   BUN 13 6 - 20 mg/dL   Creatinine, Ser 0.79 0.44 - 1.00 mg/dL   Calcium 9.7 8.9 - 10.3 mg/dL   Total Protein 6.7 6.5 - 8.1 g/dL   Albumin 3.8 3.5 - 5.0 g/dL   AST 19 15 - 41 U/L   ALT 23 0 -  44 U/L   Alkaline Phosphatase 89 38 - 126 U/L   Total Bilirubin 0.6 0.3 - 1.2 mg/dL   GFR calc non Af Amer >60 >60 mL/min   GFR calc Af Amer >60 >60 mL/min    Comment: (NOTE) The eGFR has been calculated using the CKD EPI equation. This calculation has not been validated in all clinical  situations. eGFR's persistently <60 mL/min signify possible Chronic Kidney Disease.    Anion gap 7 5 - 15    Comment: Performed at [facility], [address], [place], [place] [address]  CBC with Differential     Status: None   Collection Time: 01/21/18  9:51 AM  Result Value Ref Range   WBC 8.4 4.0 - 10.5 K/uL   RBC 4.57 3.87 - 5.11 MIL/uL   Hemoglobin 14.3 12.0 - 15.0 g/dL   HCT 42.8 36.0 - 46.0 %   MCV 93.7 80.0 - 100.0 fL   MCH 31.3 26.0 - 34.0 pg   MCHC 33.4 30.0 - 36.0 g/dL   RDW 13.0 11.5 - 15.5 %   Platelets 240 150 - 400 K/uL   nRBC 0.0 0.0 - 0.2 %   Neutrophils Relative % 60 %   Neutro Abs 5.1 1.7 - 7.7 K/uL   Lymphocytes Relative 31 %   Lymphs Abs 2.6 0.7 - 4.0 K/uL   Monocytes Relative 6 %   Monocytes Absolute 0.5 0.1 - 1.0 K/uL   Eosinophils Relative 2 %   Eosinophils Absolute 0.2 0.0 - 0.5 K/uL   Basophils Relative 1 %   Basophils Absolute 0.1 0.0 - 0.1 K/uL   Immature Granulocytes 0 %   Abs Immature Granulocytes 0.02 0.00 - 0.07 K/uL    Comment: Performed at [facility], [place] [address] [place], [place] [address]  Comprehensive metabolic panel     Status: Abnormal   Collection Time: 01/21/18  9:51 AM  Result Value Ref Range   Sodium 138 135 - 145 mmol/L   Potassium 4.3 3.5 - 5.1 mmol/L   Chloride 101 98 - 111 mmol/L   CO2 28 22 - 32 mmol/L   Glucose, Bld 277 (H) 70 - 99 mg/dL   BUN 13 6 - 20 mg/dL   Creatinine, Ser 0.89 0.44 - 1.00 mg/dL   Calcium 9.8 8.9 - 10.3 mg/dL   Total Protein 7.0 6.5 - 8.1 g/dL   Albumin 3.8 3.5 - 5.0 g/dL   AST 19 15 - 41 U/L   ALT 25 0 - 44 U/L   Alkaline Phosphatase 99 38 - 126 U/L   Total Bilirubin 0.8 0.3 - 1.2 mg/dL   GFR calc non Af Amer >60 >60 mL/min   GFR calc Af Amer >60 >60 mL/min    Comment: (NOTE) The eGFR has been calculated using the CKD EPI equation. This calculation has not been validated in all clinical situations. eGFR's persistently <60 mL/min signify  possible Chronic Kidney Disease.    Anion gap 9 5 - 15    Comment: Performed at [facility], [address] [place], [place] [address]  COMPLETE METABOLIC PANEL WITH GFR     Status: Abnormal   Collection Time: 03/21/18  9:06 AM  Result Value Ref Range   Glucose, Bld 184 (H) 65 - 99 mg/dL    Comment: .            Fasting reference interval . For someone without known diabetes, a glucose value >125  mg/dL indicates that they may have diabetes and this should be confirmed with a follow-up test. .    BUN 18 7 - 25 mg/dL   Creat 0.68 0.50 - 1.10 mg/dL   GFR, Est Non African American 108 > OR = 60 mL/min/1.68m   GFR, Est African American 125 > OR = 60 mL/min/1.757m  BUN/Creatinine Ratio NOT APPLICABLE 6 - 22 (calc)   Sodium 138 135 - 146 mmol/L   Potassium 4.6 3.5 - 5.3 mmol/L   Chloride 102 98 - 110 mmol/L   CO2 28 20 - 32 mmol/L   Calcium 9.5 8.6 - 10.2 mg/dL   Total Protein 6.4 6.1 - 8.1 g/dL   Albumin 4.3 3.6 - 5.1 g/dL   Globulin 2.1 1.9 - 3.7 g/dL (calc)   AG Ratio 2.0 1.0 - 2.5 (calc)   Total Bilirubin 0.6 0.2 - 1.2 mg/dL   Alkaline phosphatase (APISO) 63 33 - 115 U/L   AST 22 10 - 30 U/L   ALT 29 6 - 29 U/L  Hemoglobin A1c     Status: Abnormal   Collection Time: 03/21/18  9:06 AM  Result Value Ref Range   Hgb A1c MFr Bld 7.2 (H) <5.7 % of total Hgb    Comment: For someone without known diabetes, a hemoglobin A1c value of 6.5% or greater indicates that they may have  diabetes and this should be confirmed with a follow-up  test. . For someone with known diabetes, a value <7% indicates  that their diabetes is well controlled and a value  greater than or equal to 7% indicates suboptimal  control. A1c targets should be individualized based on  duration of diabetes, age, comorbid conditions, and  other considerations. . Currently, no consensus exists regarding use of hemoglobin A1c for diagnosis of diabetes for children. .    Mean Plasma  Glucose 160 (calc)   eAG (mmol/L) 8.9 (calc)   Lipid Panel     Component Value Date/Time   CHOL 164 05/30/2017 0902   TRIG 190 (H) 05/30/2017 0902   HDL 42 (L) 05/30/2017 0902   CHOLHDL 3.9 05/30/2017 0902   LDLCALC 94 05/30/2017 0902     Assessment & Plan:   1. Uncontrolled type 2 diabetes mellitus with hyperglycemia ([place] currently controlled asymptomatic type 2 DM since  [age]ears of age. -She came with increased A1c of 7.2%.    - Recent labs reviewed.  - She does not report any gross competitions from her diabetes, however ShGraysen Depaulaemains at a high risk for more acute and chronic complications which include CAD, CVA, CKD, retinopathy, and neuropathy. These are all discussed in detail with the patient.  - I have counseled her on diet management and weight loss, by adopting a carbohydrate restricted/protein rich diet.  -  Suggestion is made for her to avoid simple carbohydrates  from her diet including Cakes, Sweet Desserts / Pastries, Ice Cream, Soda (diet and regular), Sweet Tea, Candies, Chips, Cookies, Store Bought Juices, Alcohol in Excess of  1-2 drinks a day, Artificial Sweeteners, and "Sugar-free" Products. This will help patient to have stable blood glucose profile and potentially avoid unintended weight gain.   - I encouraged her to switch to  unprocessed or minimally processed complex starch and increased protein intake (animal or plant source), fruits, and vegetables.  - she is advised to stick to a routine mealtimes to eat 3 meals  a day and avoid unnecessary snacks ( to  snack only to correct hypoglycemia).   - I have approached her with the following individualized plan to manage diabetes and patient agrees:   -Based on her presentation with A1c of 7.2% increasing from 5.2%, she is considered for retreatment with metformin which she has responded to before.   -I discussed and initiated metformin 500 mg p.o. twice daily after breakfast  and supper.   -She has several choices for treatment if she continues to lose control of diabetes by next visit. - Patient specific target  A1c;  LDL, HDL, Triglycerides, and  Waist Circumference were discussed in detail.  2) BP/HTN: Her blood pressure is controlled to target .  She is advised to continue her current blood pressure medications including lisinopril 10 mg p.o. daily.     3) Lipids/HPL: Her fasting lipid panel shows LDL of 94.   She is not on statins, will be considered for statins if her next fasting lipid panel shows LDL of greater than 70. 4)  Weight/Diet: CDE Consult has been  initiated , exercise, and detailed carbohydrates information provided.  5) Chronic Care/Health Maintenance:  -she  is on ACEI medications and  is encouraged to continue to follow up with Ophthalmology, Dentist,  Podiatrist at least yearly or according to recommendations, and advised to take away from smoking. I have recommended yearly flu vaccine and pneumonia vaccination at least every 5 years; moderate intensity exercise for up to 150 minutes weekly; and  sleep for at least 7 hours a day.  - I advised patient to maintain close follow up with Marylee Floras, FNP for primary care needs.  - Time spent with the patient: 25 min, of which >50% was spent in reviewing her  current and  previous labs, previous treatments, and medications doses and developing a plan for long-term care.  [name] participated in the discussions, expressed understanding, and voiced agreement with the above plans.  All questions were answered to her satisfaction. she is encouraged to contact clinic should she have any questions or concerns prior to her return visit.  Follow up plan: - Return in about 6 months (around 09/24/2018) for Follow up with Pre-visit Labs.  Glade Lloyd, MD [facility] [address] [place], [place] [address] Phone: [phone]  Fax:  [phone]    03/26/2018, 3:47 PM  This note was partially dictated with voice recognition software. Similar sounding words can be transcribed inadequately or may not  be corrected upon review.

## 2018-03-26 NOTE — Patient Instructions (Signed)

## 2018-05-14 ENCOUNTER — Other Ambulatory Visit: Payer: Self-pay | Admitting: Oncology

## 2018-07-18 ENCOUNTER — Inpatient Hospital Stay: Payer: BLUE CROSS/BLUE SHIELD

## 2018-07-18 ENCOUNTER — Other Ambulatory Visit: Payer: Self-pay

## 2018-07-18 ENCOUNTER — Inpatient Hospital Stay: Payer: BLUE CROSS/BLUE SHIELD | Attending: Oncology

## 2018-07-18 VITALS — BP 131/91 | HR 81 | Temp 98.6°F | Resp 18

## 2018-07-18 DIAGNOSIS — C50312 Malignant neoplasm of lower-inner quadrant of left female breast: Secondary | ICD-10-CM

## 2018-07-18 DIAGNOSIS — Z79811 Long term (current) use of aromatase inhibitors: Secondary | ICD-10-CM | POA: Insufficient documentation

## 2018-07-18 DIAGNOSIS — Z170 Estrogen receptor positive status [ER+]: Secondary | ICD-10-CM | POA: Insufficient documentation

## 2018-07-18 DIAGNOSIS — Z9221 Personal history of antineoplastic chemotherapy: Secondary | ICD-10-CM | POA: Insufficient documentation

## 2018-07-18 DIAGNOSIS — Z923 Personal history of irradiation: Secondary | ICD-10-CM | POA: Diagnosis not present

## 2018-07-18 LAB — CBC WITH DIFFERENTIAL/PLATELET
Abs Immature Granulocytes: 0.04 10*3/uL (ref 0.00–0.07)
Basophils Absolute: 0 10*3/uL (ref 0.0–0.1)
Basophils Relative: 0 %
Eosinophils Absolute: 0.2 10*3/uL (ref 0.0–0.5)
Eosinophils Relative: 2 %
HCT: 41 % (ref 36.0–46.0)
Hemoglobin: 13.8 g/dL (ref 12.0–15.0)
Immature Granulocytes: 0 %
Lymphocytes Relative: 37 %
Lymphs Abs: 3.9 10*3/uL (ref 0.7–4.0)
MCH: 31.3 pg (ref 26.0–34.0)
MCHC: 33.7 g/dL (ref 30.0–36.0)
MCV: 93 fL (ref 80.0–100.0)
Monocytes Absolute: 0.6 10*3/uL (ref 0.1–1.0)
Monocytes Relative: 6 %
Neutro Abs: 5.7 10*3/uL (ref 1.7–7.7)
Neutrophils Relative %: 55 %
Platelets: 248 10*3/uL (ref 150–400)
RBC: 4.41 MIL/uL (ref 3.87–5.11)
RDW: 13.2 % (ref 11.5–15.5)
WBC: 10.5 10*3/uL (ref 4.0–10.5)
nRBC: 0 % (ref 0.0–0.2)

## 2018-07-18 LAB — COMPREHENSIVE METABOLIC PANEL
ALT: 38 U/L (ref 0–44)
AST: 29 U/L (ref 15–41)
Albumin: 3.8 g/dL (ref 3.5–5.0)
Alkaline Phosphatase: 61 U/L (ref 38–126)
Anion gap: 7 (ref 5–15)
BUN: 13 mg/dL (ref 6–20)
CO2: 29 mmol/L (ref 22–32)
Calcium: 9.2 mg/dL (ref 8.9–10.3)
Chloride: 103 mmol/L (ref 98–111)
Creatinine, Ser: 0.76 mg/dL (ref 0.44–1.00)
GFR calc Af Amer: >60 mL/min (ref >60)
GFR calc non Af Amer: >60 mL/min (ref >60)
Glucose, Bld: 167 mg/dL — ABNORMAL HIGH (ref 70–99)
Potassium: 4 mmol/L (ref 3.5–5.1)
Sodium: 139 mmol/L (ref 135–145)
Total Bilirubin: 0.6 mg/dL (ref 0.3–1.2)
Total Protein: 6.9 g/dL (ref 6.5–8.1)

## 2018-07-18 MED ORDER — DENOSUMAB 60 MG/ML ~~LOC~~ SOSY
60.0000 mg | PREFILLED_SYRINGE | Freq: Once | SUBCUTANEOUS | Status: AC
  Administered 2018-07-18: 60 mg via SUBCUTANEOUS

## 2018-07-18 MED ORDER — DENOSUMAB 60 MG/ML ~~LOC~~ SOSY
PREFILLED_SYRINGE | SUBCUTANEOUS | Status: AC
  Filled 2018-07-18: qty 1

## 2018-07-18 NOTE — Patient Instructions (Signed)

## 2018-07-21 ENCOUNTER — Ambulatory Visit: Payer: BLUE CROSS/BLUE SHIELD

## 2018-07-21 ENCOUNTER — Other Ambulatory Visit: Payer: BLUE CROSS/BLUE SHIELD

## 2018-08-07 ENCOUNTER — Other Ambulatory Visit: Payer: Self-pay | Admitting: Oncology

## 2018-09-24 ENCOUNTER — Other Ambulatory Visit: Payer: Self-pay | Admitting: "Endocrinology

## 2018-10-03 ENCOUNTER — Other Ambulatory Visit: Payer: Self-pay | Admitting: "Endocrinology

## 2018-10-03 DIAGNOSIS — E119 Type 2 diabetes mellitus without complications: Secondary | ICD-10-CM

## 2018-10-04 LAB — LIPID PANEL
Cholesterol: 168 mg/dL (ref <200)
HDL: 41 mg/dL — ABNORMAL LOW (ref >=50)
LDL Cholesterol (Calc): 95 mg/dL (calc)
Non-HDL Cholesterol (Calc): 127 mg/dL (calc) (ref <130)
Total CHOL/HDL Ratio: 4.1 (calc) (ref <5.0)
Triglycerides: 234 mg/dL — ABNORMAL HIGH (ref <150)

## 2018-10-04 LAB — HEMOGLOBIN A1C
Hgb A1c MFr Bld: 6.4 % of total Hgb — ABNORMAL HIGH (ref <5.7)
Mean Plasma Glucose: 137 (calc)
eAG (mmol/L): 7.6 (calc)

## 2018-10-04 LAB — COMPREHENSIVE METABOLIC PANEL
AG Ratio: 2 (calc) (ref 1.0–2.5)
ALT: 41 U/L — ABNORMAL HIGH (ref 6–29)
AST: 32 U/L — ABNORMAL HIGH (ref 10–30)
Albumin: 4.2 g/dL (ref 3.6–5.1)
Alkaline phosphatase (APISO): 46 U/L (ref 31–125)
BUN: 17 mg/dL (ref 7–25)
CO2: 29 mmol/L (ref 20–32)
Calcium: 9.1 mg/dL (ref 8.6–10.2)
Chloride: 104 mmol/L (ref 98–110)
Creat: 0.68 mg/dL (ref 0.50–1.10)
Globulin: 2.1 g/dL (calc) (ref 1.9–3.7)
Glucose, Bld: 142 mg/dL — ABNORMAL HIGH (ref 65–99)
Potassium: 4.2 mmol/L (ref 3.5–5.3)
Sodium: 140 mmol/L (ref 135–146)
Total Bilirubin: 1 mg/dL (ref 0.2–1.2)
Total Protein: 6.3 g/dL (ref 6.1–8.1)

## 2018-10-08 ENCOUNTER — Other Ambulatory Visit: Payer: Self-pay

## 2018-10-08 ENCOUNTER — Encounter: Payer: Self-pay | Admitting: "Endocrinology

## 2018-10-08 ENCOUNTER — Ambulatory Visit (INDEPENDENT_AMBULATORY_CARE_PROVIDER_SITE_OTHER): Payer: BC Managed Care – PPO | Admitting: "Endocrinology

## 2018-10-08 DIAGNOSIS — E782 Mixed hyperlipidemia: Secondary | ICD-10-CM

## 2018-10-08 DIAGNOSIS — E119 Type 2 diabetes mellitus without complications: Secondary | ICD-10-CM | POA: Insufficient documentation

## 2018-10-08 DIAGNOSIS — I10 Essential (primary) hypertension: Secondary | ICD-10-CM | POA: Diagnosis not present

## 2018-10-08 MED ORDER — METFORMIN HCL 500 MG PO TABS: 500.0000 mg | ORAL_TABLET | Freq: Two times a day (BID) | ORAL | 2 refills | Status: DC

## 2018-10-08 NOTE — Progress Notes (Signed)
10/08/2018, 4:30 PM                                                    Endocrinology Telehealth Visit Follow up Note -During COVID -19 Pandemic  This visit type was conducted due to national recommendations for restrictions regarding the COVID-19 Pandemic  in an effort to limit this patient's exposure and mitigate transmission of the corona virus.  Due to her co-morbid illnesses, [name] is at  moderate to high risk for complications without adequate follow up.  This format is felt to be most appropriate for her at this time.  I connected with this patient on 10/08/2018   by telephone and verified that I am speaking with the correct person using two identifiers. [name], 01-Jan-1976. she has verbally consented to this visit. All issues noted in this document were discussed and addressed. The format was not optimal for physical exam.    Subjective:    Patient ID: [name], female    DOB: 05-30-1975.  [name] is being seen in consultation for management of currently uncontrolled symptomatic type 2 diabetes, hypertension. PCP: Patient, No Pcp Per.   Past Medical History:  Diagnosis Date  . Anxiety   . Breast cancer ([place]) 10/16/12   left, lower medial, 8 o'clock  . DVT (deep venous thrombosis) ([place]) 02/2013   RT LEG   . Family history of anesthesia complication    pt's mother has hx. of post-op N/V  . History of radiation therapy 07/06/13-08/20/13   left breast/  . History of transfusion   . Hypertension    under control with med., has been on med. since 09/2012  . Pain in the wrist    BILATERAL  . Personal history of chemotherapy 2014  . Personal history of radiation therapy 2014   Past Surgical History:  Procedure Laterality Date  . BREAST BIOPSY Right 11/2017   benign  . BREAST LUMPECTOMY Left 2014  . BREAST LUMPECTOMY WITH NEEDLE LOCALIZATION AND AXILLARY SENTINEL LYMPH NODE BX  Left 12/10/2012   Procedure: BREAST LUMPECTOMY WITH NEEDLE LOCALIZATION AND AXILLARY SENTINEL LYMPH NODE BX;  Surgeon: Merrie Roof, MD;  Location: [facility];  Service: General;  Laterality: Left;  Needle loc BCG 7:30  nuc med 9:30    . CESAREAN [place]  . CHOLECYSTECTOMY  1999  . LEEP N/A 01/26/2014   Procedure: LOOP ELECTROSURGICAL EXCISION PROCEDURE (LEEP);  Surgeon: Lucita Lora. Alycia Rossetti, MD;  Location: [facility];  Service: Gynecology;  Laterality: N/A;  . OPEN REDUCTION INTERNAL FIXATION (ORIF) TIBIA/FIBULA FRACTURE Right   . PORT-A-CATH REMOVAL    . PORTACATH PLACEMENT Right 01/09/2013   Procedure: INSERTION PORT-A-CATH;  Surgeon: Merrie Roof, MD;  Location: [facility];  Service: General;  Laterality: Right;  . ROBOTIC ASSISTED BILATERAL SALPINGO OOPHERECTOMY Bilateral 01/26/2014   Procedure: ROBOTIC ASSISTED BILATERAL SALPINGO OOPHORECTOMY;  Surgeon: Imagene Gurney A. Alycia Rossetti, MD;  Location: [facility];  Service: Gynecology;  Laterality: Bilateral;  . TONSILLECTOMY AND ADENOIDECTOMY     as a teenager   Social History   Socioeconomic History  . Marital status: Divorced    Spouse name: Not on file  . Number of children: Not on file  . Years of education: Not on file  . Highest education level: Not on file  Occupational History  . Not on file  Social Needs  . Financial resource strain: Not on file  . Food insecurity    Worry: Not on file    Inability: Not on file  . Transportation needs    Medical: Not on file    Non-medical: Not on file  Tobacco Use  . Smoking status: Former Smoker    Quit date: 12/03/2012    Years since quitting: 5.8  . Smokeless tobacco: Never Used  Substance and Sexual Activity  . Alcohol use: Yes    Comment: occasionally  . Drug use: No  . Sexual activity: Yes    Birth control/protection: Condom    Comment: menarche age 54, P54 @ age 63,  last menstrual cycle 09/29/12  Lifestyle  . Physical activity    Days per week: Not on file    Minutes per  session: Not on file  . Stress: Not on file  Relationships  . Social Herbalist on phone: Not on file    Gets together: Not on file    Attends religious service: Not on file    Active member of club or organization: Not on file    Attends meetings of clubs or organizations: Not on file    Relationship status: Not on file  Other Topics Concern  . Not on file  Social History Narrative  . Not on file   Outpatient Encounter Medications as of 10/08/2018  Medication Sig  . b complex vitamins tablet Take 1 tablet by mouth every morning.  Marland Kitchen BIOTIN PO Take 1 tablet by mouth every morning.  . Cholecalciferol 2000 units CAPS Take 1 capsule by mouth daily.  Marland Kitchen letrozole (FEMARA) 2.5 MG tablet Take 2.5 mg by mouth daily.  Marland Kitchen lisinopril (ZESTRIL) 10 MG tablet TAKE 1 TABLET BY MOUTH EVERY DAY IN THE MORNING  . LORazepam (ATIVAN) 0.5 MG tablet Take 1 tablet (0.5 mg total) by mouth at bedtime as needed (Nausea or vomiting). (Patient not taking: Reported on 09/04/2017)  . metFORMIN (GLUCOPHAGE) 500 MG tablet Take 1 tablet (500 mg total) by mouth 2 (two) times daily with a meal.  . [DISCONTINUED] metFORMIN (GLUCOPHAGE) 500 MG tablet TAKE 1 TABLET (500 MG TOTAL) BY MOUTH 2 (TWO) TIMES DAILY WITH A MEAL.   No facility-administered encounter medications on file as of 10/08/2018.     ALLERGIES: Allergies  Allergen Reactions  . Amoxicillin Other (See Comments)    States "it does not work"    VACCINATION STATUS:  There is no immunization history on file for this patient.  Diabetes She presents for her follow-up diabetic visit. She has type 2 diabetes mellitus. Onset time: She was diagnosed at approximate age of 28 years. This is following exposure to high-dose steroids related to treatment for breast cancer. Her disease course has been improving. There are no hypoglycemic associated symptoms. Pertinent negatives for hypoglycemia include no confusion, headaches, pallor or seizures. Pertinent  negatives for diabetes include no chest pain, no fatigue, no polydipsia, no polyphagia and no polyuria. There are no hypoglycemic complications. Symptoms are improving. There are no diabetic complications. Risk factors for coronary artery  disease include diabetes mellitus, hypertension, obesity, tobacco exposure and sedentary lifestyle. Current diabetic treatment includes oral agent (monotherapy). She is following a diabetic diet. When asked about meal planning, she reported none. She has had a previous visit with a dietitian. She participates in exercise intermittently. An ACE inhibitor/angiotensin II receptor blocker is being taken. She does not see a podiatrist.Eye exam is not current.  Hypertension This is a chronic problem. The current episode started more than 1 year ago. The problem is controlled. Pertinent negatives include no chest pain, headaches, palpitations or shortness of breath. Risk factors for coronary artery disease include diabetes mellitus, obesity, sedentary lifestyle, smoking/tobacco exposure and family history. Past treatments include ACE inhibitors.    Review of Systems  Constitutional: Negative for chills, fatigue, fever and unexpected weight change.  HENT: Negative for trouble swallowing and voice change.   Eyes: Negative for visual disturbance.  Respiratory: Negative for cough, shortness of breath and wheezing.   Cardiovascular: Negative for chest pain, palpitations and leg swelling.  Gastrointestinal: Negative for diarrhea, nausea and vomiting.  Endocrine: Negative for cold intolerance, heat intolerance, polydipsia, polyphagia and polyuria.  Musculoskeletal: Negative for arthralgias and myalgias.  Skin: Negative for color change, pallor, rash and wound.  Neurological: Negative for seizures and headaches.  Psychiatric/Behavioral: Negative for confusion and suicidal ideas.    Objective:    There were no vitals taken for this visit.  Wt Readings from Last 3 Encounters:   03/26/18 248 lb (112.5 kg)  01/13/18 240 lb (108.9 kg)  09/04/17 234 lb (106.1 kg)       Recent Results (from the past 2160 hour(s))  Comprehensive metabolic panel     Status: Abnormal   Collection Time: 07/18/18 11:46 AM  Result Value Ref Range   Sodium 139 135 - 145 mmol/L   Potassium 4.0 3.5 - 5.1 mmol/L   Chloride 103 98 - 111 mmol/L   CO2 29 22 - 32 mmol/L   Glucose, Bld 167 (H) 70 - 99 mg/dL   BUN 13 6 - 20 mg/dL   Creatinine, Ser 0.76 0.44 - 1.00 mg/dL   Calcium 9.2 8.9 - 10.3 mg/dL   Total Protein 6.9 6.5 - 8.1 g/dL   Albumin 3.8 3.5 - 5.0 g/dL   AST 29 15 - 41 U/L   ALT 38 0 - 44 U/L   Alkaline Phosphatase 61 38 - 126 U/L   Total Bilirubin 0.6 0.3 - 1.2 mg/dL   GFR calc non Af Amer >60 >60 mL/min   GFR calc Af Amer >60 >60 mL/min   Anion gap 7 5 - 15    Comment: Performed at [facility], [address] [place], [place] [address]  CBC with Differential     Status: None   Collection Time: 07/18/18 11:46 AM  Result Value Ref Range   WBC 10.5 4.0 - 10.5 K/uL   RBC 4.41 3.87 - 5.11 MIL/uL   Hemoglobin 13.8 12.0 - 15.0 g/dL   HCT 41.0 36.0 - 46.0 %   MCV 93.0 80.0 - 100.0 fL   MCH 31.3 26.0 - 34.0 pg   MCHC 33.7 30.0 - 36.0 g/dL   RDW 13.2 11.5 - 15.5 %   Platelets 248 150 - 400 K/uL   nRBC 0.0 0.0 - 0.2 %   Neutrophils Relative % 55 %   Neutro Abs 5.7 1.7 - 7.7 K/uL   Lymphocytes Relative 37 %   Lymphs Abs 3.9 0.7 - 4.0 K/uL   Monocytes  Relative 6 %   Monocytes Absolute 0.6 0.1 - 1.0 K/uL   Eosinophils Relative 2 %   Eosinophils Absolute 0.2 0.0 - 0.5 K/uL   Basophils Relative 0 %   Basophils Absolute 0.0 0.0 - 0.1 K/uL   Immature Granulocytes 0 %   Abs Immature Granulocytes 0.04 0.00 - 0.07 K/uL    Comment: Performed at [facility], [place] [address] [place], [place] [address]  Lipid Panel     Status: Abnormal   Collection Time: 10/03/18  8:54 AM  Result Value Ref Range   Cholesterol 168 <200 mg/dL    HDL 41 (L) > OR = 50 mg/dL   Triglycerides 234 (H) <150 mg/dL    Comment: . If a non-fasting specimen was collected, consider repeat triglyceride testing on a fasting specimen if clinically indicated.  Yates Decamp et al. J. of Clin. Lipidol. 7619;5:093-267. Marland Kitchen    LDL Cholesterol (Calc) 95 mg/dL (calc)    Comment: Reference range: <100 . Desirable range <100 mg/dL for primary prevention;   <70 mg/dL for patients with CHD or diabetic patients  with > or = 2 CHD risk factors. Marland Kitchen LDL-C is now calculated using the Martin-Hopkins  calculation, which is a validated novel method providing  better accuracy than the Friedewald equation in the  estimation of LDL-C.  Cresenciano Genre et al. Annamaria Helling. 1245;809(98): 2061-2068  (http://education.[facility].com/faq/FAQ164)    Total CHOL/HDL Ratio 4.1 <5.0 (calc)   Non-HDL Cholesterol (Calc) 127 <130 mg/dL (calc)    Comment: For patients with diabetes plus 1 major ASCVD risk  factor, treating to a non-HDL-C goal of <100 mg/dL  (LDL-C of <70 mg/dL) is considered a therapeutic  option.   Hemoglobin A1c     Status: Abnormal   Collection Time: 10/03/18  8:54 AM  Result Value Ref Range   Hgb A1c MFr Bld 6.4 (H) <5.7 % of total Hgb    Comment: For someone without known diabetes, a hemoglobin  A1c value between 5.7% and 6.4% is consistent with prediabetes and should be confirmed with a  follow-up test. . For someone with known diabetes, a value <7% indicates that their diabetes is well controlled. A1c targets should be individualized based on duration of diabetes, age, comorbid conditions, and other considerations. . This assay result is consistent with an increased risk of diabetes. . Currently, no consensus exists regarding use of hemoglobin A1c for diagnosis of diabetes for children. .    Mean Plasma Glucose 137 (calc)   eAG (mmol/L) 7.6 (calc)  Comprehensive metabolic panel     Status: Abnormal   Collection Time: 10/03/18  8:54 AM  Result  Value Ref Range   Glucose, Bld 142 (H) 65 - 99 mg/dL    Comment: .            Fasting reference interval . For someone without known diabetes, a glucose value >125 mg/dL indicates that they may have diabetes and this should be confirmed with a follow-up test. .    BUN 17 7 - 25 mg/dL   Creat 0.68 0.50 - 1.10 mg/dL   BUN/Creatinine Ratio NOT APPLICABLE 6 - 22 (calc)   Sodium 140 135 - 146 mmol/L   Potassium 4.2 3.5 - 5.3 mmol/L   Chloride 104 98 - 110 mmol/L   CO2 29 20 - 32 mmol/L   Calcium 9.1 8.6 - 10.2 mg/dL   Total Protein 6.3 6.1 - 8.1 g/dL   Albumin 4.2 3.6 - 5.1 g/dL   Globulin 2.1  1.9 - 3.7 g/dL (calc)   AG Ratio 2.0 1.0 - 2.5 (calc)   Total Bilirubin 1.0 0.2 - 1.2 mg/dL   Alkaline phosphatase (APISO) 46 31 - 125 U/L   AST 32 (H) 10 - 30 U/L   ALT 41 (H) 6 - 29 U/L   Lipid Panel     Component Value Date/Time   CHOL 168 10/03/2018 0854   TRIG 234 (H) 10/03/2018 0854   HDL 41 (L) 10/03/2018 0854   CHOLHDL 4.1 10/03/2018 0854   Celebration 95 10/03/2018 0854     Assessment & Plan:   1. Uncontrolled type 2 diabetes mellitus with hyperglycemia ([place])  - [name] has currently controlled asymptomatic type 2 DM since  43 years of age. -She reports near target glycemic profile at fasting and her A1c 6.4% improving from 7.2%.     - Recent labs reviewed with her.  - She does not report any gross competitions from her diabetes, however [name] remains at a high risk for more acute and chronic complications which include CAD, CVA, CKD, retinopathy, and neuropathy. These are all discussed in detail with the patient.  - I have counseled her on diet management and weight loss, by adopting a carbohydrate restricted/protein rich diet. - she  admits there is a room for improvement in her diet and drink choices. -  Suggestion is made for her to avoid simple carbohydrates  from her diet including Cakes, Sweet Desserts / Pastries, Ice Cream, Soda (diet and regular),  Sweet Tea, Candies, Chips, Cookies, Sweet Pastries,  Store Bought Juices, Alcohol in Excess of  1-2 drinks a day, Artificial Sweeteners, Coffee Creamer, and "Sugar-free" Products. This will help patient to have stable blood glucose profile and potentially avoid unintended weight gain.   - I encouraged her to switch to  unprocessed or minimally processed complex starch and increased protein intake (animal or plant source), fruits, and vegetables.  - she is advised to stick to a routine mealtimes to eat 3 meals  a day and avoid unnecessary snacks ( to snack only to correct hypoglycemia).   - I have approached her with the following individualized plan to manage diabetes and patient agrees:   -Based on her response to metformin therapy with A1c of 6.4%, she would not need insulin treatment for now.   -She is advised to continue metformin 500 mg p.o. twice daily after breakfast and supper.   -She has several choices for treatment if she loses control of diabetes by next visit. - Patient specific target  A1c;  LDL, HDL, Triglycerides, and  Waist Circumference were discussed in detail.  2) BP/HTN: she is advised to home monitor blood pressure and report if > 140/90 on 2 separate readings.  She is advised to continue her current blood pressure medications including lisinopril 10 mg p.o. daily.     3) Lipids/HPL: Her fasting lipid panel shows LDL of 94.   She is not on statins, will be considered for statins if her next fasting lipid panel shows LDL of greater than 70. 4)  Weight/Diet: CDE Consult has been  initiated , exercise, and detailed carbohydrates information provided.  5) Chronic Care/Health Maintenance:  -she  is on ACEI medications and  is encouraged to continue to follow up with Ophthalmology, Dentist,  Podiatrist at least yearly or according to recommendations, and advised to take away from smoking. I have recommended yearly flu vaccine and pneumonia vaccination at least every 5 years;  moderate intensity exercise for  up to 150 minutes weekly; and  sleep for at least 7 hours a day.  - I advised patient to maintain close follow up with Patient, No Pcp Per for primary care needs.  - Patient Care Time Today:  25 min, of which >50% was spent in reviewing her  current and  previous labs/studies, previous treatments, and medications doses and developing a plan for long-term care based on the latest recommendations for standards of care.  [name] participated in the discussions, expressed understanding, and voiced agreement with the above plans.  All questions were answered to her satisfaction. she is encouraged to contact clinic should she have any questions or concerns prior to her return visit.   Follow up plan: - Return in about 6 months (around 04/10/2019) for Follow up with Pre-visit Labs.  Glade Lloyd, MD [facility] [address] [place], [place] [address] Phone: [phone]  Fax: [phone]    10/08/2018, 4:30 PM  This note was partially dictated with voice recognition software. Similar sounding words can be transcribed inadequately or may not  be corrected upon review.

## 2018-11-04 ENCOUNTER — Other Ambulatory Visit: Payer: Self-pay | Admitting: Oncology

## 2018-11-19 ENCOUNTER — Other Ambulatory Visit: Payer: Self-pay | Admitting: Oncology

## 2018-11-19 DIAGNOSIS — Z1231 Encounter for screening mammogram for malignant neoplasm of breast: Secondary | ICD-10-CM

## 2019-01-05 ENCOUNTER — Ambulatory Visit
Admission: RE | Admit: 2019-01-05 | Discharge: 2019-01-05 | Disposition: A | Discharge status: Home / Self Care | Payer: BC Managed Care – PPO | Source: Ambulatory Visit | Attending: Oncology | Admitting: Oncology

## 2019-01-05 ENCOUNTER — Other Ambulatory Visit: Payer: Self-pay

## 2019-01-05 DIAGNOSIS — Z1231 Encounter for screening mammogram for malignant neoplasm of breast: Secondary | ICD-10-CM

## 2019-01-26 NOTE — Progress Notes (Signed)
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   She continues on letrozole, with good tolerance. She has hot flashes that are improving.  She obtains the drug for approximately $6/40-monthsupply.  Her most recent bone density scan completed on 12/17/2017 showed a T-score of -2.5. She continues on denosumab, with her most recent dose on 07/18/2018 and her third dose due today  Since her last visit, she [facility] showing: breast density category B; no evidence of malignancy in either breast.   REVIEW OF SYSTEMS:  SAdriyanais taking appropriate pandemic precautions.  She does go to the office tomorrow but they have barriers and there is a general use of masks.  She took herself off the Effexor with no significant side effects.  She is trying to exercise by walking here and there but does not have a steady program.  A detailed review of systems today was otherwise stable.   BREAST CANCER HISTORY: From Dr KDana Allanoriginal intake nodes:  [place] she was felt to have a new mass within the left breast. An ultrasound on 10/06/2012 showed a 1.6 x 1.3 x 1.3 cm mass at the 8 o'clock position 7 cm from the nipple. At the 12 o'clock position behind and nipple was a circumscribed oval shaped 2 x 1.8 x 1.2 cm lesion perhaps representing a fibroadenoma.  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 The carcinoma was grade 2-3, estrogen receptor 80% positive, progesterone receptor 80% positive, Ki-67 25%, and HER-2/neu negative. Bilateral breast MRI on 10/22/2012 showed biopsy proven carcinoma in the left lower inner quadrant identified and measures approximately 1.9 x 1.9 x 1.8 cm. Well-defined nodule in the right upper outer quadrant posteriorly consistent with a fibroadenoma. Probable liver cysts.There were multiple scattered nonspecific foci in each breast. No other suspicious abnormality was identified. No abnormal appearing lymph nodes.   The patient's subsequent history is as detailed below   PAST MEDICAL HISTORY: Past Medical History:  Diagnosis Date  . Anxiety   . Breast cancer ([facility] 10/16/12   left, lower medial, 8 o'clock  . DVT (deep venous thrombosis) ([facility] 02/2013   RT LEG   . Family history of anesthesia complication    pt's mother has hx. of post-op N/V  . History of radiation therapy 07/06/13-08/20/13   left breast/  . History of transfusion   . Hypertension    under control with med., has been on med. since 09/2012  . Pain in the wrist    BILATERAL  . Personal history of chemotherapy 2014  . Personal history of radiation therapy 2014    PAST SURGICAL HISTORY: Past Surgical History:  Procedure Laterality Date  . BREAST BIOPSY Right 11/2017   benign  . BREAST LUMPECTOMY Left 2014  . BREAST LUMPECTOMY WITH NEEDLE LOCALIZATION AND AXILLARY SENTINEL LYMPH NODE BX Left 12/10/2012   Procedure: BREAST LUMPECTOMY  WITH NEEDLE LOCALIZATION AND AXILLARY SENTINEL LYMPH NODE BX;  Surgeon: Merrie Roof, MD;  Location: [facility];  Service: General;  Laterality: Left;  Needle loc BCG 7:30  nuc med 9:30    . [place]  . CHOLECYSTECTOMY  1999  . LEEP N/A 01/26/2014   Procedure: LOOP ELECTROSURGICAL  EXCISION PROCEDURE (LEEP);  Surgeon: Lucita Lora. Alycia Rossetti, MD;  Location: [facility];  Service: Gynecology;  Laterality: N/A;  . OPEN REDUCTION INTERNAL FIXATION (ORIF) TIBIA/FIBULA FRACTURE Right   . PORT-A-CATH REMOVAL    . PORTACATH PLACEMENT Right 01/09/2013   Procedure: INSERTION PORT-A-CATH;  Surgeon: Merrie Roof, MD;  Location: [facility];  Service: General;  Laterality: Right;  . ROBOTIC ASSISTED BILATERAL SALPINGO OOPHERECTOMY Bilateral 01/26/2014   Procedure: ROBOTIC ASSISTED BILATERAL SALPINGO OOPHORECTOMY;  Surgeon: Imagene Gurney A. Alycia Rossetti, MD;  Location: [facility];  Service: Gynecology;  Laterality: Bilateral;  . TONSILLECTOMY AND ADENOIDECTOMY     as a teenager    FAMILY HISTORY Family History  Problem Relation Age of Onset  . Anesthesia problems Mother        post-op N/V  . Breast cancer Maternal Grandmother   . 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 She is divorced. Her daughter, [name] is now in her third year at [employer] with a minor in art.    ADVANCED DIRECTIVES: (Updated 01/12/2014) Not in place. At her 10/20/2013 visit the patient was given the appropriate documents to complete and notarize at her discretion. She tells me she intends to name her mother, [name], as healthcare power of attorney. She can be reached at [phone]   HEALTH MAINTENANCE: Social History   Tobacco Use  . Smoking status: Former Smoker    Quit date: 12/03/2012    Years since  quitting: 6.1  . Smokeless tobacco: Never Used  Substance Use Topics  . Alcohol use: Yes    Comment: occasionally  . Drug use: No    Colonoscopy: n/a Pap Smear: 03/2013 Eye Exam: 08/2012 Vitamin D Level: pending Lipid Panel: not  Checked recently   Allergies  Allergen Reactions  . Amoxicillin Other (See Comments)    States "it does not work"    Current Outpatient Medications  Medication Sig Dispense Refill  . b complex vitamins tablet Take 1 tablet by mouth every morning.    Marland Kitchen BIOTIN PO Take 1 tablet by mouth every morning.    . Cholecalciferol 2000 units CAPS Take 1 capsule by mouth daily.    Marland Kitchen letrozole (FEMARA) 2.5 MG tablet Take 1 tablet (2.5 mg total) by mouth daily. 90 tablet 4  . lisinopril (ZESTRIL) 10 MG tablet TAKE 1 TABLET BY MOUTH EVERY DAY IN THE MORNING 90 tablet 0  . LORazepam (ATIVAN) 0.5 MG tablet Take 1 tablet (0.5 mg total) by mouth at bedtime as needed (Nausea or vomiting). (Patient not taking: Reported on 09/04/2017) 30 tablet 0  . metFORMIN (GLUCOPHAGE) 500 MG tablet Take 1 tablet (500 mg total) by mouth  2 (two) times daily with a meal. 180 tablet 2   No current facility-administered medications for this visit.     OBJECTIVE: Young white woman in no acute distress  Vitals:   01/27/19 1018  BP: 128/88  Pulse: 78  Resp: 17  Temp: 98.5 F (36.9 C)  SpO2: 100%     Body mass index is 37.8 kg/m.       ECOG FS:0 - Asymptomatic   Sclerae unicteric, EOMs intact Wearing a mask No cervical or supraclavicular adenopathy Lungs no rales or rhonchi Heart regular rate and rhythm Abd soft, nontender, positive bowel sounds MSK no focal spinal tenderness, no upper extremity lymphedema Neuro: nonfocal, well oriented, appropriate affect Breasts: The right breast is benign.  The left breast is status post lumpectomy.  Is status post radiation.  There is no evidence of disease recurrence.  Both axillae are benign.   LAB RESULTS:  CMP     Component Value  Date/Time   NA 140 10/03/2018 0854   NA 135 (L) 01/07/2017 1212   K 4.2 10/03/2018 0854   K 4.1 01/07/2017 1212   CL 104 10/03/2018 0854   CO2 29 10/03/2018 0854   CO2 26 01/07/2017 1212   GLUCOSE 142 (H) 10/03/2018 0854   GLUCOSE 339 (H) 01/07/2017 1212   BUN 17 10/03/2018 0854   BUN 15.0 01/07/2017 1212   CREATININE 0.68 10/03/2018 0854   CREATININE 0.9 01/07/2017 1212   CALCIUM 9.1 10/03/2018 0854   CALCIUM 9.4 01/07/2017 1212   PROT 6.3 10/03/2018 0854   PROT 6.9 01/07/2017 1212   ALBUMIN 3.8 07/18/2018 1146   ALBUMIN 3.8 01/07/2017 1212   AST 32 (H) 10/03/2018 0854   AST 54 (H) 01/07/2017 1212   ALT 41 (H) 10/03/2018 0854   ALT 67 (H) 01/07/2017 1212   ALKPHOS 61 07/18/2018 1146   ALKPHOS 99 01/07/2017 1212   BILITOT 1.0 10/03/2018 0854   BILITOT 1.01 01/07/2017 1212   GFRNONAA >60 07/18/2018 1146   GFRNONAA 108 03/21/2018 0906   GFRAA >60 07/18/2018 1146   GFRAA 125 03/21/2018 0906    I No results found for: SPEP  Lab Results  Component Value Date   WBC 8.3 01/27/2019   NEUTROABS 4.6 01/27/2019   HGB 13.7 01/27/2019   HCT 40.7 01/27/2019   MCV 94.7 01/27/2019   PLT 240 01/27/2019      Chemistry      Component Value Date/Time   NA 140 10/03/2018 0854   NA 135 (L) 01/07/2017 1212   K 4.2 10/03/2018 0854   K 4.1 01/07/2017 1212   CL 104 10/03/2018 0854   CO2 29 10/03/2018 0854   CO2 26 01/07/2017 1212   BUN 17 10/03/2018 0854   BUN 15.0 01/07/2017 1212   CREATININE 0.68 10/03/2018 0854   CREATININE 0.9 01/07/2017 1212      Component Value Date/Time   CALCIUM 9.1 10/03/2018 0854   CALCIUM 9.4 01/07/2017 1212   ALKPHOS 61 07/18/2018 1146   ALKPHOS 99 01/07/2017 1212   AST 32 (H) 10/03/2018 0854   AST 54 (H) 01/07/2017 1212   ALT 41 (H) 10/03/2018 0854   ALT 67 (H) 01/07/2017 1212   BILITOT 1.0 10/03/2018 0854   BILITOT 1.01 01/07/2017 1212       No results found for: LABCA2  No components found for: LABCA125  No results for input(s):  INR in the last 168 hours.  Urinalysis    Component Value Date/Time   COLORURINE YELLOW 01/21/2014  [place] 01/21/2014 1503   LABSPEC 1.014 01/21/2014 1503   LABSPEC 1.010 07/16/2013 1538   PHURINE 6.5 01/21/2014 1503   GLUCOSEU NEGATIVE 01/21/2014 1503   GLUCOSEU Negative 07/16/2013 1538   HGBUR NEGATIVE 01/21/2014 1503   BILIRUBINUR NEGATIVE 01/21/2014 1503   BILIRUBINUR Negative 07/16/2013 1538   KETONESUR NEGATIVE 01/21/2014 1503   PROTEINUR NEGATIVE 01/21/2014 1503   UROBILINOGEN 0.2 01/21/2014 1503   UROBILINOGEN 0.2 07/16/2013 1538   NITRITE NEGATIVE 01/21/2014 1503   LEUKOCYTESUR NEGATIVE 01/21/2014 1503   LEUKOCYTESUR Negative 07/16/2013 1538    STUDIES: Mm 3d Screen Breast Bilateral  Result Date: 01/06/2019 CLINICAL DATA:  Screening. EXAM: DIGITAL SCREENING BILATERAL MAMMOGRAM WITH TOMO AND CAD COMPARISON:  Previous exam(s). ACR Breast Density Category b: There are scattered areas of fibroglandular density. FINDINGS: There are no findings suspicious for malignancy. Images were processed with CAD. IMPRESSION: No mammographic evidence of malignancy. A result letter of this screening mammogram will be mailed directly to the patient. RECOMMENDATION: Screening mammogram in one year. (Code:SM-B-01Y) BI-RADS CATEGORY  1: Negative. Electronically Signed   By: Kristopher Oppenheim M.D.   On: 01/06/2019 07:45    ASSESSMENT: 43 y.o. BRCA negative [place], [place]  status post left breast lower inner quadrant lumpectomy with left axillary sentinel lymph node biopsy 12/10/2012 for a pT2, pN30m, stage IIB invasive ductal carcinoma, grade 3, estrogen receptor 80% positive, progesterone receptor 80% positive, Ki-67 25%, HER-2/neu negative  #[place]2 [place].   #2 s/p adjuvant chemotherapy consisting of FEC (5-FU/epirubicin/Cytoxan) begun on 01/23/2013,completed on 04/03/13 (six cycles).    #3 S/p adjuvant weekly Taxol beginning 04/17/13, received  7 of the 12 planned cycles, last dose 06/05/13 (stopped secondary to skin toxicity)  #6 adjuvant radiation to the left breast and regional lymph nodes completed 08/20/2013  #7 goserelin started 04/17/2013, continued through [email or web address]  (a) s/p BSO 01/26/2014 with benign pathology  #8 anastrozole started 08/14/2013; bone density 08/19/2013 was normal  (a) stopped 04/15/14 because of severe joint stiffness. Switched to letrozole starting 05/03/14  (b) DEXA scan 08/22/2015 showed a T score of -1.7  (c) given history of DVT we are not considering switching to tamoxifen.  (d) bone density 12/17/2017 shows a T score of -2.5--osteoporosis  (e) denosumab/ Prolia started 01/21/2018  #9 Right lower extremity DVT diagnosed on 03/16/2013, patient was placed on Xarelto. Repeat venous Doppler performed in March 2015 was negative for any thrombosis.  Xarelto was stopped Aug 14, 2012.    PLAN:  SKhalilahis now 6 years out from definitive surgery for breast cancer with no evidence of disease recurrence.  This is very favorable.  She is tolerating letrozole well and the plan is to continue that for total of 7 years, which will take as to May 2022.  She will receive denosumab today and in 6 months.  I will see her again with a 665-monthisit.  She will need a repeat bone density late next year.  I encouraged her to develop a regular exercise program and to cut back on the carbohydrates in her diet.  She knows to call for any other issue that may develop before the next visit.   Magrinat, GuVirgie DadMD  01/27/19 10:43 AM Medical Oncology and Hemat[facility] [address]. [phone]  Fax. [phone]  I, KaWilburn Mylaram acting as scribe  for Dr. Sarajane Jews C. Magrinat.  I, Lurline Del MD, have reviewed the above documentation for accuracy and completeness, and I agree with the  above.

## 2019-01-27 ENCOUNTER — Ambulatory Visit: Payer: BLUE CROSS/BLUE SHIELD

## 2019-01-27 ENCOUNTER — Inpatient Hospital Stay: Payer: BC Managed Care – PPO

## 2019-01-27 ENCOUNTER — Telehealth: Payer: Self-pay | Admitting: *Deleted

## 2019-01-27 ENCOUNTER — Inpatient Hospital Stay: Payer: BC Managed Care – PPO | Attending: Oncology

## 2019-01-27 ENCOUNTER — Other Ambulatory Visit: Payer: Self-pay

## 2019-01-27 ENCOUNTER — Other Ambulatory Visit: Payer: Self-pay | Admitting: Oncology

## 2019-01-27 ENCOUNTER — Inpatient Hospital Stay (HOSPITAL_BASED_OUTPATIENT_CLINIC_OR_DEPARTMENT_OTHER): Payer: BC Managed Care – PPO | Admitting: Oncology

## 2019-01-27 VITALS — BP 128/88 | HR 78 | Temp 98.5°F | Resp 17 | Ht 68.0 in | Wt 248.6 lb

## 2019-01-27 DIAGNOSIS — Z7984 Long term (current) use of oral hypoglycemic drugs: Secondary | ICD-10-CM | POA: Diagnosis not present

## 2019-01-27 DIAGNOSIS — Z170 Estrogen receptor positive status [ER+]: Secondary | ICD-10-CM | POA: Diagnosis not present

## 2019-01-27 DIAGNOSIS — Z79811 Long term (current) use of aromatase inhibitors: Secondary | ICD-10-CM | POA: Diagnosis not present

## 2019-01-27 DIAGNOSIS — Z86718 Personal history of other venous thrombosis and embolism: Secondary | ICD-10-CM | POA: Diagnosis not present

## 2019-01-27 DIAGNOSIS — M810 Age-related osteoporosis without current pathological fracture: Secondary | ICD-10-CM | POA: Diagnosis not present

## 2019-01-27 DIAGNOSIS — C50312 Malignant neoplasm of lower-inner quadrant of left female breast: Secondary | ICD-10-CM | POA: Diagnosis present

## 2019-01-27 DIAGNOSIS — Z801 Family history of malignant neoplasm of trachea, bronchus and lung: Secondary | ICD-10-CM | POA: Insufficient documentation

## 2019-01-27 DIAGNOSIS — Z803 Family history of malignant neoplasm of breast: Secondary | ICD-10-CM | POA: Insufficient documentation

## 2019-01-27 DIAGNOSIS — Z79899 Other long term (current) drug therapy: Secondary | ICD-10-CM | POA: Diagnosis not present

## 2019-01-27 DIAGNOSIS — I10 Essential (primary) hypertension: Secondary | ICD-10-CM | POA: Diagnosis not present

## 2019-01-27 DIAGNOSIS — Z923 Personal history of irradiation: Secondary | ICD-10-CM | POA: Insufficient documentation

## 2019-01-27 DIAGNOSIS — Z9221 Personal history of antineoplastic chemotherapy: Secondary | ICD-10-CM | POA: Insufficient documentation

## 2019-01-27 DIAGNOSIS — Z87891 Personal history of nicotine dependence: Secondary | ICD-10-CM | POA: Diagnosis not present

## 2019-01-27 LAB — CBC WITH DIFFERENTIAL/PLATELET
Abs Immature Granulocytes: 0.03 10*3/uL (ref 0.00–0.07)
Basophils Absolute: 0 10*3/uL (ref 0.0–0.1)
Basophils Relative: 0 %
Eosinophils Absolute: 0.2 10*3/uL (ref 0.0–0.5)
Eosinophils Relative: 2 %
HCT: 40.7 % (ref 36.0–46.0)
Hemoglobin: 13.7 g/dL (ref 12.0–15.0)
Immature Granulocytes: 0 %
Lymphocytes Relative: 35 %
Lymphs Abs: 2.9 10*3/uL (ref 0.7–4.0)
MCH: 31.9 pg (ref 26.0–34.0)
MCHC: 33.7 g/dL (ref 30.0–36.0)
MCV: 94.7 fL (ref 80.0–100.0)
Monocytes Absolute: 0.6 10*3/uL (ref 0.1–1.0)
Monocytes Relative: 7 %
Neutro Abs: 4.6 10*3/uL (ref 1.7–7.7)
Neutrophils Relative %: 56 %
Platelets: 240 10*3/uL (ref 150–400)
RBC: 4.3 MIL/uL (ref 3.87–5.11)
RDW: 13 % (ref 11.5–15.5)
WBC: 8.3 10*3/uL (ref 4.0–10.5)
nRBC: 0 % (ref 0.0–0.2)

## 2019-01-27 MED ORDER — LETROZOLE 2.5 MG PO TABS: 2.5000 mg | ORAL_TABLET | Freq: Every day | ORAL | 4 refills | Status: DC

## 2019-01-27 MED ORDER — DENOSUMAB 60 MG/ML ~~LOC~~ SOSY
60.0000 mg | PREFILLED_SYRINGE | Freq: Once | SUBCUTANEOUS | Status: AC
  Administered 2019-01-27: 60 mg via SUBCUTANEOUS

## 2019-01-27 MED ORDER — DENOSUMAB 60 MG/ML ~~LOC~~ SOSY
PREFILLED_SYRINGE | SUBCUTANEOUS | Status: AC
  Filled 2019-01-27: qty 1

## 2019-01-27 NOTE — Patient Instructions (Signed)

## 2019-01-27 NOTE — Telephone Encounter (Signed)
Per MD review- may proceed with Prolia with lab from July 2020 with calcium level of 9.1.

## 2019-01-27 NOTE — Progress Notes (Signed)
Per physician can you CMP from July.

## 2019-01-28 ENCOUNTER — Telehealth: Payer: Self-pay | Admitting: Oncology

## 2019-01-28 NOTE — Telephone Encounter (Signed)
I talk with patient regarding schedule  

## 2019-01-30 ENCOUNTER — Other Ambulatory Visit: Payer: Self-pay | Admitting: Oncology

## 2019-04-09 LAB — COMPLETE METABOLIC PANEL WITH GFR
AG Ratio: 1.9 (calc) (ref 1.0–2.5)
ALT: 48 U/L — ABNORMAL HIGH (ref 6–29)
AST: 39 U/L — ABNORMAL HIGH (ref 10–30)
Albumin: 4.1 g/dL (ref 3.6–5.1)
Alkaline phosphatase (APISO): 50 U/L (ref 31–125)
BUN: 14 mg/dL (ref 7–25)
CO2: 30 mmol/L (ref 20–32)
Calcium: 9.2 mg/dL (ref 8.6–10.2)
Chloride: 104 mmol/L (ref 98–110)
Creat: 0.69 mg/dL (ref 0.50–1.10)
GFR, Est African American: 124 mL/min/{1.73_m2} (ref >=60)
GFR, Est Non African American: 107 mL/min/{1.73_m2} (ref >=60)
Globulin: 2.2 g/dL (calc) (ref 1.9–3.7)
Glucose, Bld: 186 mg/dL — ABNORMAL HIGH (ref 65–99)
Potassium: 4.4 mmol/L (ref 3.5–5.3)
Sodium: 140 mmol/L (ref 135–146)
Total Bilirubin: 0.8 mg/dL (ref 0.2–1.2)
Total Protein: 6.3 g/dL (ref 6.1–8.1)

## 2019-04-09 LAB — VITAMIN D 25 HYDROXY (VIT D DEFICIENCY, FRACTURES): Vit D, 25-Hydroxy: 58 ng/mL (ref 30–100)

## 2019-04-09 LAB — HEMOGLOBIN A1C
Hgb A1c MFr Bld: 7.3 % of total Hgb — ABNORMAL HIGH (ref <5.7)
Mean Plasma Glucose: 163 (calc)
eAG (mmol/L): 9 (calc)

## 2019-04-09 LAB — LIPID PANEL
Cholesterol: 174 mg/dL (ref <200)
HDL: 43 mg/dL — ABNORMAL LOW (ref >=50)
LDL Cholesterol (Calc): 100 mg/dL (calc) — ABNORMAL HIGH
Non-HDL Cholesterol (Calc): 131 mg/dL (calc) — ABNORMAL HIGH (ref <130)
Total CHOL/HDL Ratio: 4 (calc) (ref <5.0)
Triglycerides: 192 mg/dL — ABNORMAL HIGH (ref <150)

## 2019-04-15 ENCOUNTER — Ambulatory Visit (INDEPENDENT_AMBULATORY_CARE_PROVIDER_SITE_OTHER): Payer: BC Managed Care – PPO | Admitting: "Endocrinology

## 2019-04-15 ENCOUNTER — Encounter: Payer: Self-pay | Admitting: "Endocrinology

## 2019-04-15 DIAGNOSIS — E119 Type 2 diabetes mellitus without complications: Secondary | ICD-10-CM

## 2019-04-15 DIAGNOSIS — I10 Essential (primary) hypertension: Secondary | ICD-10-CM

## 2019-04-15 DIAGNOSIS — E782 Mixed hyperlipidemia: Secondary | ICD-10-CM | POA: Diagnosis not present

## 2019-04-15 MED ORDER — ROSUVASTATIN CALCIUM 5 MG PO TABS: 5.0000 mg | ORAL_TABLET | Freq: Every day | ORAL | 1 refills | Status: DC

## 2019-04-15 MED ORDER — METFORMIN HCL ER (OSM) 1000 MG PO TB24: 1000.0000 mg | ORAL_TABLET | Freq: Every day | ORAL | 1 refills | Status: DC

## 2019-04-15 NOTE — Patient Instructions (Signed)

## 2019-04-15 NOTE — Progress Notes (Signed)
04/15/2019, 2:14 PM                                                    Endocrinology Telehealth Visit Follow up Note -During COVID -19 Pandemic  This visit type was conducted due to national recommendations for restrictions regarding the COVID-19 Pandemic  in an effort to limit this patient's exposure and mitigate transmission of the corona virus.  Due to her co-morbid illnesses, [name] is at  moderate to high risk for complications without adequate follow up.  This format is felt to be most appropriate for her at this time.  I connected with this patient on 04/15/2019   by telephone and verified that I am speaking with the correct person using two identifiers. [name], Jun 01, 1975. she has verbally consented to this visit. All issues noted in this document were discussed and addressed. The format was not optimal for physical exam.    Subjective:    Patient ID: [name], female    DOB: [birth year].  [name] is being engaged in telehealth via telephone for management of currently uncontrolled symptomatic type 2 diabetes, hypertension. PCP: Patient, No Pcp Per.   Past Medical History:  Diagnosis Date  . Anxiety   . Breast cancer ([place]) 10/16/12   left, lower medial, 8 o'clock  . DVT (deep venous thrombosis) ([place]) 02/2013   RT LEG   . Family history of anesthesia complication    pt's mother has hx. of post-op N/V  . History of radiation therapy 07/06/13-08/20/13   left breast/  . History of transfusion   . Hypertension    under control with med., has been on med. since 09/2012  . Pain in the wrist    BILATERAL  . Personal history of chemotherapy 2014  . Personal history of radiation therapy 2014   Past Surgical History:  Procedure Laterality Date  . BREAST BIOPSY Right 11/2017   benign  . BREAST LUMPECTOMY Left 2014  . BREAST LUMPECTOMY WITH NEEDLE LOCALIZATION AND AXILLARY SENTINEL  LYMPH NODE BX Left 12/10/2012   Procedure: BREAST LUMPECTOMY WITH NEEDLE LOCALIZATION AND AXILLARY SENTINEL LYMPH NODE BX;  Surgeon: Merrie Roof, MD;  Location: [facility];  Service: General;  Laterality: Left;  Needle loc BCG 7:30  nuc med 9:30    . CESAREAN [place]  . CHOLECYSTECTOMY  1999  . LEEP N/A 01/26/2014   Procedure: LOOP ELECTROSURGICAL EXCISION PROCEDURE (LEEP);  Surgeon: Lucita Lora. Alycia Rossetti, MD;  Location: [facility];  Service: Gynecology;  Laterality: N/A;  . OPEN REDUCTION INTERNAL FIXATION (ORIF) TIBIA/FIBULA FRACTURE Right   . PORT-A-CATH REMOVAL    . PORTACATH PLACEMENT Right 01/09/2013   Procedure: INSERTION PORT-A-CATH;  Surgeon: Merrie Roof, MD;  Location: [facility];  Service: General;  Laterality: Right;  . ROBOTIC ASSISTED BILATERAL SALPINGO OOPHERECTOMY Bilateral 01/26/2014   Procedure: ROBOTIC ASSISTED BILATERAL SALPINGO OOPHORECTOMY;  Surgeon: Imagene Gurney A. Alycia Rossetti, MD;  Location: [facility];  Service:  Gynecology;  Laterality: Bilateral;  . TONSILLECTOMY AND ADENOIDECTOMY     as a teenager   Social History   Socioeconomic History  . Marital status: Divorced    Spouse name: Not on file  . Number of children: Not on file  . Years of education: Not on file  . Highest education level: Not on file  Occupational History  . Not on file  Tobacco Use  . Smoking status: Former Smoker    Quit date: 12/03/2012    Years since quitting: 6.3  . Smokeless tobacco: Never Used  Substance and Sexual Activity  . Alcohol use: Yes    Comment: occasionally  . Drug use: No  . Sexual activity: Yes    Birth control/protection: Condom    Comment: menarche age 15, P46 @ age 59,  last menstrual cycle 09/29/12  Other Topics Concern  . Not on file  Social History Narrative  . Not on file   Social Determinants of Health   Financial Resource Strain:   . Difficulty of Paying Living Expenses: Not on file  Food Insecurity:   . Worried About Charity fundraiser in the Last  Year: Not on file  . Ran Out of Food in the Last Year: Not on file  Transportation Needs:   . Lack of Transportation (Medical): Not on file  . Lack of Transportation (Non-Medical): Not on file  Physical Activity:   . Days of Exercise per Week: Not on file  . Minutes of Exercise per Session: Not on file  Stress:   . Feeling of Stress : Not on file  Social Connections:   . Frequency of Communication with Friends and Family: Not on file  . Frequency of Social Gatherings with Friends and Family: Not on file  . Attends Religious Services: Not on file  . Active Member of Clubs or Organizations: Not on file  . Attends Archivist Meetings: Not on file  . Marital Status: Not on file   Outpatient Encounter Medications as of 04/15/2019  Medication Sig  . b complex vitamins tablet Take 1 tablet by mouth every morning.  Marland Kitchen BIOTIN PO Take 1 tablet by mouth every morning.  . Cholecalciferol 2000 units CAPS Take 1 capsule by mouth daily.  Marland Kitchen letrozole (FEMARA) 2.5 MG tablet Take 1 tablet (2.5 mg total) by mouth daily.  Marland Kitchen lisinopril (ZESTRIL) 10 MG tablet TAKE 1 TABLET BY MOUTH EVERY DAY IN THE MORNING  . LORazepam (ATIVAN) 0.5 MG tablet Take 1 tablet (0.5 mg total) by mouth at bedtime as needed (Nausea or vomiting). (Patient not taking: Reported on 09/04/2017)  . metformin (FORTAMET) 1000 MG (OSM) 24 hr tablet Take 1 tablet (1,000 mg total) by mouth daily after breakfast.  . rosuvastatin (CRESTOR) 5 MG tablet Take 1 tablet (5 mg total) by mouth at bedtime.  . [DISCONTINUED] metFORMIN (GLUCOPHAGE) 500 MG tablet Take 1 tablet (500 mg total) by mouth 2 (two) times daily with a meal.   No facility-administered encounter medications on file as of 04/15/2019.    ALLERGIES: Allergies  Allergen Reactions  . Amoxicillin Other (See Comments)    States "it does not work"    VACCINATION STATUS:  There is no immunization history on file for this patient.  Diabetes She presents for her follow-up  diabetic visit. She has type 2 diabetes mellitus. Onset time: She was diagnosed at approximate age of 44 years. This is following exposure to high-dose steroids related to treatment for breast cancer. Her disease  course has been worsening. There are no hypoglycemic associated symptoms. Pertinent negatives for hypoglycemia include no confusion, headaches, pallor or seizures. Pertinent negatives for diabetes include no chest pain, no fatigue, no polydipsia, no polyphagia and no polyuria. There are no hypoglycemic complications. Symptoms are worsening. There are no diabetic complications. Risk factors for coronary artery disease include diabetes mellitus, hypertension, obesity, tobacco exposure and sedentary lifestyle. Current diabetic treatments: She is taking Metformin 500 mg p.o. twice daily. Her weight is fluctuating minimally. She is following a diabetic diet. When asked about meal planning, she reported none. She has had a previous visit with a dietitian. She participates in exercise intermittently. An ACE inhibitor/angiotensin II receptor blocker is being taken. She does not see a podiatrist.Eye exam is not current.  Hypertension This is a chronic problem. The current episode started more than 1 year ago. The problem is controlled. Pertinent negatives include no chest pain, headaches, palpitations or shortness of breath. Risk factors for coronary artery disease include diabetes mellitus, obesity, sedentary lifestyle, smoking/tobacco exposure and family history. Past treatments include ACE inhibitors.    Review of systems: Limited as above.  Objective:    There were no vitals taken for this visit.  Wt Readings from Last 3 Encounters:  01/27/19 248 lb 9.6 oz (112.8 kg)  03/26/18 248 lb (112.5 kg)  01/13/18 240 lb (108.9 kg)       Recent Results (from the past 2160 hour(s))  CBC with Differential     Status: None   Collection Time: 01/27/19 10:08 AM  Result Value Ref Range   WBC 8.3 4.0 - 10.5  K/uL   RBC 4.30 3.87 - 5.11 MIL/uL   Hemoglobin 13.7 12.0 - 15.0 g/dL   HCT 40.7 36.0 - 46.0 %   MCV 94.7 80.0 - 100.0 fL   MCH 31.9 26.0 - 34.0 pg   MCHC 33.7 30.0 - 36.0 g/dL   RDW 13.0 11.5 - 15.5 %   Platelets 240 150 - 400 K/uL   nRBC 0.0 0.0 - 0.2 %   Neutrophils Relative % 56 %   Neutro Abs 4.6 1.7 - 7.7 K/uL   Lymphocytes Relative 35 %   Lymphs Abs 2.9 0.7 - 4.0 K/uL   Monocytes Relative 7 %   Monocytes Absolute 0.6 0.1 - 1.0 K/uL   Eosinophils Relative 2 %   Eosinophils Absolute 0.2 0.0 - 0.5 K/uL   Basophils Relative 0 %   Basophils Absolute 0.0 0.0 - 0.1 K/uL   Immature Granulocytes 0 %   Abs Immature Granulocytes 0.03 0.00 - 0.07 K/uL    Comment: Performed at [facility], [place] [address], [place], [place] [address]  Lipid panel     Status: Abnormal   Collection Time: 04/08/19  9:02 AM  Result Value Ref Range   Cholesterol 174 <200 mg/dL   HDL 43 (L) > OR = 50 mg/dL   Triglycerides 192 (H) <150 mg/dL   LDL Cholesterol (Calc) 100 (H) mg/dL (calc)    Comment: Reference range: <100 . Desirable range <100 mg/dL for primary prevention;   <70 mg/dL for patients with CHD or diabetic patients  with > or = 2 CHD risk factors. Marland Kitchen LDL-C is now calculated using the Martin-Hopkins  calculation, which is a validated novel method providing  better accuracy than the Friedewald equation in the  estimation of LDL-C.  Cresenciano Genre et al. Annamaria Helling. [id number]): 2061-2068  (http://education.[facility].com/faq/FAQ164)    Total CHOL/HDL Ratio 4.0 <5.0 (calc)  Non-HDL Cholesterol (Calc) 131 (H) <130 mg/dL (calc)    Comment: For patients with diabetes plus 1 major ASCVD risk  factor, treating to a non-HDL-C goal of <100 mg/dL  (LDL-C of <70 mg/dL) is considered a therapeutic  option.   Hemoglobin A1c     Status: Abnormal   Collection Time: 04/08/19  9:02 AM  Result Value Ref Range   Hgb A1c MFr Bld 7.3 (H) <5.7 % of total Hgb    Comment: For someone  without known diabetes, a hemoglobin A1c value of 6.5% or greater indicates that they may have  diabetes and this should be confirmed with a follow-up  test. . For someone with known diabetes, a value <7% indicates  that their diabetes is well controlled and a value  greater than or equal to 7% indicates suboptimal  control. A1c targets should be individualized based on  duration of diabetes, age, comorbid conditions, and  other considerations. . Currently, no consensus exists regarding use of hemoglobin A1c for diagnosis of diabetes for children. .    Mean Plasma Glucose 163 (calc)   eAG (mmol/L) 9.0 (calc)  COMPLETE METABOLIC PANEL WITH GFR     Status: Abnormal   Collection Time: 04/08/19  9:02 AM  Result Value Ref Range   Glucose, Bld 186 (H) 65 - 99 mg/dL    Comment: .            Fasting reference interval . For someone without known diabetes, a glucose value >125 mg/dL indicates that they may have diabetes and this should be confirmed with a follow-up test. .    BUN 14 7 - 25 mg/dL   Creat 0.69 0.50 - 1.10 mg/dL   GFR, Est Non African American 107 > OR = 60 mL/min/1.49m2   GFR, Est African American 124 > OR = 60 mL/min/1.72m2   BUN/Creatinine Ratio NOT APPLICABLE 6 - 22 (calc)   Sodium 140 135 - 146 mmol/L   Potassium 4.4 3.5 - 5.3 mmol/L   Chloride 104 98 - 110 mmol/L   CO2 30 20 - 32 mmol/L   Calcium 9.2 8.6 - 10.2 mg/dL   Total Protein 6.3 6.1 - 8.1 g/dL   Albumin 4.1 3.6 - 5.1 g/dL   Globulin 2.2 1.9 - 3.7 g/dL (calc)   AG Ratio 1.9 1.0 - 2.5 (calc)   Total Bilirubin 0.8 0.2 - 1.2 mg/dL   Alkaline phosphatase (APISO) 50 31 - 125 U/L   AST 39 (H) 10 - 30 U/L   ALT 48 (H) 6 - 29 U/L  VITAMIN D 25 Hydroxy (Vit-D Deficiency, Fractures)     Status: None   Collection Time: 04/08/19  9:02 AM  Result Value Ref Range   Vit D, 25-Hydroxy 58 30 - 100 ng/mL    Comment: Vitamin D Status         25-OH Vitamin D: . Deficiency:                    <20  ng/mL Insufficiency:             20 - 29 ng/mL Optimal:                 > or = 30 ng/mL . For 25-OH Vitamin D testing on patients on  D2-supplementation and patients for whom quantitation  of D2 and D3 fractions is required, the [facility](TM) 25-OH VIT D, (D2,D3), LC/MS/MS is recommended: order  code [id number] (patients >17yrs). See Note 1 . Note  1 . For additional information, please refer to  http://education.[facility].com/faq/FAQ199  (This link is being provided for informational/ educational purposes only.)    Lipid Panel     Component Value Date/Time   CHOL 174 04/08/2019 0902   TRIG 192 (H) 04/08/2019 0902   HDL 43 (L) 04/08/2019 0902   CHOLHDL 4.0 04/08/2019 0902   LDLCALC 100 (H) 04/08/2019 0902     Assessment & Plan:   1. Uncontrolled type 2 diabetes mellitus with hyperglycemia ([place])  - Areisy Danz has currently controlled asymptomatic type 2 DM since  44 years of age. -Her previsit labs show A1c of 7.3%, increasing from 6.4%.    - Recent labs reviewed with her.  - She does not report any gross competitions from her diabetes, however Amelinda Sieja remains at a high risk for more acute and chronic complications which include CAD, CVA, CKD, retinopathy, and neuropathy. These are all discussed in detail with the patient.  - I have counseled her on diet management and weight loss, by adopting a carbohydrate restricted/protein rich diet.  - she  admits there is a room for improvement in her diet and drink choices. -  Suggestion is made for her to avoid simple carbohydrates  from her diet including Cakes, Sweet Desserts / Pastries, Ice Cream, Soda (diet and regular), Sweet Tea, Candies, Chips, Cookies, Sweet Pastries,  Store Bought Juices, Alcohol in Excess of  1-2 drinks a day, Artificial Sweeteners, Coffee Creamer, and "Sugar-free" Products. This will help patient to have stable blood glucose profile and potentially avoid unintended weight gain.  - I  encouraged her to switch to  unprocessed or minimally processed complex starch and increased protein intake (animal or plant source), fruits, and vegetables.  - she is advised to stick to a routine mealtimes to eat 3 meals  a day and avoid unnecessary snacks ( to snack only to correct hypoglycemia).   - I have approached her with the following individualized plan to manage diabetes and patient agrees:   -Based on her response to metformin therapy , she would not need additional treatment, she tended to forget her second dose of Metformin. -I discussed and increased her Metformin to 1000 mg ER daily after breakfast.     -She has several choices for treatment if she loses control of diabetes by next visit. - Patient specific target  A1c;  LDL, HDL, Triglycerides, and  Waist Circumference were discussed in detail.  2) BP/HTN: she is advised to home monitor blood pressure and report if > 140/90 on 2 separate readings.   She is advised to continue her current blood pressure medications including lisinopril 10 mg p.o. daily.     3) Lipids/HPL: Her fasting lipid panel shows LDL increasing to 100.  I discussed and initiated Crestor 10 mg p.o. nightly.   4)  Weight/Diet: CDE Consult has been  initiated , exercise, and detailed carbohydrates information provided.  5) Chronic Care/Health Maintenance:  -she  is on ACEI medications and  is encouraged to continue to follow up with Ophthalmology, Dentist,  Podiatrist at least yearly or according to recommendations, and advised to take away from smoking. I have recommended yearly flu vaccine and pneumonia vaccination at least every 5 years; moderate intensity exercise for up to 150 minutes weekly; and  sleep for at least 7 hours a day.  - I advised patient to maintain close follow up with Patient, No Pcp Per for primary care needs.  - Time spent on this patient care encounter:  35 min, of which >50% was spent in  counseling and the rest reviewing her   current and  previous labs/studies ( including abstraction from other facilities),  previous treatments, her blood glucose readings, and medications' doses and developing a plan for long-term care based on the latest recommendations for standards of care; and documenting her care.  [name] participated in the discussions, expressed understanding, and voiced agreement with the above plans.  All questions were answered to her satisfaction. she is encouraged to contact clinic should she have any questions or concerns prior to her return visit.   Follow up plan: - Return in about 3 months (around 07/14/2019) for Next Visit A1c in Office.  Glade Lloyd, MD [facility] [address] [place], [place] [address] Phone: [phone]  Fax: [phone]    04/15/2019, 2:14 PM  This note was partially dictated with voice recognition software. Similar sounding words can be transcribed inadequately or may not  be corrected upon review.

## 2019-04-25 ENCOUNTER — Other Ambulatory Visit: Payer: Self-pay | Admitting: Oncology

## 2019-07-16 ENCOUNTER — Ambulatory Visit: Payer: BC Managed Care – PPO | Admitting: "Endocrinology

## 2019-07-23 ENCOUNTER — Other Ambulatory Visit: Payer: Self-pay | Admitting: Oncology

## 2019-07-27 ENCOUNTER — Ambulatory Visit: Payer: BC Managed Care – PPO | Admitting: "Endocrinology

## 2019-07-27 ENCOUNTER — Other Ambulatory Visit: Payer: Self-pay

## 2019-07-27 ENCOUNTER — Encounter: Payer: Self-pay | Admitting: "Endocrinology

## 2019-07-27 ENCOUNTER — Inpatient Hospital Stay: Payer: BC Managed Care – PPO

## 2019-07-27 ENCOUNTER — Inpatient Hospital Stay: Payer: BC Managed Care – PPO | Attending: Oncology

## 2019-07-27 VITALS — BP 120/83 | HR 81 | Temp 98.2°F | Resp 18

## 2019-07-27 VITALS — BP 127/84 | HR 83 | Ht 68.0 in | Wt 237.2 lb

## 2019-07-27 DIAGNOSIS — Z923 Personal history of irradiation: Secondary | ICD-10-CM | POA: Diagnosis not present

## 2019-07-27 DIAGNOSIS — E782 Mixed hyperlipidemia: Secondary | ICD-10-CM

## 2019-07-27 DIAGNOSIS — C50312 Malignant neoplasm of lower-inner quadrant of left female breast: Secondary | ICD-10-CM

## 2019-07-27 DIAGNOSIS — Z79899 Other long term (current) drug therapy: Secondary | ICD-10-CM | POA: Insufficient documentation

## 2019-07-27 DIAGNOSIS — Z9221 Personal history of antineoplastic chemotherapy: Secondary | ICD-10-CM | POA: Insufficient documentation

## 2019-07-27 DIAGNOSIS — M810 Age-related osteoporosis without current pathological fracture: Secondary | ICD-10-CM | POA: Insufficient documentation

## 2019-07-27 DIAGNOSIS — Z79811 Long term (current) use of aromatase inhibitors: Secondary | ICD-10-CM | POA: Diagnosis not present

## 2019-07-27 DIAGNOSIS — Z170 Estrogen receptor positive status [ER+]: Secondary | ICD-10-CM | POA: Insufficient documentation

## 2019-07-27 DIAGNOSIS — E119 Type 2 diabetes mellitus without complications: Secondary | ICD-10-CM | POA: Diagnosis not present

## 2019-07-27 DIAGNOSIS — I10 Essential (primary) hypertension: Secondary | ICD-10-CM

## 2019-07-27 LAB — CBC WITH DIFFERENTIAL/PLATELET
Abs Immature Granulocytes: 0.03 10*3/uL (ref 0.00–0.07)
Basophils Absolute: 0.1 10*3/uL (ref 0.0–0.1)
Basophils Relative: 1 %
Eosinophils Absolute: 0.1 10*3/uL (ref 0.0–0.5)
Eosinophils Relative: 1 %
HCT: 41.4 % (ref 36.0–46.0)
Hemoglobin: 13.6 g/dL (ref 12.0–15.0)
Immature Granulocytes: 0 %
Lymphocytes Relative: 32 %
Lymphs Abs: 3.2 10*3/uL (ref 0.7–4.0)
MCH: 31.8 pg (ref 26.0–34.0)
MCHC: 32.9 g/dL (ref 30.0–36.0)
MCV: 96.7 fL (ref 80.0–100.0)
Monocytes Absolute: 0.5 10*3/uL (ref 0.1–1.0)
Monocytes Relative: 5 %
Neutro Abs: 6.2 10*3/uL (ref 1.7–7.7)
Neutrophils Relative %: 61 %
Platelets: 257 10*3/uL (ref 150–400)
RBC: 4.28 MIL/uL (ref 3.87–5.11)
RDW: 13.4 % (ref 11.5–15.5)
WBC: 10 10*3/uL (ref 4.0–10.5)
nRBC: 0 % (ref 0.0–0.2)

## 2019-07-27 LAB — COMPREHENSIVE METABOLIC PANEL
ALT: 29 U/L (ref 0–44)
AST: 22 U/L (ref 15–41)
Albumin: 3.9 g/dL (ref 3.5–5.0)
Alkaline Phosphatase: 65 U/L (ref 38–126)
Anion gap: 10 (ref 5–15)
BUN: 13 mg/dL (ref 6–20)
CO2: 27 mmol/L (ref 22–32)
Calcium: 9.6 mg/dL (ref 8.9–10.3)
Chloride: 104 mmol/L (ref 98–111)
Creatinine, Ser: 0.84 mg/dL (ref 0.44–1.00)
GFR calc Af Amer: >60 mL/min (ref >60)
GFR calc non Af Amer: >60 mL/min (ref >60)
Glucose, Bld: 148 mg/dL — ABNORMAL HIGH (ref 70–99)
Potassium: 4.1 mmol/L (ref 3.5–5.1)
Sodium: 141 mmol/L (ref 135–145)
Total Bilirubin: 0.9 mg/dL (ref 0.3–1.2)
Total Protein: 6.9 g/dL (ref 6.5–8.1)

## 2019-07-27 LAB — HEMOGLOBIN A1C: Hemoglobin A1C: 5.8

## 2019-07-27 MED ORDER — DENOSUMAB 60 MG/ML ~~LOC~~ SOSY
PREFILLED_SYRINGE | SUBCUTANEOUS | Status: AC
  Filled 2019-07-27: qty 1

## 2019-07-27 MED ORDER — DENOSUMAB 60 MG/ML ~~LOC~~ SOSY
60.0000 mg | PREFILLED_SYRINGE | Freq: Once | SUBCUTANEOUS | Status: AC
  Administered 2019-07-27: 60 mg via SUBCUTANEOUS

## 2019-07-27 NOTE — Patient Instructions (Signed)
Denosumab injection What is this medicine? DENOSUMAB (den oh sue mab) slows bone breakdown. Prolia is used to treat osteoporosis in women after menopause and in men, and in people who are taking corticosteroids for 6 months or more. Xgeva is used to treat a high calcium level due to cancer and to prevent bone fractures and other bone problems caused by multiple myeloma or cancer bone metastases. Xgeva is also used to treat giant cell tumor of the bone. This medicine may be used for other purposes; ask your health care provider or pharmacist if you have questions. COMMON BRAND NAME(S): Prolia, XGEVA What should I tell my health care provider before I take this medicine? They need to know if you have any of these conditions:  dental disease  having surgery or tooth extraction  infection  kidney disease  low levels of calcium or Vitamin D in the blood  malnutrition  on hemodialysis  skin conditions or sensitivity  thyroid or parathyroid disease  an unusual reaction to denosumab, other medicines, foods, dyes, or preservatives  pregnant or trying to get pregnant  breast-feeding How should I use this medicine? This medicine is for injection under the skin. It is given by a health care professional in a hospital or clinic setting. A special MedGuide will be given to you before each treatment. Be sure to read this information carefully each time. For Prolia, talk to your pediatrician regarding the use of this medicine in children. Special care may be needed. For Xgeva, talk to your pediatrician regarding the use of this medicine in children. While this drug may be prescribed for children as young as 13 years for selected conditions, precautions do apply. Overdosage: If you think you have taken too much of this medicine contact a poison control center or emergency room at once. NOTE: This medicine is only for you. Do not share this medicine with others. What if I miss a dose? It is  important not to miss your dose. Call your doctor or health care professional if you are unable to keep an appointment. What may interact with this medicine? Do not take this medicine with any of the following medications:  other medicines containing denosumab This medicine may also interact with the following medications:  medicines that lower your chance of fighting infection  steroid medicines like prednisone or cortisone This list may not describe all possible interactions. Give your health care provider a list of all the medicines, herbs, non-prescription drugs, or dietary supplements you use. Also tell them if you smoke, drink alcohol, or use illegal drugs. Some items may interact with your medicine. What should I watch for while using this medicine? Visit your doctor or health care professional for regular checks on your progress. Your doctor or health care professional may order blood tests and other tests to see how you are doing. Call your doctor or health care professional for advice if you get a fever, chills or sore throat, or other symptoms of a cold or flu. Do not treat yourself. This drug may decrease your body's ability to fight infection. Try to avoid being around people who are sick. You should make sure you get enough calcium and vitamin D while you are taking this medicine, unless your doctor tells you not to. Discuss the foods you eat and the vitamins you take with your health care professional. See your dentist regularly. Brush and floss your teeth as directed. Before you have any dental work done, tell your dentist you are   receiving this medicine. Do not become pregnant while taking this medicine or for 5 months after stopping it. Talk with your doctor or health care professional about your birth control options while taking this medicine. Women should inform their doctor if they wish to become pregnant or think they might be pregnant. There is a potential for serious side  effects to an unborn child. Talk to your health care professional or pharmacist for more information. What side effects may I notice from receiving this medicine? Side effects that you should report to your doctor or health care professional as soon as possible:  allergic reactions like skin rash, itching or hives, swelling of the face, lips, or tongue  bone pain  breathing problems  dizziness  jaw pain, especially after dental work  redness, blistering, peeling of the skin  signs and symptoms of infection like fever or chills; cough; sore throat; pain or trouble passing urine  signs of low calcium like fast heartbeat, muscle cramps or muscle pain; pain, tingling, numbness in the hands or feet; seizures  unusual bleeding or bruising  unusually weak or tired Side effects that usually do not require medical attention (report to your doctor or health care professional if they continue or are bothersome):  constipation  diarrhea  headache  joint pain  loss of appetite  muscle pain  runny nose  tiredness  upset stomach This list may not describe all possible side effects. Call your doctor for medical advice about side effects. You may report side effects to FDA at 1-800-FDA-1088. Where should I keep my medicine? This medicine is only given in a clinic, doctor's office, or other health care setting and will not be stored at home. NOTE: This sheet is a summary. It may not cover all possible information. If you have questions about this medicine, talk to your doctor, pharmacist, or health care provider.  2020 Elsevier/Gold Standard (2017-07-12 16:10:44)

## 2019-07-27 NOTE — Progress Notes (Signed)
07/27/2019, 9:29 AM   Endocrinology follow-up note    Subjective:    Patient ID: [name], female    DOB: May 01, 1975.  [name] is being seen in follow-up  for management of currently uncontrolled symptomatic type 2 diabetes, hypertension, hyperlipidemia.  PCP: Patient, No Pcp Per.   Past Medical History:  Diagnosis Date  . Anxiety   . Breast cancer ([place]) 10/16/12   left, lower medial, 8 o'clock  . DVT (deep venous thrombosis) ([place]) 02/2013   RT LEG   . Family history of anesthesia complication    pt's mother has hx. of post-op N/V  . History of radiation therapy 07/06/13-08/20/13   left breast/  . History of transfusion   . Hypertension    under control with med., has been on med. since 09/2012  . Pain in the wrist    BILATERAL  . Personal history of chemotherapy 2014  . Personal history of radiation therapy 2014   Past Surgical History:  Procedure Laterality Date  . BREAST BIOPSY Right 11/2017   benign  . BREAST LUMPECTOMY Left 2014  . BREAST LUMPECTOMY WITH NEEDLE LOCALIZATION AND AXILLARY SENTINEL LYMPH NODE BX Left 12/10/2012   Procedure: BREAST LUMPECTOMY WITH NEEDLE LOCALIZATION AND AXILLARY SENTINEL LYMPH NODE BX;  Surgeon: Merrie Roof, MD;  Location: [facility];  Service: General;  Laterality: Left;  Needle loc BCG 7:30  nuc med 9:30    . CESAREAN [place]  . CHOLECYSTECTOMY  1999  . LEEP N/A 01/26/2014   Procedure: LOOP ELECTROSURGICAL EXCISION PROCEDURE (LEEP);  Surgeon: Lucita Lora. Alycia Rossetti, MD;  Location: [facility];  Service: Gynecology;  Laterality: N/A;  . OPEN REDUCTION INTERNAL FIXATION (ORIF) TIBIA/FIBULA FRACTURE Right   . PORT-A-CATH REMOVAL    . PORTACATH PLACEMENT Right 01/09/2013   Procedure: INSERTION PORT-A-CATH;  Surgeon: Merrie Roof, MD;  Location: [facility];  Service: General;  Laterality: Right;  . ROBOTIC ASSISTED BILATERAL SALPINGO  OOPHERECTOMY Bilateral 01/26/2014   Procedure: ROBOTIC ASSISTED BILATERAL SALPINGO OOPHORECTOMY;  Surgeon: Imagene Gurney A. Alycia Rossetti, MD;  Location: [facility];  Service: Gynecology;  Laterality: Bilateral;  . TONSILLECTOMY AND ADENOIDECTOMY     as a teenager   Social History   Socioeconomic History  . Marital status: Divorced    Spouse name: Not on file  . Number of children: Not on file  . Years of education: Not on file  . Highest education level: Not on file  Occupational History  . Not on file  Tobacco Use  . Smoking status: Former Smoker    Quit date: 12/03/2012    Years since quitting: 6.6  . Smokeless tobacco: Never Used  Substance and Sexual Activity  . Alcohol use: Yes    Comment: occasionally  . Drug use: No  . Sexual activity: Yes    Birth control/protection: Condom    Comment: menarche age 49, P92 @ age 56,  last menstrual cycle 09/29/12  Other Topics Concern  . Not on file  Social History Narrative  . Not on file   Social Determinants of Health   Financial Resource Strain:   .  Difficulty of Paying Living Expenses:   Food Insecurity:   . Worried About Charity fundraiser in the Last Year:   . Arboriculturist in the Last Year:   Transportation Needs:   . Film/video editor (Medical):   Marland Kitchen Lack of Transportation (Non-Medical):   Physical Activity:   . Days of Exercise per Week:   . Minutes of Exercise per Session:   Stress:   . Feeling of Stress :   Social Connections:   . Frequency of Communication with Friends and Family:   . Frequency of Social Gatherings with Friends and Family:   . Attends Religious Services:   . Active Member of Clubs or Organizations:   . Attends Archivist Meetings:   Marland Kitchen Marital Status:    Outpatient Encounter Medications as of 07/27/2019  Medication Sig  . b complex vitamins tablet Take 1 tablet by mouth every morning.  Marland Kitchen BIOTIN PO Take 1 tablet by mouth every morning.  . Cholecalciferol 2000 units CAPS Take 1 capsule by mouth  daily.  Marland Kitchen letrozole (FEMARA) 2.5 MG tablet Take 1 tablet (2.5 mg total) by mouth daily.  Marland Kitchen lisinopril (ZESTRIL) 10 MG tablet TAKE 1 TABLET BY MOUTH EVERY DAY IN THE MORNING  . metformin (FORTAMET) 1000 MG (OSM) 24 hr tablet Take 1 tablet (1,000 mg total) by mouth daily after breakfast.  . [DISCONTINUED] LORazepam (ATIVAN) 0.5 MG tablet Take 1 tablet (0.5 mg total) by mouth at bedtime as needed (Nausea or vomiting). (Patient not taking: Reported on 09/04/2017)  . [DISCONTINUED] rosuvastatin (CRESTOR) 5 MG tablet Take 1 tablet (5 mg total) by mouth at bedtime.   No facility-administered encounter medications on file as of 07/27/2019.    ALLERGIES: Allergies  Allergen Reactions  . Amoxicillin Other (See Comments)    States "it does not work"    VACCINATION STATUS:  There is no immunization history on file for this patient.  Diabetes She presents for her follow-up diabetic visit. She has type 2 diabetes mellitus. Onset time: She was diagnosed at approximate age of 61 years. This is following exposure to high-dose steroids related to treatment for breast cancer. Her disease course has been improving. There are no hypoglycemic associated symptoms. Pertinent negatives for hypoglycemia include no confusion, headaches, pallor or seizures. Pertinent negatives for diabetes include no chest pain, no fatigue, no polydipsia, no polyphagia and no polyuria. There are no hypoglycemic complications. Symptoms are improving. There are no diabetic complications. Risk factors for coronary artery disease include diabetes mellitus, hypertension, obesity, tobacco exposure and sedentary lifestyle. Current diabetic treatments: She is taking Metformin 500 mg p.o. twice daily. Her weight is decreasing steadily. She is following a diabetic diet. When asked about meal planning, she reported none. She has had a previous visit with a dietitian. She participates in exercise intermittently. Her home blood glucose trend is  decreasing steadily. (She presents with controlled glycemic profile.  Point-of-care A1c 5.8% improving from 7.3%.) An ACE inhibitor/angiotensin II receptor blocker is being taken. She does not see a podiatrist.Eye exam is not current.  Hypertension This is a chronic problem. The current episode started more than 1 year ago. The problem is controlled. Pertinent negatives include no chest pain, headaches, palpitations or shortness of breath. Risk factors for coronary artery disease include diabetes mellitus, obesity, sedentary lifestyle, smoking/tobacco exposure and family history. Past treatments include ACE inhibitors.     Review of systems  Constitutional: + Minimally fluctuating body weight,  current  Body mass  index is 36.07 kg/m. , no fatigue, no subjective hyperthermia, no subjective hypothermia Eyes: no blurry vision, no xerophthalmia ENT: no sore throat, no nodules palpated in throat, no dysphagia/odynophagia, no hoarseness Cardiovascular: no Chest Pain, no Shortness of Breath, no palpitations, no leg swelling Respiratory: no cough, no shortness of breath Gastrointestinal: no Nausea/Vomiting/Diarhhea Musculoskeletal: no muscle/joint aches Skin: no rashes, no hyperemia Neurological: no tremors, no numbness, no tingling, no dizziness Psychiatric: no depression, no anxiety   Objective:    BP 127/84   Pulse 83   Ht 5\' 8"  (1.727 m)   Wt 237 lb 3.2 oz (107.6 kg)   BMI 36.07 kg/m   Wt Readings from Last 3 Encounters:  07/27/19 237 lb 3.2 oz (107.6 kg)  01/27/19 248 lb 9.6 oz (112.8 kg)  03/26/18 248 lb (112.5 kg)      Physical Exam- Limited  Constitutional:  Body mass index is 36.07 kg/m. , not in acute distress, normal state of mind Eyes:  EOMI, no exophthalmos Neck: Supple Thyroid: No gross goiter Respiratory: Adequate breathing efforts Musculoskeletal: no gross deformities, strength intact in all four extremities, no gross restriction of joint movements Skin:  no  rashes, no hyperemia Neurological: no tremor with outstretched hands,    No results found for this or any previous visit (from the past 2160 hour(s)). Lipid Panel     Component Value Date/Time   CHOL 174 04/08/2019 0902   TRIG 192 (H) 04/08/2019 0902   HDL 43 (L) 04/08/2019 0902   CHOLHDL 4.0 04/08/2019 0902   LDLCALC 100 (H) 04/08/2019 0902     Assessment & Plan:   1. Uncontrolled type 2 diabetes mellitus with hyperglycemia ([place])  - Vaune Mcfarlan has currently controlled asymptomatic type 2 DM since  44 years of age.  She presents with controlled glycemic profile.  Point-of-care A1c 5.8% improving from 7.3%.  - Recent labs reviewed with her.  - She does not report any gross competitions from her diabetes, however Annamarie Dechristopher remains at a high risk for more acute and chronic complications which include CAD, CVA, CKD, retinopathy, and neuropathy. These are all discussed in detail with the patient.  - I have counseled her on diet management and weight loss, by adopting a carbohydrate restricted/protein rich diet.  - she have made significant changes in her lifestyle, lost 11 pounds since last visit.    -  Suggestion is made for her to avoid simple carbohydrates  from her diet including Cakes, Sweet Desserts / Pastries, Ice Cream, Soda (diet and regular), Sweet Tea, Candies, Chips, Cookies, Sweet Pastries,  Store Bought Juices, Alcohol in Excess of  1-2 drinks a day, Artificial Sweeteners, Coffee Creamer, and "Sugar-free" Products. This will help patient to have stable blood glucose profile and potentially avoid unintended weight gain.   - I encouraged her to switch to  unprocessed or minimally processed complex starch and increased protein intake (animal or plant source), fruits, and vegetables.  - she is advised to stick to a routine mealtimes to eat 3 meals  a day and avoid unnecessary snacks ( to snack only to correct hypoglycemia).   - I have approached her with the  following individualized plan to manage diabetes and patient agrees:   -Based on her response to metformin therapy , she will not need additional treatment. -She still takes her regular strength metformin, advised to lower Metformin to 500 mg p.o. twice daily after that she will be switched to Metformin  1000 mg ER daily after breakfast.     -  She has several choices for treatment if she loses control of diabetes by next visit. - Patient specific target  A1c;  LDL, HDL, Triglycerides, and  Waist Circumference were discussed in detail.  2) BP/HTN:  Her blood pressure is controlled to target.  She is advised to continue her current blood pressure medications including lisinopril 10 mg p.o. daily.     3) Lipids/HPL: Her fasting lipid panel shows LDL increasing to 100.  She tolerates Crestor, advised to continue Crestor 10 mg p.o. nightly.   4)  Weight/Diet: Her BMI is 36-patient is losing weight.  She is still a candidate for more weight loss.  CDE Consult has been  initiated , exercise, and detailed carbohydrates information provided.  5) Chronic Care/Health Maintenance:  -she  is on ACEI medications and  is encouraged to continue to follow up with Ophthalmology, Dentist,  Podiatrist at least yearly or according to recommendations, and advised to take away from smoking. I have recommended yearly flu vaccine and pneumonia vaccination at least every 5 years; moderate intensity exercise for up to 150 minutes weekly; and  sleep for at least 7 hours a day.  - I advised patient to maintain close follow up with Patient, No Pcp Per for primary care needs.  - Time spent on this patient care encounter:  35 min, of which > 50% was spent in  counseling and the rest reviewing her blood glucose logs , discussing her hypoglycemia and hyperglycemia episodes, reviewing her current and  previous labs / studies  ( including abstraction from other facilities) and medications  doses and developing a  long term  treatment plan and documenting her care.   Please refer to Patient Instructions for Blood Glucose Monitoring and Insulin/Medications Dosing Guide"  in media tab for additional information. Please  also refer to " Patient Self Inventory" in the Media  tab for reviewed elements of pertinent patient history.  [name] participated in the discussions, expressed understanding, and voiced agreement with the above plans.  All questions were answered to her satisfaction. she is encouraged to contact clinic should she have any questions or concerns prior to her return visit.    Follow up plan: - Return in about 4 months (around 11/27/2019) for Follow up with Pre-visit Labs, [place] A1c in Office.  Glade Lloyd, MD [facility] [address] [place], [place] [address] Phone: [phone]  Fax: [phone]    07/27/2019, 9:29 AM  This note was partially dictated with voice recognition software. Similar sounding words can be transcribed inadequately or may not  be corrected upon review.

## 2019-07-27 NOTE — Patient Instructions (Signed)

## 2019-09-17 LAB — HM DIABETES EYE EXAM

## 2019-10-14 ENCOUNTER — Other Ambulatory Visit: Payer: Self-pay | Admitting: Oncology

## 2019-10-18 ENCOUNTER — Other Ambulatory Visit: Payer: Self-pay | Admitting: "Endocrinology

## 2019-11-11 ENCOUNTER — Telehealth: Payer: Self-pay | Admitting: "Endocrinology

## 2019-11-19 ENCOUNTER — Other Ambulatory Visit: Payer: Self-pay

## 2019-11-19 DIAGNOSIS — E119 Type 2 diabetes mellitus without complications: Secondary | ICD-10-CM

## 2019-11-19 MED ORDER — METFORMIN HCL ER (OSM) 1000 MG PO TB24: 1000.0000 mg | ORAL_TABLET | Freq: Every day | ORAL | 1 refills | Status: DC

## 2019-11-19 NOTE — Telephone Encounter (Signed)
Returned call to pt, advised the correct script was sent in for her.

## 2019-11-19 NOTE — Telephone Encounter (Signed)
Pt called and does not understand why this medicine was denied. Please contact at [phone]

## 2019-11-30 ENCOUNTER — Ambulatory Visit: Payer: BC Managed Care – PPO | Admitting: "Endocrinology

## 2019-12-01 ENCOUNTER — Other Ambulatory Visit: Payer: Self-pay | Admitting: Oncology

## 2019-12-01 DIAGNOSIS — Z1231 Encounter for screening mammogram for malignant neoplasm of breast: Secondary | ICD-10-CM

## 2019-12-03 LAB — COMPLETE METABOLIC PANEL WITH GFR
AG Ratio: 2 (calc) (ref 1.0–2.5)
ALT: 25 U/L (ref 6–29)
AST: 18 U/L (ref 10–30)
Albumin: 4.3 g/dL (ref 3.6–5.1)
Alkaline phosphatase (APISO): 43 U/L (ref 31–125)
BUN: 13 mg/dL (ref 7–25)
CO2: 29 mmol/L (ref 20–32)
Calcium: 9.2 mg/dL (ref 8.6–10.2)
Chloride: 105 mmol/L (ref 98–110)
Creat: 0.68 mg/dL (ref 0.50–1.10)
GFR, Est African American: 123 mL/min/{1.73_m2} (ref >=60)
GFR, Est Non African American: 106 mL/min/{1.73_m2} (ref >=60)
Globulin: 2.2 g/dL (calc) (ref 1.9–3.7)
Glucose, Bld: 124 mg/dL — ABNORMAL HIGH (ref 65–99)
Potassium: 4.4 mmol/L (ref 3.5–5.3)
Sodium: 140 mmol/L (ref 135–146)
Total Bilirubin: 0.8 mg/dL (ref 0.2–1.2)
Total Protein: 6.5 g/dL (ref 6.1–8.1)

## 2019-12-03 LAB — T4, FREE: Free T4: 1.2 ng/dL (ref 0.8–1.8)

## 2019-12-03 LAB — MICROALBUMIN / CREATININE URINE RATIO
Creatinine, Urine: 140 mg/dL (ref 20–275)
Microalb Creat Ratio: 23 mcg/mg creat (ref <30)
Microalb, Ur: 3.2 mg/dL

## 2019-12-03 LAB — LIPID PANEL
Cholesterol: 158 mg/dL (ref <200)
HDL: 40 mg/dL — ABNORMAL LOW (ref >=50)
LDL Cholesterol (Calc): 91 mg/dL (calc)
Non-HDL Cholesterol (Calc): 118 mg/dL (calc) (ref <130)
Total CHOL/HDL Ratio: 4 (calc) (ref <5.0)
Triglycerides: 171 mg/dL — ABNORMAL HIGH (ref <150)

## 2019-12-03 LAB — TSH: TSH: 1.13 mIU/L

## 2019-12-09 ENCOUNTER — Ambulatory Visit: Payer: BC Managed Care – PPO | Admitting: "Endocrinology

## 2019-12-09 ENCOUNTER — Encounter: Payer: Self-pay | Admitting: "Endocrinology

## 2019-12-09 ENCOUNTER — Other Ambulatory Visit: Payer: Self-pay

## 2019-12-09 VITALS — BP 128/83 | HR 84 | Ht 68.0 in | Wt 223.2 lb

## 2019-12-09 DIAGNOSIS — E119 Type 2 diabetes mellitus without complications: Secondary | ICD-10-CM | POA: Diagnosis not present

## 2019-12-09 DIAGNOSIS — I10 Essential (primary) hypertension: Secondary | ICD-10-CM

## 2019-12-09 DIAGNOSIS — E782 Mixed hyperlipidemia: Secondary | ICD-10-CM | POA: Diagnosis not present

## 2019-12-09 LAB — POCT GLYCOSYLATED HEMOGLOBIN (HGB A1C): Hemoglobin A1C: 5.6 % (ref 4.0–5.6)

## 2019-12-09 MED ORDER — METFORMIN HCL ER (OSM) 500 MG PO TB24: 500.0000 mg | ORAL_TABLET | Freq: Every day | ORAL | 1 refills | Status: DC

## 2019-12-09 MED ORDER — METFORMIN HCL ER (OSM) 500 MG PO TB24: 1000.0000 mg | ORAL_TABLET | Freq: Every day | ORAL | 1 refills | Status: DC

## 2019-12-09 NOTE — Progress Notes (Signed)
12/09/2019, 2:15 PM Endocrinology follow-up note   Subjective:    Patient ID: [name], female    DOB: October 03, 1975.  [name] is being seen in follow-up  for management of currently uncontrolled symptomatic type 2 diabetes, hypertension, hyperlipidemia.  PCP: Patient, No Pcp Per.   Past Medical History:  Diagnosis Date  . Anxiety   . Breast cancer ([place]) 10/16/12   left, lower medial, 8 o'clock  . DVT (deep venous thrombosis) ([place]) 02/2013   RT LEG   . Family history of anesthesia complication    pt's mother has hx. of post-op N/V  . History of radiation therapy 07/06/13-08/20/13   left breast/  . History of transfusion   . Hypertension    under control with med., has been on med. since 09/2012  . Pain in the wrist    BILATERAL  . Personal history of chemotherapy 2014  . Personal history of radiation therapy 2014   Past Surgical History:  Procedure Laterality Date  . BREAST BIOPSY Right 11/2017   benign  . BREAST LUMPECTOMY Left 2014  . BREAST LUMPECTOMY WITH NEEDLE LOCALIZATION AND AXILLARY SENTINEL LYMPH NODE BX Left 12/10/2012   Procedure: BREAST LUMPECTOMY WITH NEEDLE LOCALIZATION AND AXILLARY SENTINEL LYMPH NODE BX;  Surgeon: Merrie Roof, MD;  Location: [facility];  Service: General;  Laterality: Left;  Needle loc BCG 7:30  nuc med 9:30    . CESAREAN [place]  . CHOLECYSTECTOMY  1999  . LEEP N/A 01/26/2014   Procedure: LOOP ELECTROSURGICAL EXCISION PROCEDURE (LEEP);  Surgeon: Lucita Lora. Alycia Rossetti, MD;  Location: [facility];  Service: Gynecology;  Laterality: N/A;  . OPEN REDUCTION INTERNAL FIXATION (ORIF) TIBIA/FIBULA FRACTURE Right   . PORT-A-CATH REMOVAL    . PORTACATH PLACEMENT Right 01/09/2013   Procedure: INSERTION PORT-A-CATH;  Surgeon: Merrie Roof, MD;  Location: [facility];  Service: General;  Laterality: Right;  . ROBOTIC ASSISTED BILATERAL SALPINGO  OOPHERECTOMY Bilateral 01/26/2014   Procedure: ROBOTIC ASSISTED BILATERAL SALPINGO OOPHORECTOMY;  Surgeon: Imagene Gurney A. Alycia Rossetti, MD;  Location: [facility];  Service: Gynecology;  Laterality: Bilateral;  . TONSILLECTOMY AND ADENOIDECTOMY     as a teenager   Social History   Socioeconomic History  . Marital status: Divorced    Spouse name: Not on file  . Number of children: Not on file  . Years of education: Not on file  . Highest education level: Not on file  Occupational History  . Not on file  Tobacco Use  . Smoking status: Former Smoker    Quit date: 12/03/2012    Years since quitting: 7.0  . Smokeless tobacco: Never Used  Vaping Use  . Vaping Use: Never used  Substance and Sexual Activity  . Alcohol use: Yes    Comment: occasionally  . Drug use: No  . Sexual activity: Yes    Birth control/protection: Condom    Comment: menarche age 21, P109 @ age 63,  last menstrual cycle 09/29/12  Other Topics Concern  . Not on file  Social History Narrative  . Not on file   Social Determinants of Health  Financial Resource Strain:   . Difficulty of Paying Living Expenses: Not on file  Food Insecurity:   . Worried About Charity fundraiser in the Last Year: Not on file  . Ran Out of Food in the Last Year: Not on file  Transportation Needs:   . Lack of Transportation (Medical): Not on file  . Lack of Transportation (Non-Medical): Not on file  Physical Activity:   . Days of Exercise per Week: Not on file  . Minutes of Exercise per Session: Not on file  Stress:   . Feeling of Stress : Not on file  Social Connections:   . Frequency of Communication with Friends and Family: Not on file  . Frequency of Social Gatherings with Friends and Family: Not on file  . Attends Religious Services: Not on file  . Active Member of Clubs or Organizations: Not on file  . Attends Archivist Meetings: Not on file  . Marital Status: Not on file   Outpatient Encounter Medications as of 12/09/2019   Medication Sig  . b complex vitamins tablet Take 1 tablet by mouth every morning.  Marland Kitchen BIOTIN PO Take 1 tablet by mouth every morning.  . Cholecalciferol 2000 units CAPS Take 1 capsule by mouth daily.  Marland Kitchen letrozole (FEMARA) 2.5 MG tablet Take 1 tablet (2.5 mg total) by mouth daily.  Marland Kitchen lisinopril (ZESTRIL) 10 MG tablet TAKE 1 TABLET BY MOUTH EVERY DAY IN THE MORNING  . metformin (FORTAMET) 500 MG (OSM) 24 hr tablet Take 1 tablet (500 mg total) by mouth daily with breakfast.  . [DISCONTINUED] metformin (FORTAMET) 1000 MG (OSM) 24 hr tablet Take 1 tablet (1,000 mg total) by mouth daily after breakfast.  . [DISCONTINUED] metformin (FORTAMET) 500 MG (OSM) 24 hr tablet Take 2 tablets (1,000 mg total) by mouth daily after breakfast.   No facility-administered encounter medications on file as of 12/09/2019.    ALLERGIES: Allergies  Allergen Reactions  . Flu Virus Vaccine Swelling  . Amoxicillin Other (See Comments)    States "it does not work"    VACCINATION STATUS:  There is no immunization history on file for this patient.  Diabetes She presents for her follow-up diabetic visit. She has type 2 diabetes mellitus. Onset time: She was diagnosed at approximate age of 30 years. This is following exposure to high-dose steroids related to treatment for breast cancer. Her disease course has been improving. There are no hypoglycemic associated symptoms. Pertinent negatives for hypoglycemia include no confusion, headaches, pallor or seizures. Pertinent negatives for diabetes include no chest pain, no fatigue, no polydipsia, no polyphagia and no polyuria. There are no hypoglycemic complications. Symptoms are improving. There are no diabetic complications. Risk factors for coronary artery disease include diabetes mellitus, hypertension, obesity, tobacco exposure and sedentary lifestyle. Current diabetic treatments: She is taking Metformin 500 mg p.o. twice daily. Her weight is decreasing steadily. She is  following a diabetic diet. When asked about meal planning, she reported none. She has had a previous visit with a dietitian. She participates in exercise intermittently. Her home blood glucose trend is decreasing steadily. (She presents with controlled glycemic profile.  Point-of-care A1c 5.8% improving from 7.3%.) An ACE inhibitor/angiotensin II receptor blocker is being taken. She does not see a podiatrist.Eye exam is not current.  Hypertension This is a chronic problem. The current episode started more than 1 year ago. The problem is controlled. Pertinent negatives include no chest pain, headaches, palpitations or shortness of breath. Risk factors for coronary  artery disease include diabetes mellitus, obesity, sedentary lifestyle, smoking/tobacco exposure and family history. Past treatments include ACE inhibitors.     Review of systems  Constitutional: + Progressive weight loss,  current  Body mass index is 33.94 kg/m. , no fatigue, no subjective hyperthermia, no subjective hypothermia Eyes: no blurry vision, no xerophthalmia ENT: no sore throat, no nodules palpated in throat, no dysphagia/odynophagia, no hoarseness Cardiovascular: no Chest Pain, no Shortness of Breath, no palpitations, no leg swelling Respiratory: no cough, no shortness of breath Gastrointestinal: no Nausea/Vomiting/Diarhhea Musculoskeletal: no muscle/joint aches Skin: no rashes, no hyperemia Neurological: no tremors, no numbness, no tingling, no dizziness Psychiatric: no depression, no anxiety   Objective:    BP 128/83   Pulse 84   Ht 5\' 8"  (1.727 m)   Wt 223 lb 3.2 oz (101.2 kg)   BMI 33.94 kg/m   Wt Readings from Last 3 Encounters:  12/09/19 223 lb 3.2 oz (101.2 kg)  07/27/19 237 lb 3.2 oz (107.6 kg)  01/27/19 248 lb 9.6 oz (112.8 kg)      Physical Exam- Limited  Constitutional:  Body mass index is 33.94 kg/m. , not in acute distress, normal state of mind Eyes:  EOMI, no exophthalmos Neck:  Supple Thyroid: No gross goiter Respiratory: Adequate breathing efforts Musculoskeletal: no gross deformities, strength intact in all four extremities, no gross restriction of joint movements Skin:  no rashes, no hyperemia Neurological: no tremor with outstretched hands    Recent Results (from the past 2160 hour(s))  HM DIABETES EYE EXAM     Status: None   Collection Time: 09/17/19 12:00 AM  Result Value Ref Range   HM Diabetic Eye Exam No Retinopathy No Retinopathy  COMPLETE METABOLIC PANEL WITH GFR     Status: Abnormal   Collection Time: 12/02/19  9:16 AM  Result Value Ref Range   Glucose, Bld 124 (H) 65 - 99 mg/dL    Comment: For someone without known diabetes, a glucose value between 100 and 125 mg/dL is consistent with prediabetes and should be confirmed with a follow-up test. . .            Fasting reference interval .    BUN 13 7 - 25 mg/dL   Creat 0.68 0.50 - 1.10 mg/dL   GFR, Est Non African American 106 > OR = 60 mL/min/1.59m2   GFR, Est African American 123 > OR = 60 mL/min/1.63m2   BUN/Creatinine Ratio NOT APPLICABLE 6 - 22 (calc)   Sodium 140 135 - 146 mmol/L   Potassium 4.4 3.5 - 5.3 mmol/L   Chloride 105 98 - 110 mmol/L   CO2 29 20 - 32 mmol/L   Calcium 9.2 8.6 - 10.2 mg/dL   Total Protein 6.5 6.1 - 8.1 g/dL   Albumin 4.3 3.6 - 5.1 g/dL   Globulin 2.2 1.9 - 3.7 g/dL (calc)   AG Ratio 2.0 1.0 - 2.5 (calc)   Total Bilirubin 0.8 0.2 - 1.2 mg/dL   Alkaline phosphatase (APISO) 43 31 - 125 U/L   AST 18 10 - 30 U/L   ALT 25 6 - 29 U/L  TSH     Status: None   Collection Time: 12/02/19  9:16 AM  Result Value Ref Range   TSH 1.13 mIU/L    Comment:           Reference Range .           > or = 20 Years  0.40-4.50 .  Pregnancy Ranges           First trimester    0.26-2.66           Second trimester   0.55-2.73           Third trimester    0.43-2.91   T4, free     Status: None   Collection Time: 12/02/19  9:16 AM  Result Value Ref Range    Free T4 1.2 0.8 - 1.8 ng/dL  Microalbumin / creatinine urine ratio     Status: None   Collection Time: 12/02/19  9:16 AM  Result Value Ref Range   Creatinine, Urine 140 20 - 275 mg/dL   Microalb, Ur 3.2 mg/dL    Comment: Reference Range Not established    Microalb Creat Ratio 23 <30 mcg/mg creat    Comment: . The ADA defines abnormalities in albumin excretion as follows: Marland Kitchen Albuminuria Category        Result (mcg/mg creatinine) . Normal to Mildly increased   <30 Moderately increased         30-299  Severely increased           > OR = 300 . The ADA recommends that at least two of three specimens collected within a 3-6 month period be abnormal before considering a patient to be within a diagnostic category.   Lipid panel     Status: Abnormal   Collection Time: 12/02/19  9:16 AM  Result Value Ref Range   Cholesterol 158 <200 mg/dL   HDL 40 (L) > OR = 50 mg/dL   Triglycerides 171 (H) <150 mg/dL   LDL Cholesterol (Calc) 91 mg/dL (calc)    Comment: Reference range: <100 . Desirable range <100 mg/dL for primary prevention;   <70 mg/dL for patients with CHD or diabetic patients  with > or = 2 CHD risk factors. Marland Kitchen LDL-C is now calculated using the Martin-Hopkins  calculation, which is a validated novel method providing  better accuracy than the Friedewald equation in the  estimation of LDL-C.  [name] et al. Annamaria Helling. 0240;973(53): 2061-2068  (http://education.[facility].com/faq/FAQ164)    Total CHOL/HDL Ratio 4.0 <5.0 (calc)   Non-HDL Cholesterol (Calc) 118 <130 mg/dL (calc)    Comment: For patients with diabetes plus 1 major ASCVD risk  factor, treating to a non-HDL-C goal of <100 mg/dL  (LDL-C of <70 mg/dL) is considered a therapeutic  option.   HgB A1c     Status: None   Collection Time: 12/09/19  9:20 AM  Result Value Ref Range   Hemoglobin A1C 5.6 4.0 - 5.6 %   HbA1c POC (<> result, manual entry)     HbA1c, POC (prediabetic range)     HbA1c, POC (controlled  diabetic range)     Lipid Panel     Component Value Date/Time   CHOL 158 12/02/2019 0916   TRIG 171 (H) 12/02/2019 0916   HDL 40 (L) 12/02/2019 0916   CHOLHDL 4.0 12/02/2019 0916   LDLCALC 91 12/02/2019 0916     Assessment & Plan:   1. Uncontrolled type 2 diabetes mellitus with hyperglycemia ([place])  - [name] has currently controlled asymptomatic type 2 DM since  44 years of age.  She presents with controlled glycemic profile.  Her point-of-care A1c is 5.6%, progressively improving from 7.3%.    - Recent labs reviewed with her.  - She does not report any gross competitions from her diabetes, however [name] remains at a high risk for more acute  and chronic complications which include CAD, CVA, CKD, retinopathy, and neuropathy. These are all discussed in detail with the patient.  - I have counseled her on diet management and weight loss, by adopting a carbohydrate restricted/protein rich diet.  - she have made significant changes in her lifestyle, lost 35 pounds so far.      - she  admits there is a room for improvement in her diet and drink choices. -  Suggestion is made for her to avoid simple carbohydrates  from her diet including Cakes, Sweet Desserts / Pastries, Ice Cream, Soda (diet and regular), Sweet Tea, Candies, Chips, Cookies, Sweet Pastries,  Store Bought Juices, Alcohol in Excess of  1-2 drinks a day, Artificial Sweeteners, Coffee Creamer, and "Sugar-free" Products. This will help patient to have stable blood glucose profile and potentially avoid unintended weight gain.   - I encouraged her to switch to  unprocessed or minimally processed complex starch and increased protein intake (animal or plant source), fruits, and vegetables.  - she is advised to stick to a routine mealtimes to eat 3 meals  a day and avoid unnecessary snacks ( to snack only to correct hypoglycemia).   - I have approached her with the following individualized plan to manage diabetes  and patient agrees:   -Based on her response to metformin therapy , she will not need additional treatment. -She is advised to finish up her current regimen of Metformin 1000 mg ER daily after breakfast, and will be switched to Metformin 500 mg ER daily after breakfast.        -She has several choices for treatment if she loses control of diabetes by next visit. - Patient specific target  A1c;  LDL, HDL, Triglycerides, and  Waist Circumference were discussed in detail.  2) BP/HTN:  -Her blood pressure is controlled to target.  She is advised to continue her current blood pressure medications including lisinopril 10 mg p.o. daily.     3) Lipids/HPL: Her fasting lipid panel shows LDL increasing to 91.  She did not continue on Crestor.    4)  Weight/Diet: Her BMI is 33.9-patient continues to lose weight appropriately.  She is still a candidate for more weight loss.  CDE Consult has been  initiated , exercise, and detailed carbohydrates information provided.  5) Chronic Care/Health Maintenance:  -she  is on ACEI medications and  is encouraged to continue to follow up with Ophthalmology, Dentist,  Podiatrist at least yearly or according to recommendations, and advised to take away from smoking. I have recommended yearly flu vaccine and pneumonia vaccination at least every 5 years; moderate intensity exercise for up to 150 minutes weekly; and  sleep for at least 7 hours a day.  - I advised patient to maintain close follow up with Patient, No Pcp Per for primary care needs.  - Time spent on this patient care encounter:  35 min, of which > 50% was spent in  counseling and the rest reviewing her blood glucose logs , discussing her hypoglycemia and hyperglycemia episodes, reviewing her current and  previous labs / studies  ( including abstraction from other facilities) and medications  doses and developing a  long term treatment plan and documenting her care.   Please refer to Patient Instructions  for Blood Glucose Monitoring and Insulin/Medications Dosing Guide"  in media tab for additional information. Please  also refer to " Patient Self Inventory" in the Media  tab for reviewed elements of pertinent patient history.  [name] participated in the discussions, expressed understanding, and voiced agreement with the above plans.  All questions were answered to her satisfaction. she is encouraged to contact clinic should she have any questions or concerns prior to her return visit.   Follow up plan: - Return in about 6 months (around 06/07/2020) for F/U with Pre-visit Labs, NV A1c in Office.  Theresa Lloyd, MD [facility] [address] [place], [place] [address] Phone: [phone]  Fax: [phone]    12/09/2019, 2:15 PM  This note was partially dictated with voice recognition software. Similar sounding words can be transcribed inadequately or may not  be corrected upon review.

## 2019-12-09 NOTE — Patient Instructions (Signed)

## 2020-01-08 ENCOUNTER — Other Ambulatory Visit: Payer: Self-pay

## 2020-01-08 ENCOUNTER — Ambulatory Visit
Admission: RE | Admit: 2020-01-08 | Discharge: 2020-01-08 | Disposition: A | Discharge status: Home / Self Care | Payer: BC Managed Care – PPO | Source: Ambulatory Visit | Attending: Oncology | Admitting: Oncology

## 2020-01-08 DIAGNOSIS — Z1231 Encounter for screening mammogram for malignant neoplasm of breast: Secondary | ICD-10-CM

## 2020-01-16 ENCOUNTER — Other Ambulatory Visit: Payer: Self-pay | Admitting: Oncology

## 2020-01-27 ENCOUNTER — Inpatient Hospital Stay: Payer: BC Managed Care – PPO

## 2020-01-27 ENCOUNTER — Inpatient Hospital Stay: Payer: BC Managed Care – PPO | Admitting: Oncology

## 2020-02-01 ENCOUNTER — Other Ambulatory Visit: Payer: Self-pay | Admitting: Oncology

## 2020-02-19 ENCOUNTER — Inpatient Hospital Stay (HOSPITAL_BASED_OUTPATIENT_CLINIC_OR_DEPARTMENT_OTHER): Payer: BC Managed Care – PPO | Admitting: Adult Health

## 2020-02-19 ENCOUNTER — Ambulatory Visit: Payer: BC Managed Care – PPO | Admitting: Oncology

## 2020-02-19 ENCOUNTER — Other Ambulatory Visit: Payer: BC Managed Care – PPO

## 2020-02-19 ENCOUNTER — Other Ambulatory Visit: Payer: Self-pay

## 2020-02-19 ENCOUNTER — Inpatient Hospital Stay: Payer: BC Managed Care – PPO | Attending: Oncology

## 2020-02-19 ENCOUNTER — Inpatient Hospital Stay: Payer: BC Managed Care – PPO

## 2020-02-19 ENCOUNTER — Ambulatory Visit: Payer: BC Managed Care – PPO

## 2020-02-19 ENCOUNTER — Encounter: Payer: Self-pay | Admitting: Adult Health

## 2020-02-19 VITALS — BP 140/84 | HR 67 | Temp 97.6°F | Resp 17 | Ht 68.0 in | Wt 223.0 lb

## 2020-02-19 DIAGNOSIS — Z7984 Long term (current) use of oral hypoglycemic drugs: Secondary | ICD-10-CM | POA: Insufficient documentation

## 2020-02-19 DIAGNOSIS — Z87891 Personal history of nicotine dependence: Secondary | ICD-10-CM | POA: Diagnosis not present

## 2020-02-19 DIAGNOSIS — Z86718 Personal history of other venous thrombosis and embolism: Secondary | ICD-10-CM | POA: Insufficient documentation

## 2020-02-19 DIAGNOSIS — C50312 Malignant neoplasm of lower-inner quadrant of left female breast: Secondary | ICD-10-CM | POA: Diagnosis present

## 2020-02-19 DIAGNOSIS — Z923 Personal history of irradiation: Secondary | ICD-10-CM | POA: Insufficient documentation

## 2020-02-19 DIAGNOSIS — Z79899 Other long term (current) drug therapy: Secondary | ICD-10-CM | POA: Insufficient documentation

## 2020-02-19 DIAGNOSIS — I10 Essential (primary) hypertension: Secondary | ICD-10-CM | POA: Insufficient documentation

## 2020-02-19 DIAGNOSIS — M810 Age-related osteoporosis without current pathological fracture: Secondary | ICD-10-CM | POA: Diagnosis not present

## 2020-02-19 DIAGNOSIS — Z801 Family history of malignant neoplasm of trachea, bronchus and lung: Secondary | ICD-10-CM | POA: Diagnosis not present

## 2020-02-19 DIAGNOSIS — Z170 Estrogen receptor positive status [ER+]: Secondary | ICD-10-CM | POA: Diagnosis not present

## 2020-02-19 DIAGNOSIS — Z79811 Long term (current) use of aromatase inhibitors: Secondary | ICD-10-CM | POA: Diagnosis not present

## 2020-02-19 DIAGNOSIS — Z9221 Personal history of antineoplastic chemotherapy: Secondary | ICD-10-CM | POA: Diagnosis not present

## 2020-02-19 DIAGNOSIS — Z803 Family history of malignant neoplasm of breast: Secondary | ICD-10-CM | POA: Diagnosis not present

## 2020-02-19 LAB — COMPREHENSIVE METABOLIC PANEL
ALT: 16 U/L (ref 0–44)
AST: 14 U/L — ABNORMAL LOW (ref 15–41)
Albumin: 3.8 g/dL (ref 3.5–5.0)
Alkaline Phosphatase: 72 U/L (ref 38–126)
Anion gap: 9 (ref 5–15)
BUN: 12 mg/dL (ref 6–20)
CO2: 28 mmol/L (ref 22–32)
Calcium: 9.8 mg/dL (ref 8.9–10.3)
Chloride: 103 mmol/L (ref 98–111)
Creatinine, Ser: 0.77 mg/dL (ref 0.44–1.00)
GFR, Estimated: >60 mL/min (ref >60)
Glucose, Bld: 171 mg/dL — ABNORMAL HIGH (ref 70–99)
Potassium: 4 mmol/L (ref 3.5–5.1)
Sodium: 140 mmol/L (ref 135–145)
Total Bilirubin: 0.9 mg/dL (ref 0.3–1.2)
Total Protein: 6.7 g/dL (ref 6.5–8.1)

## 2020-02-19 LAB — CBC WITH DIFFERENTIAL/PLATELET
Abs Immature Granulocytes: 0.03 10*3/uL (ref 0.00–0.07)
Basophils Absolute: 0 10*3/uL (ref 0.0–0.1)
Basophils Relative: 1 %
Eosinophils Absolute: 0.2 10*3/uL (ref 0.0–0.5)
Eosinophils Relative: 2 %
HCT: 40.1 % (ref 36.0–46.0)
Hemoglobin: 13.6 g/dL (ref 12.0–15.0)
Immature Granulocytes: 0 %
Lymphocytes Relative: 29 %
Lymphs Abs: 2.5 10*3/uL (ref 0.7–4.0)
MCH: 31.8 pg (ref 26.0–34.0)
MCHC: 33.9 g/dL (ref 30.0–36.0)
MCV: 93.7 fL (ref 80.0–100.0)
Monocytes Absolute: 0.5 10*3/uL (ref 0.1–1.0)
Monocytes Relative: 6 %
Neutro Abs: 5.4 10*3/uL (ref 1.7–7.7)
Neutrophils Relative %: 62 %
Platelets: 247 10*3/uL (ref 150–400)
RBC: 4.28 MIL/uL (ref 3.87–5.11)
RDW: 12.9 % (ref 11.5–15.5)
WBC: 8.7 10*3/uL (ref 4.0–10.5)
nRBC: 0 % (ref 0.0–0.2)

## 2020-02-19 MED ORDER — DENOSUMAB 60 MG/ML ~~LOC~~ SOSY
PREFILLED_SYRINGE | SUBCUTANEOUS | Status: AC
  Filled 2020-02-19: qty 1

## 2020-02-19 MED ORDER — DENOSUMAB 60 MG/ML ~~LOC~~ SOSY
60.0000 mg | PREFILLED_SYRINGE | Freq: Once | SUBCUTANEOUS | Status: AC
  Administered 2020-02-19: 60 mg via SUBCUTANEOUS

## 2020-02-19 NOTE — Patient Instructions (Signed)

## 2020-02-19 NOTE — Patient Instructions (Signed)

## 2020-02-19 NOTE — Progress Notes (Signed)
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   She continues on letrozole, and tolerates this well with Theresa concerns.  She is due to complete 7 years in 08/2020.   Her most recent bone density scan completed on 12/17/2017 showed a T-score of -2.5. She continues on denosumab, given every 6 months with good tolerance.  She is due for repeat bone density test.  Since her last visit, she underwent a bilateral screening mammogram on 01/12/2020 that showed Theresa evidence of malignancy and breast density category C.     REVIEW OF SYSTEMS:  [name] is doing quite well today.  She has Theresa new concerns.  She is exercising regularly and working on losing weight.  A detailed ROS was otherwise non contributory.    BREAST CANCER HISTORY: From Dr [name] original intake nodes:  A screening mammogram in [place], [place] on 09/23/2012 she was felt to have a new mass within the left breast. An ultrasound on 10/06/2012 showed a 1.6 x 1.3 x 1.3 cm mass at the 8 o'clock position 7 cm from the nipple. At the 12 o'clock position behind and nipple was a circumscribed oval shaped 2 x 1.8 x 1.2 cm lesion perhaps representing a fibroadenoma. 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 The carcinoma was grade 2-3, estrogen receptor 80% positive, progesterone receptor 80% positive,  Ki-67 25%, and HER-2/neu negative. Bilateral breast MRI on 10/22/2012 showed biopsy proven carcinoma in the left lower inner quadrant identified and measures approximately 1.9 x 1.9 x 1.8 cm. Well-defined nodule in the right upper outer quadrant posteriorly consistent with a fibroadenoma. Probable liver cysts.There were multiple scattered nonspecific foci in each breast. Theresa other suspicious abnormality was identified. Theresa abnormal appearing lymph nodes.   The patient's subsequent history is as detailed below   PAST MEDICAL HISTORY: Past Medical History:  Diagnosis Date  . Anxiety   . Breast cancer ([facility]) 10/16/12   left, lower medial, 8 o'clock  . DVT (deep venous thrombosis) ([facility]) 02/2013   RT LEG   . Family history of anesthesia complication    pt's mother has hx. of post-op N/V  . History of radiation therapy 07/06/13-08/20/13   left breast/  . History of transfusion   . Hypertension    under control with med., has been on med. since 09/2012  . Pain in the wrist    BILATERAL  . Personal history of chemotherapy 2014  . Personal history of radiation therapy 2014    PAST SURGICAL HISTORY: Past Surgical History:  Procedure Laterality Date  . BREAST BIOPSY Right 11/2017   benign  . BREAST LUMPECTOMY Left 2014  . BREAST LUMPECTOMY WITH NEEDLE LOCALIZATION AND AXILLARY SENTINEL LYMPH NODE BX Left 12/10/2012   Procedure: BREAST LUMPECTOMY WITH NEEDLE LOCALIZATION AND AXILLARY SENTINEL LYMPH NODE BX;  Surgeon: Theresa Roof, MD;  Location: [facility];  Service: General;  Laterality: Left;  Needle loc BCG 7:30  nuc med 9:30    .  CESAREAN SECTION  1999  . CHOLECYSTECTOMY  1999  . LEEP N/A 01/26/2014   Procedure: LOOP ELECTROSURGICAL EXCISION PROCEDURE (LEEP);  Surgeon: Theresa Lora. Alycia Rossetti, MD;  Location: [facility];  Service: Gynecology;  Laterality: N/A;  . OPEN REDUCTION INTERNAL FIXATION (ORIF) TIBIA/FIBULA FRACTURE Right   . PORT-A-CATH REMOVAL    . PORTACATH PLACEMENT Right 01/09/2013    Procedure: INSERTION PORT-A-CATH;  Surgeon: Theresa Roof, MD;  Location: [facility];  Service: General;  Laterality: Right;  . ROBOTIC ASSISTED BILATERAL SALPINGO OOPHERECTOMY Bilateral 01/26/2014   Procedure: ROBOTIC ASSISTED BILATERAL SALPINGO OOPHORECTOMY;  Surgeon: Theresa Gurney A. Alycia Rossetti, MD;  Location: [facility];  Service: Gynecology;  Laterality: Bilateral;  . TONSILLECTOMY AND ADENOIDECTOMY     as a teenager    FAMILY HISTORY Family History  Problem Relation Age of Onset  . Anesthesia problems Mother        post-op N/V  . Breast cancer Maternal Grandmother        50-60s  . 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 She is divorced. Her daughter, [name] is now in her third year at [employer] with a minor in art.    ADVANCED DIRECTIVES: (Updated 01/12/2014) Not in place. At her 10/20/2013 visit the patient was given the appropriate documents to complete and notarize at her discretion. She tells me she intends to name her mother, [name], as healthcare power of attorney. She can be reached at [phone]   HEALTH MAINTENANCE: Social History   Tobacco Use  . Smoking status: Former Smoker    Quit date: 12/03/2012    Years since quitting: 7.2  . Smokeless tobacco: Never Used  Vaping Use  . Vaping Use: Never used  Substance Use Topics  . Alcohol use: Yes    Comment: occasionally  . Drug use: Theresa    Colonoscopy: n/a Pap Smear: 03/2013 Eye Exam: 08/2012 Vitamin D  Level: pending Lipid Panel: not  Checked recently   Allergies  Allergen Reactions  . Flu Virus Vaccine Swelling  . Amoxicillin Other (See Comments)    States "it does not work"    Current Outpatient Medications  Medication Sig Dispense Refill  . b complex vitamins tablet Take 1 tablet by mouth every morning.    Marland Kitchen BIOTIN PO Take 1 tablet by mouth every morning.    . Cholecalciferol 2000 units CAPS Take 1 capsule by mouth daily.    Marland Kitchen letrozole (FEMARA) 2.5 MG tablet Take 1 tablet (2.5 mg total) by mouth daily. 90 tablet 4  . lisinopril (ZESTRIL) 10 MG tablet TAKE 1 TABLET BY MOUTH EVERY DAY IN THE MORNING 90 tablet 0  . metformin (FORTAMET) 500 MG (OSM) 24 hr tablet Take 1 tablet (500 mg total) by mouth daily with breakfast. 90 tablet 1   Theresa current facility-administered medications for this visit.    OBJECTIVE:  Vitals:   02/19/20 0837  BP: 140/84  Pulse: 67  Resp: 17  Temp: 97.6 F (36.4 C)  SpO2: 100%  Body mass index is 33.91 kg/m.       ECOG FS:0 - Asymptomatic  GENERAL: Patient is a well appearing female in Theresa acute distress HEENT:  Sclerae anicteric.  Mask in place.. Neck is supple.  NODES:  Theresa cervical, supraclavicular, or axillary lymphadenopathy palpated.  BREAST EXAM:  Right breast benign, left breast s/p lumpectomy and radiation, Theresa sign of local recurrence LUNGS:  Clear to auscultation bilaterally.  Theresa wheezes or rhonchi. HEART:  Regular rate and rhythm. Theresa murmur appreciated. ABDOMEN:  Soft, nontender.  Positive, normoactive bowel sounds. Theresa organomegaly palpated. MSK:  Theresa focal spinal tenderness to palpation. Full range of motion bilaterally in the upper extremities. EXTREMITIES:  Theresa peripheral edema.   SKIN:  Clear with Theresa obvious rashes or skin changes. Theresa nail dyscrasia. NEURO:  Nonfocal. Well oriented.  Appropriate affect.     LAB RESULTS:  CMP     Component Value Date/Time   NA 140 12/02/2019 0916   NA 135 (L) 01/07/2017 1212   K 4.4  12/02/2019 0916   K 4.1 01/07/2017 1212   CL 105 12/02/2019 0916   CO2 29 12/02/2019 0916   CO2 26 01/07/2017 1212   GLUCOSE 124 (H) 12/02/2019 0916   GLUCOSE 339 (H) 01/07/2017 1212   BUN 13 12/02/2019 0916   BUN 15.0 01/07/2017 1212   CREATININE 0.68 12/02/2019 0916   CREATININE 0.9 01/07/2017 1212   CALCIUM 9.2 12/02/2019 0916   CALCIUM 9.4 01/07/2017 1212   PROT 6.5 12/02/2019 0916   PROT 6.9 01/07/2017 1212   ALBUMIN 3.9 07/27/2019 1025   ALBUMIN 3.8 01/07/2017 1212   AST 18 12/02/2019 0916   AST 54 (H) 01/07/2017 1212   ALT 25 12/02/2019 0916   ALT 67 (H) 01/07/2017 1212   ALKPHOS 65 07/27/2019 1025   ALKPHOS 99 01/07/2017 1212   BILITOT 0.8 12/02/2019 0916   BILITOT 1.01 01/07/2017 1212   GFRNONAA 106 12/02/2019 0916   GFRAA 123 12/02/2019 0916    I Theresa results found for: SPEP  Lab Results  Component Value Date   WBC 8.7 02/19/2020   NEUTROABS 5.4 02/19/2020   HGB 13.6 02/19/2020   HCT 40.1 02/19/2020   MCV 93.7 02/19/2020   PLT 247 02/19/2020      Chemistry      Component Value Date/Time   NA 140 12/02/2019 0916   NA 135 (L) 01/07/2017 1212   K 4.4 12/02/2019 0916   K 4.1 01/07/2017 1212   CL 105 12/02/2019 0916   CO2 29 12/02/2019 0916   CO2 26 01/07/2017 1212   BUN 13 12/02/2019 0916   BUN 15.0 01/07/2017 1212   CREATININE 0.68 12/02/2019 0916   CREATININE 0.9 01/07/2017 1212      Component Value Date/Time   CALCIUM 9.2 12/02/2019 0916   CALCIUM 9.4 01/07/2017 1212   ALKPHOS 65 07/27/2019 1025   ALKPHOS 99 01/07/2017 1212   AST 18 12/02/2019 0916   AST 54 (H) 01/07/2017 1212   ALT 25 12/02/2019 0916   ALT 67 (H) 01/07/2017 1212   BILITOT 0.8 12/02/2019 0916   BILITOT 1.01 01/07/2017 1212       Theresa results found for: LABCA2  Theresa components found for: LABCA125  Theresa results for input(s): INR in the last 168 hours.  Urinalysis    Component Value Date/Time   COLORURINE YELLOW 01/21/2014 1503   APPEARANCEUR CLEAR 01/21/2014 1503    LABSPEC 1.014 01/21/2014 1503   LABSPEC 1.010 07/16/2013 1538   PHURINE  6.5 01/21/2014 1503   GLUCOSEU NEGATIVE 01/21/2014 1503   GLUCOSEU Negative 07/16/2013 1538   HGBUR NEGATIVE 01/21/2014 1503   BILIRUBINUR NEGATIVE 01/21/2014 1503   BILIRUBINUR Negative 07/16/2013 1538   KETONESUR NEGATIVE 01/21/2014 1503   PROTEINUR NEGATIVE 01/21/2014 1503   UROBILINOGEN 0.2 01/21/2014 1503   UROBILINOGEN 0.2 07/16/2013 1538   NITRITE NEGATIVE 01/21/2014 1503   LEUKOCYTESUR NEGATIVE 01/21/2014 1503   LEUKOCYTESUR Negative 07/16/2013 1538    STUDIES: CLINICAL DATA:  Screening.  EXAM: DIGITAL SCREENING BILATERAL MAMMOGRAM WITH TOMO AND CAD  COMPARISON:  Previous exam(s).  ACR Breast Density Category c: The breast tissue is heterogeneously dense, which may obscure small masses.  FINDINGS: There are Theresa findings suspicious for malignancy. Images were processed with CAD.  IMPRESSION: Theresa mammographic evidence of malignancy. A result letter of this screening mammogram will be mailed directly to the patient.  RECOMMENDATION: Screening mammogram in one year. (Code:[id number]-B-01Y)  BI-RADS CATEGORY  1: Negative.   Electronically Signed   By: Ammie Ferrier M.D.   On: 01/12/2020 07:57  ASSESSMENT: 44 y.o. BRCA negative [place], [place] woman  status post left breast lower inner quadrant lumpectomy with left axillary sentinel lymph node biopsy 12/10/2012 for a pT2, pN65mi, stage IIB invasive ductal carcinoma, grade 3, estrogen receptor 80% positive, progesterone receptor 80% positive, Ki-67 25%, HER-2/neu negative  #1 Genetic testing performed in [place], [place] reported by the patient as BRCA1 and BRCA2 negative.   #2 s/p adjuvant chemotherapy consisting of FEC (5-FU/epirubicin/Cytoxan) begun on 01/23/2013,completed on 04/03/13 (six cycles).   #3 S/p adjuvant weekly Taxol beginning 04/17/13, received  7 of the 12 planned cycles, last dose 06/05/13 (stopped secondary to skin  toxicity)  #6 adjuvant radiation to the left breast and regional lymph nodes completed 08/20/2013  #7 goserelin started 04/17/2013, continued through [place] 2015  (a) s/p BSO 01/26/2014 with benign pathology  #8 anastrozole started 08/14/2013; bone density 08/19/2013 was normal  (a) stopped 04/15/14 because of severe joint stiffness. Switched to letrozole starting 05/03/14  (b) DEXA scan 08/22/2015 showed a T score of -1.7  (c) given history of DVT we are not considering switching to tamoxifen.  (d) bone density 12/17/2017 shows a T score of -2.5--osteoporosis  (e) denosumab/ Prolia started 01/21/2018  #9 Right lower extremity DVT diagnosed on 03/16/2013, patient was placed on Xarelto. Repeat venous Doppler performed in March 2015 was negative for any thrombosis.  Xarelto was stopped Aug 14, 2012.    PLAN:  [name] is doing quite well today.  She has Theresa clinical or radiographic sign of breast cancer recurrence.  She continues on Letrozole with good tolerance.  She will take this for 7 years and her estimated end date is in 6 months.  I reviewed that we have learned that women who take the letrozole for 7 years get the same benefit in recurrence risk reduction as women who take it for 10 years, however women who take it for 10 years have more side effects, particularly related to osteoporosis and fractures.  She understands this.  [name] will receive prolia today.  Her labs are normal and her calcium level was stable.  She tolerates this well.  I placed orders for her to undergo a repeat bone density test  [name] is working on weight loss, healthy diet and exercise.  I encouraged her to continue with this.  She tells me she lost 30 pounds which is wonderful!    We will see [name] back in 6 months for labs, f/u, and  Prolia.  She knows to call for any questions that may arise between now and her next appointment.  We are happy to see her sooner if needed.   Total encounter time: 30  minutes*  Wilber Bihari, NP 02/19/20 8:40 AM Medical Oncology and Hematology [facility] [place], [place] [address] Tel. [phone]    Fax. [phone]  *Total Encounter Time as defined by the Centers for Medicare and Medicaid Services includes, in addition to the face-to-face time of a patient visit (documented in the note above) non-face-to-face time: obtaining and reviewing outside history, ordering and reviewing medications, tests or procedures, care coordination (communications with other health care professionals or caregivers) and documentation in the medical record.

## 2020-04-17 ENCOUNTER — Other Ambulatory Visit: Payer: Self-pay | Admitting: Oncology

## 2020-06-08 ENCOUNTER — Ambulatory Visit: Payer: BC Managed Care – PPO | Admitting: "Endocrinology

## 2020-06-16 LAB — COMPREHENSIVE METABOLIC PANEL
ALT: 17 IU/L (ref 0–32)
AST: 17 IU/L (ref 0–40)
Albumin/Globulin Ratio: 2.2 (ref 1.2–2.2)
Albumin: 4.6 g/dL (ref 3.8–4.8)
Alkaline Phosphatase: 56 IU/L (ref 44–121)
BUN/Creatinine Ratio: 19 (ref 9–23)
BUN: 14 mg/dL (ref 6–24)
Bilirubin Total: 0.9 mg/dL (ref 0.0–1.2)
CO2: 22 mmol/L (ref 20–29)
Calcium: 9.6 mg/dL (ref 8.7–10.2)
Chloride: 101 mmol/L (ref 96–106)
Creatinine, Ser: 0.72 mg/dL (ref 0.57–1.00)
Globulin, Total: 2.1 g/dL (ref 1.5–4.5)
Glucose: 132 mg/dL — ABNORMAL HIGH (ref 65–99)
Potassium: 4.3 mmol/L (ref 3.5–5.2)
Sodium: 141 mmol/L (ref 134–144)
Total Protein: 6.7 g/dL (ref 6.0–8.5)
eGFR: 105 mL/min/{1.73_m2} (ref >59)

## 2020-06-20 ENCOUNTER — Other Ambulatory Visit: Payer: Self-pay

## 2020-06-20 ENCOUNTER — Ambulatory Visit: Payer: BC Managed Care – PPO | Admitting: "Endocrinology

## 2020-06-20 ENCOUNTER — Encounter: Payer: Self-pay | Admitting: "Endocrinology

## 2020-06-20 VITALS — BP 136/78 | HR 84 | Ht 68.0 in | Wt 227.8 lb

## 2020-06-20 DIAGNOSIS — I10 Essential (primary) hypertension: Secondary | ICD-10-CM

## 2020-06-20 DIAGNOSIS — E782 Mixed hyperlipidemia: Secondary | ICD-10-CM | POA: Diagnosis not present

## 2020-06-20 DIAGNOSIS — E119 Type 2 diabetes mellitus without complications: Secondary | ICD-10-CM

## 2020-06-20 LAB — POCT GLYCOSYLATED HEMOGLOBIN (HGB A1C): HbA1c, POC (controlled diabetic range): 6.1 % (ref 0.0–7.0)

## 2020-06-20 NOTE — Patient Instructions (Signed)

## 2020-06-20 NOTE — Progress Notes (Signed)
06/20/2020, 3:42 PM Endocrinology follow-up note   Subjective:    Patient ID: [name], female    DOB: 11-14-1975.  [name] is being seen in follow-up  for management of currently uncontrolled symptomatic type 2 diabetes, hypertension, hyperlipidemia.  PCP: Patient, No Pcp Per (Inactive).   Past Medical History:  Diagnosis Date  . Anxiety   . Breast cancer ([place]) 10/16/12   left, lower medial, 8 o'clock  . DVT (deep venous thrombosis) ([place]) 02/2013   RT LEG   . Family history of anesthesia complication    pt's mother has hx. of post-op N/V  . History of radiation therapy 07/06/13-08/20/13   left breast/  . History of transfusion   . Hypertension    under control with med., has been on med. since 09/2012  . Pain in the wrist    BILATERAL  . Personal history of chemotherapy 2014  . Personal history of radiation therapy 2014   Past Surgical History:  Procedure Laterality Date  . BREAST BIOPSY Right 11/2017   benign  . BREAST LUMPECTOMY Left 2014  . BREAST LUMPECTOMY WITH NEEDLE LOCALIZATION AND AXILLARY SENTINEL LYMPH NODE BX Left 12/10/2012   Procedure: BREAST LUMPECTOMY WITH NEEDLE LOCALIZATION AND AXILLARY SENTINEL LYMPH NODE BX;  Surgeon: Merrie Roof, MD;  Location: [facility];  Service: General;  Laterality: Left;  Needle loc BCG 7:30  nuc med 9:30    . CESAREAN [place]  . CHOLECYSTECTOMY  1999  . LEEP N/A 01/26/2014   Procedure: LOOP ELECTROSURGICAL EXCISION PROCEDURE (LEEP);  Surgeon: Lucita Lora. Alycia Rossetti, MD;  Location: [facility];  Service: Gynecology;  Laterality: N/A;  . OPEN REDUCTION INTERNAL FIXATION (ORIF) TIBIA/FIBULA FRACTURE Right   . PORT-A-CATH REMOVAL    . PORTACATH PLACEMENT Right 01/09/2013   Procedure: INSERTION PORT-A-CATH;  Surgeon: Merrie Roof, MD;  Location: [facility];  Service: General;  Laterality: Right;  . ROBOTIC ASSISTED BILATERAL  SALPINGO OOPHERECTOMY Bilateral 01/26/2014   Procedure: ROBOTIC ASSISTED BILATERAL SALPINGO OOPHORECTOMY;  Surgeon: Imagene Gurney A. Alycia Rossetti, MD;  Location: [facility];  Service: Gynecology;  Laterality: Bilateral;  . TONSILLECTOMY AND ADENOIDECTOMY     as a teenager   Social History   Socioeconomic History  . Marital status: Divorced    Spouse name: Not on file  . Number of children: Not on file  . Years of education: Not on file  . Highest education level: Not on file  Occupational History  . Not on file  Tobacco Use  . Smoking status: Former Smoker    Quit date: 12/03/2012    Years since quitting: 7.5  . Smokeless tobacco: Never Used  Vaping Use  . Vaping Use: Never used  Substance and Sexual Activity  . Alcohol use: Yes    Comment: occasionally  . Drug use: No  . Sexual activity: Yes    Birth control/protection: Condom    Comment: menarche age 65, P53 @ age 68,  last menstrual cycle 09/29/12  Other Topics Concern  . Not on file  Social History Narrative  . Not on file   Social Determinants of Health  Financial Resource Strain: Not on file  Food Insecurity: Not on file  Transportation Needs: Not on file  Physical Activity: Not on file  Stress: Not on file  Social Connections: Not on file   Outpatient Encounter Medications as of 06/20/2020  Medication Sig  . b complex vitamins tablet Take 1 tablet by mouth every morning.  Marland Kitchen BIOTIN PO Take 1 tablet by mouth every morning.  . Cholecalciferol 2000 units CAPS Take 1 capsule by mouth daily.  Marland Kitchen letrozole (FEMARA) 2.5 MG tablet Take 1 tablet (2.5 mg total) by mouth daily.  Marland Kitchen lisinopril (ZESTRIL) 10 MG tablet TAKE 1 TABLET BY MOUTH EVERY DAY IN THE MORNING  . metformin (FORTAMET) 500 MG (OSM) 24 hr tablet Take 1 tablet (500 mg total) by mouth daily with breakfast.   No facility-administered encounter medications on file as of 06/20/2020.    ALLERGIES: Allergies  Allergen Reactions  . Influenza Virus Vaccine Swelling  . Amoxicillin  Other (See Comments)    States "it does not work"    VACCINATION STATUS:  There is no immunization history on file for this patient.  Diabetes She presents for her follow-up diabetic visit. She has type 2 diabetes mellitus. Onset time: She was diagnosed at approximate age of 76 years. This is following exposure to high-dose steroids related to treatment for breast cancer. Her disease course has been stable. There are no hypoglycemic associated symptoms. Pertinent negatives for hypoglycemia include no confusion, headaches, pallor or seizures. Pertinent negatives for diabetes include no chest pain, no fatigue, no polydipsia, no polyphagia and no polyuria. There are no hypoglycemic complications. Symptoms are stable. There are no diabetic complications. Risk factors for coronary artery disease include diabetes mellitus, hypertension, obesity, tobacco exposure and sedentary lifestyle. Current diabetic treatments: She is taking Metformin 500 mg p.o. twice daily. Her weight is fluctuating minimally. She is following a diabetic diet. When asked about meal planning, she reported none. She has had a previous visit with a dietitian. She participates in exercise intermittently. There is no change in her home blood glucose trend. (She presents with controlled glycemic profile.  Point-of-care A1c 5.8% improving from 7.3%.) An ACE inhibitor/angiotensin II receptor blocker is being taken. She does not see a podiatrist.Eye exam is not current.  Hypertension This is a chronic problem. The current episode started more than 1 year ago. The problem is controlled. Pertinent negatives include no chest pain, headaches, palpitations or shortness of breath. Risk factors for coronary artery disease include diabetes mellitus, obesity, sedentary lifestyle, smoking/tobacco exposure and family history. Past treatments include ACE inhibitors.     Review of systems  Constitutional: + Progressive weight loss,  current  Body mass  index is 34.64 kg/m. , no fatigue, no subjective hyperthermia, no subjective hypothermia    Objective:    BP 136/78   Pulse 84   Ht $R'5\' 8"'HX$  (1.727 m)   Wt 227 lb 12.8 oz (103.3 kg)   LMP 12/28/2012   BMI 34.64 kg/m   Wt Readings from Last 3 Encounters:  06/20/20 227 lb 12.8 oz (103.3 kg)  02/19/20 223 lb (101.2 kg)  12/09/19 223 lb 3.2 oz (101.2 kg)      Physical Exam- Limited  Constitutional:  Body mass index is 34.64 kg/m. , not in acute distress, normal state of mind    Recent Results (from the past 2160 hour(s))  Comprehensive metabolic panel     Status: Abnormal   Collection Time: 06/15/20  8:54 AM  Result Value Ref Range  Glucose 132 (H) 65 - 99 mg/dL   BUN 14 6 - 24 mg/dL   Creatinine, Ser 0.72 0.57 - 1.00 mg/dL   eGFR 105 >59 mL/min/1.73   BUN/Creatinine Ratio 19 9 - 23   Sodium 141 134 - 144 mmol/L   Potassium 4.3 3.5 - 5.2 mmol/L   Chloride 101 96 - 106 mmol/L   CO2 22 20 - 29 mmol/L   Calcium 9.6 8.7 - 10.2 mg/dL   Total Protein 6.7 6.0 - 8.5 g/dL   Albumin 4.6 3.8 - 4.8 g/dL   Globulin, Total 2.1 1.5 - 4.5 g/dL   Albumin/Globulin Ratio 2.2 1.2 - 2.2   Bilirubin Total 0.9 0.0 - 1.2 mg/dL   Alkaline Phosphatase 56 44 - 121 IU/L   AST 17 0 - 40 IU/L   ALT 17 0 - 32 IU/L  HgB A1c     Status: None   Collection Time: 06/20/20  2:33 PM  Result Value Ref Range   Hemoglobin A1C     HbA1c POC (<> result, manual entry)     HbA1c, POC (prediabetic range)     HbA1c, POC (controlled diabetic range) 6.1 0.0 - 7.0 %   Lipid Panel     Component Value Date/Time   CHOL 158 12/02/2019 0916   TRIG 171 (H) 12/02/2019 0916   HDL 40 (L) 12/02/2019 0916   CHOLHDL 4.0 12/02/2019 0916   LDLCALC 91 12/02/2019 0916     Assessment & Plan:   1. Uncontrolled type 2 diabetes mellitus with hyperglycemia ([place])  - Eyonna Sandstrom has currently controlled asymptomatic type 2 DM since  45 years of age.  She presents with controlled glycemic profile and point-of-care A1c  of 6.1% . I reviewed her recent labs with her.  - She does not report any gross competitions from her diabetes, however Kennadi Albany remains at a high risk for more acute and chronic complications which include CAD, CVA, CKD, retinopathy, and neuropathy. These are all discussed in detail with the patient.  - I have counseled her on diet management and weight loss, by adopting a carbohydrate restricted/protein rich diet.  - she have made significant changes in her lifestyle, lost 35 pounds so far.      - she acknowledges that there is a room for improvement in her food and drink choices. - Suggestion is made for her to avoid simple carbohydrates  from her diet including Cakes, Sweet Desserts, Ice Cream, Soda (diet and regular), Sweet Tea, Candies, Chips, Cookies, Store Bought Juices, Alcohol in Excess of  1-2 drinks a day, Artificial Sweeteners,  Coffee Creamer, and "Sugar-free" Products, Lemonade. This will help patient to have more stable blood glucose profile and potentially avoid unintended weight gain.   - I encouraged her to switch to  unprocessed or minimally processed complex starch and increased protein intake (animal or plant source), fruits, and vegetables.  - she is advised to stick to a routine mealtimes to eat 3 meals  a day and avoid unnecessary snacks ( to snack only to correct hypoglycemia).   - I have approached her with the following individualized plan to manage diabetes and patient agrees:   -Based on her response to Metformin therapy, she will not need any additional treatment at this time.   -She is advised to continue Metformin 500 mg ER daily after breakfast.         -She has several choices for treatment if she loses control of diabetes by next visit. - Patient  specific target  A1c;  LDL, HDL, Triglycerides, and  Waist Circumference were discussed in detail.  2) BP/HTN:  -Her blood pressure is controlled to target.  She is advised to continue her current blood  pressure medications including lisinopril 10 mg p.o. daily.     3) Lipids/HPL: Her fasting lipid panel shows LDL increasing to 91.  She is hesitant to get on Crestor.      4)  Weight/Diet: Her BMI is 34.6 -patient largely kept of the weight she lost.   She is still a candidate for more weight loss.  CDE Consult has been  initiated , exercise, and detailed carbohydrates information provided.  5) Chronic Care/Health Maintenance:  -she  is on ACEI medications and  is encouraged to continue to follow up with Ophthalmology, Dentist,  Podiatrist at least yearly or according to recommendations, and advised to take away from smoking. I have recommended yearly flu vaccine and pneumonia vaccination at least every 5 years; moderate intensity exercise for up to 150 minutes weekly; and  sleep for at least 7 hours a day.  - I advised patient to maintain close follow up with Patient, No Pcp Per (Inactive) for primary care needs. - Time spent on this patient care encounter:  40 min, of which > 50% was spent in  counseling and the rest reviewing her blood glucose logs , discussing her hypoglycemia and hyperglycemia episodes, reviewing her current and  previous labs / studies  ( including abstraction from other facilities) and medications  doses and developing a  long term treatment plan and documenting her care.   Please refer to Patient Instructions for Blood Glucose Monitoring and Insulin/Medications Dosing Guide"  in media tab for additional information. Please  also refer to " Patient Self Inventory" in the Media  tab for reviewed elements of pertinent patient history.  [name] participated in the discussions, expressed understanding, and voiced agreement with the above plans.  All questions were answered to her satisfaction. she is encouraged to contact clinic should she have any questions or concerns prior to her return visit.  Follow up plan: - Return if symptoms worsen or fail to improve.  Glade Lloyd, MD [facility] [address] [place], [place] [address] Phone: [phone]  Fax: [phone]    06/20/2020, 3:42 PM  This note was partially dictated with voice recognition software. Similar sounding words can be transcribed inadequately or may not  be corrected upon review.

## 2020-07-01 ENCOUNTER — Telehealth: Payer: Self-pay | Admitting: Oncology

## 2020-07-01 ENCOUNTER — Other Ambulatory Visit: Payer: Self-pay | Admitting: Oncology

## 2020-07-01 NOTE — Telephone Encounter (Signed)
Scheduled appt per 4/15 sch msg. Pt aware.  

## 2020-07-26 ENCOUNTER — Other Ambulatory Visit: Payer: Self-pay | Admitting: Oncology

## 2020-08-16 ENCOUNTER — Other Ambulatory Visit: Payer: Self-pay | Admitting: "Endocrinology

## 2020-08-16 DIAGNOSIS — E119 Type 2 diabetes mellitus without complications: Secondary | ICD-10-CM

## 2020-08-18 ENCOUNTER — Ambulatory Visit: Payer: BC Managed Care – PPO

## 2020-08-18 ENCOUNTER — Other Ambulatory Visit: Payer: BC Managed Care – PPO

## 2020-08-18 ENCOUNTER — Ambulatory Visit: Payer: BC Managed Care – PPO | Admitting: Oncology

## 2020-08-31 ENCOUNTER — Telehealth: Payer: Self-pay | Admitting: Oncology

## 2020-08-31 NOTE — Telephone Encounter (Signed)
R/s appt per 6/15 sch msg. Pt aware.

## 2020-09-01 ENCOUNTER — Other Ambulatory Visit: Payer: BC Managed Care – PPO

## 2020-09-01 ENCOUNTER — Ambulatory Visit: Payer: BC Managed Care – PPO | Admitting: Oncology

## 2020-09-01 ENCOUNTER — Ambulatory Visit: Payer: BC Managed Care – PPO

## 2020-09-06 ENCOUNTER — Ambulatory Visit
Admission: RE | Admit: 2020-09-06 | Discharge: 2020-09-06 | Disposition: A | Discharge status: Home / Self Care | Payer: BC Managed Care – PPO | Source: Ambulatory Visit | Attending: Adult Health | Admitting: Adult Health

## 2020-09-06 ENCOUNTER — Other Ambulatory Visit: Payer: Self-pay

## 2020-09-06 DIAGNOSIS — C50312 Malignant neoplasm of lower-inner quadrant of left female breast: Secondary | ICD-10-CM

## 2020-09-21 NOTE — Progress Notes (Signed)
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   In June she completed 7 years of adjuvant breast cancer treatment with Letrozole.  She is doing well since then and just got a new job that she is excited about.    Her most recent bilateral screening mammogram was completed on 01/12/2020 and showed no evidence of malignancy and breast density category C.  She also underwent bone density testing on 09/06/2020 that showed improvement from osteoporosis to osteopenia.  She has been receiving prolia every 6 months since 01/2018.   REVIEW OF SYSTEMS:  Review of Systems  Constitutional:  Negative for appetite change, chills, fatigue, fever and unexpected weight change.  HENT:   Negative for hearing loss, lump/mass and trouble swallowing.   Eyes:  Negative for eye problems and icterus.  Respiratory:  Negative for chest tightness, cough and shortness of breath.   Cardiovascular:  Negative for chest pain, leg swelling and palpitations.  Gastrointestinal:  Negative for abdominal distention, abdominal pain, constipation, diarrhea, nausea and vomiting.  Endocrine: Negative for hot flashes.  Genitourinary:  Negative for difficulty urinating.   Musculoskeletal:  Negative for arthralgias.  Skin:  Negative for itching and rash.  Neurological:  Negative for dizziness, extremity weakness, headaches and numbness.  Hematological:  Negative for adenopathy. Does not  bruise/bleed easily.  Psychiatric/Behavioral:  Negative for depression. The patient is not nervous/anxious.      BREAST CANCER HISTORY: From Dr [name] original intake nodes:  A screening mammogram in [place], [place] on 09/23/2012 she was felt to have a new mass within the left breast. An ultrasound on 10/06/2012 showed a 1.6 x 1.3 x 1.3 cm mass at the 8 o'clock position 7 cm from the nipple. At the 12 o'clock position behind and nipple was a circumscribed oval shaped 2 x 1.8 x 1.2 cm lesion perhaps representing a fibroadenoma. 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 The carcinoma was grade 2-3, estrogen receptor 80% positive, progesterone receptor 80% positive, Ki-67 25%, and HER-2/neu negative. Bilateral breast MRI on 10/22/2012 showed biopsy proven carcinoma in the left lower inner quadrant identified and measures approximately 1.9 x 1.9 x 1.8 cm. Well-defined nodule in the right upper outer quadrant posteriorly consistent with a fibroadenoma. Probable liver cysts.There were multiple scattered nonspecific foci in each breast. No other suspicious abnormality was identified. No abnormal appearing lymph nodes.   The patient's subsequent history is as detailed below   PAST MEDICAL HISTORY: Past Medical History:  Diagnosis Date   Anxiety    Breast cancer ([place]) 10/16/12   left, lower medial, 8 o'clock   DVT (deep venous thrombosis) ([place]) 02/2013   RT LEG    Family history of anesthesia complication    pt's mother has hx. of post-op N/V   History of radiation therapy 07/06/13-08/20/13   left breast/   History of transfusion    Hypertension    under control with med., has been on med. since 09/2012  Pain in the wrist    BILATERAL   Personal history of chemotherapy 2014   Personal history of radiation therapy 2014    PAST SURGICAL HISTORY: Past Surgical History:   Procedure Laterality Date   BREAST BIOPSY Right 11/2017   benign   BREAST LUMPECTOMY Left 2014   BREAST LUMPECTOMY WITH NEEDLE LOCALIZATION AND AXILLARY SENTINEL LYMPH NODE BX Left 12/10/2012   Procedure: BREAST LUMPECTOMY WITH NEEDLE LOCALIZATION AND AXILLARY SENTINEL LYMPH NODE BX;  Surgeon: Theresa Roof, MD;  Location: [facility];  Service: General;  Laterality: Left;  Needle loc BCG 7:30  nuc med 9:30     [place]   LEEP N/A 01/26/2014   Procedure: LOOP ELECTROSURGICAL EXCISION PROCEDURE (LEEP);  Surgeon: Theresa Lora. Alycia Rossetti, MD;  Location: [facility];  Service: Gynecology;  Laterality: N/A;   OPEN REDUCTION INTERNAL FIXATION (ORIF) TIBIA/FIBULA FRACTURE Right    PORT-A-CATH REMOVAL     PORTACATH PLACEMENT Right 01/09/2013   Procedure: INSERTION PORT-A-CATH;  Surgeon: Theresa Roof, MD;  Location: [facility];  Service: General;  Laterality: Right;   ROBOTIC ASSISTED BILATERAL SALPINGO OOPHERECTOMY Bilateral 01/26/2014   Procedure: ROBOTIC ASSISTED BILATERAL SALPINGO OOPHORECTOMY;  Surgeon: Theresa Lora. Alycia Rossetti, MD;  Location: [facility];  Service: Gynecology;  Laterality: Bilateral;   TONSILLECTOMY AND ADENOIDECTOMY     as a teenager    FAMILY HISTORY Family History  Problem Relation Age of Onset   Anesthesia problems Mother        post-op N/V   Breast cancer Maternal Grandmother        50-60s   Lung cancer Paternal Grandmother     GYNECOLOGIC HISTORY: menarche at age 81, first live birth age 29, she is [place] P1, birth control x 17 years without difficulty, h/o occasional abnormal pap smears, has underwent colposcopy, no h/o sexually transmitted infections; stopped having periods in September of 2014, with chemotherapy    SOCIAL HISTORY:  (Updated on 01/07/2017) 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 She is divorced. Her  daughter, [name] is now in her third year at [employer] with a minor in art.    ADVANCED DIRECTIVES: (Updated 01/12/2014) Not in place. At her 10/20/2013 visit the patient was given the appropriate documents to complete and notarize at her discretion. She tells me she intends to name her mother, [name], as healthcare power of attorney. She can be reached at [place]: Social History   Tobacco Use   Smoking status: Former    Pack years: 0.00    Types: Cigarettes    Quit date: 12/03/2012    Years since quitting: 7.8   Smokeless tobacco: Never  Vaping Use   Vaping Use: Never used  Substance Use Topics   Alcohol use: Yes    Comment: occasionally   Drug use: No    Colonoscopy: n/a Pap Smear: 03/2013 Eye Exam: 08/2012 Vitamin D Level: pending Lipid Panel: not  Checked recently   Allergies  Allergen Reactions   Influenza Virus Vaccine Swelling   Amoxicillin Other (See Comments)    States "it does not work"    Current Outpatient Medications  Medication Sig Dispense Refill   b complex vitamins tablet Take 1 tablet by mouth every morning.     BIOTIN PO Take 1 tablet by mouth every  morning.     Cholecalciferol 2000 units CAPS Take 1 capsule by mouth daily.     letrozole (FEMARA) 2.5 MG tablet Take 1 tablet (2.5 mg total) by mouth daily. 90 tablet 4   lisinopril (ZESTRIL) 10 MG tablet TAKE 1 TABLET BY MOUTH EVERY DAY IN THE MORNING 90 tablet 0   metformin (FORTAMET) 1000 MG (OSM) 24 hr tablet TAKE 1 TABLET (1,000 MG TOTAL) BY MOUTH DAILY AFTER BREAKFAST. 90 tablet 1   metformin (FORTAMET) 500 MG (OSM) 24 hr tablet Take 1 tablet (500 mg total) by mouth daily with breakfast. 90 tablet 1   No current facility-administered medications for this visit.    OBJECTIVE:  Vitals:   09/22/20 1319  BP: 124/72  Pulse: 76  Resp: 18  Temp: 97.8 F (36.6 C)  SpO2: 100%      Body mass index is 35.34 kg/m.     ECOG FS:0 - Asymptomatic   GENERAL: Patient is a well appearing female in no acute distress HEENT:  Sclerae anicteric.  Mask in place.. Neck is supple.  NODES:  No cervical, supraclavicular, or axillary lymphadenopathy palpated.  BREAST EXAM:  Right breast benign, left breast s/p lumpectomy and radiation, no sign of local recurrence LUNGS:  Clear to auscultation bilaterally.  No wheezes or rhonchi. HEART:  Regular rate and rhythm. No murmur appreciated. ABDOMEN:  Soft, nontender.  Positive, normoactive bowel sounds. No organomegaly palpated. MSK:  No focal spinal tenderness to palpation. Full range of motion bilaterally in the upper extremities. EXTREMITIES:  No peripheral edema.   SKIN:  Clear with no obvious rashes or skin changes. No nail dyscrasia. NEURO:  Nonfocal. Well oriented.  Appropriate affect.     LAB RESULTS:  CMP     Component Value Date/Time   NA 141 06/15/2020 0854   NA 135 (L) 01/07/2017 1212   K 4.3 06/15/2020 0854   K 4.1 01/07/2017 1212   CL 101 06/15/2020 0854   CO2 22 06/15/2020 0854   CO2 26 01/07/2017 1212   GLUCOSE 132 (H) 06/15/2020 0854   GLUCOSE 171 (H) 02/19/2020 0820   GLUCOSE 339 (H) 01/07/2017 1212   BUN 14 06/15/2020 0854   BUN 15.0 01/07/2017 1212   CREATININE 0.72 06/15/2020 0854   CREATININE 0.68 12/02/2019 0916   CREATININE 0.9 01/07/2017 1212   CALCIUM 9.6 06/15/2020 0854   CALCIUM 9.4 01/07/2017 1212   PROT 6.7 06/15/2020 0854   PROT 6.9 01/07/2017 1212   ALBUMIN 4.6 06/15/2020 0854   ALBUMIN 3.8 01/07/2017 1212   AST 17 06/15/2020 0854   AST 54 (H) 01/07/2017 1212   ALT 17 06/15/2020 0854   ALT 67 (H) 01/07/2017 1212   ALKPHOS 56 06/15/2020 0854   ALKPHOS 99 01/07/2017 1212   BILITOT 0.9 06/15/2020 0854   BILITOT 1.01 01/07/2017 1212   GFRNONAA >60 02/19/2020 0820   GFRNONAA 106 12/02/2019 0916   GFRAA 123 12/02/2019 0916    I No results found for: SPEP  Lab Results  Component Value Date   WBC 9.3 09/22/2020   NEUTROABS 5.0 09/22/2020   HGB  13.7 09/22/2020   HCT 39.6 09/22/2020   MCV 91.9 09/22/2020   PLT 244 09/22/2020      Chemistry      Component Value Date/Time   NA 141 06/15/2020 0854   NA 135 (L) 01/07/2017 1212   K 4.3 06/15/2020 0854   K 4.1 01/07/2017 1212   CL 101 06/15/2020 0854   CO2 22 06/15/2020 0854  CO2 26 01/07/2017 1212   BUN 14 06/15/2020 0854   BUN 15.0 01/07/2017 1212   CREATININE 0.72 06/15/2020 0854   CREATININE 0.68 12/02/2019 0916   CREATININE 0.9 01/07/2017 1212      Component Value Date/Time   CALCIUM 9.6 06/15/2020 0854   CALCIUM 9.4 01/07/2017 1212   ALKPHOS 56 06/15/2020 0854   ALKPHOS 99 01/07/2017 1212   AST 17 06/15/2020 0854   AST 54 (H) 01/07/2017 1212   ALT 17 06/15/2020 0854   ALT 67 (H) 01/07/2017 1212   BILITOT 0.9 06/15/2020 0854   BILITOT 1.01 01/07/2017 1212       No results found for: LABCA2  No components found for: LABCA125  No results for input(s): INR in the last 168 hours.  Urinalysis    Component Value Date/Time   COLORURINE YELLOW 01/21/2014 1503   APPEARANCEUR CLEAR 01/21/2014 1503   LABSPEC 1.014 01/21/2014 1503   LABSPEC 1.010 07/16/2013 1538   PHURINE 6.5 01/21/2014 1503   GLUCOSEU NEGATIVE 01/21/2014 1503   GLUCOSEU Negative 07/16/2013 1538   HGBUR NEGATIVE 01/21/2014 1503   BILIRUBINUR NEGATIVE 01/21/2014 1503   BILIRUBINUR Negative 07/16/2013 1538   KETONESUR NEGATIVE 01/21/2014 1503   PROTEINUR NEGATIVE 01/21/2014 1503   UROBILINOGEN 0.2 01/21/2014 1503   UROBILINOGEN 0.2 07/16/2013 1538   NITRITE NEGATIVE 01/21/2014 1503   LEUKOCYTESUR NEGATIVE 01/21/2014 1503   LEUKOCYTESUR Negative 07/16/2013 1538    STUDIES: CLINICAL DATA:  Screening.   EXAM: DIGITAL SCREENING BILATERAL MAMMOGRAM WITH TOMO AND CAD   COMPARISON:  Previous exam(s).   ACR Breast Density Category c: The breast tissue is heterogeneously dense, which may obscure small masses.   FINDINGS: There are no findings suspicious for malignancy. Images  were processed with CAD.   IMPRESSION: No mammographic evidence of malignancy. A result letter of this screening mammogram will be mailed directly to the patient.   RECOMMENDATION: Screening mammogram in one year. (Code:SM-B-01Y)   BI-RADS CATEGORY  1: Negative.     Electronically Signed   By: Ammie Ferrier M.D.   On: 01/12/2020 07:57  EXAM: DUAL X-RAY ABSORPTIOMETRY (DXA) FOR BONE MINERAL DENSITY   IMPRESSION: Referring Physician:  Gardenia Phlegm Your patient completed a bone mineral density test using GE Lunar iDXA system (analysis version: 16). Technologist: [place] PATIENT: Name: [name], [name] Patient ID: [id number] Birth Date: 1976/01/12 Height: 67.5 in. Sex: Female Measured: 09/06/2020 Weight: 233.6 lbs.   Indications: Bilateral Ovariectomy (65.51), Breast Cancer History, Caucasian, Diabetic non insulin, Effexor, Estrogen Deficient, Family History of Osteoporosis, Height Loss (781.91), History of Osteopenia, Letrozole, Postmenopausal, Secondary Osteoporosis, Tobacco User (Current Smoker) Fractures: None Treatments: Hormone Therapy For Cancer, Prolia, Vitamin D (E933.5)   ASSESSMENT: The BMD measured at AP Spine L1-L4 is 0.923 g/cm2 with a T-score of -2.1. This patient is considered osteopenic/low bone mass according to [facility]) criteria.   The quality of the exam is good. Patient does not meet critieria for FRAX due to current use of Prolia.   Site Region Measured Date Measured Age YA BMD Significant CHANGE T-score   AP Spine L1-L4 09/06/2020 45.3 -2.1 0.923 g/cm2 * AP Spine L1-L4 12/17/2017 42.6 -2.5 0.888 g/cm2 *   DualFemur Neck Right 09/06/2020 45.3 -1.5 0.828 g/cm2 * DualFemur Neck Right 12/17/2017 42.6 -1.8 0.785 g/cm2 *   DualFemur Total Mean 09/06/2020 45.3 -0.5 0.946 g/cm2 * DualFemur Total Mean 12/17/2017 42.6 -0.8 0.912 g/cm2 *   World Health Organization [facility]) criteria for post-menopausal, Caucasian  Women: Normal  T-score at or above -1 SD Osteopenia   T-score between -1 and -2.5 SD Osteoporosis T-score at or below -2.5 SD   RECOMMENDATION: 1. All patients should optimize calcium and vitamin D intake. 2. Consider FDA-approved medical therapies in postmenopausal women and men aged 82 years and older, based on the following: a. A hip or vertebral (clinical or morphometric) fracture. b. T-score = -2.5 at the femoral neck or spine after appropriate evaluation to exclude secondary causes. c. Low bone mass (T-score between -1.0 and -2.5 at the femoral neck or spine) and a 10-year probability of a hip fracture = 3% or a 10-year probability of a major osteoporosis-related fracture = 20% based on the [place]-adapted WHO algorithm. d. Clinician judgment and/or patient preferences may indicate treatment for people with 10-year fracture probabilities above or below these levels.   FOLLOW-UP: Patients with diagnosis of osteoporosis or at high risk for fracture should have regular bone mineral density tests.? Patients eligible for Medicare are allowed routine testing every 2 years.? The testing frequency can be increased to one year for patients who have rapidly progressing disease, are receiving or discontinuing medical therapy to restore bone mass, or have additional risk factors.     Electronically Signed   By: Marlaine Hind M.D.   On: 09/06/2020 13:42  ASSESSMENT: 45 y.o. BRCA negative [place], [place] woman  status post left breast lower inner quadrant lumpectomy with left axillary sentinel lymph node biopsy 12/10/2012 for a pT2, pN24mi, stage IIB invasive ductal carcinoma, grade 3, estrogen receptor 80% positive, progesterone receptor 80% positive, Ki-67 25%, HER-2/neu negative  #1 Genetic testing performed in [place], [place] reported by the patient as BRCA1 and BRCA2 negative.   #2 s/p adjuvant chemotherapy consisting of FEC (5-FU/epirubicin/Cytoxan) begun on 01/23/2013,completed  on 04/03/13 (six cycles).   #3 S/p adjuvant weekly Taxol beginning 04/17/13, received  7 of the 12 planned cycles, last dose 06/05/13 (stopped secondary to skin toxicity)  #6 adjuvant radiation to the left breast and regional lymph nodes completed 08/20/2013  #7 goserelin started 04/17/2013, continued through [place] 2015  (a) s/p BSO 01/26/2014 with benign pathology  #8 anastrozole started 08/14/2013; bone density 08/19/2013 was normal  (a) stopped 04/15/14 because of severe joint stiffness. Switched to letrozole starting 05/03/14  (b) DEXA scan 08/22/2015 showed a T score of -1.7  (c) given history of DVT we are not considering switching to tamoxifen.  (d) bone density 12/17/2017 shows a T score of -2.5--osteoporosis  (e) denosumab/ Prolia started 01/21/2018  #9 Right lower extremity DVT diagnosed on 03/16/2013, patient was placed on Xarelto. Repeat venous Doppler performed in March 2015 was negative for any thrombosis.  Xarelto was stopped Aug 14, 2012.    PLAN:  [name] is here today for f/u of her breast cancer.  She has no signs of local recurrence, which is great.  She will continue with annual mammogram with her next mammogram due in 12/2020.  She has completed 7 years of Letrozole therapy, and can stop taking Letrozole.    [name] and I reviewed her bone density testing.  She has improvement with prolia. She has stopped letrozole which helped contribute to her decreased bone density.  She will receive two more doses, then stop Prolia and we will recheck her bone density in 08/2022.    I reviewed with [name] that Dr. Jana Hakim is retiring.  She would prefer to f/u with me in 6 months.  She knows to call for any questions that may arise between now and  her next appointment.  We are happy to see her sooner if needed.   Total encounter time: 30 minutes* in chart review, face to face visit time, lab review, order entry and documentation of the encounter.   Wilber Bihari, NP 09/22/20 1:38  PM [facility] [place], [place] [address] Tel. [phone]    Fax. [phone]  *Total Encounter Time as defined by the Centers for Medicare and Medicaid Services includes, in addition to the face-to-face time of a patient visit (documented in the note above) non-face-to-face time: obtaining and reviewing outside history, ordering and reviewing medications, tests or procedures, care coordination (communications with other health care professionals or caregivers) and documentation in the medical record.

## 2020-09-22 ENCOUNTER — Inpatient Hospital Stay: Payer: BC Managed Care – PPO | Attending: Adult Health

## 2020-09-22 ENCOUNTER — Inpatient Hospital Stay: Payer: BC Managed Care – PPO | Admitting: Adult Health

## 2020-09-22 ENCOUNTER — Inpatient Hospital Stay: Payer: BC Managed Care – PPO

## 2020-09-22 ENCOUNTER — Other Ambulatory Visit: Payer: Self-pay

## 2020-09-22 VITALS — BP 124/72 | HR 76 | Temp 97.8°F | Resp 18 | Ht 68.0 in | Wt 232.4 lb

## 2020-09-22 DIAGNOSIS — C50312 Malignant neoplasm of lower-inner quadrant of left female breast: Secondary | ICD-10-CM

## 2020-09-22 DIAGNOSIS — Z170 Estrogen receptor positive status [ER+]: Secondary | ICD-10-CM | POA: Insufficient documentation

## 2020-09-22 DIAGNOSIS — M8580 Other specified disorders of bone density and structure, unspecified site: Secondary | ICD-10-CM | POA: Diagnosis not present

## 2020-09-22 LAB — CBC WITH DIFFERENTIAL/PLATELET
Abs Immature Granulocytes: 0.03 10*3/uL (ref 0.00–0.07)
Basophils Absolute: 0.1 10*3/uL (ref 0.0–0.1)
Basophils Relative: 1 %
Eosinophils Absolute: 0.1 10*3/uL (ref 0.0–0.5)
Eosinophils Relative: 2 %
HCT: 39.6 % (ref 36.0–46.0)
Hemoglobin: 13.7 g/dL (ref 12.0–15.0)
Immature Granulocytes: 0 %
Lymphocytes Relative: 38 %
Lymphs Abs: 3.5 10*3/uL (ref 0.7–4.0)
MCH: 31.8 pg (ref 26.0–34.0)
MCHC: 34.6 g/dL (ref 30.0–36.0)
MCV: 91.9 fL (ref 80.0–100.0)
Monocytes Absolute: 0.6 10*3/uL (ref 0.1–1.0)
Monocytes Relative: 7 %
Neutro Abs: 5 10*3/uL (ref 1.7–7.7)
Neutrophils Relative %: 52 %
Platelets: 244 10*3/uL (ref 150–400)
RBC: 4.31 MIL/uL (ref 3.87–5.11)
RDW: 12.9 % (ref 11.5–15.5)
WBC: 9.3 10*3/uL (ref 4.0–10.5)
nRBC: 0 % (ref 0.0–0.2)

## 2020-09-22 LAB — COMPREHENSIVE METABOLIC PANEL
ALT: 19 U/L (ref 0–44)
AST: 16 U/L (ref 15–41)
Albumin: 3.8 g/dL (ref 3.5–5.0)
Alkaline Phosphatase: 88 U/L (ref 38–126)
Anion gap: 10 (ref 5–15)
BUN: 13 mg/dL (ref 6–20)
CO2: 28 mmol/L (ref 22–32)
Calcium: 9.6 mg/dL (ref 8.9–10.3)
Chloride: 105 mmol/L (ref 98–111)
Creatinine, Ser: 0.78 mg/dL (ref 0.44–1.00)
GFR, Estimated: >60 mL/min (ref >60)
Glucose, Bld: 91 mg/dL (ref 70–99)
Potassium: 3.9 mmol/L (ref 3.5–5.1)
Sodium: 143 mmol/L (ref 135–145)
Total Bilirubin: 0.9 mg/dL (ref 0.3–1.2)
Total Protein: 6.9 g/dL (ref 6.5–8.1)

## 2020-09-22 MED ORDER — DENOSUMAB 60 MG/ML ~~LOC~~ SOSY
60.0000 mg | PREFILLED_SYRINGE | Freq: Once | SUBCUTANEOUS | Status: AC
  Administered 2020-09-22: 60 mg via SUBCUTANEOUS

## 2020-09-22 MED ORDER — DENOSUMAB 60 MG/ML ~~LOC~~ SOSY
PREFILLED_SYRINGE | SUBCUTANEOUS | Status: AC
  Filled 2020-09-22: qty 1

## 2020-09-22 NOTE — Patient Instructions (Signed)
Denosumab injection What is this medication? DENOSUMAB (den oh sue mab) slows bone breakdown. Prolia is used to treat osteoporosis in women after menopause and in men, and in people who are taking corticosteroids for 6 months or more. Delton See is used to treat a high calcium level due to cancer and to prevent bone fractures and other bone problems caused by multiple myeloma or cancer bone metastases. Delton See is also used totreat giant cell tumor of the bone. This medicine may be used for other purposes; ask your health care provider orpharmacist if you have questions. COMMON BRAND NAME(S): Prolia, XGEVA What should I tell my care team before I take this medication? They need to know if you have any of these conditions: dental disease having surgery or tooth extraction infection kidney disease low levels of calcium or Vitamin D in the blood malnutrition on hemodialysis skin conditions or sensitivity thyroid or parathyroid disease an unusual reaction to denosumab, other medicines, foods, dyes, or preservatives pregnant or trying to get pregnant breast-feeding How should I use this medication? This medicine is for injection under the skin. It is given by a health careprofessional in a hospital or clinic setting. A special MedGuide will be given to you before each treatment. Be sure to readthis information carefully each time. For Prolia, talk to your pediatrician regarding the use of this medicine in children. Special care may be needed. For Delton See, talk to your pediatrician regarding the use of this medicine in children. While this drug may be prescribed for children as young as 13 years for selected conditions,precautions do apply. Overdosage: If you think you have taken too much of this medicine contact apoison control center or emergency room at once. NOTE: This medicine is only for you. Do not share this medicine with others. What if I miss a dose? It is important not to miss your dose. Call  your doctor or health careprofessional if you are unable to keep an appointment. What may interact with this medication? Do not take this medicine with any of the following medications: other medicines containing denosumab This medicine may also interact with the following medications: medicines that lower your chance of fighting infection steroid medicines like prednisone or cortisone This list may not describe all possible interactions. Give your health care provider a list of all the medicines, herbs, non-prescription drugs, or dietary supplements you use. Also tell them if you smoke, drink alcohol, or use illegaldrugs. Some items may interact with your medicine. What should I watch for while using this medication? Visit your doctor or health care professional for regular checks on your progress. Your doctor or health care professional may order blood tests andother tests to see how you are doing. Call your doctor or health care professional for advice if you get a fever, chills or sore throat, or other symptoms of a cold or flu. Do not treat yourself. This drug may decrease your body's ability to fight infection. Try toavoid being around people who are sick. You should make sure you get enough calcium and vitamin D while you are taking this medicine, unless your doctor tells you not to. Discuss the foods you eatand the vitamins you take with your health care professional. See your dentist regularly. Brush and floss your teeth as directed. Before youhave any dental work done, tell your dentist you are receiving this medicine. Do not become pregnant while taking this medicine or for 5 months after stopping it. Talk with your doctor or health care professional about your  birth control options while taking this medicine. Women should inform their doctor if they wish to become pregnant or think they might be pregnant. There is a potential for serious side effects to an unborn child. Talk to your health  careprofessional or pharmacist for more information. What side effects may I notice from receiving this medication? Side effects that you should report to your doctor or health care professionalas soon as possible: allergic reactions like skin rash, itching or hives, swelling of the face, lips, or tongue bone pain breathing problems dizziness jaw pain, especially after dental work redness, blistering, peeling of the skin signs and symptoms of infection like fever or chills; cough; sore throat; pain or trouble passing urine signs of low calcium like fast heartbeat, muscle cramps or muscle pain; pain, tingling, numbness in the hands or feet; seizures unusual bleeding or bruising unusually weak or tired Side effects that usually do not require medical attention (report to yourdoctor or health care professional if they continue or are bothersome): constipation diarrhea headache joint pain loss of appetite muscle pain runny nose tiredness upset stomach This list may not describe all possible side effects. Call your doctor for medical advice about side effects. You may report side effects to FDA at1-800-FDA-1088. Where should I keep my medication? This medicine is only given in a clinic, doctor's office, or other health caresetting and will not be stored at home. NOTE: This sheet is a summary. It may not cover all possible information. If you have questions about this medicine, talk to your doctor, pharmacist, orhealth care provider.  2022 Elsevier/Gold Standard (2017-07-12 16:10:44)

## 2020-09-23 ENCOUNTER — Encounter: Payer: Self-pay | Admitting: Adult Health

## 2020-10-27 ENCOUNTER — Other Ambulatory Visit: Payer: Self-pay | Admitting: Oncology

## 2020-12-28 ENCOUNTER — Other Ambulatory Visit: Payer: Self-pay | Admitting: Oncology

## 2020-12-28 DIAGNOSIS — Z1231 Encounter for screening mammogram for malignant neoplasm of breast: Secondary | ICD-10-CM

## 2021-01-27 ENCOUNTER — Ambulatory Visit: Payer: BC Managed Care – PPO

## 2021-02-02 ENCOUNTER — Other Ambulatory Visit: Payer: Self-pay | Admitting: Oncology

## 2021-02-23 ENCOUNTER — Ambulatory Visit
Admission: RE | Admit: 2021-02-23 | Discharge: 2021-02-23 | Disposition: A | Discharge status: Home / Self Care | Payer: BC Managed Care – PPO | Source: Ambulatory Visit | Attending: Oncology | Admitting: Oncology

## 2021-02-23 ENCOUNTER — Other Ambulatory Visit: Payer: Self-pay

## 2021-02-23 DIAGNOSIS — Z1231 Encounter for screening mammogram for malignant neoplasm of breast: Secondary | ICD-10-CM

## 2021-03-15 ENCOUNTER — Other Ambulatory Visit: Payer: Self-pay | Admitting: "Endocrinology

## 2021-03-15 DIAGNOSIS — E119 Type 2 diabetes mellitus without complications: Secondary | ICD-10-CM

## 2021-03-23 ENCOUNTER — Inpatient Hospital Stay: Payer: BC Managed Care – PPO

## 2021-03-23 ENCOUNTER — Inpatient Hospital Stay: Payer: BC Managed Care – PPO | Attending: Adult Health

## 2021-03-23 ENCOUNTER — Telehealth: Payer: Self-pay

## 2021-03-23 ENCOUNTER — Other Ambulatory Visit: Payer: Self-pay

## 2021-03-23 VITALS — BP 141/82 | HR 74 | Temp 98.3°F | Resp 20

## 2021-03-23 DIAGNOSIS — C50312 Malignant neoplasm of lower-inner quadrant of left female breast: Secondary | ICD-10-CM

## 2021-03-23 DIAGNOSIS — Z170 Estrogen receptor positive status [ER+]: Secondary | ICD-10-CM | POA: Insufficient documentation

## 2021-03-23 LAB — CBC WITH DIFFERENTIAL/PLATELET
Abs Immature Granulocytes: 0.04 10*3/uL (ref 0.00–0.07)
Basophils Absolute: 0 10*3/uL (ref 0.0–0.1)
Basophils Relative: 1 %
Eosinophils Absolute: 0.2 10*3/uL (ref 0.0–0.5)
Eosinophils Relative: 2 %
HCT: 38.1 % (ref 36.0–46.0)
Hemoglobin: 13.8 g/dL (ref 12.0–15.0)
Immature Granulocytes: 1 %
Lymphocytes Relative: 31 %
Lymphs Abs: 2.7 10*3/uL (ref 0.7–4.0)
MCH: 31.9 pg (ref 26.0–34.0)
MCHC: 36.2 g/dL — ABNORMAL HIGH (ref 30.0–36.0)
MCV: 88.2 fL (ref 80.0–100.0)
Monocytes Absolute: 0.5 10*3/uL (ref 0.1–1.0)
Monocytes Relative: 5 %
Neutro Abs: 5.3 10*3/uL (ref 1.7–7.7)
Neutrophils Relative %: 60 %
Platelets: 219 10*3/uL (ref 150–400)
RBC: 4.32 MIL/uL (ref 3.87–5.11)
RDW: 12.9 % (ref 11.5–15.5)
WBC: 8.7 10*3/uL (ref 4.0–10.5)
nRBC: 0 % (ref 0.0–0.2)

## 2021-03-23 LAB — COMPREHENSIVE METABOLIC PANEL
ALT: 20 U/L (ref 0–44)
AST: 15 U/L (ref 15–41)
Albumin: 3.9 g/dL (ref 3.5–5.0)
Alkaline Phosphatase: 57 U/L (ref 38–126)
Anion gap: 6 (ref 5–15)
BUN: 15 mg/dL (ref 6–20)
CO2: 28 mmol/L (ref 22–32)
Calcium: 9.4 mg/dL (ref 8.9–10.3)
Chloride: 101 mmol/L (ref 98–111)
Creatinine, Ser: 0.75 mg/dL (ref 0.44–1.00)
GFR, Estimated: >60 mL/min (ref >60)
Glucose, Bld: 334 mg/dL — ABNORMAL HIGH (ref 70–99)
Potassium: 4.1 mmol/L (ref 3.5–5.1)
Sodium: 135 mmol/L (ref 135–145)
Total Bilirubin: 0.9 mg/dL (ref 0.3–1.2)
Total Protein: 6.5 g/dL (ref 6.5–8.1)

## 2021-03-23 MED ORDER — DENOSUMAB 60 MG/ML ~~LOC~~ SOSY
60.0000 mg | PREFILLED_SYRINGE | Freq: Once | SUBCUTANEOUS | Status: AC
  Administered 2021-03-23: 60 mg via SUBCUTANEOUS
  Filled 2021-03-23: qty 1

## 2021-03-23 NOTE — Telephone Encounter (Signed)
Called and spoke to pt regarding lab work.  Per Mendel Ryder, blood sugar was elevated today.  Pt states she had a sugary coffee drink prior to coming in but would keep check on her blood sugar and work with her PCP on monitoring it as well.  I confirmed with pt that this is what we advise also.  Pt verbalized understanding and thanks.

## 2021-05-16 ENCOUNTER — Other Ambulatory Visit: Payer: Self-pay

## 2021-06-08 NOTE — Progress Notes (Signed)
? ?[name] December 28, 1975 [id number] ? ? ?History: 46 y.o. G1P1 presents for annual exam as a new patient. Hx of left breast cancer with lumpectomy and letrozole x 7 years. Reports having a 'bump' on her right labia/perineum that drained last week, seems to have healed on its own, was very painful at the time. ?She also c/o nocturnal enuresis for the past 3 years, noticed after finished treatment for breast cancer and having ovaries removed. She wears a depend and sleeps on a Chux pad every night. This is troublesome to her as she travels a lot for work. Denies taking any sleep medication. Did have a similar problem as a child. No stress or urge incontience. ? ?Mammogram 01/12/2020 no evidence of malignancy and breast density category C.   ?Hx of BSO and LEEP 2015 ?DEXA 09/06/2020 osteopenia. Prolia every 6 months since 01/2018 ? ? ?Obstetric History ?OB History  ?Gravida Para Term Preterm AB Living  ?1 1 0 1 0 1  ?SAB IAB Ectopic Multiple Live Births  ?0 0 0 0 1  ?  ?# Outcome Date GA Lbr Len/2nd Weight Sex Delivery Anes PTL Lv  ?1 Preterm           ? ? ? ?The following portions of the patient's history were reviewed and updated as appropriate: allergies, current medications, past family history, past medical history, past social history, past surgical history, and problem list. ? ?Review of Systems ?Pertinent items noted in HPI and remainder of comprehensive ROS otherwise negative.  ?Past medical history, past surgical history, family history and social history were all reviewed and documented in the EPIC chart. ? ?Exam: ? ?Vitals:  ? 06/09/21 1357  ?BP: 126/88  ?Weight: 239 lb (108.4 kg)  ?Height: '5\' 6"'$  (1.676 m)  ? ?Body mass index is 38.58 kg/m?. ? ?General appearance:  Normal ?Thyroid:  Symmetrical, normal in size, without palpable masses or nodularity. ?Respiratory ? Auscultation:  Clear without wheezing or rhonchi ?Cardiovascular ? Auscultation:  Regular rate, without rubs, murmurs or  gallops ? Edema/varicosities:  Not grossly evident ?Abdominal ? Soft,nontender, without masses, guarding or rebound. ? Liver/spleen:  No organomegaly noted ? Hernia:  None appreciated ? Skin ? Inspection:  Grossly normal ?Breasts: Examined lying and sitting.  ? Right: Without masses, retractions, nipple discharge or axillary adenopathy. ? ? Left: Without masses, retractions, nipple discharge or axillary adenopathy. ?Genitourinary  ? Inguinal/mons:  Normal without inguinal adenopathy ? External genitalia:  Normal appearing vulva with no masses, tenderness, or lesions ? BUS/Urethra/Skene's glands:  Normal ? Vagina:  Normal appearing with normal color and discharge, no lesions. Atrophy: mild. Normal vaginal tone.  ? Cervix:  Normal appearing without discharge or lesions ? Uterus:  Normal in size, shape and contour.  Midline and mobile, nontender ? Adnexa/parametria:  absent bilaterally ? Anus and perineum: Normal ?  ? ?Patient informed chaperone available to be present for breast and pelvic exam. Patient has requested no chaperone to be present. Patient has been advised what will be completed during breast and pelvic exam.  ? ?Assessment/Plan:   ?1. History of loop electrical excision procedure (LEEP) ?Pap with cotesting ?- Cytology - PAP( [place]) ? ?2. Well woman exam with routine gynecological exam ?Mammo up to date ?No evidence of vulvar cyst currently, if it returns contact office and will treat with antibiotic ? ?3. Urinary incontinence, nocturnal enuresis ?Urology referral ? ?Discussed SBE, colonoscopy and DEXA screening as directed. Recommend 140mns of exercise weekly, including weight bearing exercise. Encouraged  the use of seatbelts and sunscreen.  ?Return in 1 year for annual or sooner prn. ? ?Kerry Dory WHNP-BC, 2:46 PM 06/09/2021  ?

## 2021-06-09 ENCOUNTER — Encounter: Payer: Self-pay | Admitting: Radiology

## 2021-06-09 ENCOUNTER — Telehealth: Payer: Self-pay

## 2021-06-09 ENCOUNTER — Ambulatory Visit (INDEPENDENT_AMBULATORY_CARE_PROVIDER_SITE_OTHER): Payer: BC Managed Care – PPO | Admitting: Radiology

## 2021-06-09 ENCOUNTER — Other Ambulatory Visit (HOSPITAL_COMMUNITY)
Admission: RE | Admit: 2021-06-09 | Discharge: 2021-06-09 | Disposition: A | Discharge status: Home / Self Care | Payer: BC Managed Care – PPO | Source: Ambulatory Visit | Attending: Radiology | Admitting: Radiology

## 2021-06-09 ENCOUNTER — Other Ambulatory Visit: Payer: Self-pay

## 2021-06-09 VITALS — BP 126/88 | Ht 66.0 in | Wt 239.0 lb

## 2021-06-09 DIAGNOSIS — N3944 Nocturnal enuresis: Secondary | ICD-10-CM | POA: Diagnosis not present

## 2021-06-09 DIAGNOSIS — Z9889 Other specified postprocedural states: Secondary | ICD-10-CM | POA: Diagnosis not present

## 2021-06-09 DIAGNOSIS — R87613 High grade squamous intraepithelial lesion on cytologic smear of cervix (HGSIL): Secondary | ICD-10-CM | POA: Diagnosis not present

## 2021-06-09 DIAGNOSIS — Z1151 Encounter for screening for human papillomavirus (HPV): Secondary | ICD-10-CM | POA: Insufficient documentation

## 2021-06-09 DIAGNOSIS — Z01419 Encounter for gynecological examination (general) (routine) without abnormal findings: Secondary | ICD-10-CM

## 2021-06-09 NOTE — Telephone Encounter (Signed)
Kerry Dory, NP  P [facility] Triage ?Uro referral, nocturnal enuresis  ? ?Referral faxed to [facility].  ?

## 2021-06-14 ENCOUNTER — Other Ambulatory Visit: Payer: Self-pay | Admitting: *Deleted

## 2021-06-14 DIAGNOSIS — R87810 Cervical high risk human papillomavirus (HPV) DNA test positive: Secondary | ICD-10-CM

## 2021-06-14 DIAGNOSIS — R87613 High grade squamous intraepithelial lesion on cytologic smear of cervix (HGSIL): Secondary | ICD-10-CM

## 2021-06-14 LAB — CYTOLOGY - PAP
Comment: NEGATIVE
Comment: NEGATIVE
Comment: NEGATIVE
Diagnosis: HIGH — AB
HPV 16: POSITIVE — AB
HPV 18 / 45: NEGATIVE
High risk HPV: POSITIVE — AB

## 2021-06-16 NOTE — Telephone Encounter (Signed)
Patient scheduled on 07/10/21 @ 1:15 with Dr.Herrick.  ?

## 2021-06-21 ENCOUNTER — Telehealth: Payer: Self-pay | Admitting: Adult Health

## 2021-06-21 NOTE — Telephone Encounter (Signed)
Rescheduled appointment per provider template. Left message. Patient will be mailed an updated calendar. ?

## 2021-06-22 ENCOUNTER — Ambulatory Visit: Payer: BC Managed Care – PPO | Admitting: Radiology

## 2021-06-22 ENCOUNTER — Ambulatory Visit: Payer: BC Managed Care – PPO

## 2021-06-30 ENCOUNTER — Ambulatory Visit: Payer: BC Managed Care – PPO | Admitting: Obstetrics and Gynecology

## 2021-07-13 ENCOUNTER — Encounter: Payer: Self-pay | Admitting: Obstetrics & Gynecology

## 2021-07-13 ENCOUNTER — Other Ambulatory Visit (HOSPITAL_COMMUNITY)
Admission: RE | Admit: 2021-07-13 | Discharge: 2021-07-13 | Disposition: A | Discharge status: Home / Self Care | Payer: BC Managed Care – PPO | Source: Ambulatory Visit | Attending: Radiology | Admitting: Radiology

## 2021-07-13 ENCOUNTER — Ambulatory Visit (INDEPENDENT_AMBULATORY_CARE_PROVIDER_SITE_OTHER): Payer: BC Managed Care – PPO | Admitting: Obstetrics & Gynecology

## 2021-07-13 VITALS — BP 124/90

## 2021-07-13 DIAGNOSIS — R87810 Cervical high risk human papillomavirus (HPV) DNA test positive: Secondary | ICD-10-CM | POA: Diagnosis present

## 2021-07-13 DIAGNOSIS — R87613 High grade squamous intraepithelial lesion on cytologic smear of cervix (HGSIL): Secondary | ICD-10-CM

## 2021-07-13 DIAGNOSIS — Z9889 Other specified postprocedural states: Secondary | ICD-10-CM

## 2021-07-13 HISTORY — PX: COLPOSCOPY: SHX161

## 2021-07-13 NOTE — Progress Notes (Signed)
? ? ?  [name] May 03, 1975 [id number] ? ? ?     46 y.o.  [name]  ? ?RP: HGSIL/HPV HR Pos 06/09/2021 for Colposcopy ? ?HPI: PAP: 06-09-21 HGSIL HPV HR +, 16+, 18/45 neg.  H/O LEEP in 2015.  Hx of left breast cancer with lumpectomy and letrozole x 7 years. ? ? ?OB History  ?Gravida Para Term Preterm AB Living  ?1 1 0 1 0 1  ?SAB IAB Ectopic Multiple Live Births  ?0 0 0 0 1  ?  ?# Outcome Date GA Lbr Len/2nd Weight Sex Delivery Anes PTL Lv  ?1 Preterm           ? ? ?Past medical history,surgical history, problem list, medications, allergies, family history and social history were all reviewed and documented in the EPIC chart. ? ? ?Directed ROS with pertinent positives and negatives documented in the history of present illness/assessment and plan. ? ?Exam: ? ?Vitals:  ? 07/13/21 1359  ?BP: 124/90  ? ?General appearance:  Normal ? ?Colposcopy Procedure Note ?Awa L.D. Gruenewald ?07/13/2021 ? ?Indications: ? ?Procedure Details  ?The risks and benefits of the procedure and Written informed consent obtained. ? ?Speculum placed in vagina and excellent visualization of cervix achieved, cervix swabbed x 3 with acetic acid solution. ? ?Findings: ? ?Cervix colposcopy: Physical Exam ?Genitourinary: ? ? ? ?  ? ?Vaginal colposcopy: Normal ? ?Vulvar colposcopy: Normal ? ?Perirectal colposcopy: Normal ? ?The cervix was sprayed with Hurricane before performing the cervical biopsies. ? ?Specimens: Cervical Bxs at 1 and 3 O'Clock. ? ?Complications: None, Silver Nitrate for Hemostasis. ?. ?Plan: Management per Cervical Bx results ? ? ?Assessment/Plan:  46 y.o. [name]  ? ?1. HSIL (high grade squamous intraepithelial lesion) on Pap smear of cervix ?PAP: 06-09-21 HGSIL HPV HR +, 16+, 18/45 neg.  H/O LEEP in 2015.  Hx of left breast cancer with lumpectomy and letrozole x 7 years.  Colposcopy procedure discussed, findings reviewed thoroughly with patient.  Pending cervical Bxs.  Given the HGSIL/HPV 16+, a LEEP will probably be needed. ?-  Colposcopy ?- Surgical pathology( [facility]/ [facility]) ? ?2. 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 ?- Colposcopy ?- Surgical pathology( [place]) ? ?3. H/O LEEP ? ?Other orders ?- desmopressin (DDAVP) 0.2 MG tablet; Take 200 mcg by mouth at bedtime. (Patient not taking: Reported on 07/13/2021)  ? ?[name], 2:22 PM 07/13/2021 ? ? ? ?  ?

## 2021-07-17 LAB — SURGICAL PATHOLOGY

## 2021-07-18 ENCOUNTER — Encounter: Payer: Self-pay | Admitting: Obstetrics & Gynecology

## 2021-07-19 ENCOUNTER — Telehealth: Payer: Self-pay

## 2021-07-19 NOTE — Telephone Encounter (Signed)
Pt notified of colpo results and recommendations by ML. Pt states she does not desire a repeat LEEP. She would prefer the hysterectomy. States she lives over an hour away so is not convenient for her to come for OV to discuss. Would like to move forward with hysterectomy if OK. Pt advised that there definitely would be a needed pre-op visit if ML agrees. Pt voiced understanding. Please advise.  ?

## 2021-07-20 ENCOUNTER — Other Ambulatory Visit: Payer: Self-pay

## 2021-07-20 DIAGNOSIS — D219 Benign neoplasm of connective and other soft tissue, unspecified: Secondary | ICD-10-CM

## 2021-07-20 NOTE — Telephone Encounter (Signed)
-----   Message from Princess Bruins, MD sent at 07/19/2021  4:16 PM EDT ----- ?Regarding: Schedule Surgery ?Surgery: CPT [phone] - XI Robotic Total Laparoscopic Hysterectomy with Salpingectomy  ? ?Diagnosis: Severe Dysplasia/Previous LEEP, D25.9 Fibroids ? ?Location: [place] ? ?Status: Outpatient ? ?Time: 120 Minutes ? ?Assistant: First Available Provider ? ?Urgency: First Available ? ?Pre-Op Appointment: To Be Scheduled ? ?Post-Op Appointment(s): 2 Weeks, 6 Weeks ? ?Time Out Of Work: 6 Weeks  ? ?

## 2021-07-20 NOTE — Telephone Encounter (Signed)
Call to patient. Per DPR, OK to leave message on voicemail.   Left voicemail requesting a return call to review benefits for Recommended Surgery with Marie-Lyne Lavoie, MD, FACOG, FRCSC, MA.  

## 2021-07-20 NOTE — Telephone Encounter (Signed)
Per ML: ?"Will schedule a XI Robotic Total Laparoscopic Hysterectomy with Bilateral Salpingectomy. I reviewed her chart and I have no recent imaging of her pelvis.  A CT scan of the abdomen and pelvis in 2014 showed Fibroids.  I recommend a Pre-op visit combined with a Pelvic US to make sure that we have all the information necessary to plan the right surgery and avoid surprises." ? ?Pt notified and voiced understanding. Will send msg to scheduling.  ? ? ?

## 2021-07-21 NOTE — Telephone Encounter (Signed)
Spoke with patient. Reviewed surgery dates. Patient request to proceed with surgery on 09/12/21.   I will return call once surgery date and time confirmed. Patient verbalizes understanding and is agreeable.  ? ?PUS scheduled for 08/31/21 at 4pm with consult at 4:30pm.  ? ?Surgery request sent.  ? ?

## 2021-07-26 NOTE — Telephone Encounter (Signed)
Spoke with patient. Surgery date request confirmed.  ?Advised surgery is scheduled for 09/12/21, [place] at 0730.  ?Surgery instruction sheet and hospital brochure reviewed, printed copy will be mailed.  ?Patient verbalizes understanding and is agreeable.  ?Patient notified of benefits per business office.  ?Routing to provider. Encounter closed.  ? ?Cc: Hayley C., KimAlexis ? ? ?

## 2021-08-11 NOTE — Telephone Encounter (Signed)
Surgery check list scanned in chart.Encounter closed 

## 2021-08-17 DIAGNOSIS — C539 Malignant neoplasm of cervix uteri, unspecified: Secondary | ICD-10-CM

## 2021-08-17 HISTORY — DX: Malignant neoplasm of cervix uteri, unspecified: C53.9

## 2021-08-31 ENCOUNTER — Ambulatory Visit (INDEPENDENT_AMBULATORY_CARE_PROVIDER_SITE_OTHER): Payer: BC Managed Care – PPO

## 2021-08-31 ENCOUNTER — Other Ambulatory Visit: Payer: BC Managed Care – PPO | Admitting: Obstetrics & Gynecology

## 2021-08-31 ENCOUNTER — Ambulatory Visit (INDEPENDENT_AMBULATORY_CARE_PROVIDER_SITE_OTHER): Payer: BC Managed Care – PPO | Admitting: Obstetrics & Gynecology

## 2021-08-31 ENCOUNTER — Encounter: Payer: Self-pay | Admitting: Obstetrics & Gynecology

## 2021-08-31 ENCOUNTER — Other Ambulatory Visit: Payer: BC Managed Care – PPO

## 2021-08-31 VITALS — BP 130/90 | HR 92 | Ht 65.75 in | Wt 244.0 lb

## 2021-08-31 DIAGNOSIS — D219 Benign neoplasm of connective and other soft tissue, unspecified: Secondary | ICD-10-CM | POA: Diagnosis not present

## 2021-08-31 DIAGNOSIS — D069 Carcinoma in situ of cervix, unspecified: Secondary | ICD-10-CM | POA: Diagnosis not present

## 2021-08-31 DIAGNOSIS — C50312 Malignant neoplasm of lower-inner quadrant of left female breast: Secondary | ICD-10-CM

## 2021-08-31 DIAGNOSIS — Z9889 Other specified postprocedural states: Secondary | ICD-10-CM

## 2021-08-31 DIAGNOSIS — R87810 Cervical high risk human papillomavirus (HPV) DNA test positive: Secondary | ICD-10-CM

## 2021-09-04 ENCOUNTER — Encounter (HOSPITAL_BASED_OUTPATIENT_CLINIC_OR_DEPARTMENT_OTHER): Payer: Self-pay | Admitting: Obstetrics & Gynecology

## 2021-09-06 ENCOUNTER — Encounter (HOSPITAL_BASED_OUTPATIENT_CLINIC_OR_DEPARTMENT_OTHER): Payer: Self-pay | Admitting: Obstetrics & Gynecology

## 2021-09-06 ENCOUNTER — Other Ambulatory Visit: Payer: Self-pay

## 2021-09-06 NOTE — Progress Notes (Signed)
Spoke w/ via phone for pre-op interview---[name] needs dos---- none              Cox results------09/08/21 [facility] appt for CBC, BMP, type & screen, EKG COVID test -----patient states asymptomatic no test needed Arrive at -------0530 on Tuesday, 09/12/21 NPO after MN NO Solid Food.  Clear liquids from MN until---0430 Med rec completed Medications to take morning of surgery -----none Diabetic medication -----Hold Metformin morning of surgery. Patient instructed no nail polish to be worn day of surgery Patient instructed to bring photo id and insurance card day of surgery Patient aware to have Driver (ride ) / caregiver    for 24 hours after surgery - mom, [name] Patient Special Instructions -----Extended recovery instructions given. Pre-Op special Istructions -----Patient is aware that if she wears contact lenses they will have to be removed prior to going to OR. Patient states that she is a very hard IV stick. Patient verbalized understanding of instructions that were given at this phone interview. Patient denies shortness of breath, chest pain, fever, cough at this phone interview.

## 2021-09-06 NOTE — Progress Notes (Signed)
Your procedure is scheduled on Tuesday, 09/12/2021.  Report to [facility].   Call this number if you have problems the morning of surgery  :[phone].   OUR ADDRESS IS [address].  WE ARE LOCATED IN [facility].  PLEASE BRING YOUR INSURANCE CARD AND PHOTO ID DAY OF SURGERY.  ONLY 2 PEOPLE ARE ALLOWED IN  WAITING  ROOM.                                      REMEMBER:  DO NOT EAT FOOD, CANDY GUM OR MINTS  AFTER MIDNIGHT THE NIGHT BEFORE YOUR SURGERY . YOU MAY HAVE CLEAR LIQUIDS FROM MIDNIGHT THE NIGHT BEFORE YOUR SURGERY UNTIL  4:30 AM. NO CLEAR LIQUIDS AFTER   4:30 AM DAY OF SURGERY.  YOU MAY  BRUSH YOUR TEETH MORNING OF SURGERY AND RINSE YOUR MOUTH OUT, NO CHEWING GUM CANDY OR MINTS.     CLEAR LIQUID DIET   Foods Allowed                                                                     Foods Excluded  Coffee and tea, regular and decaf                             liquids that you cannot  Plain Jell-O                                                                   see through such as: Fruit ices (not with fruit pulp)                                     milk, soups, orange juice  Plain  Popsicles                                    All solid food Carbonated beverages, regular and diet                                    Cranberry, grape and apple juices Sports drinks like Gatorade _____________________________________________________________________     TAKE THESE MEDICATIONS MORNING OF SURGERY: NONE   UP TO 4 VISITORS  MAY VISIT IN THE EXTENDED RECOVERY ROOM UNTIL 800 PM ONLY.  ONE  VISITOR AGE 30 AND OVER MAY SPEND THE NIGHT AND MUST BE IN EXTENDED RECOVERY ROOM NO LATER THAN 800 PM . YOUR DISCHARGE TIME AFTER YOU SPEND THE NIGHT IS 900 AM THE MORNING AFTER YOUR SURGERY.  YOU MAY PACK A SMALL OVERNIGHT BAG WITH TOILETRIES FOR YOUR OVERNIGHT STAY IF YOU  WISH.  YOUR PRESCRIPTION MEDICATIONS WILL BE PROVIDED  DURING [place].                                      DO NOT WEAR JEWERLY, MAKE UP. DO NOT WEAR LOTIONS, POWDERS, PERFUMES OR NAIL POLISH ON YOUR FINGERNAILS. TOENAIL POLISH IS OK TO WEAR. DO NOT SHAVE FOR 48 HOURS PRIOR TO DAY OF SURGERY. MEN MAY SHAVE FACE AND NECK. CONTACTS, GLASSES, OR DENTURES MAY NOT BE WORN TO SURGERY.  REMEMBER: NO SMOKING, DRUGS OR ALCOHOL FOR 24 HOURS BEFORE YOUR SURGERY.                                    [facility] IS NOT RESPONSIBLE  FOR ANY BELONGINGS.                                                                    Marland Kitchen           [facility] - Preparing for Surgery Before surgery, you can play an important role.  Because skin is not sterile, your skin needs to be as free of germs as possible.  You can reduce the number of germs on your skin by washing with CHG (chlorahexidine gluconate) soap before surgery.  CHG is an antiseptic cleaner which kills germs and bonds with the skin to continue killing germs even after washing. Please DO NOT use if you have an allergy to CHG or antibacterial soaps.  If your skin becomes reddened/irritated stop using the CHG and inform your nurse when you arrive at Short Stay. Do not shave (including legs and underarms) for at least 48 hours prior to the first CHG shower.  You may shave your face/neck. Please follow these instructions carefully:  1.  Shower with CHG Soap the night before surgery and the  morning of Surgery.  2.  If you choose to wash your hair, wash your hair first as usual with your  normal  shampoo.  3.  After you shampoo, rinse your hair and body thoroughly to remove the  shampoo.                            4.  Use CHG as you would any other liquid soap.  You can apply chg directly  to the skin and wash , please wash your belly button thoroughly with chg soap provided night before and morning of your surgery.                     Gently with a scrungie or clean washcloth.  5.  Apply the CHG Soap to your  body ONLY FROM THE NECK DOWN.   Do not use on face/ open                           Wound or open sores. Avoid contact with eyes, ears mouth and genitals (private parts).  Wash face,  Genitals (private parts) with your normal soap.             6.  Wash thoroughly, paying special attention to the area where your surgery  will be performed.  7.  Thoroughly rinse your body with warm water from the neck down.  8.  DO NOT shower/wash with your normal soap after using and rinsing off  the CHG Soap.                9.  Pat yourself dry with a clean towel.            10.  Wear clean pajamas.            11.  Place clean sheets on your bed the night of your first shower and do not  sleep with pets. Day of Surgery : Do not apply any lotions/deodorants the morning of surgery.  Please wear clean clothes to the hospital/surgery center.  IF YOU HAVE ANY SKIN IRRITATION OR PROBLEMS WITH THE SURGICAL SOAP, PLEASE GET A BAR OF GOLD DIAL SOAP AND SHOWER THE NIGHT BEFORE YOUR SURGERY AND THE MORNING OF YOUR SURGERY. PLEASE LET THE NURSE KNOW MORNING OF YOUR SURGERY IF YOU HAD ANY PROBLEMS WITH THE SURGICAL SOAP.   ________________________________________________________________________                                                        QUESTIONS [facility] PRE OP NURSE PHONE [phone].

## 2021-09-08 ENCOUNTER — Encounter (HOSPITAL_COMMUNITY)
Admission: RE | Admit: 2021-09-08 | Discharge: 2021-09-08 | Disposition: A | Discharge status: Home / Self Care | Payer: BC Managed Care – PPO | Source: Ambulatory Visit | Attending: Obstetrics & Gynecology | Admitting: Obstetrics & Gynecology

## 2021-09-08 DIAGNOSIS — Z01818 Encounter for other preprocedural examination: Secondary | ICD-10-CM | POA: Diagnosis present

## 2021-09-08 LAB — CBC
HCT: 42.3 % (ref 36.0–46.0)
Hemoglobin: 14.4 g/dL (ref 12.0–15.0)
MCH: 31.8 pg (ref 26.0–34.0)
MCHC: 34 g/dL (ref 30.0–36.0)
MCV: 93.4 fL (ref 80.0–100.0)
Platelets: 235 10*3/uL (ref 150–400)
RBC: 4.53 MIL/uL (ref 3.87–5.11)
RDW: 12.8 % (ref 11.5–15.5)
WBC: 8.8 10*3/uL (ref 4.0–10.5)
nRBC: 0 % (ref 0.0–0.2)

## 2021-09-08 LAB — BASIC METABOLIC PANEL WITH GFR
Anion gap: 8 (ref 5–15)
BUN: 13 mg/dL (ref 6–20)
CO2: 27 mmol/L (ref 22–32)
Calcium: 9.5 mg/dL (ref 8.9–10.3)
Chloride: 103 mmol/L (ref 98–111)
Creatinine, Ser: 0.77 mg/dL (ref 0.44–1.00)
GFR, Estimated: >60 mL/min
Glucose, Bld: 210 mg/dL — ABNORMAL HIGH (ref 70–99)
Potassium: 3.8 mmol/L (ref 3.5–5.1)
Sodium: 138 mmol/L (ref 135–145)

## 2021-09-11 MED ORDER — GENTAMICIN SULFATE 40 MG/ML IJ SOLN
5.0000 mg/kg | INTRAVENOUS | Status: AC
  Administered 2021-09-12: 390 mg via INTRAVENOUS
  Filled 2021-09-11 (×2): qty 9.75

## 2021-09-11 MED ORDER — METRONIDAZOLE 500 MG/100ML IV SOLN
500.0000 mg | INTRAVENOUS | Status: AC
  Administered 2021-09-12: 500 mg via INTRAVENOUS

## 2021-09-12 ENCOUNTER — Other Ambulatory Visit: Payer: Self-pay

## 2021-09-12 ENCOUNTER — Ambulatory Visit (HOSPITAL_BASED_OUTPATIENT_CLINIC_OR_DEPARTMENT_OTHER): Payer: BC Managed Care – PPO | Admitting: Anesthesiology

## 2021-09-12 ENCOUNTER — Encounter (HOSPITAL_BASED_OUTPATIENT_CLINIC_OR_DEPARTMENT_OTHER): Payer: Self-pay | Admitting: Obstetrics & Gynecology

## 2021-09-12 ENCOUNTER — Encounter (HOSPITAL_BASED_OUTPATIENT_CLINIC_OR_DEPARTMENT_OTHER)
Admission: RE | Disposition: A | Discharge status: Home / Self Care | Payer: Self-pay | Source: Ambulatory Visit | Attending: Obstetrics & Gynecology

## 2021-09-12 ENCOUNTER — Ambulatory Visit (HOSPITAL_BASED_OUTPATIENT_CLINIC_OR_DEPARTMENT_OTHER)
Admission: RE | Admit: 2021-09-12 | Discharge: 2021-09-12 | Disposition: A | Discharge status: Home / Self Care | Payer: BC Managed Care – PPO | Source: Ambulatory Visit | Attending: Obstetrics & Gynecology | Admitting: Obstetrics & Gynecology

## 2021-09-12 ENCOUNTER — Ambulatory Visit (HOSPITAL_COMMUNITY): Payer: BC Managed Care – PPO

## 2021-09-12 DIAGNOSIS — R87613 High grade squamous intraepithelial lesion on cytologic smear of cervix (HGSIL): Secondary | ICD-10-CM

## 2021-09-12 DIAGNOSIS — Z7984 Long term (current) use of oral hypoglycemic drugs: Secondary | ICD-10-CM | POA: Insufficient documentation

## 2021-09-12 DIAGNOSIS — Z9889 Other specified postprocedural states: Secondary | ICD-10-CM

## 2021-09-12 DIAGNOSIS — F17200 Nicotine dependence, unspecified, uncomplicated: Secondary | ICD-10-CM | POA: Insufficient documentation

## 2021-09-12 DIAGNOSIS — Z853 Personal history of malignant neoplasm of breast: Secondary | ICD-10-CM | POA: Diagnosis not present

## 2021-09-12 DIAGNOSIS — E119 Type 2 diabetes mellitus without complications: Secondary | ICD-10-CM | POA: Insufficient documentation

## 2021-09-12 DIAGNOSIS — D259 Leiomyoma of uterus, unspecified: Secondary | ICD-10-CM | POA: Insufficient documentation

## 2021-09-12 DIAGNOSIS — I10 Essential (primary) hypertension: Secondary | ICD-10-CM | POA: Diagnosis not present

## 2021-09-12 DIAGNOSIS — Z86718 Personal history of other venous thrombosis and embolism: Secondary | ICD-10-CM | POA: Insufficient documentation

## 2021-09-12 DIAGNOSIS — D069 Carcinoma in situ of cervix, unspecified: Secondary | ICD-10-CM | POA: Diagnosis not present

## 2021-09-12 DIAGNOSIS — Z01818 Encounter for other preprocedural examination: Secondary | ICD-10-CM

## 2021-09-12 DIAGNOSIS — Z9221 Personal history of antineoplastic chemotherapy: Secondary | ICD-10-CM | POA: Insufficient documentation

## 2021-09-12 HISTORY — DX: Personal history of antineoplastic chemotherapy: Z92.21

## 2021-09-12 HISTORY — DX: Type 2 diabetes mellitus without complications: E11.9

## 2021-09-12 HISTORY — PX: ABDOMINAL HYSTERECTOMY: SHX81

## 2021-09-12 HISTORY — PX: ROBOTIC ASSISTED LAPAROSCOPIC HYSTERECTOMY AND SALPINGECTOMY: SHX6379

## 2021-09-12 HISTORY — DX: Polyneuropathy, unspecified: G62.9

## 2021-09-12 LAB — CBC
HCT: 41 % (ref 36.0–46.0)
Hemoglobin: 14 g/dL (ref 12.0–15.0)
MCH: 32.1 pg (ref 26.0–34.0)
MCHC: 34.1 g/dL (ref 30.0–36.0)
MCV: 94 fL (ref 80.0–100.0)
Platelets: 227 10*3/uL (ref 150–400)
RBC: 4.36 MIL/uL (ref 3.87–5.11)
RDW: 13 % (ref 11.5–15.5)
WBC: 17.9 10*3/uL — ABNORMAL HIGH (ref 4.0–10.5)
nRBC: 0 % (ref 0.0–0.2)

## 2021-09-12 LAB — GLUCOSE, CAPILLARY
Glucose-Capillary: 223 mg/dL — ABNORMAL HIGH (ref 70–99)
Glucose-Capillary: 278 mg/dL — ABNORMAL HIGH (ref 70–99)

## 2021-09-12 LAB — TYPE AND SCREEN
ABO/RH(D): A POS
Antibody Screen: NEGATIVE

## 2021-09-12 SURGERY — XI ROBOTIC ASSISTED LAPAROSCOPIC HYSTERECTOMY AND SALPINGECTOMY
Anesthesia: General | Site: Abdomen | Laterality: Bilateral

## 2021-09-12 MED ORDER — MIDAZOLAM HCL 2 MG/2ML IJ SOLN
INTRAMUSCULAR | Status: DC | PRN
  Administered 2021-09-12: 2 mg via INTRAVENOUS

## 2021-09-12 MED ORDER — STERILE WATER FOR IRRIGATION IR SOLN
Status: DC | PRN
  Administered 2021-09-12: 500 mL

## 2021-09-12 MED ORDER — PROPOFOL 10 MG/ML IV BOLUS
INTRAVENOUS | Status: DC | PRN
  Administered 2021-09-12: 200 mg via INTRAVENOUS

## 2021-09-12 MED ORDER — HYDROCODONE-ACETAMINOPHEN 5-325 MG PO TABS: 1.0000 | ORAL_TABLET | ORAL | Status: DC | PRN

## 2021-09-12 MED ORDER — OXYCODONE-ACETAMINOPHEN 5-325 MG PO TABS
ORAL_TABLET | ORAL | Status: AC
  Filled 2021-09-12: qty 2

## 2021-09-12 MED ORDER — DROPERIDOL 2.5 MG/ML IJ SOLN
0.6250 mg | Freq: Once | INTRAMUSCULAR | Status: DC | PRN
Start: 2021-09-12 — End: 2021-09-12

## 2021-09-12 MED ORDER — POVIDONE-IODINE 10 % EX SWAB: 2.0000 | Freq: Once | CUTANEOUS | Status: DC

## 2021-09-12 MED ORDER — ACETAMINOPHEN 325 MG PO TABS: 650.0000 mg | ORAL_TABLET | ORAL | Status: DC | PRN

## 2021-09-12 MED ORDER — PROPOFOL 10 MG/ML IV BOLUS
INTRAVENOUS | Status: AC
  Filled 2021-09-12: qty 20

## 2021-09-12 MED ORDER — HEMOSTATIC AGENTS (NO CHARGE) OPTIME
TOPICAL | Status: DC | PRN
  Administered 2021-09-12: 1 via TOPICAL

## 2021-09-12 MED ORDER — MEPERIDINE HCL 25 MG/ML IJ SOLN
INTRAMUSCULAR | Status: AC
  Filled 2021-09-12: qty 1

## 2021-09-12 MED ORDER — LACTATED RINGERS IV SOLN: INTRAVENOUS | Status: DC

## 2021-09-12 MED ORDER — SCOPOLAMINE 1 MG/3DAYS TD PT72
1.0000 | MEDICATED_PATCH | Freq: Once | TRANSDERMAL | Status: DC
  Administered 2021-09-12: 1.5 mg via TRANSDERMAL

## 2021-09-12 MED ORDER — SCOPOLAMINE 1 MG/3DAYS TD PT72
MEDICATED_PATCH | TRANSDERMAL | Status: AC
  Filled 2021-09-12: qty 1

## 2021-09-12 MED ORDER — ROCURONIUM BROMIDE 10 MG/ML (PF) SYRINGE
PREFILLED_SYRINGE | INTRAVENOUS | Status: AC
  Filled 2021-09-12: qty 10

## 2021-09-12 MED ORDER — FENTANYL CITRATE (PF) 100 MCG/2ML IJ SOLN
INTRAMUSCULAR | Status: DC | PRN
Start: 2021-09-12 — End: 2021-09-12
  Administered 2021-09-12: 100 ug via INTRAVENOUS
  Administered 2021-09-12: 50 ug via INTRAVENOUS

## 2021-09-12 MED ORDER — LIDOCAINE 2% (20 MG/ML) 5 ML SYRINGE
INTRAMUSCULAR | Status: DC | PRN
  Administered 2021-09-12: 100 mg via INTRAVENOUS

## 2021-09-12 MED ORDER — ACETAMINOPHEN 500 MG PO TABS
1000.0000 mg | ORAL_TABLET | Freq: Once | ORAL | Status: AC
  Administered 2021-09-12: 1000 mg via ORAL

## 2021-09-12 MED ORDER — LIDOCAINE HCL (PF) 2 % IJ SOLN
INTRAMUSCULAR | Status: AC
  Filled 2021-09-12: qty 5

## 2021-09-12 MED ORDER — KETOROLAC TROMETHAMINE 30 MG/ML IJ SOLN
INTRAMUSCULAR | Status: AC
  Filled 2021-09-12: qty 1

## 2021-09-12 MED ORDER — MIDAZOLAM HCL 2 MG/2ML IJ SOLN
INTRAMUSCULAR | Status: AC
  Filled 2021-09-12: qty 2

## 2021-09-12 MED ORDER — OXYCODONE-ACETAMINOPHEN 5-325 MG PO TABS
2.0000 | ORAL_TABLET | ORAL | Status: DC | PRN
  Administered 2021-09-12 (×2): 2 via ORAL

## 2021-09-12 MED ORDER — FENTANYL CITRATE (PF) 250 MCG/5ML IJ SOLN
INTRAMUSCULAR | Status: AC
  Filled 2021-09-12: qty 5

## 2021-09-12 MED ORDER — ONDANSETRON HCL 4 MG/2ML IJ SOLN
INTRAMUSCULAR | Status: DC | PRN
  Administered 2021-09-12: 4 mg via INTRAVENOUS

## 2021-09-12 MED ORDER — PHENYLEPHRINE 80 MCG/ML (10ML) SYRINGE FOR IV PUSH (FOR BLOOD PRESSURE SUPPORT)
PREFILLED_SYRINGE | INTRAVENOUS | Status: AC
  Filled 2021-09-12: qty 10

## 2021-09-12 MED ORDER — KETAMINE HCL 50 MG/5ML IJ SOSY
PREFILLED_SYRINGE | INTRAMUSCULAR | Status: AC
  Filled 2021-09-12: qty 5

## 2021-09-12 MED ORDER — HYDROMORPHONE HCL 1 MG/ML IJ SOLN
0.2500 mg | INTRAMUSCULAR | Status: DC | PRN
  Administered 2021-09-12 (×2): 0.25 mg via INTRAVENOUS

## 2021-09-12 MED ORDER — OXYCODONE-ACETAMINOPHEN 7.5-325 MG PO TABS: 1.0000 | ORAL_TABLET | Freq: Four times a day (QID) | ORAL | 0 refills | Status: AC | PRN

## 2021-09-12 MED ORDER — DEXAMETHASONE SODIUM PHOSPHATE 10 MG/ML IJ SOLN
INTRAMUSCULAR | Status: DC | PRN
  Administered 2021-09-12: 4 mg via INTRAVENOUS

## 2021-09-12 MED ORDER — ACETAMINOPHEN 500 MG PO TABS
ORAL_TABLET | ORAL | Status: AC
  Filled 2021-09-12: qty 2

## 2021-09-12 MED ORDER — BUPIVACAINE HCL (PF) 0.25 % IJ SOLN
INTRAMUSCULAR | Status: DC | PRN
  Administered 2021-09-12: 20 mL

## 2021-09-12 MED ORDER — SUGAMMADEX SODIUM 200 MG/2ML IV SOLN
INTRAVENOUS | Status: DC | PRN
  Administered 2021-09-12: 200 mg via INTRAVENOUS

## 2021-09-12 MED ORDER — DEXAMETHASONE SODIUM PHOSPHATE 10 MG/ML IJ SOLN
INTRAMUSCULAR | Status: AC
  Filled 2021-09-12: qty 1

## 2021-09-12 MED ORDER — POVIDONE-IODINE 10 % EX SWAB: 2.0000 "application " | Freq: Once | CUTANEOUS | Status: DC

## 2021-09-12 MED ORDER — HYDROMORPHONE HCL 1 MG/ML IJ SOLN
INTRAMUSCULAR | Status: AC
  Filled 2021-09-12: qty 1

## 2021-09-12 MED ORDER — SODIUM CHLORIDE 0.9 % IR SOLN
Status: DC | PRN
  Administered 2021-09-12: 1000 mL
  Administered 2021-09-12: 100 mL

## 2021-09-12 MED ORDER — ROCURONIUM BROMIDE 10 MG/ML (PF) SYRINGE
PREFILLED_SYRINGE | INTRAVENOUS | Status: DC | PRN
  Administered 2021-09-12: 40 mg via INTRAVENOUS
  Administered 2021-09-12: 60 mg via INTRAVENOUS
  Administered 2021-09-12: 20 mg via INTRAVENOUS

## 2021-09-12 MED ORDER — METRONIDAZOLE 500 MG/100ML IV SOLN
INTRAVENOUS | Status: AC
  Filled 2021-09-12: qty 100

## 2021-09-12 MED ORDER — PHENYLEPHRINE 80 MCG/ML (10ML) SYRINGE FOR IV PUSH (FOR BLOOD PRESSURE SUPPORT)
PREFILLED_SYRINGE | INTRAVENOUS | Status: DC | PRN
  Administered 2021-09-12: 160 ug via INTRAVENOUS

## 2021-09-12 MED ORDER — KETAMINE HCL 10 MG/ML IJ SOLN
INTRAMUSCULAR | Status: DC | PRN
  Administered 2021-09-12: 30 mg via INTRAVENOUS

## 2021-09-12 MED ORDER — ONDANSETRON HCL 4 MG/2ML IJ SOLN
INTRAMUSCULAR | Status: AC
  Filled 2021-09-12: qty 2

## 2021-09-12 MED ORDER — KETOROLAC TROMETHAMINE 30 MG/ML IJ SOLN
INTRAMUSCULAR | Status: DC | PRN
  Administered 2021-09-12: 30 mg via INTRAVENOUS

## 2021-09-12 MED ORDER — MEPERIDINE HCL 25 MG/ML IJ SOLN
6.2500 mg | Freq: Once | INTRAMUSCULAR | Status: AC
  Administered 2021-09-12: 6.25 mg via INTRAVENOUS

## 2021-09-12 SURGICAL SUPPLY — 64 items
ADH SKN CLS APL DERMABOND .7 (GAUZE/BANDAGES/DRESSINGS) ×2
APL ESCP 73.6OZ SRGCL (TIP) ×1
CATH FOLEY 3WAY  5CC 16FR (CATHETERS) ×1
CATH FOLEY 3WAY 5CC 16FR (CATHETERS) ×1 IMPLANT
COVER BACK TABLE 60X90IN (DRAPES) ×2 IMPLANT
COVER TIP SHEARS 8 DVNC (MISCELLANEOUS) ×1 IMPLANT
COVER TIP SHEARS 8MM DA VINCI (MISCELLANEOUS) ×1
DEFOGGER SCOPE WARMER CLEARIFY (MISCELLANEOUS) ×2 IMPLANT
DERMABOND ADVANCED (GAUZE/BANDAGES/DRESSINGS) ×2
DERMABOND ADVANCED .7 DNX12 (GAUZE/BANDAGES/DRESSINGS) ×1 IMPLANT
DRAPE ARM DVNC X/XI (DISPOSABLE) ×4 IMPLANT
DRAPE COLUMN DVNC XI (DISPOSABLE) ×1 IMPLANT
DRAPE DA VINCI XI ARM (DISPOSABLE) ×4
DRAPE DA VINCI XI COLUMN (DISPOSABLE) ×1
DRAPE UTILITY XL STRL (DRAPES) ×2 IMPLANT
DURAPREP 26ML APPLICATOR (WOUND CARE) ×2 IMPLANT
ELECT REM PT RETURN 9FT ADLT (ELECTROSURGICAL) ×2
ELECTRODE REM PT RTRN 9FT ADLT (ELECTROSURGICAL) ×1 IMPLANT
GAUZE 4X4 16PLY ~~LOC~~+RFID DBL (SPONGE) ×4 IMPLANT
GAUZE VASELINE 3X9 (GAUZE/BANDAGES/DRESSINGS) ×2 IMPLANT
GLOVE BIO SURGEON STRL SZ 6.5 (GLOVE) ×6 IMPLANT
GLOVE BIO SURGEON STRL SZ7 (GLOVE) ×2 IMPLANT
GLOVE BIOGEL PI IND STRL 6.5 (GLOVE) IMPLANT
GLOVE BIOGEL PI IND STRL 7.0 (GLOVE) ×5 IMPLANT
GLOVE BIOGEL PI INDICATOR 6.5 (GLOVE) ×2
GLOVE BIOGEL PI INDICATOR 7.0 (GLOVE) ×7
GLOVE ECLIPSE 6.5 STRL STRAW (GLOVE) ×2 IMPLANT
GLOVE SURG UNDER POLY LF SZ7.5 (GLOVE) ×1 IMPLANT
HOLDER FOLEY CATH W/STRAP (MISCELLANEOUS) ×1 IMPLANT
IRRIG SUCT STRYKERFLOW 2 WTIP (MISCELLANEOUS) ×2
IRRIGATION SUCT STRKRFLW 2 WTP (MISCELLANEOUS) ×1 IMPLANT
IV NS 1000ML (IV SOLUTION) ×4
IV NS 1000ML BAXH (IV SOLUTION) IMPLANT
KIT TURNOVER CYSTO (KITS) ×2 IMPLANT
LEGGING LITHOTOMY PAIR STRL (DRAPES) ×2 IMPLANT
MANIFOLD NEPTUNE II (INSTRUMENTS) ×1 IMPLANT
OBTURATOR OPTICAL STANDARD 8MM (TROCAR) ×1
OBTURATOR OPTICAL STND 8 DVNC (TROCAR) ×1
OBTURATOR OPTICALSTD 8 DVNC (TROCAR) ×1 IMPLANT
OCCLUDER COLPOPNEUMO (BALLOONS) ×2 IMPLANT
PACK ROBOT WH (CUSTOM PROCEDURE TRAY) ×2 IMPLANT
PACK ROBOTIC GOWN (GOWN DISPOSABLE) ×2 IMPLANT
PACK TRENDGUARD 450 HYBRID PRO (MISCELLANEOUS) IMPLANT
PAD OB MATERNITY 4.3X12.25 (PERSONAL CARE ITEMS) ×2 IMPLANT
PAD PREP 24X48 CUFFED NSTRL (MISCELLANEOUS) ×2 IMPLANT
POWDER SURGICEL 3.0 GRAM (HEMOSTASIS) ×1 IMPLANT
PROTECTOR NERVE ULNAR (MISCELLANEOUS) ×4 IMPLANT
SEAL CANN UNIV 5-8 DVNC XI (MISCELLANEOUS) ×4 IMPLANT
SEAL XI 5MM-8MM UNIVERSAL (MISCELLANEOUS) ×4
SET IRRIG Y TYPE TUR BLADDER L (SET/KITS/TRAYS/PACK) ×1 IMPLANT
SET TRI-LUMEN FLTR TB AIRSEAL (TUBING) ×2 IMPLANT
SPIKE FLUID TRANSFER (MISCELLANEOUS) ×1 IMPLANT
SPONGE T-LAP 4X18 ~~LOC~~+RFID (SPONGE) ×1 IMPLANT
SUT VIC AB 4-0 PS2 27 (SUTURE) ×6 IMPLANT
SUT VICRYL 0 UR6 27IN ABS (SUTURE) ×2 IMPLANT
SUT VLOC 180 0 9IN  GS21 (SUTURE) ×1
SUT VLOC 180 0 9IN GS21 (SUTURE) ×1 IMPLANT
TIP ENDOSCOPIC SURGICEL (TIP) ×1 IMPLANT
TIP UTERINE 6.7X8CM BLUE DISP (MISCELLANEOUS) ×1 IMPLANT
TOWEL OR 17X26 10 PK STRL BLUE (TOWEL DISPOSABLE) ×2 IMPLANT
TRENDGUARD 450 HYBRID PRO PACK (MISCELLANEOUS) ×2
TROCAR PORT AIRSEAL 8X120 (TROCAR) ×2 IMPLANT
WATER STERILE IRR 1000ML POUR (IV SOLUTION) ×2 IMPLANT
WATER STERILE IRR 500ML POUR (IV SOLUTION) ×1 IMPLANT

## 2021-09-12 NOTE — Anesthesia Procedure Notes (Signed)
Procedure Name: Intubation Date/Time: 09/12/2021 7:48 AM  Performed by: Norva Pavlov, CRNAPre-anesthesia Checklist: Patient identified, Emergency Drugs available, Suction available and Patient being monitored Patient Re-evaluated:Patient Re-evaluated prior to induction Oxygen Delivery Method: Circle system utilized Preoxygenation: Pre-oxygenation with 100% oxygen Induction Type: IV induction Ventilation: Mask ventilation without difficulty Laryngoscope Size: Glidescope and 3 Grade View: Grade I Tube type: Oral Tube size: 7.0 mm Number of attempts: 1 Airway Equipment and Method: Oral airway and Rigid stylet Placement Confirmation: ETT inserted through vocal cords under direct vision, positive ETCO2 and breath sounds checked- equal and bilateral Secured at: 22 cm Tube secured with: Tape Dental Injury: Teeth and Oropharynx as per pre-operative assessment  Difficulty Due To: Difficulty was anticipated Comments: Known difficult airway in the past. Used glidescope on first attempt.

## 2021-09-13 ENCOUNTER — Encounter (HOSPITAL_BASED_OUTPATIENT_CLINIC_OR_DEPARTMENT_OTHER): Payer: Self-pay | Admitting: Obstetrics & Gynecology

## 2021-09-15 ENCOUNTER — Telehealth: Payer: Self-pay | Admitting: *Deleted

## 2021-09-15 ENCOUNTER — Encounter: Payer: Self-pay | Admitting: Obstetrics & Gynecology

## 2021-09-15 DIAGNOSIS — C539 Malignant neoplasm of cervix uteri, unspecified: Secondary | ICD-10-CM

## 2021-09-15 LAB — SURGICAL PATHOLOGY

## 2021-09-15 NOTE — Progress Notes (Signed)
    [name] [birth year] [id number]        46 y.o.  [name]   RP:  Preop recurrent Severe Cervical Dysplasia/HPV 16+ and Pelvic US  HPI:  PAP: 06-09-21 HGSIL HPV HR +, 16+, 18/45 neg.  Colpo 07/13/21 Severe cervical dysplasia.  H/O LEEP in 2015.  Hx of left breast cancer with lumpectomy and letrozole x 7 years.     OB History  Gravida Para Term Preterm AB Living  1 1 0 1 0 1  SAB IAB Ectopic Multiple Live Births  0 0 0 0 1    # Outcome Date GA Lbr Len/2nd Weight Sex Delivery Anes PTL Lv  1 Preterm             Past medical history,surgical history, problem list, medications, allergies, family history and social history were all reviewed and documented in the EPIC chart.   Directed ROS with pertinent positives and negatives documented in the history of present illness/assessment and plan.  Exam:  Vitals:   08/31/21 1617  BP: 130/90  Pulse: 92  SpO2: 97%  Weight: 244 lb (110.7 kg)  Height: 5' 5.75" (1.67 m)   General appearance:  Normal  Pelvic US today: T/V images.  Small retroverted uterus with several intramural fibroids, the largest measured at 1.2 cm.  The overall uterine size is measured at 6.42 x 3.74 x 3.54 cm.  Small amount of fluid in the endometrial cavity which outlines small loose thin walls with a combined thickness at 3.8 mm with no mass seen.  Both ovaries are surgically absent.  No pelvic mass or free fluid seen.   Assessment/Plan:  46 y.o. [name]   1. Severe cervical dysplasia PAP: 06-09-21 HGSIL HPV HR +, 16+, 18/45 neg.  Colpo 07/13/21 Severe cervical dysplasia.  H/O LEEP in 2015.  Pelvic US findings reviewed.  Counseling done.  Decision to proceed with XI Robotic TLH/BS (will remove the tubes if present, but probable BSO).  Preop preparation, Surgery and risks, postop precautions and expectations reviewed.  Patient voiced understanding and agreement with the plan.  2. 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  3.  H/O LEEP LEEP in 2015.  4. Primary cancer of lower-inner quadrant of left female breast ([place])   Hx of left breast cancer with lumpectomy and letrozole x 7 years.                          Patient was counseled as to the risk of surgery to include the following:  1. Infection (prohylactic antibiotics will be administered)  2. DVT/Pulmonary Embolism (prophylactic pneumo compression stockings will be used)  3.Trauma to internal organs requiring additional surgical procedure to repair any injury to internal organs requiring perhaps additional hospitalization days.  4.Hemmorhage requiring transfusion and blood products which carry risks such as anaphylactic reaction, hepatitis and AIDS  Patient had received literature information on the procedure scheduled and all her questions were answered and fully accepts all risk.    [name], 1:26 PM 09/15/2021

## 2021-09-15 NOTE — Telephone Encounter (Signed)
-----   Message from Princess Bruins, MD sent at 09/15/2021 10:27 AM EDT ----- Regarding: Refer to Gyn-Onco XI Robotic [place] on 09/12/21 for Severe Cervical Dysplasia.  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  9 mm in depth.  The cancer comes very close to the margin at < 1/2 mm.  Urgent referral to Gyn-Onco for counseling and further management and treatment. I am calling patient right now to let her know.

## 2021-09-15 NOTE — Telephone Encounter (Signed)
Referral placed at Gynecology/oncology they will call to schedule.

## 2021-09-18 ENCOUNTER — Telehealth: Payer: Self-pay | Admitting: *Deleted

## 2021-09-18 NOTE — Telephone Encounter (Signed)
Attempted to reach the patient to schedule a new patient appt with Dr Berline Lopes on 7/10. LMOM to the patient to call the office back

## 2021-09-20 ENCOUNTER — Encounter: Payer: Self-pay | Admitting: Adult Health

## 2021-09-20 NOTE — Telephone Encounter (Signed)
Spoke with [facility] APP, ok to cancel appt for tomorrow and will combine both med onc and gyn onc survivorship appts together later down the road. Spoke with the patient regarding the referral to GYN oncology. Patient scheduled a new patient with Dr Berline Lopes on 7/10 at 9 am. Patient given an arrival time of 830 am .  Explained to the patient the the doctor will perform a pelvic exam at this visit. Patient given the policy that no visitors under the 16 yrs are a loud in the [place]. Patient given the address/phone number for the clinic and that the center offers free valet service.

## 2021-09-21 ENCOUNTER — Encounter: Payer: Self-pay | Admitting: Gynecologic Oncology

## 2021-09-21 ENCOUNTER — Inpatient Hospital Stay: Payer: BC Managed Care – PPO | Admitting: Adult Health

## 2021-09-21 NOTE — Telephone Encounter (Signed)
Patient scheduled on 09/25/21 with Dr.Tucker

## 2021-09-23 NOTE — Progress Notes (Unsigned)
GYNECOLOGIC ONCOLOGY NEW PATIENT CONSULTATION   Patient Name: [name]  Patient Age: 46 y.o. 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   The patient is overall doing well from a postoperative standpoint. She sees her surgeon tomorrow for post-operative visit.  We discussed in detail pathology report from recent surgery, which found an incidental cancer. Given size of her tumor, I recommend that we proceed with imaging to rule out metastatic disease. If the patient were to have findings on PET scan concerning for metastatic disease, then we would proceed with adjuvant radiation with concurrent cisplatin, with consideration for brachytherapy given very close radial margin (will clarify where this was with pathology).   If imaging is negative for metastatic disease, we have several options. Given very close radial margin (<86m), we could proceed with radiation as adjuvant treatment. Alternatively, we could consider second surgery with pelvic lymphadenectomy and radical parametrectomy and upper vaginectomy. I spoke briefly about the challenges of such a surgery and difficulty with addressing best modality for the surgery.   We have scheduled the patient for PET scan approximately 3-4 weeks after her surgery to hopefully limit any confusion between postoperative findings and metastatic disease. Her case will then be presented at our multi-disciplinary tumor board, where we will review her pathology as well as imaging. I will call her with recommendations after this discussion.    A copy of this note was sent to the patient's referring provider.   60 minutes of total time was spent for  this patient encounter, including preparation, face-to-face counseling with the patient and coordination of care, and documentation of the encounter.   KJeral Pinch MD  Division of Gynecologic Oncology  Department of Obstetrics and Gynecology  [facility] ___________________________________________  Chief Complaint: No chief complaint on file.   History of Present Illness:  Theresa L. LLiddicoatis a 46y.o. y.o. female who is seen in consultation at the request of LPrincess Bruins MD for an evaluation of newly diagnosed cervical cancer.  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 She is no other history of abnormal Pap smears, or cervical procedures. She has been amenorrheic since September of 2014 during chemotherapy treatment (including no resumption of menses after completion of chemotherapy).   10/2013: Pap - normal, HR HPV+.   12/2013: Cervical biopsy and ECC - LSIL.  01/2014: BSO. LEEP - no dysplasia or malignancy, koilocytic changes.  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 09/12/21: TRH for CIN3, fibroids. Pathology revealed grade 1 SCC of the cervix arising in CIN3, tumor size 1.5cm, DOI 972m 0.31m331mrom radial margin. LVI negative.   Patient presents today.  Notes overall doing well after surgery.  Has some soreness, occasional cramping.  Continues to experience fatigue.  She denies any vaginal bleeding or discharge.  Reports normal bowel and bladder function.  Endorses a good appetite without nausea or emesis.  She is seeing her OB/GYN for postoperative visit tomorrow.  Prior to surgical menopause in 2015 after her BSO, she reports normal menses.  She denies any vaginal bleeding or discharge since her BSO in 2015.  She was on  letrozole for her breast cancer until just over a year ago.  PAST MEDICAL HISTORY:  Past Medical History:  Diagnosis Date   Abnormal Pap smear of cervix    06-09-21 HGSIL HPV HR +, 16+, 18/45 neg   Anxiety    Breast cancer ([place]) 10/16/2012   left, lower medial, 8 o'clock   Diabetes mellitus without complication (HCC)    Type 2, Pt takes Metformin.   DVT (deep venous thrombosis) ([place]) 02/2013   RT LEG , during breast cancer treatment   Family history of anesthesia complication    pt's mother has hx. of post-op N/V   History of radiation therapy 07/06/13-08/20/13   left breast/   History of transfusion    x2 during chemotherapy   Hypertension    under control with med., has been on med. since 09/2012   Pain in the wrist    BILATERAL   Peripheral neuropathy    numbness in toes due to chemotherapy   Personal history of chemotherapy 2014   Red devil & 5FU, 8 sessions and Taxol 12 sessions   Personal history of radiation therapy 2014   07/06/13 - 08/20/13   Status post chemotherapy    Patient completed 7 years of Letrozole in 08/2020.   Urinary incontinence in female    follows with Urology     PAST SURGICAL HISTORY:  Past Surgical History:  Procedure Laterality Date   BREAST BIOPSY Right 11/2017   benign   BREAST LUMPECTOMY Left 2014   BREAST LUMPECTOMY WITH NEEDLE LOCALIZATION AND AXILLARY SENTINEL LYMPH NODE BX Left 12/10/2012   Procedure: BREAST LUMPECTOMY WITH NEEDLE LOCALIZATION AND AXILLARY SENTINEL LYMPH NODE BX;  Surgeon: Merrie Roof, MD;  Location: [place];  Service: General;  Laterality: Left;  Needle loc BCG 7:30  nuc med 9:30     [place]  07/13/2021   LEEP N/A 01/26/2014   Procedure: LOOP ELECTROSURGICAL EXCISION PROCEDURE (LEEP);  Surgeon: Lucita Lora. Alycia Rossetti, MD;  Location: [facility];  Service: Gynecology;  Laterality: N/A;   OPEN REDUCTION INTERNAL FIXATION (ORIF) TIBIA/FIBULA FRACTURE Right 2013   PORT-A-CATH REMOVAL   2015   PORTACATH PLACEMENT Right 01/09/2013   Procedure: INSERTION PORT-A-CATH;  Surgeon: Merrie Roof, MD;  Location: [place];  Service: General;  Laterality: Right;   ROBOTIC ASSISTED BILATERAL SALPINGO OOPHERECTOMY Bilateral 01/26/2014   Procedure: ROBOTIC ASSISTED BILATERAL SALPINGO OOPHORECTOMY;  Surgeon: Lucita Lora. Alycia Rossetti, MD;  Location: [facility];  Service: Gynecology;  Laterality: Bilateral;   ROBOTIC ASSISTED LAPAROSCOPIC HYSTERECTOMY AND SALPINGECTOMY Bilateral 09/12/2021   Procedure: XI ROBOTIC ASSISTED LAPAROSCOPIC HYSTERECTOMY;  Surgeon: Princess Bruins, MD;  Location: [place];  Service: Gynecology;  Laterality: Bilateral;   TONSILLECTOMY AND ADENOIDECTOMY     as a teenager    OB/GYN HISTORY:  OB History  Gravida Para Term Preterm AB Living  1 1 0 1 0 1  SAB IAB Ectopic Multiple Live Births  0 0 0 0 1    # Outcome Date GA Lbr Len/2nd Weight Sex Delivery Anes PTL Lv  1 Preterm             Patient's last menstrual period was 12/28/2012.  Age at menarche: 62 Age at menopause: 56 Hx of  HRT: Denies Hx of STDs: HPV Last pap: see HPI History of abnormal pap smears: yes  SCREENING STUDIES:  Last mammogram: 02/2021  Last colonoscopy: Has not had  MEDICATIONS: Outpatient Encounter Medications as of 09/25/2021  Medication Sig   b complex vitamins tablet Take 1 tablet by mouth every morning.   BIOTIN PO Take 1 tablet by mouth every morning.   Cholecalciferol 2000 units CAPS Take 1 capsule by mouth daily.   desmopressin (DDAVP) 0.2 MG tablet Take 200 mcg by mouth at bedtime. For urinary incontinence   lisinopril (ZESTRIL) 10 MG tablet TAKE 1 TABLET BY MOUTH EVERY DAY IN THE MORNING   metformin (FORTAMET) 500 MG (OSM) 24 hr tablet Take 1 tablet (500 mg total) by mouth daily with breakfast.   [DISCONTINUED] denosumab (PROLIA) 60 MG/ML SOSY injection Inject 60 mg into the skin every 6 (six) months.   No facility-administered encounter  medications on file as of 09/25/2021.    ALLERGIES:  Allergies  Allergen Reactions   Influenza Virus Vaccine Swelling   Amoxicillin Other (See Comments)    States "it does not work"     FAMILY HISTORY:  Family History  Problem Relation Age of Onset   Anesthesia problems Mother        post-op N/V   Breast cancer Maternal Grandmother        50-60s   Lung cancer Paternal Grandmother    Colon cancer Neg Hx    Ovarian cancer Neg Hx    Endometrial cancer Neg Hx    Pancreatic cancer Neg Hx    Prostate cancer Neg Hx      SOCIAL HISTORY:  Social Connections: Not on file    REVIEW OF SYSTEMS:  Denies appetite changes, fevers, chills, fatigue, unexplained weight changes. Denies hearing loss, neck lumps or masses, mouth sores, ringing in ears or voice changes. Denies cough or wheezing.  Denies shortness of breath. Denies chest pain or palpitations. Denies leg swelling. Denies abdominal distention, pain, blood in stools, constipation, diarrhea, nausea, vomiting, or early satiety. Denies pain with intercourse, dysuria, frequency, hematuria or incontinence. Denies hot flashes, pelvic pain, vaginal bleeding or vaginal discharge.   Denies joint pain, back pain or muscle pain/cramps. Denies itching, rash, or wounds. Denies dizziness, headaches, numbness or seizures. Denies swollen lymph nodes or glands, denies easy bruising or bleeding. Denies anxiety, depression, confusion, or decreased concentration.  Physical Exam:  Vital Signs for this encounter:  Blood pressure (!) 141/92, pulse 70, temperature 98.2 F (36.8 C), temperature source Oral, resp. rate 18, height '5\' 6"'$  (1.676 m), weight 235 lb 12.8 oz (107 kg), last menstrual period 12/28/2012, SpO2 100 %. Body mass index is 38.06 kg/m. General: Alert, oriented, no acute distress.  HEENT: Normocephalic, atraumatic. Sclera anicteric.  Chest: Clear to auscultation bilaterally. No wheezes, rhonchi, or rales. Cardiovascular: Regular  rate and rhythm, no murmurs, rubs, or gallops.  Abdomen: Obese. Normoactive bowel sounds. Soft, nondistended, nontender to palpation. No masses or hepatosplenomegaly appreciated. No palpable fluid wave.  Well-healing incisions although some mild erythema around the umbilicus consistent with Candida and this incision is superficially open. Extremities: Grossly normal range of motion. Warm, well perfused. No edema bilaterally.  Skin: No rashes or lesions.  Lymphatics: No cervical, supraclavicular, or inguinal adenopathy.  GU:  Normal external female genitalia.  No lesions. No discharge or bleeding.             Bladder/urethra:  No lesions or masses, well supported bladder  Vagina: Well rugated, no lesions or masses.             Cervix/uterus: Surgically absent.  Cuff intact, on bimanual exam, no fluctuance or tenderness with palpation.  No nodularity.  On rectovaginal exam, there is some mild thickening along the right aspect of the vaginal cuff.             Adnexa: No masses appreciated.  Rectal: See above.  LABORATORY AND RADIOLOGIC DATA:  Outside medical records were reviewed to synthesize the above history, along with the history and physical obtained during the visit.   Lab Results  Component Value Date   WBC 17.9 (H) 09/12/2021   HGB 14.0 09/12/2021   HCT 41.0 09/12/2021   PLT 227 09/12/2021   GLUCOSE 210 (H) 09/08/2021   CHOL 158 12/02/2019   TRIG 171 (H) 12/02/2019   HDL 40 (L) 12/02/2019   LDLCALC 91 12/02/2019   ALT 20 03/23/2021   AST 15 03/23/2021   NA 138 09/08/2021   K 3.8 09/08/2021   CL 103 09/08/2021   CREATININE 0.77 09/08/2021   BUN 13 09/08/2021   CO2 27 09/08/2021   TSH 1.13 12/02/2019   HGBA1C 6.1 06/20/2020   MICROALBUR 3.2 12/02/2019   Pathology on 6/27: A. 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  Dr. Saralyn Pilar and Dr. Alric Seton concur with the diagnosis.

## 2021-09-25 ENCOUNTER — Inpatient Hospital Stay: Payer: BC Managed Care – PPO | Attending: Gynecologic Oncology | Admitting: Gynecologic Oncology

## 2021-09-25 ENCOUNTER — Other Ambulatory Visit: Payer: Self-pay

## 2021-09-25 ENCOUNTER — Encounter: Payer: Self-pay | Admitting: Gynecologic Oncology

## 2021-09-25 VITALS — BP 141/92 | HR 70 | Temp 98.2°F | Resp 18 | Ht 66.0 in | Wt 235.8 lb

## 2021-09-25 DIAGNOSIS — Z923 Personal history of irradiation: Secondary | ICD-10-CM | POA: Diagnosis not present

## 2021-09-25 DIAGNOSIS — R32 Unspecified urinary incontinence: Secondary | ICD-10-CM | POA: Insufficient documentation

## 2021-09-25 DIAGNOSIS — E669 Obesity, unspecified: Secondary | ICD-10-CM | POA: Diagnosis not present

## 2021-09-25 DIAGNOSIS — Z90710 Acquired absence of both cervix and uterus: Secondary | ICD-10-CM | POA: Diagnosis not present

## 2021-09-25 DIAGNOSIS — C539 Malignant neoplasm of cervix uteri, unspecified: Secondary | ICD-10-CM | POA: Diagnosis not present

## 2021-09-25 DIAGNOSIS — Z9221 Personal history of antineoplastic chemotherapy: Secondary | ICD-10-CM | POA: Diagnosis not present

## 2021-09-25 DIAGNOSIS — Z6835 Body mass index (BMI) 35.0-35.9, adult: Secondary | ICD-10-CM

## 2021-09-25 DIAGNOSIS — F419 Anxiety disorder, unspecified: Secondary | ICD-10-CM | POA: Insufficient documentation

## 2021-09-25 DIAGNOSIS — Z90722 Acquired absence of ovaries, bilateral: Secondary | ICD-10-CM | POA: Insufficient documentation

## 2021-09-25 DIAGNOSIS — I10 Essential (primary) hypertension: Secondary | ICD-10-CM | POA: Insufficient documentation

## 2021-09-25 DIAGNOSIS — Z853 Personal history of malignant neoplasm of breast: Secondary | ICD-10-CM | POA: Insufficient documentation

## 2021-09-25 DIAGNOSIS — Z79899 Other long term (current) drug therapy: Secondary | ICD-10-CM | POA: Diagnosis not present

## 2021-09-25 DIAGNOSIS — B372 Candidiasis of skin and nail: Secondary | ICD-10-CM

## 2021-09-25 DIAGNOSIS — Z86718 Personal history of other venous thrombosis and embolism: Secondary | ICD-10-CM | POA: Diagnosis not present

## 2021-09-25 DIAGNOSIS — E119 Type 2 diabetes mellitus without complications: Secondary | ICD-10-CM | POA: Insufficient documentation

## 2021-09-25 DIAGNOSIS — Z6838 Body mass index (BMI) 38.0-38.9, adult: Secondary | ICD-10-CM | POA: Diagnosis not present

## 2021-09-25 DIAGNOSIS — G620 Drug-induced polyneuropathy: Secondary | ICD-10-CM | POA: Insufficient documentation

## 2021-09-25 DIAGNOSIS — Z7984 Long term (current) use of oral hypoglycemic drugs: Secondary | ICD-10-CM | POA: Insufficient documentation

## 2021-09-25 DIAGNOSIS — Z7189 Other specified counseling: Secondary | ICD-10-CM

## 2021-09-25 DIAGNOSIS — R87810 Cervical high risk human papillomavirus (HPV) DNA test positive: Secondary | ICD-10-CM

## 2021-09-25 DIAGNOSIS — B379 Candidiasis, unspecified: Secondary | ICD-10-CM

## 2021-09-25 DIAGNOSIS — E6609 Other obesity due to excess calories: Secondary | ICD-10-CM

## 2021-09-25 MED ORDER — NYSTATIN 100000 UNIT/GM EX POWD: 1.0000 | Freq: Three times a day (TID) | CUTANEOUS | 1 refills | Status: DC

## 2021-09-25 NOTE — Patient Instructions (Addendum)
It was a pleasure meeting you today.  Based on your pathology from recent surgery, you have stage I cervical cancer.  Once we have your PET scan, we will talk about your pathology from surgery and your imaging at our tumor conference.  This will happen on 7/24.  I will call you afterward to discuss next steps.  Based on the imaging, we will either be talking about another surgery (for removal of lymph nodes in your pelvis, possibly some of the tissue around your cervix) or moving forward with getting you set up for radiation.  If you have any concerns or questions before that phone call, please call me at the clinic.  Our number here is [phone].  I sent a prescription for a powder to use for yeast around your bellybutton.

## 2021-09-26 ENCOUNTER — Encounter: Payer: Self-pay | Admitting: Obstetrics & Gynecology

## 2021-09-26 ENCOUNTER — Ambulatory Visit (INDEPENDENT_AMBULATORY_CARE_PROVIDER_SITE_OTHER): Payer: BC Managed Care – PPO | Admitting: Obstetrics & Gynecology

## 2021-09-26 VITALS — BP 120/74

## 2021-09-26 DIAGNOSIS — Z09 Encounter for follow-up examination after completed treatment for conditions other than malignant neoplasm: Secondary | ICD-10-CM

## 2021-09-26 DIAGNOSIS — C539 Malignant neoplasm of cervix uteri, unspecified: Secondary | ICD-10-CM

## 2021-09-26 NOTE — Progress Notes (Signed)
    [name] Aug 14, 1975 [id number]        45 y.o.  [name]   RP: Postop Robotic TLH/BS on 09/12/21  HPI: Good postop healing.  Incisions healing well, closed.  No vaginal bleeding, normal vaginal secretions.  No abdominopelvic pain.  Urine/BMs normal.  No fever.  Incidental Dx of Invasive AdenoCa of the Cervix on Pathology.  Seen by Gyn-Onco Dr Berline Lopes yesterday.  PET scan planned.   OB History  Gravida Para Term Preterm AB Living  1 1 0 1 0 1  SAB IAB Ectopic Multiple Live Births  0 0 0 0 1    # Outcome Date GA Lbr Len/2nd Weight Sex Delivery Anes PTL Lv  1 Preterm             Past medical history,surgical history, problem list, medications, allergies, family history and social history were all reviewed and documented in the EPIC chart.   Directed ROS with pertinent positives and negatives documented in the history of present illness/assessment and plan.  Exam:  Vitals:   09/26/21 1148  BP: 120/74   General appearance:  Normal  Abdomen: Normal.  Incisions well closed.  Small suture ends trimmed.  Gynecologic exam: Vulva normal.  Speculum:  Vaginal vault well closed.  Normal secretions, no bleeding.  Patho 09/12/21: FINAL MICROSCOPIC DIAGNOSIS:   A. 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 [name]   1. Status post gynecological surgery, follow-up exam Good postop healing.  Incisions healing well, closed.  No vaginal bleeding, normal vaginal secretions.  No abdominopelvic pain.  Urine/BMs normal.  No fever.  Incidental Dx of Invasive AdenoCa of the Cervix on Pathology.  Seen by Gyn-Onco Dr Berline Lopes yesterday.   PET scan planned.  Good postop gyn exam.  Healing well without Cx.  Will come back at 6 weeks only if has problems given that Dr Berline Lopes will take over her management at this time.  F/U annual gyn exam in a year.  2. Cervical adenocarcinoma ([place])  Incidental Dx of Invasive AdenoCa of the Cervix on Pathology.  Seen by Gyn-Onco Dr Berline Lopes yesterday.  PET scan planned.  Per Dr Charisse March plan, given the size of the cancer and the very close margin <1 mm, patient with receive Adjuvent Radiation Therapy.  Will get Cisplatin if Mets are found on the PET scan.  [name], 12:11 PM 09/26/2021

## 2021-09-28 ENCOUNTER — Other Ambulatory Visit (HOSPITAL_COMMUNITY): Payer: BC Managed Care – PPO

## 2021-09-29 ENCOUNTER — Telehealth: Payer: Self-pay | Admitting: *Deleted

## 2021-09-29 NOTE — Telephone Encounter (Signed)
Patient called Robotic TLH/BS on 09/12/21, called asking if okay for her to soak in bath tub now and/or swim? Please advise

## 2021-10-02 NOTE — Telephone Encounter (Signed)
Dr.Lavoie replied "Yes if all incisions well closed and no vaginal bleeding. "   Patient informed.

## 2021-10-05 ENCOUNTER — Encounter (HOSPITAL_COMMUNITY)
Admission: RE | Admit: 2021-10-05 | Discharge: 2021-10-05 | Disposition: A | Discharge status: Home / Self Care | Payer: BC Managed Care – PPO | Source: Ambulatory Visit | Attending: Gynecologic Oncology | Admitting: Gynecologic Oncology

## 2021-10-05 DIAGNOSIS — C539 Malignant neoplasm of cervix uteri, unspecified: Secondary | ICD-10-CM | POA: Diagnosis present

## 2021-10-05 MED ORDER — FLUDEOXYGLUCOSE F - 18 (FDG) INJECTION
11.9700 | Freq: Once | INTRAVENOUS | Status: AC | PRN
  Administered 2021-10-05: 11.97 via INTRAVENOUS

## 2021-10-09 ENCOUNTER — Other Ambulatory Visit: Payer: Self-pay | Admitting: *Deleted

## 2021-10-09 ENCOUNTER — Other Ambulatory Visit: Payer: Self-pay | Admitting: Gynecologic Oncology

## 2021-10-09 ENCOUNTER — Telehealth: Payer: Self-pay | Admitting: Gynecologic Oncology

## 2021-10-09 NOTE — Progress Notes (Signed)
The proposed treatment discussed in conference is for discussion purpose only and is not a binding recommendation.  The patients have not been physically examined, or presented with their treatment options.  Therefore, final treatment plans cannot be decided.  

## 2021-10-09 NOTE — Telephone Encounter (Signed)
I called the patient to discuss PET scan and tumor board recommendations.  PET scan is negative for metastatic disease.  Prior discussion at tumor board, to treatment options both felt to be reasonable.  I had a long discussion with her today about options for second surgery versus adjuvant radiation.  Second surgery including pelvic lymph node dissection and parametrectomy (if feasible and safe) could potentially help avoid the need for adjuvant radiation.  We discussed risk of pelvic lymph node dissection including injury to adjacent structures including nerves and blood vessels as well as the ureters.  We also discussed removal of the parametrium and risk of damage to surrounding structures including the ureter, bladder, and rectum.  We discussed need for short-term postoperative catheter given risk of bladder dysfunction.  If final pathology supports that she needed adjuvant radiation, we also discussed the increased risk of morbidity related to this radical surgery followed by radiation.  In terms of approach, I discussed data from 2018 that found worse cancer related outcomes and minimally invasive surgery for early stage cervical cancer.  We discussed some of the limitations of this data as well as some more recently presented data that would suggest no negative effect in terms of cancer survival when minimally invasive surgery is used.  I think this is somewhat of a unique situation as she has likely already had all of her cancer excised.  I think the likelihood of success is greater and risk related to surgery is less with a robotic approach.  We also discussed the option of proceeding with no second surgery and instead with adjuvant radiation.  I discussed some of the short and long-term side effects of radiation.  Overall, I favor plan to remove pelvic lymph nodes and hopefully some parametria to avoid need for adjuvant radiation.  Patient will think about this option.  I have left a note from  [place] to reach out with her tomorrow to discuss possible date for surgery in mid August if she decides to proceed with surgery.  Jeral Pinch MD Gynecologic Oncology

## 2021-10-10 ENCOUNTER — Other Ambulatory Visit: Payer: Self-pay | Admitting: Gynecologic Oncology

## 2021-10-10 ENCOUNTER — Telehealth: Payer: Self-pay

## 2021-10-10 ENCOUNTER — Encounter: Payer: Self-pay | Admitting: Adult Health

## 2021-10-10 DIAGNOSIS — C539 Malignant neoplasm of cervix uteri, unspecified: Secondary | ICD-10-CM

## 2021-10-10 NOTE — Telephone Encounter (Signed)
Per [name] NP. I spoke to patient regarding surgery date Surgery date 10/31/21.  Pre-op date 10/23/21 @ 1:00pm. 1 week and 3 week post-op visits were also scheduled at this time. Pt ok with surgery date and states she didn't have any questions or concerns at this time.

## 2021-10-19 ENCOUNTER — Encounter: Payer: BC Managed Care – PPO | Admitting: Obstetrics & Gynecology

## 2021-10-19 NOTE — Progress Notes (Addendum)
COVID Vaccine received:  '[]'$  No '[x]'$  Yes Date of any COVID positive Test in last 29 days:None  PCP - [facility], [place], [place]      No particular MD Cardiologist - [place] Bali, MD  Chest x-ray - 2014 EKG -  09-08-21 Epic Stress Test - none ECHO - 2015  Cardiac Cath - none  Pacemaker/ICD device     '[x]'$  N/A Spinal Cord Stimulator:'[x]'$  No '[]'$  Yes   Other Implants:   Bowel Prep - Eat light , non-gas producing foods the day prior to surgery then Clear liquids after Midnight.   History of Sleep Apnea? '[x]'$  No '[]'$  Yes   Sleep Study Date:   CPAP used?- '[x]'$  No '[]'$  Yes  (Instruct to bring their mask & Tubing)  Does the patient monitor blood sugar? '[x]'$  No '[]'$  Yes  '[]'$  N/A  Blood Thinner Instructions: None Aspirin Instructions:None Last Dose:  ERAS Protocol Ordered: '[]'$  No  '[x]'$  Yes     No Drink was ordered  Comments:   Activity level: Patient can climb a flight of stairs without difficulty;  '[x]'$  No CP  '[x]'$  No SOB  Anesthesia review: Tachycardia  Patient denies shortness of breath, fever, cough and chest pain at PAT appointment.  Patient verbalized understanding and agreement to the Pre-Surgical Instructions that were given to them at this PAT appointment. Patient was also educated of the need to review these PAT instructions again prior to his/her surgery.I reviewed the appropriate phone numbers to call if they have any and questions or concerns.

## 2021-10-19 NOTE — Patient Instructions (Addendum)
DUE TO SPACE LIMITATIONS, ONLY TWO VISITORS  (aged 46 and older) ARE ALLOWED TO COME WITH YOU AND STAY IN THE WAITING ROOM DURING YOUR PRE OP AND PROCEDURE.   **NO VISITORS ARE ALLOWED IN THE SHORT STAY AREA OR RECOVERY ROOM!!**  IYou are not required to quarantine at this time prior to your surgery. However, you must do this: Hand Hygiene often Do NOT share personal items Notify your provider if you are in close contact with someone who has COVID or you develop fever 100.4 or greater, new onset of sneezing, cough, sore throat, shortness of breath or body aches.       Your procedure is scheduled on:  Tuesday October 31, 2021  Report to [facility] Main Entrance.  Report to admitting at: 08:45  AM  +++++Call this number if you have any questions or problems the morning of surgery [phone]  Do not eat food :After Midnight the night prior to your surgery/procedure.  After Midnight you may have the following liquids until  08:00 AM DAY OF SURGERY  Clear Liquid Diet Water Black Coffee (sugar ok, NO MILK/CREAM OR CREAMERS)  Tea (sugar ok, NO MILK/CREAM OR CREAMERS) regular and decaf                             Plain Jell-O (NO RED)                                           Fruit ices (not with fruit pulp, NO RED)                                     Popsicles (NO RED)                                                                  Juice: apple, WHITE grape, WHITE cranberry Sports drinks like Gatorade (NO RED)                 FOLLOW BOWEL PREP AND ANY ADDITIONAL PRE OP INSTRUCTIONS YOU RECEIVED FROM YOUR SURGEON'S OFFICE!!!   Eat a Light, non-gas producing foods the day prior to surgery. Clear liquids after Midnight before your surgery.    Oral Hygiene is also important to reduce your risk of infection.        Remember - BRUSH YOUR TEETH THE MORNING OF SURGERY WITH YOUR REGULAR TOOTHPASTE  Do NOT smoke after Midnight the night before surgery.   Take ONLY these  medicines the morning of surgery with A SIP OF WATER: no medications  NO METFORMIN THE DAY OF SURGERY.  TAKE YOUR USUAL DOSE OF METFORMIN THE DAY BEFORE YOUR SURGERY                       You may not have any metal on your body including hair pins, jewelry, and body piercing  Do not wear make-up, lotions, powders, perfumes or deodorant  Do not wear nail polish including gel and S&S, artificial / acrylic nails, or any  other type of covering on natural nails including finger and toenails. If you have artificial nails, gel coating, etc., that needs to be removed by a nail salon, Please have this removed prior to surgery. Not doing so may mean that your surgery could be cancelled or delayed if the Surgeon or anesthesia staff feels like they are unable to monitor you safely.   Do not shave 48 hours prior to surgery to avoid nicks in your skin which may contribute to postoperative infections.   Contacts, Hearing Aids, dentures or bridgework may not be worn into surgery.   DO NOT [place]. PHARMACY WILL DISPENSE MEDICATIONS LISTED ON YOUR MEDICATION LIST TO YOU DURING YOUR ADMISSION [place]!   Patients discharged on the day of surgery will not be allowed to drive home.  Someone NEEDS to stay with you for the first 24 hours after anesthesia.  Special Instructions: Bring a copy of your healthcare power of attorney and living will documents the day of surgery, if you wish to have them scanned into your [place] Medical Records- EPIC  Please read over the following fact sheets you were given: IF YOU HAVE QUESTIONS ABOUT YOUR PRE-OP INSTRUCTIONS, PLEASE CALL [phone]  ([place])   [place] - Preparing for Surgery Before surgery, you can play an important role.  Because skin is not sterile, your skin needs to be as free of germs as possible.  You can reduce the number of germs on your skin by washing with CHG (chlorahexidine gluconate) soap before  surgery.  CHG is an antiseptic cleaner which kills germs and bonds with the skin to continue killing germs even after washing. Please DO NOT use if you have an allergy to CHG or antibacterial soaps.  If your skin becomes reddened/irritated stop using the CHG and inform your nurse when you arrive at Short Stay. Do not shave (including legs and underarms) for at least 48 hours prior to the first CHG shower.  You may shave your face/neck.  Please follow these instructions carefully:  1.  Shower with CHG Soap the night before surgery and the  morning of surgery.  2.  If you choose to wash your hair, wash your hair first as usual with your normal  shampoo.  3.  After you shampoo, rinse your hair and body thoroughly to remove the shampoo.                             4.  Use CHG as you would any other liquid soap.  You can apply chg directly to the skin and wash.  Gently with a scrungie or clean washcloth.  5.  Apply the CHG Soap to your body ONLY FROM THE NECK DOWN.   Do not use on face/ open                           Wound or open sores. Avoid contact with eyes, ears mouth and genitals (private parts).                       Wash face,  Genitals (private parts) with your normal soap.             6.  Wash thoroughly, paying special attention to the area where your  surgery  will be performed.  7.  Thoroughly rinse your body with warm water  from the neck down.  8.  DO NOT shower/wash with your normal soap after using and rinsing off the CHG Soap.            9.  Pat yourself dry with a clean towel.            10.  Wear clean pajamas.            11.  Place clean sheets on your bed the night of your first shower and do not  sleep with pets.  ON THE DAY OF SURGERY : Do not apply any lotions/deodorants the morning of surgery.  Please wear clean clothes to the hospital/surgery center.    FAILURE TO FOLLOW THESE INSTRUCTIONS MAY RESULT IN THE CANCELLATION OF YOUR SURGERY  PATIENT  SIGNATURE_________________________________  NURSE SIGNATURE__________________________________  ________________________________________________________________________

## 2021-10-20 ENCOUNTER — Encounter: Payer: Self-pay | Admitting: Gynecologic Oncology

## 2021-10-20 NOTE — Progress Notes (Unsigned)
Patient here for a pre-operative appointment prior to her scheduled surgery on October 31, 2021. She is scheduled for robotic assisted laparoscopic pelvic lymph node dissection, possible robotic assisted radical parametrectomy, possible upper vaginectomy. She has her pre-admission testing appointment later today at [facility]. The surgery was discussed in detail. See after visit summary for additional details. Visual aids used to discuss items related to surgery including sequential compression stockings, foley catheter, IV pump, multi-modal pain regimen including tylenol, photo of the surgical robot, female reproductive system to discuss surgery in detail.      Discussed post-op pain management in detail including the aspects of the enhanced recovery pathway.  Advised her that a new prescription would be sent in for oxycodone and it is only to be used for after her upcoming surgery.  We discussed the use of tylenol post-op and to monitor for a maximum of 4,000 mg in a 24 hour period.  Also prescribed sennakot to be used after surgery and to hold if having loose stools.  Discussed bowel regimen in detail.     Discussed the use of SCDs and measures to take at home to prevent DVT including frequent mobility.  Reportable signs and symptoms of DVT discussed. Post-operative instructions discussed and expectations for after surgery. Incisional care discussed as well including reportable signs and symptoms including erythema, drainage, wound separation.   Patient has been driving long distances frequently for work and noticed a bruise like area on her right vulva. She denies drainage or erythema from this area. She first noticed it after a long drive with the area still present the next morning. With chaperone, the right vulva was assessed. No erythema, palpable masses. Skin discolored with appearance of bruising.   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 She is aware of how to give lovenox injections if  needed. I will update Dr. Berline Lopes and see if DVT prophylaxis is needed post-operatively for a short course. We will also find out when she needs to stop taking DDAVP.     10 minutes spent with the patient.  Verbalizing understanding of material discussed. No needs or concerns voiced at the end of the visit.   Advised patient to call for any needs.  Advised that her post-operative medications had been prescribed and could be picked up at any time. Peri care discussed. Advised patient if skin lesion is still present, there is a possibility this can be biopsied at her upcoming procedure.   This appointment is included in the global surgical bundle as pre-operative teaching and has no charge.

## 2021-10-20 NOTE — Patient Instructions (Addendum)
Preparing for your Surgery  Plan for surgery on October 31, 2021 with Dr. Jeral Pinch at [place] will be scheduled for robotic assisted laparoscopic pelvic lymph node dissection (removing lymph nodes in the pelvis), possible robotic assisted radical parametrectomy (removing the parametrial tissue that would have surrounded the cervix), possible upper vaginectomy (removal of section of upper vagina).   Pre-operative Testing -(Done, on 8/7) You will receive a phone call from presurgical testing at [facility] to arrange for a pre-operative appointment and lab work.  -Bring your insurance card, copy of an advanced directive if applicable, medication list  -At that visit, you will be asked to sign a consent for a possible blood transfusion in case a transfusion becomes necessary during surgery.  The need for a blood transfusion is rare but having consent is a necessary part of your care.     -You should not be taking blood thinners or aspirin at least ten days prior to surgery unless instructed by your surgeon.  -Do not take supplements such as fish oil (omega 3), red yeast rice, turmeric before your surgery. You want to avoid medications with aspirin in them including headache powders such as BC or Goody's), Excedrin migraine.  Day Before Surgery at [place] will be asked to take in a light diet the day before surgery. You will be advised you can have clear liquids up until 3 hours before your surgery.    Eat a light diet the day before surgery.  Examples including soups, broths, toast, yogurt, mashed potatoes.  AVOID GAS PRODUCING FOODS. Things to avoid include carbonated beverages (fizzy beverages, sodas), raw fruits and raw vegetables (uncooked), or beans.   If your bowels are filled with gas, your surgeon will have difficulty visualizing your pelvic organs which increases your surgical risks.  Your role in recovery Your role is to become active as soon as  directed by your doctor, while still giving yourself time to heal.  Rest when you feel tired. You will be asked to do the following in order to speed your recovery:  - Cough and breathe deeply. This helps to clear and expand your lungs and can prevent pneumonia after surgery.  - [place]. Do mild physical activity. Walking or moving your legs help your circulation and body functions return to normal. Do not try to get up or walk alone the first time after surgery.   -If you develop swelling on one leg or the other, pain in the back of your leg, redness/warmth in one of your legs, please call the office or go to the Emergency Room to have a doppler to rule out a blood clot. For shortness of breath, chest pain-seek care in the Emergency Room as soon as possible. - Actively manage your pain. Managing your pain lets you move in comfort. We will ask you to rate your pain on a scale of zero to 10. It is your responsibility to tell your doctor or nurse where and how much you hurt so your pain can be treated.  Special Considerations -If you are diabetic, you may be placed on insulin after surgery to have closer control over your blood sugars to promote healing and recovery.  This does not mean that you will be discharged on insulin.  If applicable, your oral antidiabetics will be resumed when you are tolerating a solid diet.  -Your final pathology results from surgery should be available around one week after surgery and  the results will be relayed to you when available.  -Dr. Lahoma Crocker is the surgeon that assists your GYN Oncologist with surgery.  If you end up staying the night, the next day after your surgery you will either see Dr. Berline Lopes or Dr. Lahoma Crocker.  -FMLA forms can be faxed to [phone] and please allow 5-7 business days for completion.  Pain Management After Surgery -You have been prescribed your pain medication and bowel regimen medications before  surgery so that you can have these available when you are discharged from the hospital. The pain medication is for use ONLY AFTER surgery and a new prescription will not be given.   -Make sure that you have Tylenol and Ibuprofen IF YOU ARE ABLE TO TAKE THESE MEDICATIONS at home to use on a regular basis after surgery for pain control. We recommend alternating the medications every hour to six hours since they work differently and are processed in the body differently for pain relief.  -Review the attached handout on narcotic use and their risks and side effects.   Bowel Regimen -You have been prescribed Sennakot-S to take nightly to prevent constipation especially if you are taking the narcotic pain medication intermittently.  It is important to prevent constipation and drink adequate amounts of liquids. You can stop taking this medication when you are not taking pain medication and you are back on your normal bowel routine.  Risks of Surgery Risks of surgery are low but include bleeding, infection, damage to surrounding structures, re-operation, blood clots, and very rarely death.   Blood Transfusion Information (For the consent to be signed before surgery)  We will be checking your blood type before surgery so in case of emergencies, we will know what type of blood you would need.                                            WHAT IS A BLOOD TRANSFUSION?  A transfusion is the replacement of blood or some of its parts. Blood is made up of multiple cells which provide different functions. Red blood cells carry oxygen and are used for blood loss replacement. White blood cells fight against infection. Platelets control bleeding. Plasma helps clot blood. Other blood products are available for specialized needs, such as hemophilia or other clotting disorders. BEFORE THE TRANSFUSION  Who gives blood for transfusions?  You may be able to donate blood to be used at a later date on yourself  (autologous donation). Relatives can be asked to donate blood. This is generally not any safer than if you have received blood from a stranger. The same precautions are taken to ensure safety when a relative's blood is donated. Healthy volunteers who are fully evaluated to make sure their blood is safe. This is blood bank blood. Transfusion therapy is the safest it has ever been in the practice of medicine. Before blood is taken from a donor, a complete history is taken to make sure that person has no history of diseases nor engages in risky social behavior (examples are intravenous drug use or sexual activity with multiple partners). The donor's travel history is screened to minimize risk of transmitting infections, such as malaria. The donated blood is tested for signs of infectious diseases, such as HIV and hepatitis. The blood is then tested to be sure it is compatible with you in order to minimize the  chance of a transfusion reaction. If you or a relative donates blood, this is often done in anticipation of surgery and is not appropriate for emergency situations. It takes many days to process the donated blood. RISKS AND COMPLICATIONS Although transfusion therapy is very safe and saves many lives, the main dangers of transfusion include:  Getting an infectious disease. Developing a transfusion reaction. This is an allergic reaction to something in the blood you were given. Every precaution is taken to prevent this. The decision to have a blood transfusion has been considered carefully by your caregiver before blood is given. Blood is not given unless the benefits outweigh the risks.  AFTER SURGERY INSTRUCTIONS  Return to work: 4-6 weeks if applicable  You may have a foley catheter in place for one week if the radical parametrectomy is performed since this procedure can affect the nerves going to the bladder. The foley catheter will allow the bladder to rest while the nerves and surrounding area  are healing.  We recommend purchasing several bags of frozen green peas and dividing them into ziploc bags. You will want to keep these in the freezer and have them ready to use as ice packs to the vulva bruised area. Once the ice pack is no longer cold, you can get another from the freezer. The frozen peas mold to your body better than a regular ice pack.   Activity: 1. Be up and out of the bed during the day.  Take a nap if needed.  You may walk up steps but be careful and use the hand rail.  Stair climbing will tire you more than you think, you may need to stop part way and rest.   2. No lifting or straining for 6 weeks over 10 pounds. No pushing, pulling, straining for 6 weeks.  3. No driving for around 1 week(s).  Do not drive if you are taking narcotic pain medicine and make sure that your reaction time has returned.   4. You can shower as soon as the next day after surgery. Shower daily.  Use your regular soap and water (not directly on the incision) and pat your incision(s) dry afterwards; don't rub.  No tub baths or submerging your body in water until cleared by your surgeon. If you have the soap that was given to you by pre-surgical testing that was used before surgery, you do not need to use it afterwards because this can irritate your incisions.   5. No sexual activity and nothing in the vagina for 8 weeks.  6. You may experience a small amount of clear drainage from your incisions, which is normal.  If the drainage persists, increases, or changes color please call the office.  7. Do not use creams, lotions, or ointments such as neosporin on your incisions after surgery until advised by your surgeon because they can cause removal of the dermabond glue on your incisions.    8. You may experience vaginal spotting after surgery or around the 6-8 week mark from surgery when the stitches at the top of the vagina begin to dissolve.  The spotting is normal but if you experience heavy  bleeding, call our office.  9. Take Tylenol or ibuprofen first for pain if you are able to take these medications and only use narcotic pain medication for severe pain not relieved by the Tylenol or Ibuprofen.  Monitor your Tylenol intake to a max of 4,000 mg in a 24 hour period. You can alternate these medications  after surgery.  Diet: 1. Low sodium Heart Healthy Diet is recommended but you are cleared to resume your normal (before surgery) diet after your procedure.  2. It is safe to use a laxative, such as Miralax or Colace, if you have difficulty moving your bowels. You have been prescribed Sennakot-S to take at bedtime every evening after surgery to keep bowel movements regular and to prevent constipation.    Wound Care: 1. Keep clean and dry.  Shower daily.  Reasons to call the Doctor: Fever - Oral temperature greater than 100.4 degrees Fahrenheit Foul-smelling vaginal discharge Difficulty urinating Nausea and vomiting Increased pain at the site of the incision that is unrelieved with pain medicine. Difficulty breathing with or without chest pain New calf pain especially if only on one side Sudden, continuing increased vaginal bleeding with or without clots.   Contacts: For questions or concerns you should contact:  Dr. Jeral Pinch at [phone]  [name], NP at [phone]  After Hours: call [phone] and have the GYN Oncologist paged/contacted (after 5 pm or on the weekends).  Messages sent via mychart are for non-urgent matters and are not responded to after hours so for urgent needs, please call the after hours number.

## 2021-10-23 ENCOUNTER — Inpatient Hospital Stay: Payer: BC Managed Care – PPO | Attending: Gynecologic Oncology | Admitting: Gynecologic Oncology

## 2021-10-23 ENCOUNTER — Telehealth: Payer: Self-pay | Admitting: *Deleted

## 2021-10-23 ENCOUNTER — Other Ambulatory Visit: Payer: Self-pay

## 2021-10-23 ENCOUNTER — Encounter (HOSPITAL_COMMUNITY): Payer: Self-pay

## 2021-10-23 ENCOUNTER — Encounter (HOSPITAL_COMMUNITY)
Admission: RE | Admit: 2021-10-23 | Discharge: 2021-10-23 | Disposition: A | Discharge status: Home / Self Care | Payer: BC Managed Care – PPO | Source: Ambulatory Visit | Attending: Gynecologic Oncology | Admitting: Gynecologic Oncology

## 2021-10-23 VITALS — BP 156/83 | HR 84 | Temp 98.1°F | Resp 16 | Ht 66.0 in | Wt 235.0 lb

## 2021-10-23 VITALS — BP 148/98 | HR 76 | Temp 98.4°F | Resp 20 | Ht 66.0 in | Wt 240.0 lb

## 2021-10-23 DIAGNOSIS — C539 Malignant neoplasm of cervix uteri, unspecified: Secondary | ICD-10-CM | POA: Insufficient documentation

## 2021-10-23 DIAGNOSIS — R7303 Prediabetes: Secondary | ICD-10-CM | POA: Insufficient documentation

## 2021-10-23 DIAGNOSIS — Z01812 Encounter for preprocedural laboratory examination: Secondary | ICD-10-CM | POA: Diagnosis present

## 2021-10-23 DIAGNOSIS — I10 Essential (primary) hypertension: Secondary | ICD-10-CM

## 2021-10-23 DIAGNOSIS — Z01818 Encounter for other preprocedural examination: Secondary | ICD-10-CM

## 2021-10-23 DIAGNOSIS — N390 Urinary tract infection, site not specified: Secondary | ICD-10-CM | POA: Insufficient documentation

## 2021-10-23 HISTORY — DX: Personal history of urinary calculi: Z87.442

## 2021-10-23 LAB — COMPREHENSIVE METABOLIC PANEL
ALT: 43 U/L (ref 0–44)
AST: 35 U/L (ref 15–41)
Albumin: 4.2 g/dL (ref 3.5–5.0)
Alkaline Phosphatase: 56 U/L (ref 38–126)
Anion gap: 8 (ref 5–15)
BUN: 12 mg/dL (ref 6–20)
CO2: 24 mmol/L (ref 22–32)
Calcium: 9.6 mg/dL (ref 8.9–10.3)
Chloride: 105 mmol/L (ref 98–111)
Creatinine, Ser: 0.73 mg/dL (ref 0.44–1.00)
GFR, Estimated: >60 mL/min (ref >60)
Glucose, Bld: 123 mg/dL — ABNORMAL HIGH (ref 70–99)
Potassium: 3.6 mmol/L (ref 3.5–5.1)
Sodium: 137 mmol/L (ref 135–145)
Total Bilirubin: 1 mg/dL (ref 0.3–1.2)
Total Protein: 7 g/dL (ref 6.5–8.1)

## 2021-10-23 LAB — CBC
HCT: 43.2 % (ref 36.0–46.0)
Hemoglobin: 14.9 g/dL (ref 12.0–15.0)
MCH: 32 pg (ref 26.0–34.0)
MCHC: 34.5 g/dL (ref 30.0–36.0)
MCV: 92.7 fL (ref 80.0–100.0)
Platelets: 265 10*3/uL (ref 150–400)
RBC: 4.66 MIL/uL (ref 3.87–5.11)
RDW: 13.1 % (ref 11.5–15.5)
WBC: 10 10*3/uL (ref 4.0–10.5)
nRBC: 0 % (ref 0.0–0.2)

## 2021-10-23 LAB — HEMOGLOBIN A1C
Hgb A1c MFr Bld: 7.4 % — ABNORMAL HIGH (ref 4.8–5.6)
Mean Plasma Glucose: 165.68 mg/dL

## 2021-10-23 LAB — GLUCOSE, CAPILLARY: Glucose-Capillary: 126 mg/dL — ABNORMAL HIGH (ref 70–99)

## 2021-10-23 MED ORDER — SENNOSIDES-DOCUSATE SODIUM 8.6-50 MG PO TABS: 2.0000 | ORAL_TABLET | Freq: Every day | ORAL | 0 refills | Status: DC

## 2021-10-23 MED ORDER — IBUPROFEN 800 MG PO TABS: 800.0000 mg | ORAL_TABLET | Freq: Three times a day (TID) | ORAL | 0 refills | Status: DC | PRN

## 2021-10-23 MED ORDER — OXYCODONE HCL 5 MG PO TABS: 5.0000 mg | ORAL_TABLET | ORAL | 0 refills | Status: DC | PRN

## 2021-10-23 NOTE — Telephone Encounter (Signed)
Spoke with Mickel Baas, RN at [facility] and informed her that pt will be having surgery with [place] on 10-31-21 and if they can advise [place] on pt's medication desmopressin (DDAVP). RN stated that they did prescribe it to her and she will check with their provider on if pt needs to stop it or not before surgery. Gave her the call back number of [phone].

## 2021-10-23 NOTE — Telephone Encounter (Signed)
Spoke with pt today to inform her that per Dr.Tucker due to her hx of having blood clot, Dr.Tucker is recommending 2 weeks of DVT prophylaxis post surgery. We can do Lovenox once a day (40 mg) for 2 weeks. Pt stated that she's on Desmopressin (DDAVP) for incontinence issues and it was prescribed by [facility]. [name], NP informed. Pt verbalized understanding of information.

## 2021-10-24 ENCOUNTER — Telehealth: Payer: Self-pay

## 2021-10-24 NOTE — Telephone Encounter (Signed)
I spoke to the triage nurse at [facility], she states she will send a message to the Dr. regarding recommendations for Desmopressin (DDAVP) side effect is clotting of the blood and pt has a history of DVT.   The nurse is aware we prefer to have recommendations in writing as well as verbal.  Office/fax numbers given.   Surgery is scheduled for 10/31/21

## 2021-10-26 ENCOUNTER — Encounter: Payer: Self-pay | Admitting: Adult Health

## 2021-10-26 ENCOUNTER — Other Ambulatory Visit: Payer: Self-pay | Admitting: Gynecologic Oncology

## 2021-10-26 DIAGNOSIS — Z86718 Personal history of other venous thrombosis and embolism: Secondary | ICD-10-CM

## 2021-10-26 DIAGNOSIS — C539 Malignant neoplasm of cervix uteri, unspecified: Secondary | ICD-10-CM

## 2021-10-26 MED ORDER — ENOXAPARIN SODIUM 40 MG/0.4ML IJ SOSY: 40.0000 mg | PREFILLED_SYRINGE | INTRAMUSCULAR | 0 refills | Status: DC

## 2021-10-26 NOTE — Telephone Encounter (Signed)
[name] stated that Dr. Louis Meckel wrote that it would be fine to hold the DDAVP for a few days before surgery. He was to call Dr. Charisse March office . [name] that Dr. Louis Meckel has not reached out to Dr. Charisse March office. [name] will send him a message  requesting specific days that the DDAVP needs to be held. He may call back to the office.

## 2021-10-26 NOTE — Telephone Encounter (Signed)
LM for  Ms Lengacher to call back to the office to review Holding DDAVP prior to surgery and to discuss the Lovenox injections to begin daily 24 hours after surgery for 2 weeks to prevent blood clots.

## 2021-10-26 NOTE — Telephone Encounter (Signed)
Dr. Louis Meckel called and spoke with Dr. Berline Lopes today 10-26-21 regarding DDAVP.

## 2021-10-27 ENCOUNTER — Encounter: Payer: Self-pay | Admitting: Adult Health

## 2021-10-27 NOTE — Telephone Encounter (Signed)
Called pt to inform her that Dr. Berline Lopes and Dr. Louis Meckel spoke and they recommend holding the DDAVP 48 hours prior to surgery (last dose 10/28/21) and recommend holding it for one week after surgery (resume on 11/08/21).   Informed pt that Lovenox injections will be used after surgery- once a day for 2 weeks & to give the injections at the same time each day- between 11-12 since surgery will occur during that time. Pt stated that she is familiar with Lovenox injections as she has taken them before. Also, informed pt to monitor for any abnormal bleeding and to call the office with any questions or concerns.   Confirmed that [facility] [place] is the correct pharmacy and the co-pay will be $15.00.

## 2021-10-30 ENCOUNTER — Telehealth: Payer: Self-pay

## 2021-10-30 NOTE — Telephone Encounter (Signed)
Telephone call to check on pre-operative status.  Patient compliant with pre-operative instructions.  Reinforced nothing to eat after midnight. Clear liquids until 0800. Patient to arrive at [place]. Last dose of DDAVP was 10-27-21 per patient. No questions or concerns voiced.  Instructed to call for any needs.

## 2021-10-31 ENCOUNTER — Ambulatory Visit (HOSPITAL_COMMUNITY): Payer: BC Managed Care – PPO | Admitting: Certified Registered"

## 2021-10-31 ENCOUNTER — Other Ambulatory Visit: Payer: Self-pay

## 2021-10-31 ENCOUNTER — Ambulatory Visit (HOSPITAL_COMMUNITY)
Admission: RE | Admit: 2021-10-31 | Discharge: 2021-11-01 | Disposition: A | Discharge status: Home / Self Care | Payer: BC Managed Care – PPO | Source: Ambulatory Visit | Attending: Gynecologic Oncology | Admitting: Gynecologic Oncology

## 2021-10-31 ENCOUNTER — Encounter (HOSPITAL_COMMUNITY)
Admission: RE | Disposition: A | Discharge status: Home / Self Care | Payer: Self-pay | Source: Ambulatory Visit | Attending: Gynecologic Oncology

## 2021-10-31 ENCOUNTER — Encounter (HOSPITAL_COMMUNITY): Payer: Self-pay | Admitting: Gynecologic Oncology

## 2021-10-31 DIAGNOSIS — Z224 Carrier of infections with a predominantly sexual mode of transmission: Secondary | ICD-10-CM | POA: Diagnosis not present

## 2021-10-31 DIAGNOSIS — F17200 Nicotine dependence, unspecified, uncomplicated: Secondary | ICD-10-CM | POA: Diagnosis not present

## 2021-10-31 DIAGNOSIS — Z86718 Personal history of other venous thrombosis and embolism: Secondary | ICD-10-CM | POA: Insufficient documentation

## 2021-10-31 DIAGNOSIS — I10 Essential (primary) hypertension: Secondary | ICD-10-CM | POA: Insufficient documentation

## 2021-10-31 DIAGNOSIS — Z7984 Long term (current) use of oral hypoglycemic drugs: Secondary | ICD-10-CM | POA: Insufficient documentation

## 2021-10-31 DIAGNOSIS — Z79899 Other long term (current) drug therapy: Secondary | ICD-10-CM | POA: Diagnosis not present

## 2021-10-31 DIAGNOSIS — Z923 Personal history of irradiation: Secondary | ICD-10-CM | POA: Insufficient documentation

## 2021-10-31 DIAGNOSIS — Z853 Personal history of malignant neoplasm of breast: Secondary | ICD-10-CM | POA: Diagnosis not present

## 2021-10-31 DIAGNOSIS — N135 Crossing vessel and stricture of ureter without hydronephrosis: Secondary | ICD-10-CM | POA: Diagnosis not present

## 2021-10-31 DIAGNOSIS — R7303 Prediabetes: Secondary | ICD-10-CM

## 2021-10-31 DIAGNOSIS — C539 Malignant neoplasm of cervix uteri, unspecified: Secondary | ICD-10-CM | POA: Diagnosis not present

## 2021-10-31 DIAGNOSIS — F419 Anxiety disorder, unspecified: Secondary | ICD-10-CM | POA: Diagnosis not present

## 2021-10-31 DIAGNOSIS — Z6839 Body mass index (BMI) 39.0-39.9, adult: Secondary | ICD-10-CM | POA: Insufficient documentation

## 2021-10-31 DIAGNOSIS — R599 Enlarged lymph nodes, unspecified: Secondary | ICD-10-CM | POA: Diagnosis not present

## 2021-10-31 DIAGNOSIS — E119 Type 2 diabetes mellitus without complications: Secondary | ICD-10-CM | POA: Diagnosis not present

## 2021-10-31 DIAGNOSIS — Z9221 Personal history of antineoplastic chemotherapy: Secondary | ICD-10-CM | POA: Insufficient documentation

## 2021-10-31 HISTORY — PX: CYSTOSCOPY: SHX5120

## 2021-10-31 LAB — TYPE AND SCREEN
ABO/RH(D): A POS
Antibody Screen: NEGATIVE

## 2021-10-31 LAB — GLUCOSE, CAPILLARY
Glucose-Capillary: 182 mg/dL — ABNORMAL HIGH (ref 70–99)
Glucose-Capillary: 208 mg/dL — ABNORMAL HIGH (ref 70–99)
Glucose-Capillary: 267 mg/dL — ABNORMAL HIGH (ref 70–99)

## 2021-10-31 SURGERY — LYMPHADENECTOMY, PELVIS, ROBOT-ASSISTED
Anesthesia: General | Site: Vagina

## 2021-10-31 MED ORDER — KCL IN DEXTROSE-NACL 20-5-0.45 MEQ/L-%-% IV SOLN
INTRAVENOUS | Status: DC
  Filled 2021-10-31 (×2): qty 1000

## 2021-10-31 MED ORDER — HYDROMORPHONE HCL 1 MG/ML IJ SOLN
0.5000 mg | INTRAMUSCULAR | Status: DC | PRN
  Administered 2021-10-31 – 2021-11-01 (×3): 0.5 mg via INTRAVENOUS
  Filled 2021-10-31 (×3): qty 0.5

## 2021-10-31 MED ORDER — OXYCODONE HCL 5 MG PO TABS
5.0000 mg | ORAL_TABLET | ORAL | Status: DC | PRN
  Administered 2021-10-31 – 2021-11-01 (×3): 5 mg via ORAL
  Filled 2021-10-31 (×3): qty 1

## 2021-10-31 MED ORDER — ROCURONIUM BROMIDE 10 MG/ML (PF) SYRINGE
PREFILLED_SYRINGE | INTRAVENOUS | Status: AC
  Filled 2021-10-31: qty 10

## 2021-10-31 MED ORDER — LACTATED RINGERS IR SOLN
Status: DC | PRN
  Administered 2021-10-31: 1000 mL

## 2021-10-31 MED ORDER — ONDANSETRON HCL 4 MG/2ML IJ SOLN: 4.0000 mg | Freq: Four times a day (QID) | INTRAMUSCULAR | Status: DC | PRN

## 2021-10-31 MED ORDER — DIPHENHYDRAMINE HCL 25 MG PO CAPS
25.0000 mg | ORAL_CAPSULE | Freq: Four times a day (QID) | ORAL | Status: DC | PRN
  Administered 2021-10-31: 25 mg via ORAL
  Filled 2021-10-31: qty 1

## 2021-10-31 MED ORDER — ENOXAPARIN SODIUM 40 MG/0.4ML IJ SOSY
40.0000 mg | PREFILLED_SYRINGE | INTRAMUSCULAR | Status: DC
  Administered 2021-11-01: 40 mg via SUBCUTANEOUS
  Filled 2021-10-31: qty 0.4

## 2021-10-31 MED ORDER — HYDROMORPHONE HCL 1 MG/ML IJ SOLN
INTRAMUSCULAR | Status: AC
  Filled 2021-10-31: qty 1

## 2021-10-31 MED ORDER — PHENYLEPHRINE HCL-NACL 20-0.9 MG/250ML-% IV SOLN
INTRAVENOUS | Status: DC | PRN
  Administered 2021-10-31: 20 ug/min via INTRAVENOUS

## 2021-10-31 MED ORDER — PHENYLEPHRINE HCL (PRESSORS) 10 MG/ML IV SOLN
INTRAVENOUS | Status: AC
  Filled 2021-10-31: qty 1

## 2021-10-31 MED ORDER — SUGAMMADEX SODIUM 200 MG/2ML IV SOLN
INTRAVENOUS | Status: DC | PRN
  Administered 2021-10-31: 300 mg via INTRAVENOUS

## 2021-10-31 MED ORDER — MIDAZOLAM HCL 5 MG/5ML IJ SOLN
INTRAMUSCULAR | Status: DC | PRN
  Administered 2021-10-31: 2 mg via INTRAVENOUS

## 2021-10-31 MED ORDER — LIDOCAINE 2% (20 MG/ML) 5 ML SYRINGE
INTRAMUSCULAR | Status: DC | PRN
  Administered 2021-10-31: 100 mg via INTRAVENOUS

## 2021-10-31 MED ORDER — INSULIN ASPART 100 UNIT/ML IJ SOLN
0.0000 [IU] | Freq: Three times a day (TID) | INTRAMUSCULAR | Status: DC
  Administered 2021-10-31: 5 [IU] via SUBCUTANEOUS
  Administered 2021-11-01: 3 [IU] via SUBCUTANEOUS

## 2021-10-31 MED ORDER — TRAMADOL HCL 50 MG PO TABS
50.0000 mg | ORAL_TABLET | Freq: Four times a day (QID) | ORAL | Status: DC | PRN
  Administered 2021-11-01: 50 mg via ORAL
  Filled 2021-10-31: qty 1

## 2021-10-31 MED ORDER — HEPARIN SODIUM (PORCINE) 5000 UNIT/ML IJ SOLN
5000.0000 [IU] | INTRAMUSCULAR | Status: AC
  Administered 2021-10-31: 5000 [IU] via SUBCUTANEOUS
  Filled 2021-10-31: qty 1

## 2021-10-31 MED ORDER — ORAL CARE MOUTH RINSE: 15.0000 mL | Freq: Once | OROMUCOSAL | Status: AC

## 2021-10-31 MED ORDER — KETAMINE HCL 10 MG/ML IJ SOLN
INTRAMUSCULAR | Status: DC | PRN
  Administered 2021-10-31: 30 mg via INTRAVENOUS

## 2021-10-31 MED ORDER — HYDROMORPHONE HCL 1 MG/ML IJ SOLN
0.2500 mg | INTRAMUSCULAR | Status: DC | PRN
  Administered 2021-10-31 (×4): 0.5 mg via INTRAVENOUS

## 2021-10-31 MED ORDER — DEXAMETHASONE SODIUM PHOSPHATE 4 MG/ML IJ SOLN
4.0000 mg | INTRAMUSCULAR | Status: AC
  Administered 2021-10-31: 4 mg via INTRAVENOUS

## 2021-10-31 MED ORDER — SENNOSIDES-DOCUSATE SODIUM 8.6-50 MG PO TABS
2.0000 | ORAL_TABLET | Freq: Every day | ORAL | Status: DC
  Administered 2021-10-31: 2 via ORAL
  Filled 2021-10-31: qty 2

## 2021-10-31 MED ORDER — PROPOFOL 10 MG/ML IV BOLUS
INTRAVENOUS | Status: AC
  Filled 2021-10-31: qty 20

## 2021-10-31 MED ORDER — LIDOCAINE HCL (PF) 2 % IJ SOLN
INTRAMUSCULAR | Status: DC | PRN
  Administered 2021-10-31: 1.5 mg/kg/h via INTRADERMAL

## 2021-10-31 MED ORDER — MEPERIDINE HCL 50 MG/ML IJ SOLN: 6.2500 mg | INTRAMUSCULAR | Status: DC | PRN

## 2021-10-31 MED ORDER — BUPIVACAINE HCL 0.25 % IJ SOLN
INTRAMUSCULAR | Status: AC
  Filled 2021-10-31: qty 1

## 2021-10-31 MED ORDER — ACETAMINOPHEN 500 MG PO TABS
1000.0000 mg | ORAL_TABLET | Freq: Four times a day (QID) | ORAL | Status: DC
  Administered 2021-10-31 – 2021-11-01 (×2): 1000 mg via ORAL
  Filled 2021-10-31 (×2): qty 2

## 2021-10-31 MED ORDER — SCOPOLAMINE 1 MG/3DAYS TD PT72
1.0000 | MEDICATED_PATCH | TRANSDERMAL | Status: DC
  Administered 2021-10-31: 1.5 mg via TRANSDERMAL
  Filled 2021-10-31: qty 1

## 2021-10-31 MED ORDER — DESMOPRESSIN ACETATE 0.1 MG PO TABS
200.0000 ug | ORAL_TABLET | Freq: Every day | ORAL | Status: DC
  Filled 2021-10-31: qty 1

## 2021-10-31 MED ORDER — MIDAZOLAM HCL 2 MG/2ML IJ SOLN
INTRAMUSCULAR | Status: AC
Start: 2021-10-31 — End: ?
  Filled 2021-10-31: qty 2

## 2021-10-31 MED ORDER — ACETAMINOPHEN 500 MG PO TABS
1000.0000 mg | ORAL_TABLET | ORAL | Status: AC
  Administered 2021-10-31: 1000 mg via ORAL
  Filled 2021-10-31: qty 2

## 2021-10-31 MED ORDER — CHLORHEXIDINE GLUCONATE 0.12 % MT SOLN
15.0000 mL | Freq: Once | OROMUCOSAL | Status: AC
  Administered 2021-10-31: 15 mL via OROMUCOSAL

## 2021-10-31 MED ORDER — ONDANSETRON HCL 4 MG/2ML IJ SOLN
INTRAMUSCULAR | Status: AC
  Filled 2021-10-31: qty 2

## 2021-10-31 MED ORDER — STERILE WATER FOR IRRIGATION IR SOLN
Status: DC | PRN
  Administered 2021-10-31: 1000 mL

## 2021-10-31 MED ORDER — CELECOXIB 200 MG PO CAPS
200.0000 mg | ORAL_CAPSULE | ORAL | Status: AC
  Administered 2021-10-31: 200 mg via ORAL
  Filled 2021-10-31: qty 1

## 2021-10-31 MED ORDER — SURGIFLO WITH THROMBIN (HEMOSTATIC MATRIX KIT) OPTIME
TOPICAL | Status: DC | PRN
  Administered 2021-10-31: 1

## 2021-10-31 MED ORDER — ONDANSETRON HCL 4 MG/2ML IJ SOLN
INTRAMUSCULAR | Status: DC | PRN
  Administered 2021-10-31: 4 mg via INTRAVENOUS

## 2021-10-31 MED ORDER — DEXAMETHASONE SODIUM PHOSPHATE 10 MG/ML IJ SOLN
INTRAMUSCULAR | Status: AC
  Filled 2021-10-31: qty 1

## 2021-10-31 MED ORDER — BUPIVACAINE HCL 0.25 % IJ SOLN
INTRAMUSCULAR | Status: DC | PRN
  Administered 2021-10-31: 32 mL

## 2021-10-31 MED ORDER — SUCCINYLCHOLINE CHLORIDE 200 MG/10ML IV SOSY
PREFILLED_SYRINGE | INTRAVENOUS | Status: AC
  Filled 2021-10-31: qty 10

## 2021-10-31 MED ORDER — FENTANYL CITRATE (PF) 100 MCG/2ML IJ SOLN
INTRAMUSCULAR | Status: DC | PRN
  Administered 2021-10-31: 100 ug via INTRAVENOUS
  Administered 2021-10-31 (×2): 25 ug via INTRAVENOUS
  Administered 2021-10-31 (×2): 50 ug via INTRAVENOUS

## 2021-10-31 MED ORDER — KETAMINE HCL 50 MG/5ML IJ SOSY
PREFILLED_SYRINGE | INTRAMUSCULAR | Status: AC
  Filled 2021-10-31: qty 5

## 2021-10-31 MED ORDER — CEFAZOLIN SODIUM-DEXTROSE 2-4 GM/100ML-% IV SOLN
2.0000 g | INTRAVENOUS | Status: AC
  Administered 2021-10-31: 2 g via INTRAVENOUS
  Filled 2021-10-31: qty 100

## 2021-10-31 MED ORDER — HEMOSTATIC AGENTS (NO CHARGE) OPTIME
TOPICAL | Status: DC | PRN
  Administered 2021-10-31: 1

## 2021-10-31 MED ORDER — INSULIN ASPART 100 UNIT/ML IJ SOLN
INTRAMUSCULAR | Status: AC
  Filled 2021-10-31: qty 1

## 2021-10-31 MED ORDER — PROMETHAZINE HCL 25 MG/ML IJ SOLN: 6.2500 mg | INTRAMUSCULAR | Status: DC | PRN

## 2021-10-31 MED ORDER — METFORMIN HCL ER 500 MG PO TB24
500.0000 mg | ORAL_TABLET | Freq: Every day | ORAL | Status: DC
  Administered 2021-11-01: 500 mg via ORAL
  Filled 2021-10-31: qty 1

## 2021-10-31 MED ORDER — GABAPENTIN 300 MG PO CAPS
300.0000 mg | ORAL_CAPSULE | ORAL | Status: AC
  Administered 2021-10-31: 300 mg via ORAL
  Filled 2021-10-31: qty 1

## 2021-10-31 MED ORDER — FENTANYL CITRATE (PF) 250 MCG/5ML IJ SOLN
INTRAMUSCULAR | Status: AC
  Filled 2021-10-31: qty 5

## 2021-10-31 MED ORDER — INSULIN ASPART 100 UNIT/ML IJ SOLN
0.0000 [IU] | Freq: Every day | INTRAMUSCULAR | Status: DC
  Administered 2021-10-31: 3 [IU] via SUBCUTANEOUS

## 2021-10-31 MED ORDER — ROCURONIUM BROMIDE 10 MG/ML (PF) SYRINGE
PREFILLED_SYRINGE | INTRAVENOUS | Status: DC | PRN
  Administered 2021-10-31: 30 mg via INTRAVENOUS
  Administered 2021-10-31: 70 mg via INTRAVENOUS
  Administered 2021-10-31: 50 mg via INTRAVENOUS
  Administered 2021-10-31: 20 mg via INTRAVENOUS

## 2021-10-31 MED ORDER — LACTATED RINGERS IV SOLN: INTRAVENOUS | Status: DC | PRN

## 2021-10-31 MED ORDER — PHENYLEPHRINE HCL (PRESSORS) 10 MG/ML IV SOLN
INTRAVENOUS | Status: AC
Start: 2021-10-31 — End: ?
  Filled 2021-10-31: qty 1

## 2021-10-31 MED ORDER — SODIUM CHLORIDE (PF) 0.9 % IJ SOLN
INTRAMUSCULAR | Status: AC
Start: 2021-10-31 — End: ?
  Filled 2021-10-31: qty 20

## 2021-10-31 MED ORDER — LABETALOL HCL 5 MG/ML IV SOLN: 10.0000 mg | INTRAVENOUS | Status: DC | PRN

## 2021-10-31 MED ORDER — PROPOFOL 10 MG/ML IV BOLUS
INTRAVENOUS | Status: DC | PRN
  Administered 2021-10-31: 200 mg via INTRAVENOUS

## 2021-10-31 MED ORDER — ONDANSETRON HCL 4 MG PO TABS: 4.0000 mg | ORAL_TABLET | Freq: Four times a day (QID) | ORAL | Status: DC | PRN

## 2021-10-31 MED ORDER — LACTATED RINGERS IV SOLN: INTRAVENOUS | Status: DC

## 2021-10-31 SURGICAL SUPPLY — 77 items
APPLICATOR ARISTA FLEXITIP XL (MISCELLANEOUS) ×1 IMPLANT
APPLICATOR SURGIFLO ENDO (HEMOSTASIS) ×1 IMPLANT
BAG LAPAROSCOPIC 12 15 PORT 16 (BASKET) IMPLANT
BAG RETRIEVAL 12/15 (BASKET) ×3
BLADE SURG SZ10 CARB STEEL (BLADE) IMPLANT
COVER BACK TABLE 60X90IN (DRAPES) ×3 IMPLANT
COVER TIP SHEARS 8 DVNC (MISCELLANEOUS) ×2 IMPLANT
COVER TIP SHEARS 8MM DA VINCI (MISCELLANEOUS) ×1
DERMABOND ADVANCED (GAUZE/BANDAGES/DRESSINGS) ×1
DERMABOND ADVANCED .7 DNX12 (GAUZE/BANDAGES/DRESSINGS) ×2 IMPLANT
DRAPE ARM DVNC X/XI (DISPOSABLE) ×8 IMPLANT
DRAPE COLUMN DVNC XI (DISPOSABLE) ×2 IMPLANT
DRAPE DA VINCI XI ARM (DISPOSABLE) ×4
DRAPE DA VINCI XI COLUMN (DISPOSABLE) ×1
DRAPE SHEET LG 3/4 BI-LAMINATE (DRAPES) ×3 IMPLANT
DRAPE SURG IRRIG POUCH 19X23 (DRAPES) ×3 IMPLANT
DRSG OPSITE POSTOP 4X6 (GAUZE/BANDAGES/DRESSINGS) IMPLANT
DRSG OPSITE POSTOP 4X8 (GAUZE/BANDAGES/DRESSINGS) IMPLANT
DRSG TELFA 3X8 NADH (GAUZE/BANDAGES/DRESSINGS) ×3 IMPLANT
ELECT PENCIL ROCKER SW 15FT (MISCELLANEOUS) IMPLANT
ELECT REM PT RETURN 15FT ADLT (MISCELLANEOUS) ×3 IMPLANT
GAUZE 4X4 16PLY ~~LOC~~+RFID DBL (SPONGE) ×5 IMPLANT
GLOVE BIO SURGEON STRL SZ 6 (GLOVE) ×12 IMPLANT
GLOVE BIO SURGEON STRL SZ 6.5 (GLOVE) ×3 IMPLANT
GOWN STRL REUS W/ TWL LRG LVL3 (GOWN DISPOSABLE) ×8 IMPLANT
GOWN STRL REUS W/TWL LRG LVL3 (GOWN DISPOSABLE) ×4
HEMOSTAT ARISTA ABSORB 3G PWDR (HEMOSTASIS) ×1 IMPLANT
HOLDER FOLEY CATH W/STRAP (MISCELLANEOUS) IMPLANT
IRRIG SUCT STRYKERFLOW 2 WTIP (MISCELLANEOUS) ×3
IRRIGATION SUCT STRKRFLW 2 WTP (MISCELLANEOUS) ×2 IMPLANT
KIT PROCEDURE DA VINCI SI (MISCELLANEOUS)
KIT PROCEDURE DVNC SI (MISCELLANEOUS) IMPLANT
KIT TURNOVER KIT A (KITS) IMPLANT
LIGASURE IMPACT 36 18CM CVD LR (INSTRUMENTS) IMPLANT
MANIPULATOR ADVINCU DEL 3.0 PL (MISCELLANEOUS) IMPLANT
MANIPULATOR ADVINCU DEL 3.5 PL (MISCELLANEOUS) IMPLANT
MANIPULATOR UTERINE 4.5 ZUMI (MISCELLANEOUS) IMPLANT
NDL HYPO 21X1.5 SAFETY (NEEDLE) ×2 IMPLANT
NDL SPNL 18GX3.5 QUINCKE PK (NEEDLE) IMPLANT
NEEDLE HYPO 21X1.5 SAFETY (NEEDLE) ×3 IMPLANT
NEEDLE SPNL 18GX3.5 QUINCKE PK (NEEDLE) IMPLANT
OBTURATOR OPTICAL STANDARD 8MM (TROCAR) ×1
OBTURATOR OPTICAL STND 8 DVNC (TROCAR) ×2
OBTURATOR OPTICALSTD 8 DVNC (TROCAR) ×2 IMPLANT
PACK ROBOT GYN CUSTOM WL (TRAY / TRAY PROCEDURE) ×3 IMPLANT
PAD DRESSING TELFA 3X8 NADH (GAUZE/BANDAGES/DRESSINGS) IMPLANT
PAD POSITIONING PINK XL (MISCELLANEOUS) ×3 IMPLANT
PORT ACCESS TROCAR AIRSEAL 12 (TROCAR) ×2 IMPLANT
PORT ACCESS TROCAR AIRSEAL 5M (TROCAR) ×1
PUNCH BIOPSY 3 (MISCELLANEOUS) ×1 IMPLANT
SCRUB CHG 4% DYNA-HEX 4OZ (MISCELLANEOUS) ×6 IMPLANT
SEAL CANN UNIV 5-8 DVNC XI (MISCELLANEOUS) ×8 IMPLANT
SEAL XI 5MM-8MM UNIVERSAL (MISCELLANEOUS) ×4
SET TRI-LUMEN FLTR TB AIRSEAL (TUBING) ×3 IMPLANT
SPIKE FLUID TRANSFER (MISCELLANEOUS) ×3 IMPLANT
SPONGE T-LAP 18X18 ~~LOC~~+RFID (SPONGE) IMPLANT
SURGIFLO W/THROMBIN 8M KIT (HEMOSTASIS) ×1 IMPLANT
SUT MNCRL AB 4-0 PS2 18 (SUTURE) IMPLANT
SUT PDS AB 1 TP1 96 (SUTURE) IMPLANT
SUT VIC AB 0 CT1 27 (SUTURE)
SUT VIC AB 0 CT1 27XBRD ANTBC (SUTURE) IMPLANT
SUT VIC AB 2-0 CT1 27 (SUTURE)
SUT VIC AB 2-0 CT1 TAPERPNT 27 (SUTURE) IMPLANT
SUT VIC AB 4-0 PS2 18 (SUTURE) ×6 IMPLANT
SUT VLOC 180 0 9IN  GS21 (SUTURE) ×1
SUT VLOC 180 0 9IN GS21 (SUTURE) IMPLANT
SYR 10ML LL (SYRINGE) IMPLANT
SYS BAG RETRIEVAL 10MM (BASKET) ×15
SYS WOUND ALEXIS 18CM MED (MISCELLANEOUS)
SYSTEM BAG RETRIEVAL 10MM (BASKET) IMPLANT
SYSTEM WOUND ALEXIS 18CM MED (MISCELLANEOUS) IMPLANT
TOWEL OR NON WOVEN STRL DISP B (DISPOSABLE) IMPLANT
TRAP SPECIMEN MUCUS 40CC (MISCELLANEOUS) IMPLANT
TRAY FOLEY MTR SLVR 16FR STAT (SET/KITS/TRAYS/PACK) ×3 IMPLANT
UNDERPAD 30X36 HEAVY ABSORB (UNDERPADS AND DIAPERS) ×6 IMPLANT
WATER STERILE IRR 1000ML POUR (IV SOLUTION) ×3 IMPLANT
YANKAUER SUCT BULB TIP 10FT TU (MISCELLANEOUS) IMPLANT

## 2021-10-31 NOTE — Op Note (Signed)
OPERATIVE NOTE  Pre-operative Diagnosis: Stage Ib1 SCC of the cervix, close radial margin   Post-operative Diagnosis: same, retroperitoneal fibrosis  Operation: Robotic-assisted bilateral pelvic lymphadenectomy, bilateral ureterolysis, radical parametrectomy and upper vaginectomy, vulvar biopsy Extreme morbid obesity requiring additional OR personnel for positioning and retraction. Obesity made retroperitoneal visualization limited and increased the complexity of the case and necessitated additional instrumentation for retraction. Obesity related complexity increased the duration of the procedure by 45 minutes.   Surgeon: Jeral Pinch MD  Assistant Surgeon: Lahoma Crocker MD (an MD assistant was necessary for tissue manipulation, management of robotic instrumentation, retraction and positioning due to the complexity of the case and hospital policies).   Anesthesia: GET  Urine Output: 150 cc  Operative Findings:  : On sternal exam, small, 2 cm area of hyperpigmentation noted along the right perineum, not raised or nodular.  3 mm punch biopsy taken of this.  Cuff intact with no nodularity on EUA.  On intra-abdominal entry, normal upper abdominal survey including diaphragm, liver edge, and stomach.  Some adhesions of the omentum to the right lateral abdominal wall as well as filmy adhesions to the umbilicus, just below the level of prior port placement from recent surgery.  Omentum otherwise normal-appearing.  Normal-appearing small and large bowel.  Fibrosis of the retroperitoneum, left greater than right in the setting of recent surgery.  Some enlarged and suspicious appearing lymph nodes along the left external iliac, several sent for frozen section with sclerotic lymph nodes noted, no malignancy.  Some filmy adhesions of the sigmoid epiploica to the vaginal cuff and right pelvic sidewall.  Prior vaginal cuff suture visible and excised.  No frank tumor involvement of the vaginal cuff or  parametria. On cystoscopy, bladder dome intact, good efflux noted from bilateral ureteral orifices.  Estimated Blood Loss:  100 cc      Total IV Fluids: see I&O flowsheet         Specimens: Right perineal biopsy, bilateral pelvic lymph nodes (sent together as bags were combined after removed vaginally), right and left parametrium with accompanying vaginal apex, anterior vagina, posterior vagina         Complications:  None; patient tolerated the procedure well.         Disposition: PACU - hemodynamically stable.  Procedure Details  The patient was seen in the Holding Room. The risks, benefits, complications, treatment options, and expected outcomes were discussed with the patient.  The patient concurred with the proposed plan, giving informed consent.  The site of surgery properly noted/marked. The patient was identified as [name] and the procedure verified as a Robotic-assisted bilateral pelvic lymph nodes, possible radical parametrectomy, other indicated procedures.  After induction of anesthesia, the patient was draped and prepped in the usual sterile manner. Patient was placed in supine position after anesthesia and draped and prepped in the usual sterile manner as follows: Her arms were tucked to her side with all appropriate precautions.  The shoulders were stabilized with padded shoulder blocks applied to the acromium processes.  The patient was placed in the semi-lithotomy position in [place].  The perineum and vagina were prepped with CholoraPrep. The patient was draped after the CholoraPrep had been allowed to dry for 3 minutes.  A Time Out was held and the above information confirmed.  The urethra was prepped with Betadine. Foley catheter was placed.  An EEA sizer was placed within the vagina. OG tube placement was confirmed and to suction.   Next, a 10 mm skin  incision was made 1 cm below the subcostal margin in the midclavicular line.  The 5 mm Optiview port and scope  was used for direct entry.  Opening pressure was under 10 mm CO2.  The abdomen was insufflated and the findings were noted as above.   At this point and all points during the procedure, the patient's intra-abdominal pressure did not exceed 15 mmHg. Next, an 8 mm skin incision was made superior to the umbilicus and a right and left port were placed about 8 cm lateral to the robot port on the right and left side.  A fourth arm was placed on the right.  The 5 mm assist trocar was exchanged for a 10-12 mm port. All ports were placed under direct visualization.  Adhesions of the omentum to the anterior abdominal wall just below the umbilicus were lysed with her scopic scissors.  The patient was placed in steep Trendelenburg.  Bowel was folded away into the upper abdomen.  The robot was docked in the normal manner.  Attention was first turned to the small bowel, which was somewhat adherent at the level of the pelvic brim.  This was mobilized using sharp dissection.  The right retroperitoneum in the pelvis was opened parallel to the IP ligament. The right paravesical space was developed with monopolar and sharp dissection. It was held open with tension on the median umbilical ligament with the forth arm. The pararectal space was opened with blunt and sharp dissection to mobilize the ureter off of the medial surface of the internal iliac artery. The medial leaf of the broad ligament containing the ureter was held medially (opening the pararectal space) by the assistant's grasper. The right pelvic lymphadenectomy was performed by skeletonizing the internal iliac artery at the bifurcation with the external iliac artery. The obturator nerve was identified in the base of lateral paravesical space. The ureter was mobilized medially off of the dissection by developing the pararectal space. The genitofemoral nerve was identified, skeletonized and mobilized laterally off of the external iliac artery. An enbloc resection of  lymph nodes was performed within the following boundaries: the mid portion of the common iliac proximally, the circumflex iliac vein distally, the obturator nerve posteriorally, the genitofemoral nerve laterally. The nodal basin (including obturator space) were confirmed to be empty of nodes and hemostatic. The nodes were placed in an endocatch bag and retrieved at the end of the procedure vaginally.  The same procedure with the same steps was performed on the patient's left side. The left retroperitoneum in the pelvis was opened parallel to the IP ligament. The left paravesical space was developed with monopolar and sharp dissection. It was held open with tension on the median umbilical ligament with the forth arm. The pararectal space was opened with blunt and sharp dissection to mobilize the ureter off of the medial surface of the internal iliac artery. The medial leaf of the broad ligament containing the ureter was held medially (opening the pararectal space) by the assistant's grasper. The left pelvic lymphadenectomy was performed by skeletonizing the internal iliac artery at the bifurcation with the external iliac artery. The obturator nerve was identified in the base of lateral paravesical space. The ureter was mobilized medially off of the dissection by developing the pararectal space. The genitofemoral nerve was identified, skeletonized and mobilized laterally off of the external iliac artery. An enbloc resection of lymph nodes was performed within the following boundaries: the mid portion of the common iliac proximally, the circumflex iliac vein distally,  the obturator nerve posteriorally, the genitofemoral nerve laterally. The nodal basin (including obturator space) were confirmed to be empty of nodes and hemostatic.  Given some enlarged nodes along the left external iliac artery, several lymph nodes were sent for frozen section.  The remainder of the nodes were placed in an endocatch bag and retrieved  at the end of the procedure vaginally.  Once the frozen section had return, the remainder of the procedure was continued.  The radical parametrectomy was begun by first skeletonizing the right uterine artery at its origin from the internal iliac artery by skeletonizing it and defining the parauterine web between the paravesical and pararectal spaces. The right ureter was skeletonized off of its attachments to the broad ligament and mobilized laterally. Using meticulous blunt and sparing monopolar dissection in short controlled bursts, the right ureter was untunnelled from under the right uterine artery.  With an EEA sizer in the vagina, the vaginal cuff was opened at the apex to help create and developed the bladder flap.  The bladder Was then developed approximately 2 cm below the colpotomy incision.  The anterior vessicouterine ligament was developed anterior bladder pleural or was defined. The uterine artery was bipolar fulgarated to seal it, then transected. The uterine vein was also sealed and resected. The lateral boundary of the parametrium was taken down with biploar and monopolar dissection to the level of the deep vaginal vein. The uterine vessels were retracted superior and medially over the ureter on the right. The ureter was untunnelled through to its entry into the bladder with meticulous sharp dissection and short bursts of monopolar energy. The ureter was dissected off of its attachments to the anterior vagina. The anterior bladder pillar was skeletonized and sealed with bipolar energy while the ureter was deflected distally. The posterior bladder pillar was also dissected with monopolar scissors from the upper vagina.  The rectovaginal septum was entered posteriorally with sharp dissection and the rectum was dissected off of the vagina. A window was created in the broad ligament with care to mobilize the ureter laterally. The right uterosacral ligament was transected with bipolar and monopolar  energy approximately 1 cm from the vaginal cuff. In doing so meticulous attention was made to identify and wherever possible, spare the hypogastric nerves. The right paravaginal tissues were tubularized around the vagina on the right at the inferior boundary of the dissection.  The right parametria and paravaginal tissue was then excised using monopolar electrocautery.  Was placed in an Endo Catch bag.  The same procedure was then performed on the left.  The left parametrectomy was begun by first skeletonizing the right uterine artery at its origin from the internal iliac artery by skeletonizing it and defining the parauterine web between the paravesical and pararectal spaces. The left ureter was skeletonized off of its attachments to the broad ligament and mobilized laterally. Using meticulous blunt and sparing monopolar dissection in short controlled bursts, the left ureter was untunnelled from under the left uterine artery.  With an EEA sizer in the vagina, the vaginal cuff was opened at the apex to help create and developed the bladder flap.  The bladder was then developed approximately 2 cm below the colpotomy incision.  The anterior vessicouterine ligament was developed anterior bladder pleural or was defined. The uterine artery was bipolar fulgarated to seal it, then transected. The uterine vein was also sealed and resected. The lateral boundary of the parametrium was taken down with biploar and monopolar dissection to the level of the  deep vaginal vein. The uterine vessels were retracted superior and medially over the ureter on the left. The ureter was untunnelled through to its entry into the bladder with meticulous sharp dissection and short bursts of monopolar energy. The ureter was dissected off of its attachments to the anterior vagina. The anterior bladder pillar was skeletonized and sealed with bipolar energy while the ureter was deflected distally. The posterior bladder pillar was also dissected  with monopolar scissors from the upper vagina.  The rectovaginal septum was entered posteriorally with sharp dissection and the rectum was dissected off of the vagina. A window was created in the broad ligament with care to mobilize the ureter laterally. The left uterosacral ligament was transected with bipolar and monopolar energy approximately 1 cm from the vaginal cuff. In doing so meticulous attention was made to identify and wherever possible, spare the hypogastric nerves. The left paravaginal tissues were tubularized around the vagina on the left at the inferior boundary of the dissection. The left parametria and paravaginal tissue was then excised using monopolar electrocautery.  Placed in an Endo Catch bag.  An anterior and posterior vaginal margin were then taken and handed out through the vagina.  The 4 Endo Catch bags including left and right parametria with left and right vaginal apex as well as left and right pelvic lymph nodes were handed out through the vagina.  The colpotomy at the vaginal cuff was closed with 0 V-Loc in running manner.  Irrigation was used and excellent hemostasis was achieved.  Surgiflo and Arista were placed in the beds of the pelvic lymph node dissections.  Cystoscopy was performed with findings noted above.  After cystoscopy completed, Foley catheter replaced.  At this point in the procedure was completed.  Robotic instruments were removed under direct visulaization.  The robot was undocked. The fascia at the 10-12 mm port was closed with 0 Vicryl on a UR-5 needle.  The subcuticular tissue was closed with 4-0 Vicryl and the skin was closed with 4-0 Monocryl in a subcuticular manner.  Dermabond was applied.    The vagina was swabbed with minimal bleeding noted.  All sponge, lap and needle counts were correct x  3.   The patient was transferred to the recovery room in stable condition.  Jeral Pinch, MD

## 2021-10-31 NOTE — Anesthesia Procedure Notes (Signed)
Procedure Name: Intubation Date/Time: 10/31/2021 11:29 AM  Performed by: Cleda Daub, CRNAPre-anesthesia Checklist: Patient identified, Emergency Drugs available, Suction available and Patient being monitored Patient Re-evaluated:Patient Re-evaluated prior to induction Oxygen Delivery Method: Circle system utilized Preoxygenation: Pre-oxygenation with 100% oxygen Induction Type: IV induction Ventilation: Mask ventilation without difficulty and Oral airway inserted - appropriate to patient size Laryngoscope Size: Glidescope and 3 Grade View: Grade I Tube type: Oral Tube size: 7.0 mm Number of attempts: 1 Airway Equipment and Method: Stylet and Oral airway Placement Confirmation: ETT inserted through vocal cords under direct vision, positive ETCO2 and breath sounds checked- equal and bilateral Secured at: 22 cm Tube secured with: Tape Dental Injury: Teeth and Oropharynx as per pre-operative assessment

## 2021-10-31 NOTE — Transfer of Care (Signed)
Immediate Anesthesia Transfer of Care Note  Patient: [name]  Procedure(s) Performed: XI ROBOTIC ASSISTED BILATERAL PELVIC LYMPH NODE DISSECTION, radical parametrectomy, upper vaginectomy, vulvar biopsy (Abdomen) CYSTOSCOPY (Vagina )  Patient Location: PACU  Anesthesia Type:General  Level of Consciousness: awake, alert , oriented and patient cooperative  Airway & Oxygen Therapy: Patient Spontanous Breathing and Patient connected to face mask oxygen  Post-op Assessment: Report given to RN and Post -op Vital signs reviewed and stable  Post vital signs: Reviewed and stable  Last Vitals:  Vitals Value Taken Time  BP 146/91 10/31/21 1543  Temp 36.6 C 10/31/21 1543  Pulse 84 10/31/21 1546  Resp 14 10/31/21 1546  SpO2 98 % 10/31/21 1546  Vitals shown include unvalidated device data.  Last Pain:  Vitals:   10/31/21 0936  TempSrc:   PainSc: 0-No pain         Complications: No notable events documented.

## 2021-10-31 NOTE — Discharge Instructions (Signed)
AFTER SURGERY INSTRUCTIONS   Return to work: 4-6 weeks if applicable  YOU RECEIVED YOUR LOVENOX INJECTION FOR TODAY IN THE HOSPITAL SO PLAN TO START SHOTS AT [place], AUGUST 17.   You have a foley catheter in place for one week since this procedure can affect the nerves going to the bladder. The foley catheter will allow the bladder to rest while the nerves and surrounding area are healing. We will see you back in the office in one week for foley removal and a voiding trial.   For any discomfort on the vulva: We recommend purchasing several bags of frozen green peas and dividing them into ziploc bags. You will want to keep these in the freezer and have them ready to use as ice packs to the vulva bruised area. Once the ice pack is no longer cold, you can get another from the freezer. The frozen peas mold to your body better than a regular ice pack.    Activity: 1. Be up and out of the bed during the day.  Take a nap if needed.  You may walk up steps but be careful and use the hand rail.  Stair climbing will tire you more than you think, you may need to stop part way and rest.    2. No lifting or straining for 6 weeks over 10 pounds. No pushing, pulling, straining for 6 weeks.   3. No driving for around 1 week(s).  Do not drive if you are taking narcotic pain medicine and make sure that your reaction time has returned.    4. You can shower as soon as the next day after surgery. Shower daily.  Use your regular soap and water (not directly on the incision) and pat your incision(s) dry afterwards; don't rub.  No tub baths or submerging your body in water until cleared by your surgeon. If you have the soap that was given to you by pre-surgical testing that was used before surgery, you do not need to use it afterwards because this can irritate your incisions.    5. No sexual activity and nothing in the vagina for 8 weeks.   6. You may experience a small amount of clear drainage from your  incisions, which is normal.  If the drainage persists, increases, or changes color please call the office.   7. Do not use creams, lotions, or ointments such as neosporin on your incisions after surgery until advised by your surgeon because they can cause removal of the dermabond glue on your incisions.     8. You may experience vaginal spotting after surgery or around the 6-8 week mark from surgery when the stitches at the top of the vagina begin to dissolve.  The spotting is normal but if you experience heavy bleeding, call our office.   9. Take Tylenol or ibuprofen first for pain if you are able to take these medications and only use narcotic pain medication for severe pain not relieved by the Tylenol or Ibuprofen.  Monitor your Tylenol intake to a max of 4,000 mg in a 24 hour period. You can alternate these medications after surgery.   Diet: 1. Low sodium Heart Healthy Diet is recommended but you are cleared to resume your normal (before surgery) diet after your procedure.   2. It is safe to use a laxative, such as Miralax or Colace, if you have difficulty moving your bowels. You have been prescribed Sennakot-S to take at bedtime every evening after surgery to keep  bowel movements regular and to prevent constipation.     Wound Care: 1. Keep clean and dry.  Shower daily.   Reasons to call the Doctor: Fever - Oral temperature greater than 100.4 degrees Fahrenheit Foul-smelling vaginal discharge Difficulty urinating Nausea and vomiting Increased pain at the site of the incision that is unrelieved with pain medicine. Difficulty breathing with or without chest pain New calf pain especially if only on one side Sudden, continuing increased vaginal bleeding with or without clots.   Contacts: For questions or concerns you should contact:   Dr. Jeral Pinch at [phone]   [name], NP at [phone]   After Hours: call [phone] and have the GYN Oncologist paged/contacted  (after 5 pm or on the weekends).   Messages sent via mychart are for non-urgent matters and are not responded to after hours so for urgent needs, please call the after hours number.

## 2021-10-31 NOTE — H&P (Signed)
Gynecologic Oncology H&P  10/31/21  Treatment History: She has a history of an high risk HPV positive Pap smear in the setting of a negative cytology in July 2014 which was followed up with a negative colposcopic examination. She is no other history of abnormal Pap smears, or cervical procedures. She has been amenorrheic since September of 2014 during chemotherapy treatment (including no resumption of menses after completion of chemotherapy).    10/2013: Pap - normal, HR HPV+.   12/2013: Cervical biopsy and ECC - LSIL.  01/2014: BSO. LEEP - no dysplasia or malignancy, koilocytic changes.  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 09/12/21: TRH for CIN3, fibroids. 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 LVI negative.   10/05/21: PET IMPRESSION: 1. No evidence of residual pelvic tumor or metastatic disease status post hysterectomy. 2. Hepatic steatosis. 3. Postsurgical and post radiation changes in the left breast and left upper lobe. 4. Prominent calculi within the left renal pelvis and lower pole calyces.  Interval History: Doing well.  Past Medical/Surgical History: Past Medical History:  Diagnosis Date   Abnormal Pap smear of cervix    06-09-21 HGSIL HPV HR +, 16+, 18/45 neg   Anxiety    Breast cancer ([facility]/[place]/2014   left, lower medial, 8 o'clock   Cervical cancer (HCC)    Diabetes mellitus without complication (HCC)    Type 2, Pt takes Metformin.   DVT (deep venous thrombosis) ([place]   RT LEG , during breast cancer treatment   Family history of anesthesia complication    pt's mother has hx. of post-op N/V   History of kidney stones    Incidental finding on PET Scan   History of radiation therapy 07/06/13-08/20/13   left breast/   History of transfusion    x2 during chemotherapy   Hypertension     under control with med., has been on med. since 09/2012   Pain in the wrist    BILATERAL   Peripheral neuropathy    numbness in toes due to chemotherapy   Personal history of chemotherapy 2014   Red devil & 5FU, 8 sessions and Taxol 12 sessions   Personal history of radiation therapy 2014   07/06/13 - 08/20/13   Status post chemotherapy    Patient completed 7 years of Letrozole in 08/2020.   Urinary incontinence in female    follows with Urology    Past Surgical History:  Procedure Laterality Date   ABDOMINAL HYSTERECTOMY  09/12/2021   BREAST BIOPSY Right 11/2017   benign   BREAST LUMPECTOMY Left 2014   BREAST LUMPECTOMY WITH NEEDLE LOCALIZATION AND AXILLARY SENTINEL LYMPH NODE BX Left 12/10/2012   Procedure: BREAST LUMPECTOMY WITH NEEDLE LOCALIZATION AND AXILLARY SENTINEL LYMPH NODE BX;  Surgeon: PaMerrie RoofMD;  [place] [facility] Service: General;  Laterality: Left;  Needle loc BCG 7:30  nuc med 9:30     [place]   LEEP N/A 01/26/2014   Procedure: LOOP ELECTROSURGICAL EXCISION PROCEDURE (LEEP);  Surgeon: PaLucita LoraGeAlycia RossettiMD;  Location: [facility];  Service: Gynecology;  Laterality: N/A;   OPEN REDUCTION INTERNAL FIXATION (ORIF) TIBIA/FIBULA FRACTURE Right 2013   PORT-A-CATH REMOVAL  2015   PORTACATH PLACEMENT Right 01/09/2013  Procedure: INSERTION PORT-A-CATH;  Surgeon: Merrie Roof, MD;  Location: [place];  Service: General;  Laterality: Right;   ROBOTIC ASSISTED BILATERAL SALPINGO OOPHERECTOMY Bilateral 01/26/2014   Procedure: ROBOTIC ASSISTED BILATERAL SALPINGO OOPHORECTOMY;  Surgeon: Imagene Gurney A. Alycia Rossetti, MD;  Location: [facility];  Service: Gynecology;  Laterality: Bilateral;   ROBOTIC ASSISTED LAPAROSCOPIC HYSTERECTOMY AND SALPINGECTOMY Bilateral 09/12/2021   Procedure: XI ROBOTIC ASSISTED LAPAROSCOPIC HYSTERECTOMY;  Surgeon: Princess Bruins, MD;  Location: [place];  Service:  Gynecology;  Laterality: Bilateral;   TONSILLECTOMY AND ADENOIDECTOMY     as a teenager    Family History  Problem Relation Age of Onset   Anesthesia problems Mother        post-op N/V   Breast cancer Maternal Grandmother        50-60s   Lung cancer Paternal Grandmother    Colon cancer Neg Hx    Ovarian cancer Neg Hx    Endometrial cancer Neg Hx    Pancreatic cancer Neg Hx    Prostate cancer Neg Hx     Social History   Socioeconomic History   Marital status: Divorced    Spouse name: Not on file   Number of children: Not on file   Years of education: Not on file   Highest education level: Not on file  Occupational History   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  Vaping Use   Vaping Use: Never used  Substance and Sexual Activity   Alcohol use: Yes    Comment: rarely, less than once a month   Drug use: No   Sexual activity: Not Currently    Partners: Male    Birth control/protection: Surgical    Comment: menarche age 33, P1 @ age 51,  hysterectomy  Other Topics Concern   Not on file  Social History Narrative   Not on file   Social Determinants of Health   Financial Resource Strain: Not on file  Food Insecurity: Not on file  Transportation Needs: Not on file  Physical Activity: Not on file  Stress: Not on file  Social Connections: Not on file    Current Medications:  Current Facility-Administered Medications:    ceFAZolin (ANCEF) IVPB 2g/100 mL premix, 2 g, Intravenous, On Call to OR, Cross, Melissa D, NP   dexamethasone (DECADRON) injection 4 mg, 4 mg, Intravenous, On Call to OR, [name] D, NP   lactated ringers infusion, , Intravenous, Continuous,  Oleta Mouse, MD, Last Rate: 10 mL/hr at 10/31/21 0950, New Bag at 10/31/21 0950   scopolamine (TRANSDERM-SCOP) 1 MG/3DAYS 1.5 mg, 1 patch, Transdermal, On Call to OR, Cross, Melissa D, NP, 1.5 mg at 10/31/21 0940  Review of Systems: Denies appetite changes, fevers, chills, fatigue, unexplained weight changes. Denies hearing loss, neck lumps or masses, mouth sores, ringing in ears or voice changes. Denies cough or wheezing.  Denies shortness of breath. Denies chest pain or palpitations. Denies leg swelling. Denies abdominal distention, pain, blood in stools, constipation, diarrhea, nausea, vomiting, or early satiety. Denies pain with intercourse, dysuria, frequency, hematuria or incontinence. Denies hot flashes, pelvic pain, vaginal bleeding or vaginal discharge.  Denies joint pain, back pain or muscle pain/cramps. Denies itching, rash, or wounds. Denies dizziness, headaches, numbness or seizures. Denies swollen lymph nodes or glands, denies easy bruising or bleeding. Denies anxiety, depression, confusion, or decreased concentration.  Physical Exam: BP 139/84   Pulse 66   Temp 98.4 F (36.9 C) (Oral)   Resp 16   Ht '5\' 6"'$  (1.676 m)   Wt 240 lb 1.3 oz (108.9 kg)   LMP 12/28/2012 Comment: salpingo oophorectomy  SpO2 98%   BMI 38.75 kg/m  General: Alert, oriented, no acute distress.  HEENT: Normocephalic, atraumatic. Sclera anicteric.  Chest: Clear to auscultation bilaterally. No wheezes, rhonchi, or rales. Cardiovascular: Regular rate and rhythm, no murmurs, rubs, or gallops.  Abdomen: Obese. Normoactive bowel sounds. Soft, nondistended, nontender to palpation. No masses or hepatosplenomegaly appreciated. No palpable fluid wave.  Well-healing incisions although some mild erythema around the umbilicus consistent with Candida and this incision is superficially open. Extremities: Grossly normal range of motion. Warm, well perfused. No edema bilaterally.  Skin: No rashes or  lesions  Laboratory & Radiologic Studies: CBC    Component Value Date/Time   WBC 10.0 10/23/2021 1451   RBC 4.66 10/23/2021 1451   HGB 14.9 10/23/2021 1451   HGB 14.1 01/07/2017 1212   HCT 43.2 10/23/2021 1451   HCT 41.1 01/07/2017 1212   PLT 265 10/23/2021 1451   PLT 223 01/07/2017 1212   MCV 92.7 10/23/2021 1451   MCV 91.6 01/07/2017 1212   MCH 32.0 10/23/2021 1451   MCHC 34.5 10/23/2021 1451   RDW 13.1 10/23/2021 1451   RDW 13.1 01/07/2017 1212   LYMPHSABS 2.7 03/23/2021 1131   LYMPHSABS 3.0 01/07/2017 1212   MONOABS 0.5 03/23/2021 1131   MONOABS 0.5 01/07/2017 1212   EOSABS 0.2 03/23/2021 1131   EOSABS 0.1 01/07/2017 1212   BASOSABS 0.0 03/23/2021 1131   BASOSABS 0.1 01/07/2017 1212      Latest Ref Rng & Units 10/23/2021    2:51 PM 09/08/2021   10:02 AM 03/23/2021   11:31 AM  BMP  Glucose 70 - 99 mg/dL 123  210  334   BUN 6 - 20 mg/dL '12  13  15   '$ Creatinine 0.44 - 1.00 mg/dL 0.73  0.77  0.75   Sodium 135 - 145 mmol/L 137  138  135   Potassium 3.5 - 5.1 mmol/L 3.6  3.8  4.1   Chloride 98 - 111 mmol/L 105  103  101   CO2 22 - 32 mmol/L '24  27  28   '$ Calcium 8.9 - 10.3 mg/dL 9.6  9.5  9.4    Assessment & Plan: Shontelle L. Hannibal is a 46 y.o. woman with history of breast cancer and cervical dysplasia with stage Ib1 grade 1 SCC diagnosed after recent hysterectomy.   Presents today for robotic pelvic LND, possible radical parametrectomy and upper vaginectomy, possible vulvar biopsy versus excision.  Jeral Pinch, MD  Division of Gynecologic Oncology  Department of Obstetrics and Gynecology  [facility]

## 2021-10-31 NOTE — Anesthesia Preprocedure Evaluation (Addendum)
Anesthesia Evaluation  Patient identified by MRN, date of birth, ID band Patient awake    Reviewed: Allergy & Precautions, NPO status , Patient's Chart, lab work & pertinent test results  History of Anesthesia Complications Negative for: history of anesthetic complications  Airway Mallampati: III  TM Distance: >3 FB Neck ROM: Full    Dental  (+) Dental Advisory Given, Chipped, Missing,    Pulmonary Current Smoker,    Pulmonary exam normal breath sounds clear to auscultation       Cardiovascular hypertension, Pt. on medications + DVT (2014)  Normal cardiovascular exam Rhythm:Regular Rate:Normal     Neuro/Psych Anxiety negative neurological ROS     GI/Hepatic negative GI ROS, Neg liver ROS,   Endo/Other  diabetes, Type 2, Oral Hypoglycemic AgentsBMI 39  Renal/GU negative Renal ROS  negative genitourinary   Musculoskeletal negative musculoskeletal ROS (+)   Abdominal (+) + obese,   Peds  Hematology negative hematology ROS (+)   Anesthesia Other Findings Hx of left breast ca s/p chemo/radiation  Reproductive/Obstetrics negative OB ROS                           Anesthesia Physical  Anesthesia Plan  ASA: 3  Anesthesia Plan: General   Post-op Pain Management: Tylenol PO (pre-op)*, Celebrex PO (pre-op)* and Gabapentin PO (pre-op)*   Induction: Intravenous  PONV Risk Score and Plan: 3 and Treatment may vary due to age or medical condition, Scopolamine patch - Pre-op, Midazolam, Dexamethasone and Ondansetron  Airway Management Planned: Oral ETT and Video Laryngoscope Planned  Additional Equipment: None  Intra-op Plan:   Post-operative Plan: Extubation in OR  Informed Consent: I have reviewed the patients History and Physical, chart, labs and discussed the procedure including the risks, benefits and alternatives for the proposed anesthesia with the patient or authorized representative  who has indicated his/her understanding and acceptance.     Dental advisory given  Plan Discussed with: CRNA  Anesthesia Plan Comments:        Anesthesia Quick Evaluation

## 2021-10-31 NOTE — Brief Op Note (Signed)
10/31/2021  3:03 PM  PATIENT:  [name]  46 y.o. female  PRE-OPERATIVE DIAGNOSIS:  cervical cancer  POST-OPERATIVE DIAGNOSIS:  cervical cancer  PROCEDURE:  Procedure(s): XI ROBOTIC ASSISTED PELVIC LYMPH NODE DISSECTION, radical parametrectomy, upper vaginectomy, vulvar biopsy (N/A) CYSTOSCOPY (N/A)  SURGEON:  Surgeon(s) and Role:    Lafonda Mosses, MD - Primary  ASSISTANTS: Delsa Sale, lisa MA   ANESTHESIA:   general  EBL:  100 mL   BLOOD ADMINISTERED:none  DRAINS: none   LOCAL MEDICATIONS USED:  MARCAINE     SPECIMEN:  bilateral pelvic lymph nodes, left and right parametrium with vaginal apex, anterior vagina, posterior vagina  DISPOSITION OF SPECIMEN:  PATHOLOGY  COUNTS:  YES  TOURNIQUET:  * No tourniquets in log *  DICTATION: .Note written in EPIC  PLAN OF CARE: Discharge to home after PACU  PATIENT DISPOSITION:  PACU - hemodynamically stable.   Delay start of Pharmacological VTE agent (>24hrs) due to surgical blood loss or risk of bleeding: no

## 2021-10-31 NOTE — Progress Notes (Signed)
Verbal order by MD Delsa Sale for Benadryl capsule '25mg'$  PO q6hrs ordered.

## 2021-10-31 NOTE — Anesthesia Postprocedure Evaluation (Signed)
Anesthesia Post Note  Patient: [name]  Procedure(s) Performed: XI ROBOTIC ASSISTED BILATERAL PELVIC LYMPH NODE DISSECTION, radical parametrectomy, upper vaginectomy, vulvar biopsy (Abdomen) CYSTOSCOPY (Vagina )     Patient location during evaluation: PACU Anesthesia Type: General Level of consciousness: sedated and patient cooperative Pain management: pain level controlled Vital Signs Assessment: post-procedure vital signs reviewed and stable Respiratory status: spontaneous breathing Cardiovascular status: stable Anesthetic complications: no   No notable events documented.  Last Vitals:  Vitals:   10/31/21 1630 10/31/21 1645  BP: (!) 150/86 (!) 141/72  Pulse: 85 71  Resp: 16 10  Temp:    SpO2: 98% 99%    Last Pain:  Vitals:   10/31/21 1614  TempSrc:   PainSc: [place]

## 2021-11-01 ENCOUNTER — Encounter (HOSPITAL_COMMUNITY): Payer: Self-pay | Admitting: Gynecologic Oncology

## 2021-11-01 DIAGNOSIS — C539 Malignant neoplasm of cervix uteri, unspecified: Secondary | ICD-10-CM | POA: Diagnosis not present

## 2021-11-01 LAB — CBC
HCT: 40.5 % (ref 36.0–46.0)
Hemoglobin: 14.1 g/dL (ref 12.0–15.0)
MCH: 32.4 pg (ref 26.0–34.0)
MCHC: 34.8 g/dL (ref 30.0–36.0)
MCV: 93.1 fL (ref 80.0–100.0)
Platelets: 237 10*3/uL (ref 150–400)
RBC: 4.35 MIL/uL (ref 3.87–5.11)
RDW: 13.2 % (ref 11.5–15.5)
WBC: 15.8 10*3/uL — ABNORMAL HIGH (ref 4.0–10.5)
nRBC: 0 % (ref 0.0–0.2)

## 2021-11-01 LAB — BASIC METABOLIC PANEL
Anion gap: 8 (ref 5–15)
BUN: 13 mg/dL (ref 6–20)
CO2: 21 mmol/L — ABNORMAL LOW (ref 22–32)
Calcium: 8.8 mg/dL — ABNORMAL LOW (ref 8.9–10.3)
Chloride: 105 mmol/L (ref 98–111)
Creatinine, Ser: 0.82 mg/dL (ref 0.44–1.00)
GFR, Estimated: >60 mL/min (ref >60)
Glucose, Bld: 186 mg/dL — ABNORMAL HIGH (ref 70–99)
Potassium: 4.6 mmol/L (ref 3.5–5.1)
Sodium: 134 mmol/L — ABNORMAL LOW (ref 135–145)

## 2021-11-01 LAB — GLUCOSE, CAPILLARY: Glucose-Capillary: 177 mg/dL — ABNORMAL HIGH (ref 70–99)

## 2021-11-01 MED ORDER — ENOXAPARIN (LOVENOX) PATIENT EDUCATION KIT
PACK | Freq: Once | Status: DC
  Filled 2021-11-01: qty 1

## 2021-11-01 NOTE — Progress Notes (Signed)
Discharge instructions discussed with patient and family, including foley care and Lovenox injections, verbalized agreement and understanding

## 2021-11-01 NOTE — Discharge Summary (Signed)
Physician Discharge Summary  Patient ID: [name] MRN: [id number] DOB/AGE: 46-04-1975 46 y.o.  Admit date: 10/31/2021 Discharge date: 11/01/2021  Admission Diagnoses: Cervical cancer [facility])  Discharge Diagnoses:  Principal Problem:   Cervical cancer [facility])   Discharged Condition:  The patient is in good condition and stable for discharge.    Hospital Course: On 10/31/2021, the patient underwent the following: Procedure(s): XI ROBOTIC ASSISTED BILATERAL PELVIC LYMPH NODE DISSECTION, radical parametrectomy, upper vaginectomy, vulvar biopsy CYSTOSCOPY. The postoperative course was uneventful.  She was discharged to home on postoperative day 1 tolerating a regular diet, foley in place, pain controlled.   Consults: None  Significant Diagnostic Studies: Am labs  Treatments: surgery: see above  Discharge Exam (see progress note from today): Blood pressure (!) 142/77, pulse 77, temperature 98.4 F (36.9 C), temperature source Oral, resp. rate 18, height '5\' 6"'$  (1.676 m), weight 240 lb 1.3 oz (108.9 kg), last menstrual period 12/28/2012, SpO2 97 %.  Disposition: Discharge disposition: 01-Home or Self Care       Discharge Instructions     Call MD for:  difficulty breathing, headache or visual disturbances   Complete by: As directed    Call MD for:  extreme fatigue   Complete by: As directed    Call MD for:  hives   Complete by: As directed    Call MD for:  persistant dizziness or light-headedness   Complete by: As directed    Call MD for:  persistant nausea and vomiting   Complete by: As directed    Call MD for:  redness, tenderness, or signs of infection (pain, swelling, redness, odor or green/yellow discharge around incision site)   Complete by: As directed    Call MD for:  severe uncontrolled pain   Complete by: As directed    Call MD for:  temperature >100.4   Complete by: As directed    Diet - low sodium heart healthy   Complete by: As directed    Driving  Restrictions   Complete by: As directed    No driving for 1 week(s).  Do not take narcotics and drive. You need to make sure your reaction time has returned.   Increase activity slowly   Complete by: As directed    Lifting restrictions   Complete by: As directed    No lifting greater than 10 lbs, pushing, pulling, straining for 6 weeks.   Sexual Activity Restrictions   Complete by: As directed    No sexual activity, nothing in the vagina, for 8 weeks.      Allergies as of 11/01/2021       Reactions   Influenza Virus Vaccine Swelling   Amoxicillin Other (See Comments)   States "it does not work"        Medication List     STOP taking these medications    desmopressin 0.2 MG tablet Commonly known as: DDAVP       TAKE these medications    b complex vitamins tablet Take 1 tablet by mouth every morning.   BIOTIN PO Take 1 tablet by mouth every morning.   Cholecalciferol 50 MCG (2000 UT) Caps Take 2,000 Units by mouth daily.   enoxaparin 40 MG/0.4ML injection Commonly known as: LOVENOX Inject 0.4 mLs (40 mg total) into the skin daily for 14 days. To start the day AFTER surgery   ibuprofen 800 MG tablet Commonly known as: ADVIL Take 1 tablet (800 mg total) by mouth every 8 (eight) hours as  needed for moderate pain. For AFTER surgery only   ICAPS AREDS 2 PO Take 1 capsule by mouth daily.   lisinopril 10 MG tablet Commonly known as: ZESTRIL TAKE 1 TABLET BY MOUTH EVERY DAY IN THE MORNING   metformin 500 MG (OSM) 24 hr tablet Commonly known as: FORTAMET Take 1 tablet (500 mg total) by mouth daily with breakfast.   oxyCODONE 5 MG immediate release tablet Commonly known as: Oxy IR/ROXICODONE Take 1 tablet (5 mg total) by mouth every 4 (four) hours as needed for severe pain. For AFTER surgery, do not take and drive   senna-docusate 8.6-50 MG tablet Commonly known as: Senokot-S Take 2 tablets by mouth at bedtime. For AFTER surgery, do not take if having  diarrhea        Follow-up Information     Theresa Mosses, MD Follow up on 11/07/2021.   Specialty: Gynecologic Oncology Why: at 4:20pm will be a PHONE visit with Dr. Berline Cox to check in and discuss pathology. IN PERSON visit will be on 11/23/2021 at 2:30pm at the [facility]. Contact information: [address] [place] [address] [phone]                 Greater than thirty minutes were spend for face to face discharge instructions and discharge orders/summary in EPIC.   Signed: [name] 11/01/2021, 10:35 AM

## 2021-11-01 NOTE — Progress Notes (Signed)
1 Day Post-Op Procedure(s) (LRB): XI ROBOTIC ASSISTED BILATERAL PELVIC LYMPH NODE DISSECTION, radical parametrectomy, upper vaginectomy, vulvar biopsy (N/A) CYSTOSCOPY (N/A)  Subjective: Patient reports doing well.  Having some abdominal pain, controlled on current regimen.  Was able to get some sleep last night.  Has ambulated without dizziness.  Denies flatus.  Tolerated small amount of p.o. last night without nausea or emesis.  Objective: Vital signs in last 24 hours: Temp:  [97.8 F (36.6 C)-98.7 F (37.1 C)] 97.8 F (36.6 C) (08/16 0520) Pulse Rate:  [66-85] 73 (08/16 0520) Resp:  [10-21] 18 (08/16 0520) BP: (129-168)/(72-103) 148/83 (08/16 0520) SpO2:  [92 %-99 %] 95 % (08/16 0520) Weight:  [240 lb 1.3 oz (108.9 kg)] 240 lb 1.3 oz (108.9 kg) (08/15 0919) Last BM Date : 10/30/21  Intake/Output from previous day: 08/15 0701 - 08/16 0700 In: 3564.2 [P.O.:840; I.V.:2724.2] Out: 3350 [Urine:3250; Blood:100]  Physical Examination: General: No acute distress, alert and oriented HEENT: Normocephalic, atraumatic Pulmonary: Lungs clear to auscultation bilaterally, no wheezes or rhonchi Cardiovascular: Regular rate and rhythm, no murmurs, rubs, or gallops Abdomen: Soft, nondistended, appropriately tender, incisions are clean, dry, intact with Dermabond in place, mildly hypoactive bowel sounds Extremities: No edema, warm and well-perfused, SCDs in place  Labs:    Latest Ref Rng & Units 11/01/2021    5:32 AM 10/23/2021    2:51 PM 09/12/2021    2:06 PM  CBC  WBC 4.0 - 10.5 K/uL 15.8  10.0  17.9   Hemoglobin 12.0 - 15.0 g/dL 14.1  14.9  14.0   Hematocrit 36.0 - 46.0 % 40.5  43.2  41.0   Platelets 150 - 400 K/uL 237  265  227       Latest Ref Rng & Units 11/01/2021    5:32 AM 10/23/2021    2:51 PM 09/08/2021   10:02 AM  BMP  Glucose 70 - 99 mg/dL 186  123  210   BUN 6 - 20 mg/dL '13  12  13   '$ Creatinine 0.44 - 1.00 mg/dL 0.82  0.73  0.77   Sodium 135 - 145 mmol/L 134  137  138    Potassium 3.5 - 5.1 mmol/L 4.6  3.6  3.8   Chloride 98 - 111 mmol/L 105  105  103   CO2 22 - 32 mmol/L '21  24  27   '$ Calcium 8.9 - 10.3 mg/dL 8.8  9.6  9.5    Assessment:  46 y.o. s/p Procedure(s): XI ROBOTIC ASSISTED BILATERAL PELVIC LYMPH NODE DISSECTION, radical parametrectomy, upper vaginectomy, vulvar biopsy CYSTOSCOPY: Meeting most postoperative milestones  Postop: Meeting most milestones.  Will advance diet this morning.  Foley catheter in place for 1 week given radical surgery.  We will do Foley teaching prior to discharge and plan for 1 week follow-up in clinic for Foley catheter removal.  Diabetes: Hyperglycemia in the setting of recent surgery as well as Decadron yesterday.  Patient will restart her metformin when she goes home.  Leukocytosis: In the setting of recent surgery, reactive.  No signs or symptoms of infection.  Cervical cancer: Discussed findings at the time of surgery, lymph nodes that were sent for frozen section.  All questions answered.  Patient aware that I will call her when final pathology has resulted.  Prophylaxis: Given patient's history of DVT as well as cancer diagnosis, plan for 2 weeks of prophylactic Lovenox.  We will do teaching again today prior to discharge.  Patient has Lovenox prescription already at  home.  Plan: DC IV fluids, advance diet, Foley and Lovenox teaching The patient is to be discharged to home, later today.   LOS: 0 days    Lafonda Mosses 11/01/2021, 6:43 AM

## 2021-11-01 NOTE — Progress Notes (Signed)
  Transition of Care (TOC) Screening Note   Patient Details  Name: [name] Date of Birth: [birth year]   Transition of Care [facility]) CM/SW Contact:    Lennart Pall, LCSW Phone Number: 11/01/2021, 10:10 AM    Transition of Care Department [facility]) has reviewed patient and no TOC needs have been identified at this time. We will continue to monitor patient advancement through interdisciplinary progression rounds. If new patient transition needs arise, please place a TOC consult.

## 2021-11-02 ENCOUNTER — Telehealth: Payer: Self-pay

## 2021-11-02 LAB — SURGICAL PATHOLOGY

## 2021-11-02 NOTE — Telephone Encounter (Signed)
Spoke with Ms. Malacara this morning. She states she is eating, drinking and urinating well.Pt still has catheter.  She has had a BM and is passing gas. She is taking senokot as prescribed and encouraged her to drink plenty of water. She denies fever or chills. Incisions are dry and intact. She rates her pain 4/10. Her pain is controlled with pt states  she used Oxycodone last night so she could sleep and again this morning at 6:00am. Her last Ibuprofen was 10:00am.  Pt states she has had right hand numbness/tingling where IV was placed. No redness or swelling. [name] NP aware and advised could be where the pt had her hands placed during surgery. Pt aware to watch and call if there is any swelling/redness/pain. She voiced an understanding.   Instructed to call office with any fever, chills, purulent drainage, uncontrolled pain or any other questions or concerns. Patient verbalizes understanding.   Pt aware of post op appointments as well as the office number [phone] and after hours number [phone] to call if she has any questions or concerns

## 2021-11-03 ENCOUNTER — Telehealth: Payer: Self-pay

## 2021-11-03 NOTE — Telephone Encounter (Signed)
Told [name] the results noted in [place] from Dr. Berline Lopes regarding pathology results as noted below. Great news! Final pathology is back and all of the tissue (including lymph nodes) that I removed was negative for any cancer. We will discuss more during your phone visit. Pt verbalized understanding.

## 2021-11-06 ENCOUNTER — Inpatient Hospital Stay: Payer: BC Managed Care – PPO

## 2021-11-06 ENCOUNTER — Other Ambulatory Visit: Payer: Self-pay

## 2021-11-06 ENCOUNTER — Other Ambulatory Visit: Payer: Self-pay | Admitting: *Deleted

## 2021-11-06 ENCOUNTER — Telehealth: Payer: Self-pay

## 2021-11-06 ENCOUNTER — Inpatient Hospital Stay (HOSPITAL_BASED_OUTPATIENT_CLINIC_OR_DEPARTMENT_OTHER): Payer: BC Managed Care – PPO | Admitting: Gynecologic Oncology

## 2021-11-06 VITALS — BP 147/98 | HR 78 | Temp 98.0°F | Resp 17 | Wt 235.2 lb

## 2021-11-06 DIAGNOSIS — R3989 Other symptoms and signs involving the genitourinary system: Secondary | ICD-10-CM

## 2021-11-06 DIAGNOSIS — Z978 Presence of other specified devices: Secondary | ICD-10-CM

## 2021-11-06 DIAGNOSIS — N390 Urinary tract infection, site not specified: Secondary | ICD-10-CM | POA: Diagnosis not present

## 2021-11-06 DIAGNOSIS — C539 Malignant neoplasm of cervix uteri, unspecified: Secondary | ICD-10-CM | POA: Diagnosis present

## 2021-11-06 MED ORDER — PHENAZOPYRIDINE HCL 95 MG PO TABS: 95.0000 mg | ORAL_TABLET | Freq: Three times a day (TID) | ORAL | 0 refills | Status: DC | PRN

## 2021-11-06 NOTE — Patient Instructions (Signed)
Plan to attempt to urinate at least every 2-3 hours during the day.   We will send the urine sample for culture today to test for an infection.  If you notice any new symptoms such as lower abdominal/pelvic swelling, pressure, severe pain, this may be a sign you are retaining urine and please call the office as soon as possible.

## 2021-11-06 NOTE — Progress Notes (Signed)
The proposed treatment discussed in conference is for discussion purpose only and is not a binding recommendation.  The patients have not been physically examined, or presented with their treatment options.  Therefore, final treatment plans cannot be decided.  

## 2021-11-06 NOTE — Progress Notes (Signed)
Gynecologic Oncology Symptom Management  [name] is a 46 year old female s/p robotic-assisted bilateral pelvic lymphadenectomy, bilateral ureterolysis, radical parametrectomy and upper vaginectomy, vulvar biopsy with Dr. 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 She presents to the office today with severe lower abdominal pain/bladder spasms, urine leaking around the catheter. Symptoms began on Saturday and have worsened. The catheter has been draining concentrated urine. She has had to use pain medication due to severe pain. Minimal amount of vaginal bleeding noted immediately post-op and none since. No blood reports in urine in catheter. Bowels functioning without difficulty. No fever, chills, lower back discomfort. Denies lower extremity edema bilaterally.  Exam: Alert, oriented, in no acute distress but appears uncomfortable. Foley catheter in place with concentrated urine present. No obvious abnormality noted with the urethra externally. Abdomen soft, incisions healing without evidence of infection. Incision above the umbilicus slightly separated superficially without erythema or significant drainage. Benzoin applied to sides of incision and steri strip placed. No lower extrem edema noted.  Urine sample obtained from foley with plans for urine culture. Patient only able to tolerate 100 cc of instillation of sterile normal saline through the foley with strong sensation to urinate. She was able to void 100 cc. Reported burning at the end of the stream with scant amount of blood.  Assessment/Plan: Urine sent for urine culture. Patient reporting improvement in symptoms after removal. Reportable signs and symptoms reviewed. She has a phone visit tomorrow afternoon with Dr. Berline Lopes. Pyridium sent in for symptom relief. She is advised to call for any needs, questions, or any new symptoms.

## 2021-11-06 NOTE — Telephone Encounter (Signed)
Theresa [name] states that beginning Saturday that she was feeling that she needed to urinate and would and would try and the urine leaked around the catheter.  She is having a lot of abdominal pain. The pain is intermittent. She used her pain medication 1-2 times a day with good effect. Reviewed with Theresa John, NP. [name] will come in today at 1330 to have foley catheter to be removed. Appointment for catheter removal for 11-07-21 cancelled.

## 2021-11-07 ENCOUNTER — Ambulatory Visit: Payer: BC Managed Care – PPO | Admitting: Gynecologic Oncology

## 2021-11-07 ENCOUNTER — Other Ambulatory Visit: Payer: Self-pay | Admitting: Gynecologic Oncology

## 2021-11-07 ENCOUNTER — Inpatient Hospital Stay (HOSPITAL_BASED_OUTPATIENT_CLINIC_OR_DEPARTMENT_OTHER): Payer: BC Managed Care – PPO | Admitting: Gynecologic Oncology

## 2021-11-07 ENCOUNTER — Encounter: Payer: Self-pay | Admitting: Gynecologic Oncology

## 2021-11-07 DIAGNOSIS — N390 Urinary tract infection, site not specified: Secondary | ICD-10-CM

## 2021-11-07 DIAGNOSIS — Z9889 Other specified postprocedural states: Secondary | ICD-10-CM

## 2021-11-07 DIAGNOSIS — Z7189 Other specified counseling: Secondary | ICD-10-CM

## 2021-11-07 DIAGNOSIS — Z90710 Acquired absence of both cervix and uterus: Secondary | ICD-10-CM

## 2021-11-07 DIAGNOSIS — C539 Malignant neoplasm of cervix uteri, unspecified: Secondary | ICD-10-CM

## 2021-11-07 MED ORDER — NITROFURANTOIN MONOHYD MACRO 100 MG PO CAPS: 100.0000 mg | ORAL_CAPSULE | Freq: Two times a day (BID) | ORAL | 0 refills | Status: DC

## 2021-11-07 NOTE — Progress Notes (Signed)
Gynecologic Oncology Telehealth Note: Gyn-Onc  I connected with [name] on 11/07/21 at  4:20 PM EDT by telephone and verified that I am speaking with the correct person using two identifiers.  I discussed the limitations, risks, security and privacy concerns of performing an evaluation and management service by telemedicine and the availability of in-person appointments. I also discussed with the patient that there may be a patient responsible charge related to this service. The patient expressed understanding and agreed to proceed.  Other persons participating in the visit and their role in the encounter: none.  Patient's location: home Provider's location: [facility]  Reason for Visit: follow-up after surgery, treatment discussion  Treatment History: 10/2013: Pap - normal, HR HPV+.   12/2013: Cervical biopsy and ECC - LSIL.  01/2014: BSO. LEEP - no dysplasia or malignancy, koilocytic changes.  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 09/12/21: TRH for CIN3, fibroids. Pathology revealed grade 1 SCC of the cervix arising in CIN3, tumor size 1.5cm, DOI 4m, 0.571mfrom radial margin. LVI negative.   10/05/21: PET with no evidence of metastatic disease or residual tumor.  10/31/21: Robotic bilateral pelvic LND, radical parametrectomy and upper vaginectomy.   Interval History: Doing well. Reports some soreness, thinks more related to bladder. Voiding well since catheter out. Reports normal bowel function. Denies vaginal bleeding after spotting first 1-2 days.  Past Medical/Surgical History: Past Medical History:  Diagnosis Date   Abnormal Pap smear of cervix    06-09-21 HGSIL HPV HR +, 16+, 18/45 neg   Anxiety    [facility] [place]   left, lower medial, 8 o'clock   Cervical cancer (HCC)    Diabetes mellitus without complication (HCC)    Type 2, Pt takes Metformin.   [place]  [place]deep [place]   RT LEG , during breast cancer treatment   Family history of anesthesia complication    pt's mother has hx. of post-op N/V   History of kidney stones    Incidental finding on PET Scan   History of radiation therapy 07/06/13-08/20/13   left breast/   History of transfusion    x2 during chemotherapy   Hypertension    under control with med., has been on med. since 09/2012   Pain in the wrist    BILATERAL   Peripheral neuropathy    numbness in toes due to chemotherapy   Personal history of chemotherapy 2014   Red devil & 5FU, 8 sessions and Taxol 12 sessions   Personal history of radiation therapy 2014   07/06/13 - 08/20/13   Status post chemotherapy    Patient completed 7 years of Letrozole in 08/2020.   Urinary incontinence in female    follows with Urology    Past Surgical History:  Procedure Laterality Date   ABDOMINAL HYSTERECTOMY  09/12/2021   BREAST BIOPSY Right 11/2017   benign   BREAST LUMPECTOMY Left 2014   BREAST LUMPECTOMY WITH NEEDLE LOCALIZATION AND AXILLARY SENTINEL LYMPH NODE BX Left 12/10/2012   Procedure: BREAST LUMPECTOMY WITH NEEDLE LOCALIZATION AND AXILLARY SENTINEL LYMPH NODE BX;  Surgeon: PaMerrie RoofMD;  [facility];  Laterality: Left;  Needle loc BCG 7:30  nuc med 9:30     [facility]   CYSTOSCOPY N/A 10/31/2021  Procedure: CYSTOSCOPY;  Surgeon: Lafonda Mosses, MD;  Location: [facility];  Service: Gynecology;  Laterality: N/A;   LEEP N/A 01/26/2014   Procedure: LOOP ELECTROSURGICAL EXCISION PROCEDURE (LEEP);  Surgeon: Lucita Lora. Alycia Rossetti, MD;  Location: [facility];  Service: Gynecology;  Laterality: N/A;   OPEN REDUCTION INTERNAL FIXATION (ORIF) TIBIA/FIBULA FRACTURE Right 2013   PORT-A-CATH REMOVAL  2015   PORTACATH PLACEMENT Right 01/09/2013   Procedure: INSERTION PORT-A-CATH;  Surgeon: Merrie Roof, MD;  Location: [place];  Service: General;  Laterality: Right;   ROBOTIC ASSISTED BILATERAL SALPINGO OOPHERECTOMY Bilateral 01/26/2014   Procedure: ROBOTIC ASSISTED BILATERAL SALPINGO OOPHORECTOMY;  Surgeon: Lucita Lora. Alycia Rossetti, MD;  Location: [facility];  Service: Gynecology;  Laterality: Bilateral;   ROBOTIC ASSISTED LAPAROSCOPIC HYSTERECTOMY AND SALPINGECTOMY Bilateral 09/12/2021   Procedure: XI ROBOTIC ASSISTED LAPAROSCOPIC HYSTERECTOMY;  Surgeon: Princess Bruins, MD;  Location: [place];  Service: Gynecology;  Laterality: Bilateral;   TONSILLECTOMY AND ADENOIDECTOMY     as a teenager    Family History  Problem Relation Age of Onset   Anesthesia problems Mother        post-op N/V   Breast cancer Maternal Grandmother        50-60s   Lung cancer Paternal Grandmother    Colon cancer Neg Hx    Ovarian cancer Neg Hx    Endometrial cancer Neg Hx    Pancreatic cancer Neg Hx    Prostate cancer Neg Hx     Social History   Socioeconomic History   Marital status: Divorced    Spouse name: Not on file   Number of children: Not on file   Years of education: Not on file   Highest education level: Not on file  Occupational History   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  Vaping Use   Vaping Use: Never used  Substance and Sexual Activity   Alcohol use: Yes    Comment: rarely, less than once a month   Drug use: No   Sexual activity: Not Currently    Partners: Male    Birth control/protection: Surgical    Comment: menarche age 66, P56 @ age 63,  hysterectomy  Other Topics Concern   Not on file  Social History Narrative   Not on file   Social Determinants of Health   Financial  Resource Strain: Not on file  Food Insecurity: Not on file  Transportation Needs: Not on file  Physical Activity: Not on file  Stress: Not on file  Social Connections: Not on file    Current Medications:  Current Outpatient Medications:    b complex vitamins tablet, Take 1 tablet by mouth every morning., Disp: , Rfl:    BIOTIN PO, Take 1 tablet by mouth every morning., Disp: , Rfl:    Cholecalciferol 2000 units CAPS, Take 2,000 Units by mouth daily., Disp: , Rfl:    enoxaparin (LOVENOX) 40 MG/0.4ML injection, Inject 0.4 mLs (40 mg total) into the skin daily for 14 days. To start the day AFTER surgery, Disp: 5.6 mL, Rfl: 0   ibuprofen (ADVIL) 800 MG tablet, Take 1 tablet (800  mg total) by mouth every 8 (eight) hours as needed for moderate pain. For AFTER surgery only, Disp: 30 tablet, Rfl: 0   lisinopril (ZESTRIL) 10 MG tablet, TAKE 1 TABLET BY MOUTH EVERY DAY IN THE MORNING, Disp: 90 tablet, Rfl: 0   metformin (FORTAMET) 500 MG (OSM) 24 hr tablet, Take 1 tablet (500 mg total) by mouth daily with breakfast., Disp: 90 tablet, Rfl: 1   Multiple Vitamins-Minerals (ICAPS AREDS 2 PO), Take 1 capsule by mouth daily., Disp: , Rfl:    oxyCODONE (OXY IR/ROXICODONE) 5 MG immediate release tablet, Take 1 tablet (5 mg total) by mouth every 4 (four) hours as needed for severe pain. For AFTER surgery, do not take and drive, Disp: 15 tablet, Rfl: 0   phenazopyridine (PYRIDIUM) 95 MG tablet, Take 1 tablet (95 mg total) by mouth 3 (three) times daily as needed for pain (bladder pain, dysuria)., Disp: 10 tablet, Rfl: 0   senna-docusate (SENOKOT-S) 8.6-50 MG tablet, Take 2 tablets by mouth at bedtime. For AFTER surgery, do not take if having diarrhea, Disp: 30 tablet, Rfl: 0  Review of Symptoms: Pertinent positives as per HPI.  Physical Exam: Deferred given limitations of phone visit.  Laboratory & Radiologic Studies: A. LYMPH NODE, LEFT EXTERNAL ILIAC, EXCISION:  Hyperplastic lymph node with stromal  hyalinization and  microcalcification.  Negative for metastatic carcinoma and lymphoproliferative disorder.   B. PERINEAL, RIGHT AT 0700, BIOPSY:  Benign epithelial inclusion in the dermis.  Negative for dysplasia and malignancy   C. LYMPH NODES, BILATERAL PELVIC, RESECTION:  Eight (8) lymph nodes some with stromal hyalinization and  microcalcification and some with fatty infiltration.  All are negative for metastatic carcinoma.   D. PERIMETRIUM, RIGHT, AND UPPER VAGINA, RESECTION:  Portions of fibroconnective tissue and vasculature with organized  thrombi in some blood vessels.  Carcinoma is not identified in any of the sections examined.   E. VAGINAL MARGIN, ANTERIOR, EXCISION:  Portions of fibroconnective tissue and granulation tissue with  organizing thrombi and some blood vessels.  Normal squamous epithelium is not identified.  Carcinoma is not identified in the specimen.   F. VAGINA PARAMETRIUM, LEFT, EXCISION:  Portions of fibroconnective tissue and granulation tissue with  organizing thrombi in some blood vessels.  Normal squamous epithelium is not identified.  Carcinoma is not identified in any of the sections examined.   G. VAGINA, POSTERIOR, EXCISION:  Normal vaginal mucosa and adjacent granulation tissue.  Carcinoma is not identified in the specimen.  Assessment & Plan: [name] is a 46 y.o. woman with Stage IB1 SCC of the cervix (no LVI, negative Lns) who presents for phone discussion after recent surgery.  Meeting post-op milestones. Catheter removed yesterday, doing much better in terms of bladder pain/spasms. Evidence of UTI - antibiotic sent in already.  Discussed pathology from surgery. Confirms tumor resected at time of hysterectomy and lymph nodes all negative. Given this (does not meet [place] or Ferdinand Lango criteria), no adjuvant therapy indicated.   I discussed the assessment and treatment plan with the patient. The patient was provided with an  opportunity to ask questions and all were answered. The patient agreed with the plan and demonstrated an understanding of the instructions.   The patient was advised to call back or see an in-person evaluation if the symptoms worsen or if the condition fails to improve as anticipated.   12 minutes of total time was spent for this patient encounter, including preparation, phone counseling with the patient and coordination of care,  and documentation of the encounter.   Jeral Pinch, MD  Division of Gynecologic Oncology  Department of Obstetrics and Gynecology  [facility]

## 2021-11-07 NOTE — Progress Notes (Signed)
See Dr. Berline Lopes phone note. Plan to begin macrobid. Will continue to follow sensitivities.

## 2021-11-09 ENCOUNTER — Other Ambulatory Visit: Payer: Self-pay | Admitting: Gynecologic Oncology

## 2021-11-09 ENCOUNTER — Telehealth: Payer: Self-pay

## 2021-11-09 DIAGNOSIS — N390 Urinary tract infection, site not specified: Secondary | ICD-10-CM

## 2021-11-09 LAB — URINE CULTURE: Culture: >=100000 — AB

## 2021-11-09 MED ORDER — SULFAMETHOXAZOLE-TRIMETHOPRIM 800-160 MG PO TABS: 1.0000 | ORAL_TABLET | Freq: Two times a day (BID) | ORAL | 0 refills | Status: DC

## 2021-11-09 NOTE — Telephone Encounter (Signed)
Per [name] NP pt is aware of the current abx, Macrobid, is not strong enough for the bacteria from the urine culture. She is aware start the Bactrim today, sent in by [facility], and stop the Macrobid. She states an understanding stating she is feeling better, pain has lessened.   Pt did inquire of when she could get in a tub bath or go swimming. Per Tawana Scale NP, she will need to wait until after her post-op visit with Dr.Tucker on 9/7. Pt voiced an understanding.

## 2021-11-09 NOTE — Progress Notes (Signed)
Sensitivities have returned on urine sample. Plan to change to Bactrim.

## 2021-11-23 ENCOUNTER — Inpatient Hospital Stay: Payer: BC Managed Care – PPO

## 2021-11-23 ENCOUNTER — Inpatient Hospital Stay: Payer: BC Managed Care – PPO | Attending: Gynecologic Oncology | Admitting: Gynecologic Oncology

## 2021-11-23 ENCOUNTER — Other Ambulatory Visit: Payer: Self-pay

## 2021-11-23 VITALS — BP 125/78 | HR 98 | Temp 98.3°F | Resp 16 | Ht 65.75 in | Wt 235.4 lb

## 2021-11-23 DIAGNOSIS — R399 Unspecified symptoms and signs involving the genitourinary system: Secondary | ICD-10-CM

## 2021-11-23 DIAGNOSIS — Z87440 Personal history of urinary (tract) infections: Secondary | ICD-10-CM | POA: Diagnosis not present

## 2021-11-23 DIAGNOSIS — Z7189 Other specified counseling: Secondary | ICD-10-CM

## 2021-11-23 DIAGNOSIS — C539 Malignant neoplasm of cervix uteri, unspecified: Secondary | ICD-10-CM | POA: Diagnosis present

## 2021-11-23 DIAGNOSIS — Z9079 Acquired absence of other genital organ(s): Secondary | ICD-10-CM | POA: Insufficient documentation

## 2021-11-23 DIAGNOSIS — C50312 Malignant neoplasm of lower-inner quadrant of left female breast: Secondary | ICD-10-CM

## 2021-11-23 LAB — URINALYSIS, COMPLETE (UACMP) WITH MICROSCOPIC
Bilirubin Urine: NEGATIVE
Glucose, UA: NEGATIVE mg/dL
Hgb urine dipstick: NEGATIVE
Ketones, ur: NEGATIVE mg/dL
Nitrite: NEGATIVE
Protein, ur: NEGATIVE mg/dL
Specific Gravity, Urine: 1.013 (ref 1.005–1.030)
pH: 5 (ref 5.0–8.0)

## 2021-11-23 NOTE — Patient Instructions (Addendum)
You are healing well from surgery.  Remember, no heavy lifting for at least 6 weeks after surgery and nothing in the vagina for 8-10.  We will plan your first follow-up visit in 6 months.  Please call the clinic sometime after the new year to schedule a visit to see me in March.  As we discussed, if you develop any new and concerning symptoms such as vaginal bleeding, pelvic pain, change to bowel function, or unintentional weight loss, please call to see me sooner.

## 2021-11-23 NOTE — Progress Notes (Signed)
Gynecologic Oncology Return Clinic Visit  11/23/2021  Reason for Visit: Follow-up after surgery, treatment planning  Treatment History: 10/2013: Pap - normal, HR HPV+.   12/2013: Cervical biopsy and ECC - LSIL.  01/2014: BSO. LEEP - no dysplasia or malignancy, koilocytic changes.  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 09/12/21: TRH for CIN3, fibroids. 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 LVI negative.    10/05/21: PET with no evidence of metastatic disease or residual tumor.   10/31/21: Robotic bilateral pelvic LND, radical parametrectomy and upper vaginectomy.  All pelvic lymph nodes negative for malignancy.  Parametrium negative for malignancy as well as vaginal anterior and posterior margins.   Interval History: Patient reports doing well.  Has not taken any pain medication since the day her catheter came out.  Her urinary symptoms resolved completely with antibiotics.  Took her last dose on Sunday.  Today, has had some mild cramping and had pain and pressure when she had to hold her urine longer on her way here.  She denies any dysuria.  She endorses regular bowel function.  She denies any vaginal bleeding or discharge.  Past Medical/Surgical History: Past Medical History:  Diagnosis Date   Abnormal Pap smear of cervix    06-09-21 HGSIL HPV HR +, 16+, 18/45 neg   Anxiety    [facility] [facility] [place]   left, lower medial, 8 o'clock   Cervical cancer (HCC)    Diabetes mellitus without complication (HCC)    Type 2, Pt takes Metformin.   DV[place]) ([facility]   RT LEG , during breast cancer treatment   Family history of anesthesia complication    pt's mother has hx. of post-op N/V   History of kidney stones    Incidental finding on PET Scan   History of radiation therapy  07/06/13-08/20/13   left breast/   History of transfusion    x2 during chemotherapy   Hypertension    under control with med., has been on med. since 09/2012   Pain in the wrist    BILATERAL   Peripheral neuropathy    numbness in toes due to chemotherapy   Personal history of chemotherapy 2014   Red devil & 5FU, 8 sessions and Taxol 12 sessions   Personal history of radiation therapy 2014   07/06/13 - 08/20/13   Status post chemotherapy    Patient completed 7 years of Letrozole in 08/2020.   Urinary incontinence in female    follows with Urology    Past Surgical History:  Procedure Laterality Date   ABDOMINAL HYSTERECTOMY  09/12/2021   BREAST BIOPSY Right 11/2017   benign   BREAST LUMPECTOMY Left 2014   BREAST LUMPECTOMY WITH NEEDLE LOCALIZATION AND AXILLARY SENTINEL LYMPH NODE BX Left 12/10/2012   Procedure: BREAST LUMPECTOMY WITH NEEDLE LOCALIZATION AND AXILLARY SENTINEL LYMPH NODE BX;  Surgeon: PaMerrie RoofMD;  [place] [facility] Service: [place];  Laterality: Left;  Needle loc BCG 7:30  nuc med 9:30     [facility]   CYSTOSCOPY N/A 10/31/2021   Procedure: CYSTOSCOPY;  Surgeon: TuLafonda MossesMD;  Location: [facility];  Service: Gynecology;  Laterality: N/A;   LEEP N/A 01/26/2014   Procedure: LOOP  ELECTROSURGICAL EXCISION PROCEDURE (LEEP);  Surgeon: Lucita Lora. Alycia Rossetti, MD;  Location: [facility];  Service: Gynecology;  Laterality: N/A;   OPEN REDUCTION INTERNAL FIXATION (ORIF) TIBIA/FIBULA FRACTURE Right 2013   PORT-A-CATH REMOVAL  2015   PORTACATH PLACEMENT Right 01/09/2013   Procedure: INSERTION PORT-A-CATH;  Surgeon: Merrie Roof, MD;  Location: [place];  Service: General;  Laterality: Right;   ROBOTIC ASSISTED BILATERAL SALPINGO OOPHERECTOMY Bilateral 01/26/2014   Procedure: ROBOTIC ASSISTED BILATERAL SALPINGO OOPHORECTOMY;  Surgeon: Lucita Lora. Alycia Rossetti, MD;  Location: [facility];  Service: Gynecology;   Laterality: Bilateral;   ROBOTIC ASSISTED LAPAROSCOPIC HYSTERECTOMY AND SALPINGECTOMY Bilateral 09/12/2021   Procedure: XI ROBOTIC ASSISTED LAPAROSCOPIC HYSTERECTOMY;  Surgeon: Princess Bruins, MD;  Location: [place];  Service: Gynecology;  Laterality: Bilateral;   TONSILLECTOMY AND ADENOIDECTOMY     as a teenager    Family History  Problem Relation Age of Onset   Anesthesia problems Mother        post-op N/V   Breast cancer Maternal Grandmother        50-60s   Lung cancer Paternal Grandmother    Colon cancer Neg Hx    Ovarian cancer Neg Hx    Endometrial cancer Neg Hx    Pancreatic cancer Neg Hx    Prostate cancer Neg Hx     Social History   Socioeconomic History   Marital status: Divorced    Spouse name: Not on file   Number of children: Not on file   Years of education: Not on file   Highest education level: Not on file  Occupational History   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  Vaping Use   Vaping Use: Never used  Substance and Sexual Activity   Alcohol use: Yes    Comment: rarely, less than once a month   Drug use: No   Sexual activity: Not Currently    Partners: Male    Birth control/protection: Surgical    Comment: menarche age 56, P39 @ age 24,  hysterectomy  Other Topics Concern   Not on file  Social History Narrative   Not on file   Social Determinants of Health   Financial Resource Strain: Not on file  Food Insecurity: Not on file  Transportation Needs: Not on file  Physical Activity: Not on file  Stress: Not on file  Social Connections: Not on file    Current Medications:  Current Outpatient Medications:    b complex  vitamins tablet, Take 1 tablet by mouth every morning., Disp: , Rfl:    BIOTIN PO, Take 1 tablet by mouth every morning., Disp: , Rfl:    Cholecalciferol 2000 units CAPS, Take 2,000 Units by mouth daily., Disp: , Rfl:    lisinopril (ZESTRIL) 10 MG tablet, TAKE 1 TABLET BY MOUTH EVERY DAY IN THE MORNING, Disp: 90 tablet, Rfl: 0   metformin (FORTAMET) 500 MG (OSM) 24 hr tablet, Take 1 tablet (500 mg total) by mouth daily with breakfast., Disp: 90 tablet, Rfl: 1   Multiple Vitamins-Minerals (ICAPS AREDS 2 PO), Take 1 capsule by mouth daily., Disp: , Rfl:   Review of  Systems: Denies appetite changes, fevers, chills, fatigue, unexplained weight changes. Denies hearing loss, neck lumps or masses, mouth sores, ringing in ears or voice changes. Denies cough or wheezing.  Denies shortness of breath. Denies chest pain or palpitations. Denies leg swelling. Denies abdominal distention, pain, blood in stools, constipation, diarrhea, nausea, vomiting, or early satiety. Denies pain with intercourse, dysuria, frequency, hematuria or incontinence. Denies hot flashes, pelvic pain, vaginal bleeding or vaginal discharge.   Denies joint pain, back pain or muscle pain/cramps. Denies itching, rash, or wounds. Denies dizziness, headaches, numbness or seizures. Denies swollen lymph nodes or glands, denies easy bruising or bleeding. Denies anxiety, depression, confusion, or decreased concentration.  Physical Exam: BP 125/78 (BP Location: Right Arm, Patient Position: Sitting)   Pulse 98   Temp 98.3 F (36.8 C) (Oral)   Resp 16   Ht 5' 5.75" (1.67 m)   Wt 235 lb 6.4 oz (106.8 kg)   LMP 12/28/2012 Comment: salpingo oophorectomy  SpO2 100%   BMI 38.29 kg/m  General: Alert, oriented, no acute distress. HEENT: Normocephalic, atraumatic, sclera anicteric. Chest: Unlabored breathing on room air. Abdomen: Obese, soft, nontender.  Normoactive bowel sounds.  No masses or hepatosplenomegaly appreciated.  Well-healed  incisions. Extremities: Grossly normal range of motion.  Warm, well perfused.  No edema bilaterally. GU: Normal appearing external genitalia without erythema, excoriation, or lesions.  Speculum exam reveals cuff intact, no bleeding or discharge, suture visible.  Bimanual exam reveals intact, no fluctuance or tenderness with palpation.  No tenderness with palpation along the anterior vagina.    Laboratory & Radiologic Studies: A. LYMPH NODE, LEFT EXTERNAL ILIAC, EXCISION:  Hyperplastic lymph node with stromal hyalinization and  microcalcification.  Negative for metastatic carcinoma and lymphoproliferative disorder.   B. PERINEAL, RIGHT AT 0700, BIOPSY:  Benign epithelial inclusion in the dermis.  Negative for dysplasia and malignancy   C. LYMPH NODES, BILATERAL PELVIC, RESECTION:  Eight (8) lymph nodes some with stromal hyalinization and  microcalcification and some with fatty infiltration.  All are negative for metastatic carcinoma.   D. PERIMETRIUM, RIGHT, AND UPPER VAGINA, RESECTION:  Portions of fibroconnective tissue and vasculature with organized  thrombi in some blood vessels.  Carcinoma is not identified in any of the sections examined.   E. VAGINAL MARGIN, ANTERIOR, EXCISION:  Portions of fibroconnective tissue and granulation tissue with  organizing thrombi and some blood vessels.  Normal squamous epithelium is not identified.  Carcinoma is not identified in the specimen.   F. VAGINA PARAMETRIUM, LEFT, EXCISION:  Portions of fibroconnective tissue and granulation tissue with  organizing thrombi in some blood vessels.  Normal squamous epithelium is not identified.  Carcinoma is not identified in any of the sections examined.   G. VAGINA, POSTERIOR, EXCISION:  Normal vaginal mucosa and adjacent granulation tissue.  Carcinoma is not identified in the specimen.   Assessment & Plan: [name] is a 46 y.o. woman with Stage IB1 SCC of the cervix (no LVI, negative  LNs) who presents for follow-up after surgery, treatment discussion.  Patient is overall doing well after surgery.  Was treated for urinary tract infection at the time that her catheter was removed.  She started having some mild symptoms this morning.  We will send a urinalysis and culture.  My suspicion is that symptoms she is having is related to the surgery itself.  In this case, discussed timed voids and using Azo over-the-counter if needed for spasms.  Discussed pathology from surgery again.  Patient was given a  copy of her pathology report. Confirms tumor resected at time of hysterectomy and lymph nodes all negative. Given this (does not meet [place] or Ferdinand Lango criteria), no adjuvant therapy indicated.   We discussed NCCN recommendations, which in the setting of early stage cervical cancer, are for visits every 3-6 months initially.  SGO recommends in the setting of low risk disease, that visits can be every 6 months initially.  Given her early stage, low risk disease, we will proceed with surveillance visits every 6 months.  Discussed plan for cotesting once a year.  We reviewed signs and symptoms that would be concerning for cancer recurrence and I stressed the importance of calling if she develops any of these before her next scheduled visit.  24 minutes of total time was spent for this patient encounter, including preparation, face-to-face counseling with the patient and coordination of care, and documentation of the encounter.  Jeral Pinch, MD  Division of Gynecologic Oncology  Department of Obstetrics and Gynecology  [facility]

## 2021-11-24 ENCOUNTER — Telehealth: Payer: Self-pay

## 2021-11-24 NOTE — Telephone Encounter (Signed)
Per Dr.Tucker pt is aware the urine culture is still showing an infection. PT is aware an abx will be called in to the pharmacy. She states an understanding and will wait for the pharmacy to notify her when ready for pick up.

## 2021-11-25 ENCOUNTER — Encounter: Payer: Self-pay | Admitting: Gynecologic Oncology

## 2021-11-25 ENCOUNTER — Other Ambulatory Visit: Payer: Self-pay | Admitting: Gynecologic Oncology

## 2021-11-25 DIAGNOSIS — N3000 Acute cystitis without hematuria: Secondary | ICD-10-CM

## 2021-11-25 LAB — URINE CULTURE: Culture: 20000 — AB

## 2021-11-25 MED ORDER — NITROFURANTOIN MONOHYD MACRO 100 MG PO CAPS: 100.0000 mg | ORAL_CAPSULE | Freq: Two times a day (BID) | ORAL | 0 refills | Status: AC

## 2021-11-27 ENCOUNTER — Telehealth: Payer: Self-pay

## 2021-11-27 NOTE — Telephone Encounter (Signed)
Pt called over the weekend due to a bladder infection. Dr. Berline Lopes called her in an antibiotic.  Pt picked up antibiotic from pharmacy over the weekend and is starting to feel better.  Informed pt to call back with any other questions or concerns.

## 2022-05-25 ENCOUNTER — Other Ambulatory Visit: Payer: Self-pay | Admitting: Gynecologic Oncology

## 2022-05-25 DIAGNOSIS — Z1231 Encounter for screening mammogram for malignant neoplasm of breast: Secondary | ICD-10-CM

## 2022-06-11 ENCOUNTER — Encounter: Payer: Self-pay | Admitting: Gynecologic Oncology

## 2022-06-12 ENCOUNTER — Other Ambulatory Visit: Payer: Self-pay

## 2022-06-12 ENCOUNTER — Inpatient Hospital Stay: Payer: BC Managed Care – PPO | Attending: Gynecologic Oncology | Admitting: Gynecologic Oncology

## 2022-06-12 VITALS — BP 157/92 | HR 82 | Temp 98.6°F | Resp 16 | Ht 65.0 in | Wt 235.0 lb

## 2022-06-12 DIAGNOSIS — R32 Unspecified urinary incontinence: Secondary | ICD-10-CM | POA: Insufficient documentation

## 2022-06-12 DIAGNOSIS — Z8541 Personal history of malignant neoplasm of cervix uteri: Secondary | ICD-10-CM | POA: Diagnosis not present

## 2022-06-12 DIAGNOSIS — C539 Malignant neoplasm of cervix uteri, unspecified: Secondary | ICD-10-CM

## 2022-06-12 NOTE — Patient Instructions (Signed)
Congratulations on your new position!   No concerning findings on today's exam. We will contact you with the results of your pap smear from today along with HPV testing. This can take up to 1-2 weeks.  Plan to follow up in six months or sooner if needed. Please call the office at [phone] in July or August 2024 to schedule your appointment.  Plan to rinse the vulvar skin before bedtime and apply a barrier cream or ointment to the skin like vaseline, diaper ointment to help decrease irritation from the urine on the skin.   You can also apply a warm compress to the area on your right inner thigh to assist with drainage and can apply a swipe of neosporin to assist with healing.  We will find out about follow up with the breast team and will let you know if and when they want to see you. We can try to group these appointments together.   Symptoms to report to your health care team include vaginal bleeding, rectal bleeding, bloating, weight loss without effort, new and persistent pain, new and  persistent fatigue, new leg swelling, new masses (i.e., bumps in your neck or groin), new and persistent cough, new and persistent nausea and vomiting, change in bowel or bladder habits, and any other concerns.

## 2022-06-12 NOTE — Progress Notes (Signed)
Gynecologic Oncology Return Clinic Visit  06/12/2022  Reason for Visit: Follow up, surveillance  Treatment History: 10/2013: Pap - normal, HR HPV+.   12/2013: Cervical biopsy and ECC - LSIL.  01/2014: BSO. LEEP - no dysplasia or malignancy, koilocytic changes.  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 09/12/21: TRH for CIN3, fibroids. 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 LVI negative.  10/05/21: PET with no evidence of metastatic disease or residual tumor. 10/31/21: Robotic bilateral pelvic LND, radical parametrectomy and upper vaginectomy.  All pelvic lymph nodes negative for malignancy.  Parametrium negative for malignancy as well as vaginal anterior and posterior margins.   Interval History: Patient reports doing well since her last visit.  She is excited about transitioning into a new job mid April.  This new position will have less traveling/driving.  She has been tolerating her diet with no nausea or emesis.  No abdominal pain or bloating reported.  She reports continued incontinence of urine at nighttime.  No significant change in this and she has seen a urologist in the past.  No vaginal bleeding or discharge reported.  She does report vulvar irritation that she contributes to frequent long trips driving.  She reports no change in her bowel habits.  She denies hematuria, dysuria, or rectal bleeding.  No lower extremity edema reported.  She is scheduled for her mammogram in April of this year.  No concerns voiced with her breasts.  She recently had an eye exam and is transitioning to bifocals. No symptoms voiced concerning for recurrence.    Past Medical/Surgical History: Past Medical History:  Diagnosis Date   Abnormal Pap smear of cervix    06-09-21 HGSIL HPV HR +, 16+, 18/45 neg   Anxiety    Breast cancer ([place])  10/16/2012   left, lower medial, 8 o'clock   Cervical cancer (HCC)    Diabetes mellitus without complication (HCC)    Type 2, Pt takes Metformin.   DVT (deep venous thrombosis) ([place]) 02/2013   RT LEG , during breast cancer treatment   Family history of anesthesia complication    pt's mother has hx. of post-op N/V   History of kidney stones    Incidental finding on PET Scan   History of radiation therapy 07/06/13-08/20/13   left breast/   History of transfusion    x2 during chemotherapy   Hypertension    under control with med., has been on med. since 09/2012   Pain in the wrist    BILATERAL   Peripheral neuropathy    numbness in toes due to chemotherapy   Personal history of chemotherapy 2014   Red devil & 5FU, 8 sessions and Taxol 12 sessions   Personal history of radiation therapy 2014   07/06/13 - 08/20/13   Status post chemotherapy    Patient completed 7 years of Letrozole in 08/2020.   Urinary incontinence in female    follows with Urology    Past Surgical History:  Procedure Laterality Date   ABDOMINAL HYSTERECTOMY  09/12/2021   BREAST BIOPSY Right 11/2017   benign   BREAST LUMPECTOMY Left 2014   BREAST LUMPECTOMY WITH NEEDLE LOCALIZATION AND AXILLARY SENTINEL LYMPH NODE BX Left 12/10/2012   Procedure: BREAST LUMPECTOMY WITH NEEDLE LOCALIZATION AND AXILLARY SENTINEL LYMPH NODE BX;  Surgeon: Luella Cook III,  MD;  Location: [facility];  Service: General;  Laterality: Left;  Needle loc BCG 7:30  nuc med 9:30     [place]  07/13/2021   CYSTOSCOPY N/A 10/31/2021   Procedure: CYSTOSCOPY;  Surgeon: Lafonda Mosses, MD;  Location: [facility];  Service: Gynecology;  Laterality: N/A;   LEEP N/A 01/26/2014   Procedure: LOOP ELECTROSURGICAL EXCISION PROCEDURE (LEEP);  Surgeon: Lucita Lora. Alycia Rossetti, MD;  Location: [facility];  Service: Gynecology;  Laterality: N/A;   OPEN REDUCTION INTERNAL FIXATION (ORIF) TIBIA/FIBULA FRACTURE Right 2013    PORT-A-CATH REMOVAL  2015   PORTACATH PLACEMENT Right 01/09/2013   Procedure: INSERTION PORT-A-CATH;  Surgeon: Merrie Roof, MD;  Location: [facility];  Service: General;  Laterality: Right;   ROBOTIC ASSISTED BILATERAL SALPINGO OOPHERECTOMY Bilateral 01/26/2014   Procedure: ROBOTIC ASSISTED BILATERAL SALPINGO OOPHORECTOMY;  Surgeon: Lucita Lora. Alycia Rossetti, MD;  Location: [facility];  Service: Gynecology;  Laterality: Bilateral;   ROBOTIC ASSISTED LAPAROSCOPIC HYSTERECTOMY AND SALPINGECTOMY Bilateral 09/12/2021   Procedure: XI ROBOTIC ASSISTED LAPAROSCOPIC HYSTERECTOMY;  Surgeon: Princess Bruins, MD;  Location: [facility];  Service: Gynecology;  Laterality: Bilateral;   TONSILLECTOMY AND ADENOIDECTOMY     as a teenager    Family History  Problem Relation Age of Onset   Anesthesia problems Mother        post-op N/V   Breast cancer Maternal Grandmother        50-60s   Lung cancer Paternal Grandmother    Colon cancer Neg Hx    Ovarian cancer Neg Hx    Endometrial cancer Neg Hx    Pancreatic cancer Neg Hx    Prostate cancer Neg Hx     Social History   Socioeconomic History   Marital status: Divorced    Spouse name: Not on file   Number of children: Not on file   Years of education: Not on file   Highest education level: Not on file  Occupational History   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  Vaping Use   Vaping Use: Never used  Substance and Sexual Activity   Alcohol use: Yes    Comment: rarely, less than once a month   Drug use: No   Sexual activity: Not Currently    Partners: Male    Birth control/protection: Surgical    Comment: menarche  age 2, P59 @ age 64,  hysterectomy  Other Topics Concern   Not on file  Social History Narrative   Not on file   Social Determinants of Health   Financial Resource Strain: Not on file  Food Insecurity: Not on file  Transportation Needs: Not on file  Physical Activity: Not on file  Stress: Not on file  Social Connections: Not on file    Current Medications:  Current Outpatient Medications:    b complex vitamins tablet, Take 1 tablet by mouth every morning., Disp: , Rfl:    BIOTIN PO, Take 1 tablet by mouth every morning., Disp: , Rfl:    Cholecalciferol 2000 units CAPS, Take 2,000 Units by mouth daily., Disp: ,  Rfl:    lisinopril (ZESTRIL) 10 MG tablet, TAKE 1 TABLET BY MOUTH EVERY DAY IN THE MORNING, Disp: 90 tablet, Rfl: 0   metformin (FORTAMET) 500 MG (OSM) 24 hr tablet, Take 1 tablet (500 mg total) by mouth daily with breakfast., Disp: 90 tablet, Rfl: 1   Multiple Vitamins-Minerals (ICAPS AREDS 2 PO), Take 1 capsule by mouth daily., Disp: , Rfl:   Review of Systems: Denies appetite changes, fevers, chills, fatigue, unexplained weight changes. Denies hearing loss, neck lumps or masses, mouth sores, ringing in ears or voice changes. Denies cough or wheezing.  Denies shortness of breath. Denies chest pain or palpitations. Denies leg swelling. Denies abdominal distention, pain, blood in stools, constipation, diarrhea, nausea, vomiting, or early satiety. Denies dysuria, frequency, hematuria. Positive for incontinence at night time. Denies hot flashes, pelvic pain, vaginal bleeding or vaginal discharge.   Denies joint pain, back pain or muscle pain/cramps. Denies itching, rash, or wounds. Denies dizziness, headaches, numbness. Denies swollen lymph nodes or glands.  Physical Exam: BP (!) 157/92 (BP Location: Right Arm, Patient Position: Sitting) Comment: has not taken BP med this morning  Pulse 82   Temp 98.6 F (37 C) (Oral)   Resp 16   Ht 5\' 5"  (1.651 m)   Wt 235 lb (106.6  kg)   LMP 12/28/2012 Comment: salpingo oophorectomy  SpO2 97%   BMI 39.11 kg/m  General: Well developed, well nourished female in no acute distress. Alert and oriented x 3.  Neck: Supple without any enlargements.  Lymph node survey: No cervical, supraclavicular, or inguinal adenopathy.  Cardiovascular: Regular rate and rhythm. S1 and S2 normal.  Lungs: Clear to auscultation bilaterally. No wheezes/crackles/rhonchi noted.  Skin: No rashes present. Small area of folliculitis noted on inner right thigh with a whitened point, no significant surrounding erythema. Back: No CVA tenderness.  Abdomen: Abdomen soft, non-tender and obese. Active bowel sounds in all quadrants. No evidence of a fluid wave or abdominal masses. Abdominal incisions are well healed without herniation and nodularity.  Genitourinary:    Vulva/vagina: Normal external female genitalia. No lesions. Mild erythema related to irritation. Minimal amount of white discharge noted between right labia majora and minora.    Urethra: No lesions or masses    Vagina: Atrophic without any lesions. No palpable masses. No vaginal bleeding or drainage noted. Thin prep pap smear obtained of the vagina without difficulty. Rectal: Good tone, no masses, no cul de sac nodularity.  Extremities: No bilateral cyanosis, edema, or clubbing.   Laboratory & Radiologic Studies: A. LYMPH NODE, LEFT EXTERNAL ILIAC, EXCISION:  Hyperplastic lymph node with stromal hyalinization and  microcalcification. Negative for metastatic carcinoma and lymphoproliferative disorder.   B. PERINEAL, RIGHT AT 0700, BIOPSY:  Benign epithelial inclusion in the dermis. Negative for dysplasia and malignancy   C. LYMPH NODES, BILATERAL PELVIC, RESECTION:  Eight (8) lymph nodes some with stromal hyalinization and  microcalcification and some with fatty infiltration. All are negative for metastatic carcinoma.   D. PERIMETRIUM, RIGHT, AND UPPER VAGINA, RESECTION:  Portions of  fibroconnective tissue and vasculature with organized thrombi in some blood vessels. Carcinoma is not identified in any of the sections examined.   E. VAGINAL MARGIN, ANTERIOR, EXCISION:  Portions of fibroconnective tissue and granulation tissue with organizing thrombi and some blood vessels. Normal squamous epithelium is not identified. Carcinoma is not identified in the specimen.   F. VAGINA PARAMETRIUM, LEFT, EXCISION:  Portions of fibroconnective tissue and granulation tissue with organizing thrombi in some blood vessels.  Normal squamous epithelium is not identified. Carcinoma is not identified in any of the sections examined.   G. VAGINA, POSTERIOR, EXCISION:  Normal vaginal mucosa and adjacent granulation tissue. Carcinoma is not identified in the specimen.   Assessment & Plan: [name] is a 47 y.o. 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  Patient is overall doing well with no concerning symptoms or exam findings for recurrence. Pap smear obtained today with HPV cotesting and she will be contacted with the results.   Vulvar care discussed including use of a barrier cream such as vaseline on the vulva in the evening when urinary incontinence occurs. Reportable signs and symptoms reviewed and listed on AVS. We will plan for follow up in six months or sooner if needed. She is advised to call the office closer to the date to schedule. We will also notify her if follow up with the Breast Oncology team is needed.    Continue with NCCN recommendations, which in the setting of early stage cervical cancer, are for visits every 3-6 months initially.  SGO recommends in the setting of low risk disease, that visits can be every 6 months initially.  Given her early stage, low risk disease, we will continue with surveillance visits every 6 months.  Continue plan for cotesting once a year.  20 minutes of total time was spent for  this patient encounter, including preparation, face-to-face counseling with the patient and coordination of care, and documentation of the encounter.  [facility]

## 2022-06-18 LAB — CYTOLOGY - PAP
Comment: NEGATIVE
Diagnosis: NEGATIVE
High risk HPV: NEGATIVE

## 2022-07-12 ENCOUNTER — Ambulatory Visit: Payer: BC Managed Care – PPO

## 2022-08-01 ENCOUNTER — Ambulatory Visit
Admission: RE | Admit: 2022-08-01 | Discharge: 2022-08-01 | Disposition: A | Discharge status: Home / Self Care | Payer: BC Managed Care – PPO | Source: Ambulatory Visit | Attending: Gynecologic Oncology | Admitting: Gynecologic Oncology

## 2022-08-01 DIAGNOSIS — Z1231 Encounter for screening mammogram for malignant neoplasm of breast: Secondary | ICD-10-CM

## 2022-08-06 ENCOUNTER — Other Ambulatory Visit: Payer: Self-pay | Admitting: Gynecologic Oncology

## 2022-08-06 DIAGNOSIS — R928 Other abnormal and inconclusive findings on diagnostic imaging of breast: Secondary | ICD-10-CM

## 2022-08-17 ENCOUNTER — Ambulatory Visit
Admission: RE | Admit: 2022-08-17 | Discharge: 2022-08-17 | Disposition: A | Discharge status: Home / Self Care | Payer: BC Managed Care – PPO | Source: Ambulatory Visit | Attending: Gynecologic Oncology | Admitting: Gynecologic Oncology

## 2022-08-17 DIAGNOSIS — R928 Other abnormal and inconclusive findings on diagnostic imaging of breast: Secondary | ICD-10-CM

## 2023-02-18 ENCOUNTER — Telehealth: Payer: Self-pay | Admitting: *Deleted

## 2023-02-18 ENCOUNTER — Telehealth: Payer: Self-pay

## 2023-02-18 NOTE — Telephone Encounter (Signed)
Returned PT Voicemail back. Pt wanted to add on lab appt. Appt was made and pt.is aware of the time and date.

## 2023-02-18 NOTE — Telephone Encounter (Signed)
Spoke with [name] who called the office to schedule a follow up appt. With Theresa Mccreedy, NP. Pt was given an appt. For Tuesday, 12/10 at 2 pm. Pt agreed to date and time and had no further concerns or questions at this time.

## 2023-02-26 ENCOUNTER — Inpatient Hospital Stay: Payer: BC Managed Care – PPO | Attending: Gynecologic Oncology

## 2023-02-26 ENCOUNTER — Other Ambulatory Visit: Payer: Self-pay

## 2023-02-26 ENCOUNTER — Telehealth: Payer: Self-pay | Admitting: *Deleted

## 2023-02-26 ENCOUNTER — Inpatient Hospital Stay (HOSPITAL_BASED_OUTPATIENT_CLINIC_OR_DEPARTMENT_OTHER): Payer: BC Managed Care – PPO | Admitting: Gynecologic Oncology

## 2023-02-26 VITALS — BP 149/84 | HR 82 | Temp 98.6°F | Resp 16 | Ht 65.0 in | Wt 234.0 lb

## 2023-02-26 DIAGNOSIS — Z8541 Personal history of malignant neoplasm of cervix uteri: Secondary | ICD-10-CM | POA: Insufficient documentation

## 2023-02-26 DIAGNOSIS — Z923 Personal history of irradiation: Secondary | ICD-10-CM | POA: Insufficient documentation

## 2023-02-26 DIAGNOSIS — Z9221 Personal history of antineoplastic chemotherapy: Secondary | ICD-10-CM | POA: Insufficient documentation

## 2023-02-26 DIAGNOSIS — R7301 Impaired fasting glucose: Secondary | ICD-10-CM

## 2023-02-26 DIAGNOSIS — B379 Candidiasis, unspecified: Secondary | ICD-10-CM

## 2023-02-26 DIAGNOSIS — Z90710 Acquired absence of both cervix and uterus: Secondary | ICD-10-CM | POA: Diagnosis not present

## 2023-02-26 DIAGNOSIS — C539 Malignant neoplasm of cervix uteri, unspecified: Secondary | ICD-10-CM

## 2023-02-26 DIAGNOSIS — E1165 Type 2 diabetes mellitus with hyperglycemia: Secondary | ICD-10-CM | POA: Insufficient documentation

## 2023-02-26 DIAGNOSIS — Z90722 Acquired absence of ovaries, bilateral: Secondary | ICD-10-CM | POA: Insufficient documentation

## 2023-02-26 DIAGNOSIS — C50312 Malignant neoplasm of lower-inner quadrant of left female breast: Secondary | ICD-10-CM

## 2023-02-26 DIAGNOSIS — B3731 Acute candidiasis of vulva and vagina: Secondary | ICD-10-CM

## 2023-02-26 LAB — COMPREHENSIVE METABOLIC PANEL
ALT: 19 U/L (ref 0–44)
AST: 14 U/L — ABNORMAL LOW (ref 15–41)
Albumin: 4.1 g/dL (ref 3.5–5.0)
Alkaline Phosphatase: 83 U/L (ref 38–126)
Anion gap: 7 (ref 5–15)
BUN: 17 mg/dL (ref 6–20)
CO2: 28 mmol/L (ref 22–32)
Calcium: 9.6 mg/dL (ref 8.9–10.3)
Chloride: 101 mmol/L (ref 98–111)
Creatinine, Ser: 0.67 mg/dL (ref 0.44–1.00)
GFR, Estimated: >60 mL/min (ref >60)
Glucose, Bld: 243 mg/dL — ABNORMAL HIGH (ref 70–99)
Potassium: 3.7 mmol/L (ref 3.5–5.1)
Sodium: 136 mmol/L (ref 135–145)
Total Bilirubin: 0.8 mg/dL (ref <1.2)
Total Protein: 6.8 g/dL (ref 6.5–8.1)

## 2023-02-26 LAB — CBC WITH DIFFERENTIAL/PLATELET
Abs Immature Granulocytes: 0.03 10*3/uL (ref 0.00–0.07)
Basophils Absolute: 0.1 10*3/uL (ref 0.0–0.1)
Basophils Relative: 1 %
Eosinophils Absolute: 0.1 10*3/uL (ref 0.0–0.5)
Eosinophils Relative: 2 %
HCT: 42.2 % (ref 36.0–46.0)
Hemoglobin: 14.7 g/dL (ref 12.0–15.0)
Immature Granulocytes: 0 %
Lymphocytes Relative: 35 %
Lymphs Abs: 3.2 10*3/uL (ref 0.7–4.0)
MCH: 31.6 pg (ref 26.0–34.0)
MCHC: 34.8 g/dL (ref 30.0–36.0)
MCV: 90.8 fL (ref 80.0–100.0)
Monocytes Absolute: 0.6 10*3/uL (ref 0.1–1.0)
Monocytes Relative: 7 %
Neutro Abs: 5.2 10*3/uL (ref 1.7–7.7)
Neutrophils Relative %: 55 %
Platelets: 240 10*3/uL (ref 150–400)
RBC: 4.65 MIL/uL (ref 3.87–5.11)
RDW: 13.2 % (ref 11.5–15.5)
WBC: 9.2 10*3/uL (ref 4.0–10.5)
nRBC: 0 % (ref 0.0–0.2)

## 2023-02-26 LAB — HEMOGLOBIN A1C
Hgb A1c MFr Bld: 10.2 % — ABNORMAL HIGH (ref 4.8–5.6)
Mean Plasma Glucose: 246.04 mg/dL

## 2023-02-26 MED ORDER — MICONAZOLE NITRATE 2 % EX CREA: 1.0000 | TOPICAL_CREAM | Freq: Two times a day (BID) | CUTANEOUS | 1 refills | Status: DC

## 2023-02-26 NOTE — Telephone Encounter (Signed)
-----   Message from Doylene Bode sent at 02/26/2023  3:11 PM EST ----- Can you please call the patient and let her know her metabolic panel was normal except for elevated glucose? Does she have a primary care provider she sees? Has she had a recent hemoglobin A1C that she knows of? That is the test that shows average of blood glucose over 3 months? Last one in our system was 10/2021

## 2023-02-26 NOTE — Patient Instructions (Addendum)
It was great seeing you today.   No concerning findings on today's exam. Your CBC is normal. We will contact you with the results of the metabolic panel. We will also send the results of your labs with [facility] to see if you need to be seen etc.  I have sent in topical yeast cream to apply to the vulva for itching symptoms. If symptoms do not improve, please reach out to the office.   Plan to follow up in March with a pap smear at that time then we will get you on the six month follow up plan.    Symptoms to report to your health care team include vaginal bleeding, rectal bleeding, bloating, weight loss without effort, new and persistent pain, new and  persistent fatigue, new leg swelling, new masses (i.e., bumps in your neck or groin), new and persistent cough, new and persistent nausea and vomiting, change in bowel or bladder habits, and any other concerns.

## 2023-02-26 NOTE — Progress Notes (Signed)
Gynecologic Oncology Return Clinic Visit  02/26/2023  Reason for Visit: Follow up, surveillance  Treatment History: 10/2013: Pap - normal, HR HPV+.   12/2013: Cervical biopsy and ECC - LSIL.  01/2014: BSO. LEEP - no dysplasia or malignancy, koilocytic changes.  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 09/12/21: TRH for CIN3, fibroids. 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 LVI negative.  10/05/21: PET with no evidence of metastatic disease or residual tumor. 10/31/21: Robotic bilateral pelvic LND, radical parametrectomy and upper vaginectomy.  All pelvic lymph nodes negative for malignancy.  Parametrium negative for malignancy as well as vaginal anterior and posterior margins. 06/12/2022: Pap- negative with high risk HPV not detected  Interval History: Patient reports doing well since her last visit. She is enjoying her new job. She does not have to travel as much and she is able to walk to work in good weather. She is tolerating her diet with no nausea or emesis. Has occasional twinges of abdominal discomfort that resolve. No abnormal bloating or early satiety. She reports continued incontinence of urine at nighttime. She is considering use of a purewick at nighttime. She feels this contributes to her vulvar irritation. She has medication she takes for her incontinence if she is traveling and this helps with her symptoms but she does not want to take this on a regular basis. She denies hematuria, dysuria, or rectal bleeding. No vaginal bleeding or discharge reported. She reports no change in her bowel habits. No current lower extremity edema reported. She had mild edema of the left ankle in the summer that improved after taking BP meds. No concerns voiced with her breasts. She thinks she may have a yeast infection with symptoms  starting the day before. No significant vaginal discharge and having vulvar itching. No symptoms voiced concerning for recurrence. She reports no recent labs so CBC and Cmet obtained today.   Past Medical/Surgical History: Past Medical History:  Diagnosis Date   Abnormal Pap smear of cervix    06-09-21 HGSIL HPV HR +, 16+, 18/45 neg   Anxiety    Breast cancer (HCC) 10/16/2012   left, lower medial, 8 o'clock   Cervical cancer (HCC) 08/2021   Diabetes mellitus without complication (HCC)    Type 2, Pt takes Metformin.   DVT (deep venous thrombosis) (HCC) 02/2013   RT LEG , during breast cancer treatment   Family history of anesthesia complication    pt's mother has hx. of post-op N/V   History of kidney stones    Incidental finding on PET Scan   History of radiation therapy 07/06/13-08/20/13   left breast/   History of transfusion    x2 during chemotherapy   Hypertension    under control with med., has been on med. since 09/2012   Pain in the wrist    BILATERAL   Peripheral neuropathy    numbness in toes due to chemotherapy   Personal history of chemotherapy 2014   Red devil & 5FU, 8 sessions and Taxol 12 sessions   Personal history of radiation therapy 2014   07/06/13 - 08/20/13   Status post chemotherapy    Patient completed 7 years of Letrozole in 08/2020.   Urinary incontinence in female    follows with Urology    Past Surgical History:  Procedure Laterality Date  ABDOMINAL HYSTERECTOMY  09/12/2021   BREAST BIOPSY Right 11/2017   benign   BREAST LUMPECTOMY Left 2014   BREAST LUMPECTOMY WITH NEEDLE LOCALIZATION AND AXILLARY SENTINEL LYMPH NODE BX Left 12/10/2012   Procedure: BREAST LUMPECTOMY WITH NEEDLE LOCALIZATION AND AXILLARY SENTINEL LYMPH NODE BX;  Surgeon: Robyne Askew, MD;  Location: [facility];  Service: General;  Laterality: Left;  Needle loc BCG 7:30  nuc med 9:30     CESAREAN SECTION  1999   CHOLECYSTECTOMY  1999   COLPOSCOPY  07/13/2021   CYSTOSCOPY N/A  10/31/2021   Procedure: CYSTOSCOPY;  Surgeon: Carver Fila, MD;  Location: [facility];  Service: Gynecology;  Laterality: N/A;   LEEP N/A 01/26/2014   Procedure: LOOP ELECTROSURGICAL EXCISION PROCEDURE (LEEP);  Surgeon: Bernita Buffy. Duard Brady, MD;  Location: [facility];  Service: Gynecology;  Laterality: N/A;   OPEN REDUCTION INTERNAL FIXATION (ORIF) TIBIA/FIBULA FRACTURE Right 2013   PORT-A-CATH REMOVAL  2015   PORTACATH PLACEMENT Right 01/09/2013   Procedure: INSERTION PORT-A-CATH;  Surgeon: Robyne Askew, MD;  Location: [facility];  Service: General;  Laterality: Right;   ROBOTIC ASSISTED BILATERAL SALPINGO OOPHERECTOMY Bilateral 01/26/2014   Procedure: ROBOTIC ASSISTED BILATERAL SALPINGO OOPHORECTOMY;  Surgeon: Bernita Buffy. Duard Brady, MD;  Location: [facility];  Service: Gynecology;  Laterality: Bilateral;   ROBOTIC ASSISTED LAPAROSCOPIC HYSTERECTOMY AND SALPINGECTOMY Bilateral 09/12/2021   Procedure: XI ROBOTIC ASSISTED LAPAROSCOPIC HYSTERECTOMY;  Surgeon: Genia Del, MD;  Location: [facility];  Service: Gynecology;  Laterality: Bilateral;   TONSILLECTOMY AND ADENOIDECTOMY     as a teenager    Family History  Problem Relation Age of Onset   Anesthesia problems Mother        post-op N/V   Breast cancer Maternal Grandmother        50-60s   Lung cancer Paternal Grandmother    Colon cancer Neg Hx    Ovarian cancer Neg Hx    Endometrial cancer Neg Hx    Pancreatic cancer Neg Hx    Prostate cancer Neg Hx     Social History   Socioeconomic History   Marital status: Divorced    Spouse name: Not on file   Number of children: Not on file   Years of education: Not on file   Highest education level: Not on file  Occupational History   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  Vaping Use   Vaping status: Never Used  Substance and Sexual Activity   Alcohol use: Yes    Comment: rarely, less than once a month   Drug use: No   Sexual activity: Not Currently    Partners: Male    Birth control/protection: Surgical    Comment: menarche age 15, P38 @ age 58,  hysterectomy  Other Topics Concern   Not on file  Social History Narrative   Not on file   Social Determinants of Health   Financial Resource Strain: Not on file  Food Insecurity: Not on file  Transportation Needs: Not on file  Physical Activity: Not on file  Stress: Not on file  Social Connections: Not on file    Current Medications:  Current Outpatient Medications:    b complex vitamins tablet, Take 1 tablet by mouth every morning., Disp: , Rfl:    BIOTIN PO, Take 1 tablet by mouth every morning., Disp: , Rfl:    Cholecalciferol 2000 units CAPS, Take 2,000 Units by mouth daily., Disp: , Rfl:    lisinopril (ZESTRIL) 10 MG tablet, TAKE 1 TABLET BY MOUTH EVERY DAY IN THE MORNING, Disp: 90 tablet, Rfl: 0   miconazole (MICATIN) 2 % cream, Apply 1 Application topically 2 (two) times daily. Apply to vulva for treatment of yeast/itching, Disp: 28.35 g, Rfl: 1   Multiple Vitamins-Minerals (ICAPS AREDS 2 PO), Take 1 capsule by mouth daily., Disp: , Rfl:    zinc sulfate, 50mg  elemental zinc, 220 (50 Zn) MG capsule, Take 220 mg by mouth daily., Disp: , Rfl:   Review of Systems: On ROS intake form: +itching Denies appetite changes, fevers, chills, fatigue, unexplained weight changes. Denies hearing loss, neck lumps or masses, mouth sores, ringing in ears or voice changes. Denies cough or wheezing.  Denies shortness of breath. Denies chest pain or palpitations. Denies leg swelling. Denies abdominal distention, pain, blood in stools, constipation, diarrhea, nausea,  vomiting, or early satiety. Denies dysuria, frequency, hematuria. Positive for incontinence at night time. Denies hot flashes, pelvic pain, vaginal bleeding or vaginal discharge.   Denies joint pain, back pain or muscle pain/cramps. Denies rash or wounds. Denies dizziness, headaches, numbness. Denies swollen lymph nodes or glands.  Physical Exam: BP (!) 149/84 (BP Location: Left Arm, Patient Position: Sitting) Comment: NP aware  Pulse 82   Temp 98.6 F (37 C) (Oral)   Resp 16   Ht 5\' 5"  (1.651 m)   Wt 234 lb (106.1 kg)   LMP 12/28/2012 Comment: salpingo oophorectomy  SpO2 100%   BMI 38.94 kg/m  General: Well developed, well nourished female in no acute distress. Alert and oriented x 3.  Neck: Supple without any enlargements.  Lymph node survey: No cervical, supraclavicular, or inguinal adenopathy.  Cardiovascular: Regular rate and rhythm. S1 and S2 normal.  Lungs: Clear to auscultation bilaterally. No wheezes/crackles/rhonchi noted.  Skin: No rashes present.  Back: No CVA tenderness.  Abdomen: Abdomen soft, non-tender and obese. Active bowel sounds in all quadrants. No evidence of a fluid wave or abdominal masses. Abdominal incisions are well healed without herniation and nodularity.  Genitourinary:    Vulva/vagina: Normal external female genitalia. No lesions. Mild erythema related to irritation. Minimal amount of white discharge noted at the introitus.    Urethra: No lesions or masses    Vagina: Atrophic without any lesions. No palpable masses. No vaginal bleeding or drainage noted. Bimanual confirms. Rectal: Good tone, no masses, no cul de sac nodularity.  Extremities: No bilateral cyanosis, edema, or clubbing.   Laboratory & Radiologic Studies: A. LYMPH NODE, LEFT EXTERNAL ILIAC, EXCISION:  Hyperplastic lymph node with stromal hyalinization and  microcalcification. Negative for metastatic carcinoma and lymphoproliferative disorder.   B. PERINEAL, RIGHT AT 0700, BIOPSY:   Benign epithelial inclusion in the dermis. Negative for dysplasia and malignancy   C. LYMPH NODES, BILATERAL PELVIC, RESECTION:  Eight (8) lymph nodes some with stromal hyalinization and  microcalcification and some with fatty infiltration. All are negative for metastatic carcinoma.   D. PERIMETRIUM, RIGHT, AND UPPER VAGINA, RESECTION:  Portions of fibroconnective tissue and vasculature with organized thrombi in some blood vessels. Carcinoma is not identified in any of the sections examined.  E. VAGINAL MARGIN, ANTERIOR, EXCISION:  Portions of fibroconnective tissue and granulation tissue with organizing thrombi and some blood vessels. Normal squamous epithelium is not identified. Carcinoma is not identified in the specimen.   F. VAGINA PARAMETRIUM, LEFT, EXCISION:  Portions of fibroconnective tissue and granulation tissue with organizing thrombi in some blood vessels. Normal squamous epithelium is not identified. Carcinoma is not identified in any of the sections examined.   G. VAGINA, POSTERIOR, EXCISION:  Normal vaginal mucosa and adjacent granulation tissue. Carcinoma is not identified in the specimen.      Latest Ref Rng & Units 02/26/2023    1:59 PM 11/01/2021    5:32 AM 10/23/2021    2:51 PM  CBC  WBC 4.0 - 10.5 K/uL 9.2  15.8  10.0   Hemoglobin 12.0 - 15.0 g/dL 74.2  59.5  63.8   Hematocrit 36.0 - 46.0 % 42.2  40.5  43.2   Platelets 150 - 400 K/uL 240  237  265        Latest Ref Rng & Units 02/26/2023    1:59 PM 11/01/2021    5:32 AM 10/23/2021    2:51 PM  CMP  Glucose 70 - 99 mg/dL 756  433  295   BUN 6 - 20 mg/dL 17  13  12    Creatinine 0.44 - 1.00 mg/dL 1.88  4.16  6.06   Sodium 135 - 145 mmol/L 136  134  137   Potassium 3.5 - 5.1 mmol/L 3.7  4.6  3.6   Chloride 98 - 111 mmol/L 101  105  105   CO2 22 - 32 mmol/L 28  21  24    Calcium 8.9 - 10.3 mg/dL 9.6  8.8  9.6   Total Protein 6.5 - 8.1 g/dL 6.8   7.0   Total Bilirubin <1.2 mg/dL 0.8   1.0   Alkaline Phos 38 -  126 U/L 83   56   AST 15 - 41 U/L 14   35   ALT 0 - 44 U/L 19   43     Assessment & Plan: [name] is a 47 y.o. 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  Patient is overall doing well with no concerning symptoms or exam findings for recurrence. Pap smear will be obtained in March 2025 (last March 2024).   Reportable signs and symptoms reviewed and listed on AVS. We will plan for follow up in three months with a pap smear at that time then plan for 6 month follow up after or sooner if needed. CBC results discussed. Cmet is currently pending and she will be contacted with the results. We will reach out to the office of Lillard Anes with the Breast Oncology team and we will notify her if follow up with the Breast Oncology team is needed. Topical candidiasis treatment prescribed. Pt to notify the office if symptoms do not improve and would like to hold on oral antifungal use at this time.   20 minutes of total time was spent for this patient encounter, including preparation, face-to-face counseling with the patient and coordination of care, and documentation of the encounter.  Warner Mccreedy NP [facility]

## 2023-02-26 NOTE — Telephone Encounter (Signed)
Spoke with [name] and relayed message from Warner Mccreedy, NP that her metabolic panel was normal except for elevated glucose. Pt is working on scheduling an appointment with a PCP. Pt has not had a recent hemoglobin A1C, pt states, "It's been awhile". Advised patient that we will add an Hgb A1C to her most recent blood draw. Pt thanked the office for calling.

## 2023-02-27 ENCOUNTER — Encounter: Payer: Self-pay | Admitting: Adult Health

## 2023-02-27 NOTE — Telephone Encounter (Addendum)
Spoke with [name] this morning and relayed message from Theresa Mccreedy, NP that patient's Hemoglobin A1C is 10.2 which is significantly elevated.   Patient is following up with [facility] at [facility]. [name]. Pt states she last saw Theresa Ramus, [name]. Pt is going to call there Cox today and let them know she needs an appointment asap. Pt agreed to call our Cox back and let us know of the date of her appointment.   Results of Metabolic panel and Hemoglobin W0J faxed to [facility] # [phone] attn: Theresa Ramus, [name].  [name] called the Cox back and she has a scheduled appointment for a full physical with Theresa Ramus, [name] on Friday, January 17 th. Patient was put on a cancellation/waiting list for a possible appointment sooner in the month.

## 2023-02-27 NOTE — Telephone Encounter (Signed)
-----   Message from Doylene Bode sent at 02/27/2023  7:40 AM EST ----- Please let her know her hemoglobin A1C is 10.2 which is significantly elevated. Does she have an office in mind she would like to go to so we can send a referral for a new patient PCP? Want to make sure she gets in soon to get this controlled. ----- Message ----- From: Leory Plowman, [facility] [place] Sent: 02/26/2023   3:02 PM EST To: Doylene Bode, NP

## 2023-04-27 LAB — COLOGUARD: COLOGUARD: NEGATIVE

## 2023-04-27 LAB — EXTERNAL GENERIC LAB PROCEDURE: COLOGUARD: NEGATIVE

## 2023-05-30 ENCOUNTER — Encounter: Payer: Self-pay | Admitting: Gynecologic Oncology

## 2023-06-03 NOTE — Progress Notes (Unsigned)
 Gynecologic Oncology Return Clinic Visit  06/04/2023  Reason for Visit: Pap smear  Treatment History: 10/2013: Pap - normal, HR HPV+.   12/2013: Cervical biopsy and ECC - LSIL.  01/2014: BSO. LEEP - no dysplasia or malignancy, koilocytic changes.  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 09/12/21: TRH for CIN3, fibroids. 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 LVI negative.  10/05/21: PET with no evidence of metastatic disease or residual tumor. 10/31/21: Robotic bilateral pelvic LND, radical parametrectomy and upper vaginectomy.  All pelvic lymph nodes negative for malignancy.  Parametrium negative for malignancy as well as vaginal anterior and posterior margins. 06/12/2022: Pap- negative with high risk HPV not detected  Interval History: Patient reports doing well since her last visit. She has been taking ozempic with minimal side effects. She has lowered her A1C from 10.2 to 5.8 and has lost weight. She reports feeling much better. Tolerating diet with no emesis. Has had appetite and cravings decrease while on ozempic. Had some bladder twinges after recent intercourse that resolved. Denies dysuria or hematuria at this time. No vaginal bleeding or discharge reported. Bowels functioning without difficulty. No pain reported. Cologuard in Jan 2025 was negative. No current lower extremity edema reported. No symptoms voiced concerning for recurrence.  Past Medical/Surgical History: Past Medical History:  Diagnosis Date   Abnormal Pap smear of cervix    06-09-21 HGSIL HPV HR +, 16+, 18/45 neg   Anxiety    Breast cancer (HCC) 10/16/2012   left, lower medial, 8 o'clock   Cervical cancer (HCC) 08/2021   Diabetes mellitus without complication (HCC)    Type 2, Pt takes Metformin.   DVT (deep venous thrombosis) (HCC) 02/2013   RT  LEG , during breast cancer treatment   Family history of anesthesia complication    pt's mother has hx. of post-op N/V   History of kidney stones    Incidental finding on PET Scan   History of radiation therapy 07/06/13-08/20/13   left breast/   History of transfusion    x2 during chemotherapy   Hypertension    under control with med., has been on med. since 09/2012   Pain in the wrist    BILATERAL   Peripheral neuropathy    numbness in toes due to chemotherapy   Personal history of chemotherapy 2014   Red devil & 5FU, 8 sessions and Taxol 12 sessions   Personal history of radiation therapy 2014   07/06/13 - 08/20/13   Status post chemotherapy    Patient completed 7 years of Letrozole in 08/2020.   Urinary incontinence in female    follows with Urology    Past Surgical History:  Procedure Laterality Date   ABDOMINAL HYSTERECTOMY  09/12/2021   BREAST BIOPSY Right 11/2017   benign   BREAST LUMPECTOMY Left 2014   BREAST LUMPECTOMY WITH NEEDLE LOCALIZATION AND AXILLARY SENTINEL LYMPH NODE BX Left 12/10/2012   Procedure: BREAST LUMPECTOMY WITH NEEDLE LOCALIZATION AND AXILLARY SENTINEL LYMPH NODE BX;  Surgeon: Robyne Askew, MD;  Location: [facility];  Service: General;  Laterality: Left;  Needle loc BCG 7:30  nuc med 9:30     CESAREAN SECTION  1999   CHOLECYSTECTOMY  1999   COLPOSCOPY  07/13/2021   CYSTOSCOPY N/A 10/31/2021   Procedure: CYSTOSCOPY;  Surgeon: Eugene Garnet  R, MD;  Location: [facility];  Service: Gynecology;  Laterality: N/A;   LEEP N/A 01/26/2014   Procedure: LOOP ELECTROSURGICAL EXCISION PROCEDURE (LEEP);  Surgeon: Bernita Buffy. Duard Brady, MD;  Location: [facility];  Service: Gynecology;  Laterality: N/A;   OPEN REDUCTION INTERNAL FIXATION (ORIF) TIBIA/FIBULA FRACTURE Right 2013   PORT-A-CATH REMOVAL  2015   PORTACATH PLACEMENT Right 01/09/2013   Procedure: INSERTION PORT-A-CATH;  Surgeon: Robyne Askew, MD;  Location: [facility];  Service: General;  Laterality:  Right;   ROBOTIC ASSISTED BILATERAL SALPINGO OOPHERECTOMY Bilateral 01/26/2014   Procedure: ROBOTIC ASSISTED BILATERAL SALPINGO OOPHORECTOMY;  Surgeon: Bernita Buffy. Duard Brady, MD;  Location: [facility];  Service: Gynecology;  Laterality: Bilateral;   ROBOTIC ASSISTED LAPAROSCOPIC HYSTERECTOMY AND SALPINGECTOMY Bilateral 09/12/2021   Procedure: XI ROBOTIC ASSISTED LAPAROSCOPIC HYSTERECTOMY;  Surgeon: Genia Del, MD;  Location: [facility] [place];  Service: Gynecology;  Laterality: Bilateral;   TONSILLECTOMY AND ADENOIDECTOMY     as a teenager    Family History  Problem Relation Age of Onset   Anesthesia problems Mother        post-op N/V   Breast cancer Maternal Grandmother        50-60s   Lung cancer Paternal Grandmother    Colon cancer Neg Hx    Ovarian cancer Neg Hx    Endometrial cancer Neg Hx    Pancreatic cancer Neg Hx    Prostate cancer Neg Hx     Social History   Socioeconomic History   Marital status: Divorced    Spouse name: Not on file   Number of children: Not on file   Years of education: Not on file   Highest education level: Not on file  Occupational History   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  Vaping Use   Vaping status: Never Used  Substance and Sexual Activity   Alcohol use: Yes    Comment: rarely, less than once a month   Drug use: No   Sexual activity: Not Currently    Partners: Male    Birth control/protection: Surgical    Comment: menarche age 52, P76 @ age 49,  hysterectomy  Other Topics Concern   Not on file  Social History Narrative   Not on file   Social Drivers of Health   Financial Resource Strain:  Not on file  Food Insecurity: Not on file  Transportation Needs: Not on file  Physical Activity: Not on file  Stress: Not on file  Social Connections: Not on file    Current Medications:  Current Outpatient Medications:    b complex vitamins tablet, Take 1 tablet by mouth every morning., Disp: , Rfl:    BIOTIN PO, Take 1 tablet by mouth every morning., Disp: , Rfl:    Cholecalciferol 2000 units CAPS, Take 2,000 Units by mouth daily., Disp: , Rfl:    lisinopril (ZESTRIL) 20 MG tablet, Take 20 mg by mouth daily., Disp: , Rfl:    Multiple Vitamins-Minerals (ICAPS AREDS 2 PO), Take 1 capsule by mouth daily., Disp: , Rfl:    Semaglutide, 2 MG/DOSE, (OZEMPIC, 2  MG/DOSE,) 8 MG/3ML SOPN, Inject into the skin. Once monthly, Disp: , Rfl:    zinc sulfate, 50mg  elemental zinc, 220 (50 Zn) MG capsule, Take 220 mg by mouth daily., Disp: , Rfl:   Review of Systems: See interval. Additional review negative.  Physical Exam: LMP 12/28/2012 Comment: salpingo oophorectomy General: Well developed, well nourished female in no acute distress. Alert and oriented x 3.  Neck: Supple without any enlargements.  Lymph node survey: No cervical, supraclavicular, or inguinal adenopathy.  Cardiovascular: Regular rate and rhythm. S1 and S2 normal.  Lungs: Clear to auscultation bilaterally. No wheezes/crackles/rhonchi noted.  Skin: No rashes present.  Abdomen: Abdomen soft, non-tender. Active bowel sounds in all quadrants. No evidence of a fluid wave or abdominal masses.  Genitourinary:    Vulva/vagina: Normal external female genitalia. No lesions.    Urethra: No lesions or masses    Vagina: Mildly atrophic without any lesions. No palpable masses. No vaginal bleeding or drainage noted. Bimanual confirms. Thin prep pap smear obtained without difficulty.  Rectal: deferred, performed at 02/2023 visit Extremities: No bilateral cyanosis, edema, or clubbing.   Laboratory & Radiologic Studies: A. LYMPH NODE, LEFT  EXTERNAL ILIAC, EXCISION:  Hyperplastic lymph node with stromal hyalinization and  microcalcification. Negative for metastatic carcinoma and lymphoproliferative disorder.   B. PERINEAL, RIGHT AT 0700, BIOPSY:  Benign epithelial inclusion in the dermis. Negative for dysplasia and malignancy   C. LYMPH NODES, BILATERAL PELVIC, RESECTION:  Eight (8) lymph nodes some with stromal hyalinization and  microcalcification and some with fatty infiltration. All are negative for metastatic carcinoma.   D. PERIMETRIUM, RIGHT, AND UPPER VAGINA, RESECTION:  Portions of fibroconnective tissue and vasculature with organized thrombi in some blood vessels. Carcinoma is not identified in any of the sections examined.   E. VAGINAL MARGIN, ANTERIOR, EXCISION:  Portions of fibroconnective tissue and granulation tissue with organizing thrombi and some blood vessels. Normal squamous epithelium is not identified. Carcinoma is not identified in the specimen.   F. VAGINA PARAMETRIUM, LEFT, EXCISION:  Portions of fibroconnective tissue and granulation tissue with organizing thrombi in some blood vessels. Normal squamous epithelium is not identified. Carcinoma is not identified in any of the sections examined.   G. VAGINA, POSTERIOR, EXCISION:  Normal vaginal mucosa and adjacent granulation tissue. Carcinoma is not identified in the specimen.      Latest Ref Rng & Units 02/26/2023    1:59 PM 11/01/2021    5:32 AM 10/23/2021    2:51 PM  CBC  WBC 4.0 - 10.5 K/uL 9.2  15.8  10.0   Hemoglobin 12.0 - 15.0 g/dL 40.9  81.1  91.4   Hematocrit 36.0 - 46.0 % 42.2  40.5  43.2   Platelets 150 - 400 K/uL 240  237  265        Latest Ref Rng & Units 02/26/2023    1:59 PM 11/01/2021    5:32 AM 10/23/2021    2:51 PM  CMP  Glucose 70 - 99 mg/dL 782  956  213   BUN 6 - 20 mg/dL 17  13  12    Creatinine 0.44 - 1.00 mg/dL 0.86  5.78  4.69   Sodium 135 - 145 mmol/L 136  134  137   Potassium 3.5 - 5.1 mmol/L 3.7  4.6  3.6   Chloride  98 - 111 mmol/L 101  105  105   CO2 22 - 32 mmol/L 28  21  24    Calcium 8.9 - 10.3 mg/dL 9.6  8.8  9.6   Total Protein 6.5 - 8.1 g/dL 6.8   7.0   Total Bilirubin <1.2 mg/dL 0.8   1.0   Alkaline Phos 38 - 126 U/L 83   56   AST 15 - 41 U/L 14   35   ALT 0 - 44 U/L 19   43     Cologuard on 04/18/2023: negative HgbA1C on 02/26/2023: 10.2. Recently checked and down to 5.8  Assessment & Plan: [name] is a 48 y.o. female with Stage IB1 SCC of the cervix (no LVI, negative LNs) who presents for follow up with a pap smear today.  Patient is overall doing well with no concerning symptoms or exam findings for recurrence. Pap smear (cotesting) obtained today and she will be contacted with the results.   Reportable signs and symptoms reviewed and listed on AVS. We will plan for follow up in 6 months with Dr. Pricilla Holm or sooner if needed. We will reach out to the office of Lillard Anes NP with the Breast Oncology team and arrange for a follow up appointment with her office on the same day per pt request.   20 minutes of total time was spent for this patient encounter, including preparation, face-to-face counseling with the patient and coordination of care, and documentation of the encounter.  Warner Mccreedy NP [facility] Oncology

## 2023-06-04 ENCOUNTER — Inpatient Hospital Stay: Payer: BC Managed Care – PPO | Attending: Gynecologic Oncology | Admitting: Gynecologic Oncology

## 2023-06-04 VITALS — BP 131/87 | HR 94 | Temp 98.7°F | Resp 16 | Ht 65.0 in | Wt 228.0 lb

## 2023-06-04 DIAGNOSIS — Z9079 Acquired absence of other genital organ(s): Secondary | ICD-10-CM | POA: Diagnosis not present

## 2023-06-04 DIAGNOSIS — Z90722 Acquired absence of ovaries, bilateral: Secondary | ICD-10-CM | POA: Diagnosis not present

## 2023-06-04 DIAGNOSIS — Z8541 Personal history of malignant neoplasm of cervix uteri: Secondary | ICD-10-CM | POA: Insufficient documentation

## 2023-06-04 DIAGNOSIS — Z90710 Acquired absence of both cervix and uterus: Secondary | ICD-10-CM | POA: Insufficient documentation

## 2023-06-04 DIAGNOSIS — C539 Malignant neoplasm of cervix uteri, unspecified: Secondary | ICD-10-CM

## 2023-06-04 NOTE — Patient Instructions (Addendum)
 No concerning findings on today's examination. We will contact you with the results of your pap smear from today.   Plan to follow up in six months with Dr. Pricilla Holm or sooner if needed. Please call our office in July or August 2025 at [phone] to schedule an appointment with Dr. Pricilla Holm for September 2025.   Symptoms to report to your health care team include vaginal bleeding, rectal bleeding, bloating, weight loss without effort, new and persistent pain, new and  persistent fatigue, new leg swelling, new masses (i.e., bumps in your neck or groin), new and persistent cough, new and persistent nausea and vomiting, change in bowel or bladder habits, and any other concerns.  We had reached out to Lillard Anes NP with the Breast side here at the [facility] and she said she would be happy to see you for follow up. We will work on getting this arranged the same day you see Dr. Pricilla Holm in September.

## 2023-06-05 ENCOUNTER — Telehealth: Payer: Self-pay | Admitting: Adult Health

## 2023-06-05 NOTE — Telephone Encounter (Signed)
 Scheduled appointment per 3/19 staff message. Talked with the patient and she is aware of the made appointment.

## 2023-06-11 ENCOUNTER — Encounter: Payer: Self-pay | Admitting: Gynecologic Oncology

## 2023-06-11 LAB — CYTOLOGY - PAP
Comment: NEGATIVE
Diagnosis: NEGATIVE
High risk HPV: NEGATIVE

## 2023-08-05 IMAGING — MG MM DIGITAL SCREENING BILAT W/ TOMO AND CAD
8 series · 8 of 24 positions shown · non-contrast
Comparison: Previous exam(s).

CLINICAL DATA: Screening. History of LEFT breast cancer and
lumpectomy in 7797.

EXAM:
DIGITAL SCREENING BILATERAL MAMMOGRAM WITH TOMOSYNTHESIS AND CAD
TECHNIQUE: Bilateral screening digital craniocaudal and mediolateral oblique
mammograms were obtained. Bilateral screening digital breast
tomosynthesis was performed. The images were evaluated with
computer-aided detection.

[L MLO synth-2D]
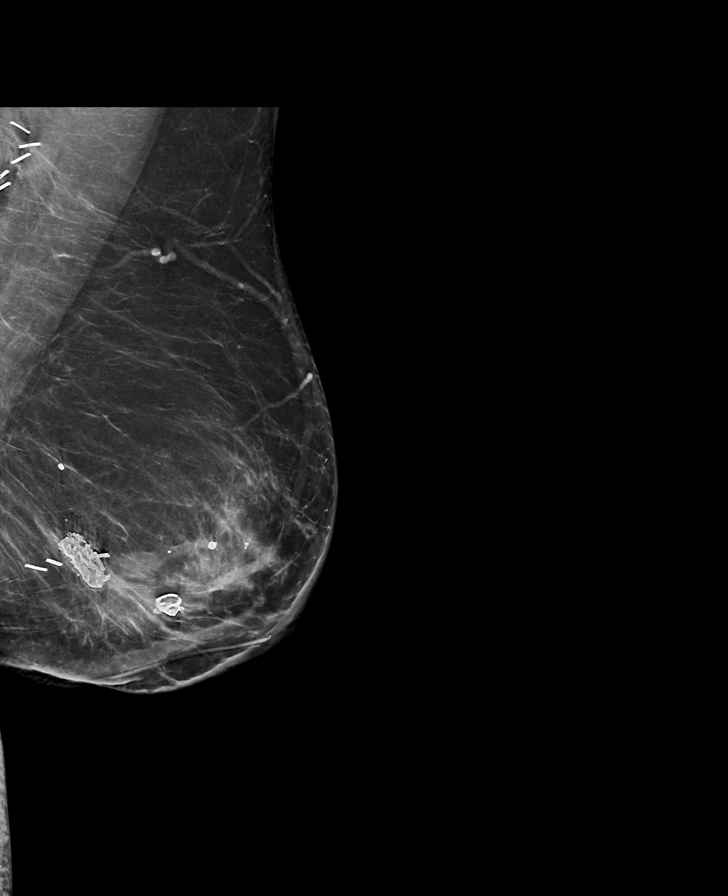

[L CC synth-2D]
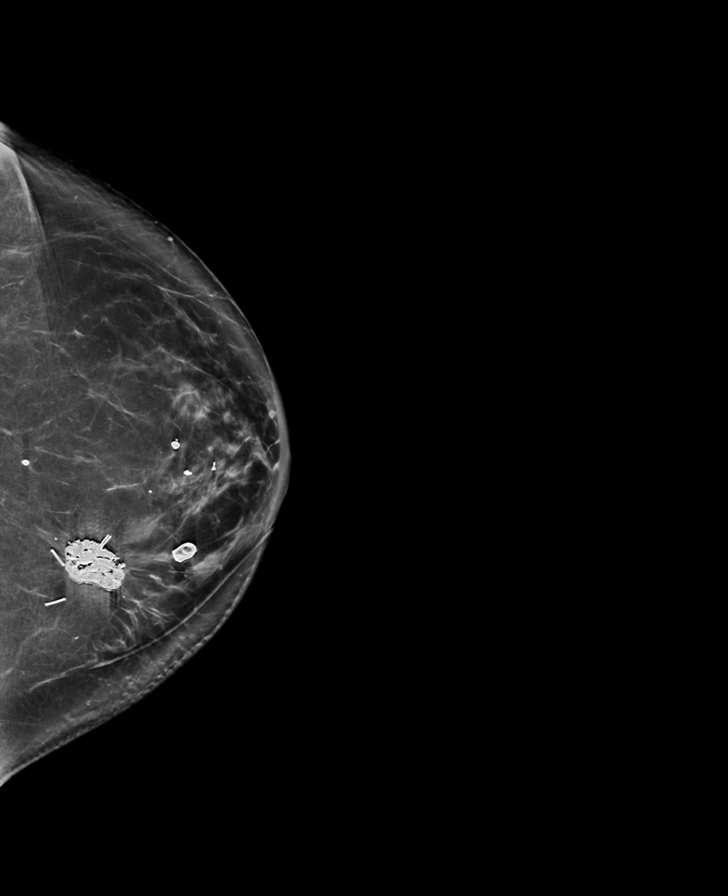

[R MLO synth-2D]
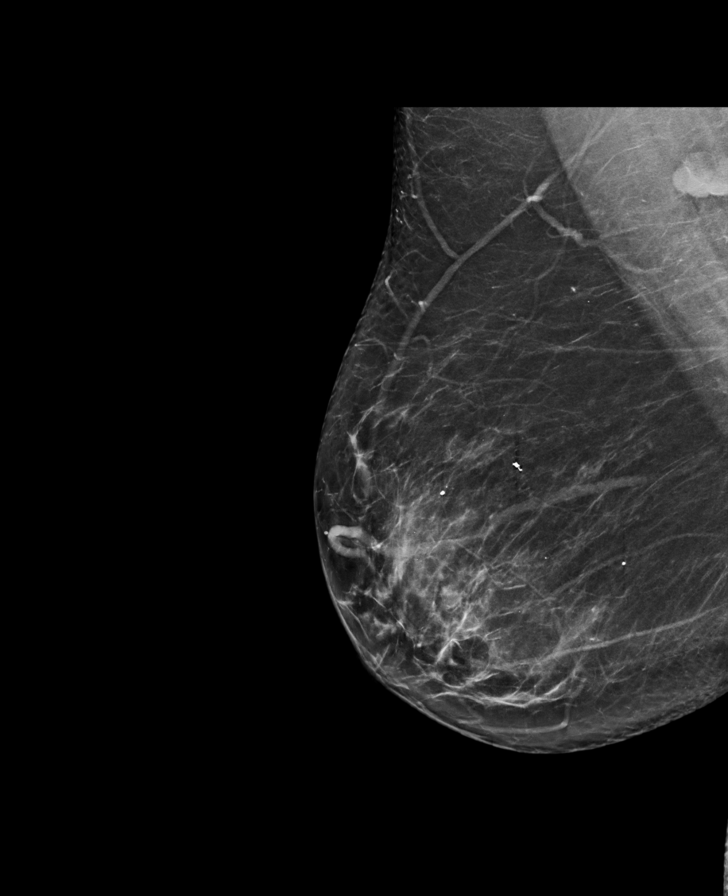

[R CC synth-2D]
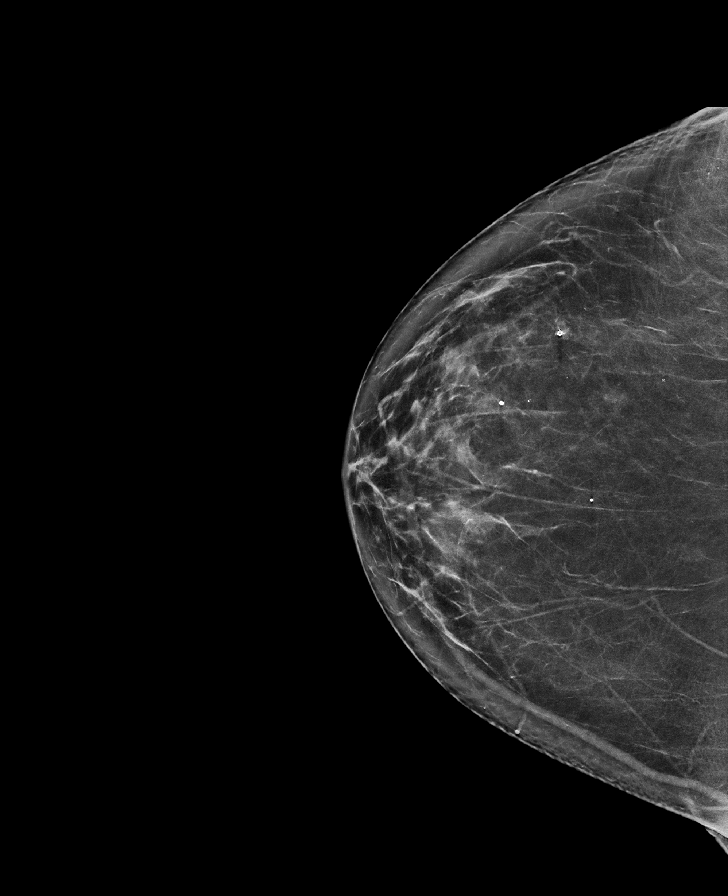

[L MLO tomo · tomo slice 47/93.0]
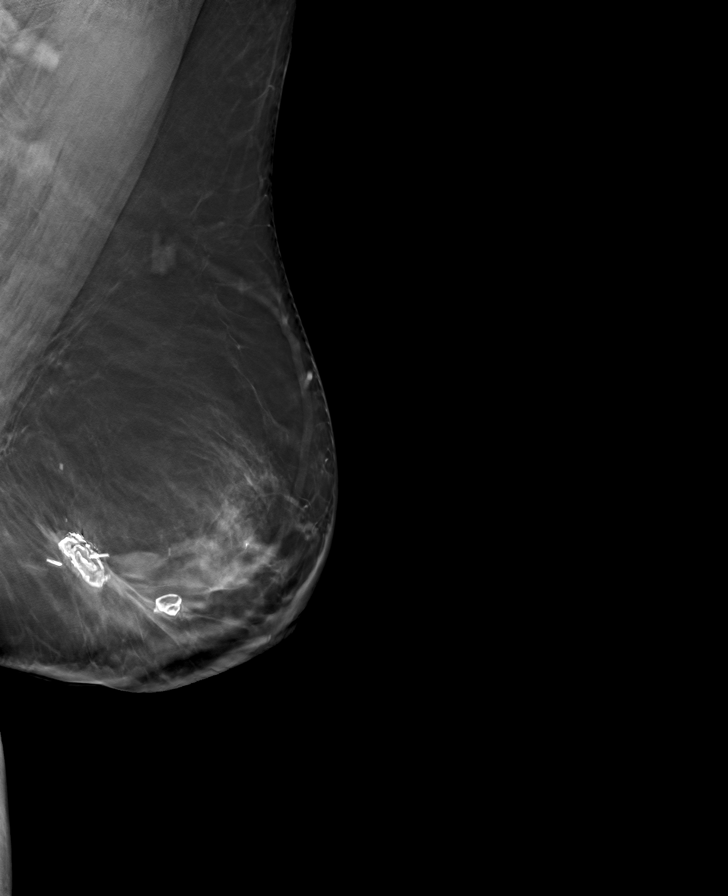

[R MLO tomo · tomo slice 43/85.0]
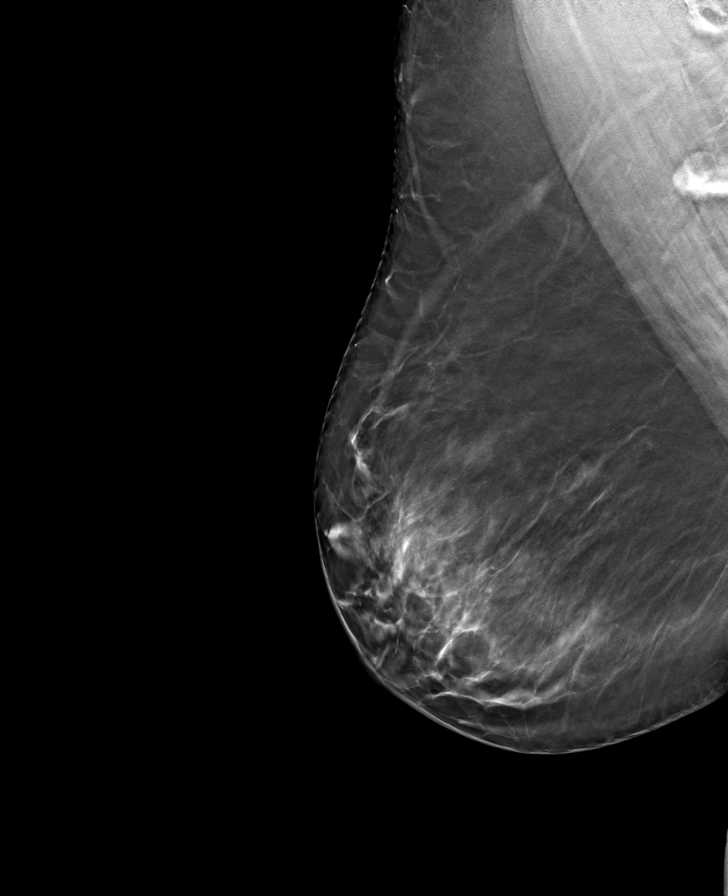

[R CC tomo · tomo slice 39/78.0]
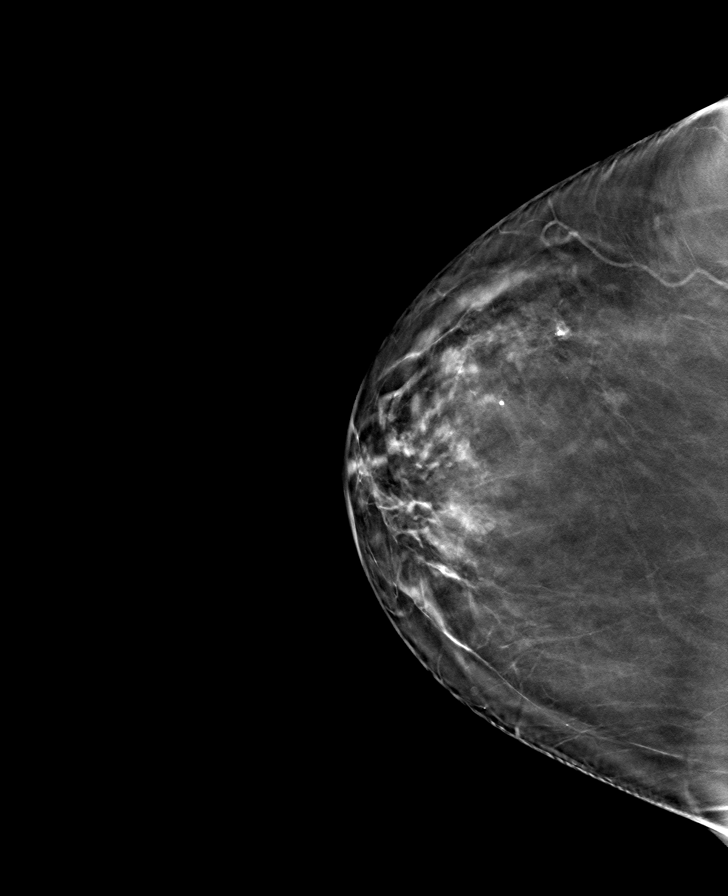

[L CC tomo · tomo slice 44/87.0]
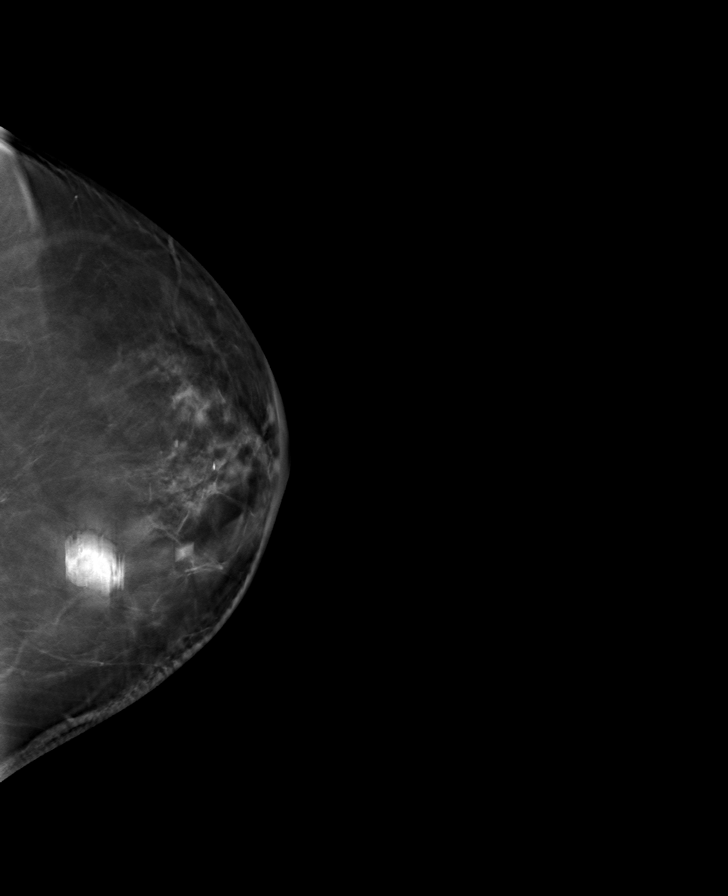

[8 of 24 positions shown; findings below may reference images not displayed]

ACR Breast Density Category b: There are scattered areas of
fibroglandular density.
FINDINGS: There are no findings suspicious for malignancy. LEFT breast and
axillary surgical changes are again noted.
IMPRESSION: No mammographic evidence of malignancy. A result letter of this
screening mammogram will be mailed directly to the patient.

RECOMMENDATION:
Screening mammogram in one year. (Code:RL-O-SVP)

BI-RADS CATEGORY  2: Benign.

## 2023-12-04 ENCOUNTER — Encounter: Payer: Self-pay | Admitting: Gynecologic Oncology

## 2023-12-06 ENCOUNTER — Inpatient Hospital Stay: Attending: Gynecologic Oncology | Admitting: Gynecologic Oncology

## 2023-12-06 ENCOUNTER — Inpatient Hospital Stay: Admitting: Adult Health

## 2023-12-06 ENCOUNTER — Encounter: Payer: Self-pay | Admitting: Gynecologic Oncology

## 2023-12-06 ENCOUNTER — Encounter: Payer: Self-pay | Admitting: Adult Health

## 2023-12-06 VITALS — BP 128/72 | HR 98 | Temp 98.3°F | Resp 18 | Wt 229.0 lb

## 2023-12-06 VITALS — BP 138/100 | HR 98 | Temp 98.3°F | Resp 18 | Ht 67.0 in | Wt 229.6 lb

## 2023-12-06 DIAGNOSIS — Z1231 Encounter for screening mammogram for malignant neoplasm of breast: Secondary | ICD-10-CM | POA: Diagnosis not present

## 2023-12-06 DIAGNOSIS — Z9221 Personal history of antineoplastic chemotherapy: Secondary | ICD-10-CM | POA: Diagnosis not present

## 2023-12-06 DIAGNOSIS — Z90710 Acquired absence of both cervix and uterus: Secondary | ICD-10-CM | POA: Diagnosis not present

## 2023-12-06 DIAGNOSIS — Z853 Personal history of malignant neoplasm of breast: Secondary | ICD-10-CM | POA: Insufficient documentation

## 2023-12-06 DIAGNOSIS — Z90722 Acquired absence of ovaries, bilateral: Secondary | ICD-10-CM | POA: Diagnosis not present

## 2023-12-06 DIAGNOSIS — Z08 Encounter for follow-up examination after completed treatment for malignant neoplasm: Secondary | ICD-10-CM | POA: Diagnosis not present

## 2023-12-06 DIAGNOSIS — Z923 Personal history of irradiation: Secondary | ICD-10-CM | POA: Insufficient documentation

## 2023-12-06 DIAGNOSIS — Z8541 Personal history of malignant neoplasm of cervix uteri: Secondary | ICD-10-CM | POA: Diagnosis present

## 2023-12-06 DIAGNOSIS — C539 Malignant neoplasm of cervix uteri, unspecified: Secondary | ICD-10-CM

## 2023-12-06 DIAGNOSIS — C50312 Malignant neoplasm of lower-inner quadrant of left female breast: Secondary | ICD-10-CM

## 2023-12-06 DIAGNOSIS — Z9079 Acquired absence of other genital organ(s): Secondary | ICD-10-CM | POA: Insufficient documentation

## 2023-12-06 NOTE — Progress Notes (Signed)
 [facility] Cancer Follow up:    Con Rake, FNP [address] [place] [address]   DIAGNOSIS: Cancer Staging  Primary cancer of lower-inner quadrant of left female breast [facility]) Staging form: Breast, AJCC 7th Edition - Clinical: Stage IA (T1c, N0, cM0) - Unsigned Specimen type: Core Needle Biopsy Histopathologic type: 9931 Laterality: Left Staging comments: Staged at breast conference 10/22/12  - Pathologic: Stage IIB (T2, N64mi, cM0) - Signed by Jason Lamar PARAS, MD on 06/10/2013 Specimen type: Core Needle Biopsy Histopathologic type: 9931 Laterality: Left    SUMMARY OF ONCOLOGIC HISTORY: 45 yBRCA negative [place], [place] woman  status post left breast lower inner quadrant lumpectomy with left axillary sentinel lymph node biopsy 12/10/2012 for a pT2, pN82mi, stage IIB invasive ductal carcinoma, grade 3, 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    #2 s/p adjuvant chemotherapy consisting of FEC (5-FU/epirubicin /Cytoxan ) begun on 01/23/2013,completed on 04/03/13 (six cycles).    #3 S/p adjuvant weekly Taxol  beginning 04/17/13, received  7 of the 12 planned cycles, last dose 06/05/13 (stopped secondary to skin toxicity)   #6 adjuvant radiation to the left breast and regional lymph nodes completed 08/20/2013   #7 goserelin started 04/17/2013, continued through OCT 2015             (a) s/p BSO 01/26/2014 with benign pathology   #8 anastrozole  started 08/14/2013; bone density 08/19/2013 was normal             (a) stopped 04/15/14 because of severe joint stiffness. Switched to letrozole  starting 05/03/14             (b) DEXA scan 08/22/2015 showed a T score of -1.7             (c) given history of DVT we are not considering switching to tamoxifen.             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 Repeat venous Doppler performed in March 2015 was negative for any thrombosis.  Xarelto  was stopped Aug 14, 2012.    CURRENT THERAPY:  INTERVAL HISTORY: Discussed the use of AI scribe software for clinical note transcription with the patient, who gave verbal consent to proceed.  History of Present Illness [name] is a 48 year old female with stage 2B invasive ductal carcinoma who presents for follow-up of breast cancer.  She was diagnosed with stage 2B invasive ductal carcinoma, ER, PR positive, HER2 negative, in 2014. Treatment included lumpectomy, adjuvant chemotherapy, adjuvant radiation, Zoladex , letrozole , and Prolia . She completed seven years of anastrozole  therapy. Her most recent mammogram in May 2024 showed left breast asymmetry, which was resolved with further imaging. The right breast was benign, and she has breast density category B.  Her blood pressure and A1c are currently managed, with an A1c of 5.3. She is on Ozempic 0.5 mg, experiencing initial nausea but no significant constipation.     Patient Active Problem List   Diagnosis Date Noted   Cervical cancer (HCC) 10/31/2021   Malignant neoplasm of cervix (HCC) 09/25/2021   Postoperative state 09/12/2021   Type 2 diabetes mellitus without complication, without long-term current use of  insulin  (HCC) 10/08/2018   Mixed hyperlipidemia 10/08/2018   Osteoporosis 01/13/2018   Uncontrolled type 2 diabetes mellitus with hyperglycemia (HCC) 02/28/2017   Essential hypertension, benign 02/28/2017   Class 2 obesity due to excess calories without serious comorbidity with body mass index (BMI) of 35.0 to 35.9 in adult 02/28/2017   Hot flashes 07/15/2014   Primary cancer of lower-inner quadrant of left female breast (HCC) 10/20/2013   Tachycardia 07/02/2013   Cervical  high risk HPV (human papillomavirus) test positive 10/12/2012    is allergic to influenza virus vaccine and amoxicillin.  MEDICAL HISTORY: Past Medical History:  Diagnosis Date   Abnormal Pap smear of cervix    06-09-21 HGSIL HPV HR +, 16+, 18/45 neg   Anxiety    Breast cancer (HCC) 10/16/2012   left, lower medial, 8 o'clock   Cervical cancer (HCC) 08/2021   Diabetes mellitus without complication (HCC)    Type 2, Pt takes Metformin .   DVT (deep venous thrombosis) (HCC) 02/2013   RT LEG , during breast cancer treatment   Family history of anesthesia complication    pt's mother has hx. of post-op N/V   History of kidney stones    Incidental finding on PET Scan   History of radiation therapy 07/06/13-08/20/13   left breast/   History of transfusion    x2 during chemotherapy   Hypertension    under control with med., has been on med. since 09/2012   Pain in the wrist    BILATERAL   Peripheral neuropathy    numbness in toes due to chemotherapy   Personal history of chemotherapy 2014   Red devil & 5FU, 8 sessions and Taxol  12 sessions   Personal history of radiation therapy 2014   07/06/13 - 08/20/13   Status post chemotherapy    Patient completed 7 years of Letrozole  in 08/2020.   Urinary incontinence in female    follows with Urology    SURGICAL HISTORY: Past Surgical History:  Procedure Laterality Date   ABDOMINAL HYSTERECTOMY  09/12/2021   BREAST BIOPSY Right 11/2017   benign   BREAST LUMPECTOMY Left 2014   BREAST LUMPECTOMY WITH NEEDLE LOCALIZATION AND AXILLARY SENTINEL LYMPH NODE BX Left 12/10/2012   Procedure: BREAST LUMPECTOMY WITH NEEDLE LOCALIZATION AND AXILLARY SENTINEL LYMPH NODE BX;  Surgeon: Deward GORMAN Curvin DOUGLAS, MD;  Location: [facility];  Service: General;  Laterality: Left;  Needle loc BCG 7:30  nuc med 9:30     CESAREAN SECTION  1999   CHOLECYSTECTOMY  1999   COLPOSCOPY  07/13/2021   CYSTOSCOPY N/A 10/31/2021   Procedure: CYSTOSCOPY;  Surgeon: Viktoria Comer SAUNDERS, MD;   Location: [facility];  Service: Gynecology;  Laterality: N/A;   LEEP N/A 01/26/2014   Procedure: LOOP ELECTROSURGICAL EXCISION PROCEDURE (LEEP);  Surgeon: Elenore LABOR. Dodie, MD;  Location: [facility];  Service: Gynecology;  Laterality: N/A;   OPEN REDUCTION INTERNAL FIXATION (ORIF) TIBIA/FIBULA FRACTURE Right 2013   PORT-A-CATH REMOVAL  2015   PORTACATH PLACEMENT Right 01/09/2013   Procedure: INSERTION PORT-A-CATH;  Surgeon: Deward GORMAN Curvin DOUGLAS, MD;  Location: [facility];  Service: General;  Laterality: Right;   ROBOTIC ASSISTED BILATERAL SALPINGO OOPHERECTOMY Bilateral 01/26/2014   Procedure: ROBOTIC ASSISTED BILATERAL SALPINGO OOPHORECTOMY;  Surgeon: Elenore LABOR. Dodie, MD;  Location: [facility];  Service: Gynecology;  Laterality: Bilateral;   ROBOTIC ASSISTED LAPAROSCOPIC HYSTERECTOMY AND SALPINGECTOMY Bilateral 09/12/2021   Procedure: XI ROBOTIC ASSISTED LAPAROSCOPIC HYSTERECTOMY;  Surgeon: Lavoie, Marie-Lyne, MD;  Location: [facility];  Service: Gynecology;  Laterality: Bilateral;   TONSILLECTOMY AND ADENOIDECTOMY     as a teenager    SOCIAL HISTORY: Social History   Socioeconomic History   Marital status: Divorced    Spouse name: Not on file   Number of children: Not on file   Years of education: Not on file   Highest education level: Not on file  Occupational History   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  Vaping Use   Vaping status: Never Used  Substance and Sexual Activity   Alcohol use: Yes    Comment: rarely, less than once a month   Drug use: No   Sexual activity: Not Currently    Partners: Male    Birth control/protection:  Surgical    Comment: menarche age 36, P52 @ age 68,  hysterectomy  Other Topics Concern   Not on file  Social History Narrative   Not on file   Social Drivers of Health   Financial Resource Strain: Not on file  Food Insecurity: Not on file  Transportation Needs: Not on file  Physical Activity: Not on file  Stress: Not on file  Social Connections: Not on file  Intimate Partner Violence: Not on file    FAMILY HISTORY: Family History  Problem Relation Age of Onset   Anesthesia problems Mother        post-op N/V   Breast cancer Maternal Grandmother        50-60s   Lung cancer Paternal Grandmother    Colon cancer Neg Hx    Ovarian cancer Neg Hx    Endometrial cancer Neg Hx    Pancreatic cancer Neg Hx    Prostate cancer Neg Hx     Review of Systems  Constitutional:  Negative for appetite change, chills, fatigue, fever and unexpected weight change.  HENT:   Negative for hearing loss, lump/mass and trouble swallowing.   Eyes:  Negative for eye problems and icterus.  Respiratory:  Negative for chest tightness, cough and shortness of breath.   Cardiovascular:  Negative for chest pain, leg swelling and palpitations.  Gastrointestinal:  Negative for abdominal distention, abdominal pain, constipation, diarrhea, nausea and vomiting.  Endocrine: Negative for hot flashes.  Genitourinary:  Negative for difficulty urinating.   Musculoskeletal:  Negative for arthralgias.  Skin:  Negative for itching and rash.  Neurological:  Negative for dizziness, extremity weakness, headaches and numbness.  Hematological:  Negative for adenopathy. Does not bruise/bleed easily.  Psychiatric/Behavioral:  Negative for depression. The patient is not nervous/anxious.       PHYSICAL EXAMINATION    Vitals:   12/06/23 1251  BP: (!) 138/100  Pulse: 98  Resp: 18  Temp: 98.3 F (36.8 C)  SpO2: 100%    Physical Exam Constitutional:      General: She is not in acute distress.    Appearance: Normal  appearance. She is not toxic-appearing.  HENT:     Head: Normocephalic and atraumatic.     Mouth/Throat:     Mouth: Mucous membranes are moist.     Pharynx: Oropharynx is clear. No oropharyngeal  exudate or posterior oropharyngeal erythema.  Eyes:     General: No scleral icterus. Cardiovascular:     Rate and Rhythm: Normal rate and regular rhythm.     Pulses: Normal pulses.     Heart sounds: Normal heart sounds.  Pulmonary:     Effort: Pulmonary effort is normal.     Breath sounds: Normal breath sounds.  Chest:     Comments: Left breast s/p lumpectomy and radiation, no sign of local recurrence, right breast benign Abdominal:     General: Abdomen is flat. Bowel sounds are normal. There is no distension.     Palpations: Abdomen is soft.     Tenderness: There is no abdominal tenderness.  Musculoskeletal:        General: No swelling.     Cervical back: Neck supple.  Lymphadenopathy:     Cervical: No cervical adenopathy.     Upper Body:     Right upper body: No supraclavicular or axillary adenopathy.     Left upper body: No supraclavicular or axillary adenopathy.  Skin:    General: Skin is warm and dry.     Findings: No rash.  Neurological:     General: No focal deficit present.     Mental Status: She is alert.  Psychiatric:        Mood and Affect: Mood normal.        Behavior: Behavior normal.    ASSESSMENT and THERAPY PLAN:   Assessment and Plan Assessment & Plan Stage 2B invasive ductal carcinoma of the left breast, ER, PR positive, HER2 negative No current signs of recurrence. Cleared left breast asymmetry on recent imaging. - Order mammogram. - Encourage healthy diet and exercise, aiming for 150 minutes of exercise per week. - Advise intake of five servings of fruits and vegetables daily. - Continue follow-up with Dr. Viktoria.  Type 2 diabetes mellitus A1c at 5.3, indicating good glycemic control. Tolerating Ozempic 0.5 mg well. - Continue Ozempic 0.5 mg. -  Maintain current management plan.  Essential hypertension Blood pressure improved to 138/100 mmHg. Lifestyle modifications emphasized. - Encourage healthy diet and exercise. - Set goal of 150 minutes of exercise per week by 2026.    All questions were answered. The patient knows to call the clinic with any problems, questions or concerns. We can certainly see the patient much sooner if necessary.  Total encounter time:20 minutes*in face-to-face visit time, chart review, lab review, care coordination, order entry, and documentation of the encounter time.    Morna Kendall, NP 12/06/23 1:14 PM Medical Oncology and Hematology [facility] [address] [place], [place] [address] Tel. [phone]    Fax. [phone]  *Total Encounter Time as defined by the Centers for Medicare and Medicaid Services includes, in addition to the face-to-face time of a patient visit (documented in the note above) non-face-to-face time: obtaining and reviewing outside history, ordering and reviewing medications, tests or procedures, care coordination (communications with other health care professionals or caregivers) and documentation in the medical record.

## 2023-12-06 NOTE — Progress Notes (Signed)
 Gynecologic Oncology Return Clinic Visit  12/06/23  Reason for Visit: follow-up  Treatment History: 10/2013: Pap - normal, HR HPV+.   12/2013: Cervical biopsy and ECC - LSIL.  01/2014: BSO. LEEP - no dysplasia or malignancy, koilocytic changes.  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 09/12/21: TRH for CIN3, fibroids. 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 LVI negative.  10/05/21: PET with no evidence of metastatic disease or residual tumor. 10/31/21: Robotic bilateral pelvic LND, radical parametrectomy and upper vaginectomy.  All pelvic lymph nodes negative for malignancy.  Parametrium negative for malignancy as well as vaginal anterior and posterior margins. 06/12/2022: Pap- negative with high risk HPV not detected  Interval History: Doing well.  Denies any vaginal bleeding or pelvic pain.  Reports baseline bowel and bladder function.  Past Medical/Surgical History: Past Medical History:  Diagnosis Date   Abnormal Pap smear of cervix    06-09-21 HGSIL HPV HR +, 16+, 18/45 neg   Anxiety    Breast cancer (HCC) 10/16/2012   left, lower medial, 8 o'clock   Cervical cancer (HCC) 08/2021   Diabetes mellitus without complication (HCC)    Type 2, Pt takes Metformin .   DVT (deep venous thrombosis) (HCC) 02/2013   RT LEG , during breast cancer treatment   Family history of anesthesia complication    pt's mother has hx. of post-op N/V   History of kidney stones    Incidental finding on PET Scan   History of radiation therapy 07/06/13-08/20/13   left breast/   History of transfusion    x2 during chemotherapy   Hypertension    under control with med., has been on med. since 09/2012   Pain in the wrist    BILATERAL   Peripheral neuropathy    numbness in toes due to chemotherapy   Personal history of chemotherapy 2014    Red devil & 5FU, 8 sessions and Taxol  12 sessions   Personal history of radiation therapy 2014   07/06/13 - 08/20/13   Status post chemotherapy    Patient completed 7 years of Letrozole  in 08/2020.   Urinary incontinence in female    follows with Urology    Past Surgical History:  Procedure Laterality Date   ABDOMINAL HYSTERECTOMY  09/12/2021   BREAST BIOPSY Right 11/2017   benign   BREAST LUMPECTOMY Left 2014   BREAST LUMPECTOMY WITH NEEDLE LOCALIZATION AND AXILLARY SENTINEL LYMPH NODE BX Left 12/10/2012   Procedure: BREAST LUMPECTOMY WITH NEEDLE LOCALIZATION AND AXILLARY SENTINEL LYMPH NODE BX;  Surgeon: Deward GORMAN Curvin DOUGLAS, MD;  Location: [facility];  Service: General;  Laterality: Left;  Needle loc BCG 7:30  nuc med 9:30     CESAREAN SECTION  1999   CHOLECYSTECTOMY  1999   COLPOSCOPY  07/13/2021   CYSTOSCOPY N/A 10/31/2021   Procedure: CYSTOSCOPY;  Surgeon: Viktoria Comer SAUNDERS, MD;  Location: [facility];  Service: Gynecology;  Laterality: N/A;   LEEP N/A 01/26/2014   Procedure: LOOP ELECTROSURGICAL EXCISION PROCEDURE (LEEP);  Surgeon: Elenore LABOR. Dodie, MD;  Location: [facility];  Service: Gynecology;  Laterality: N/A;   OPEN REDUCTION INTERNAL FIXATION (ORIF) TIBIA/FIBULA FRACTURE Right 2013   PORT-A-CATH REMOVAL  2015   PORTACATH PLACEMENT Right 01/09/2013   Procedure: INSERTION PORT-A-CATH;  Surgeon: Deward GORMAN Curvin DOUGLAS, MD;  Location: [facility];  Service: General;  Laterality: Right;   ROBOTIC ASSISTED BILATERAL SALPINGO OOPHERECTOMY Bilateral 01/26/2014   Procedure: ROBOTIC ASSISTED BILATERAL SALPINGO OOPHORECTOMY;  Surgeon: Elenore A. Dodie, MD;  Location: [facility];  Service: Gynecology;  Laterality: Bilateral;   ROBOTIC ASSISTED LAPAROSCOPIC HYSTERECTOMY AND SALPINGECTOMY Bilateral 09/12/2021   Procedure: XI ROBOTIC ASSISTED LAPAROSCOPIC HYSTERECTOMY;  Surgeon: Lavoie, Marie-Lyne, MD;  Location: [facility];  Service: Gynecology;  Laterality: Bilateral;   TONSILLECTOMY  AND ADENOIDECTOMY     as a teenager    Family History  Problem Relation Age of Onset   Anesthesia problems Mother        post-op N/V   Breast cancer Maternal Grandmother        50-60s   Lung cancer Paternal Grandmother    Colon cancer Neg Hx    Ovarian cancer Neg Hx    Endometrial cancer Neg Hx    Pancreatic cancer Neg Hx    Prostate cancer Neg Hx     Social History   Socioeconomic History   Marital status: Divorced    Spouse name: Not on file   Number of children: Not on file   Years of education: Not on file   Highest education level: Not on file  Occupational History   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  Vaping Use   Vaping status: Never Used  Substance and Sexual Activity   Alcohol use: Yes    Comment: rarely, less than once a month   Drug use: No   Sexual activity: Not Currently    Partners: Male    Birth control/protection: Surgical    Comment: menarche age 79, P62 @ age 70,  hysterectomy  Other Topics Concern   Not on file  Social History Narrative   Not on file   Social Drivers of Health   Financial Resource Strain: Not on file  Food Insecurity: Not on file  Transportation Needs: Not on file  Physical Activity: Not on file  Stress: Not on file  Social Connections: Not on file    Current Medications:  Current Outpatient Medications:    b complex vitamins tablet, Take 1 tablet by mouth every morning., Disp: , Rfl:    BIOTIN PO, Take 1 tablet by mouth every morning., Disp: , Rfl:    Cholecalciferol 2000 units CAPS, Take 2,000 Units by mouth daily., Disp: , Rfl:    lisinopril  (ZESTRIL ) 30 MG tablet, Take 30 mg by  mouth daily., Disp: , Rfl:    Multiple Vitamins-Minerals (ICAPS AREDS 2 PO), Take 1 capsule by mouth daily., Disp: , Rfl:    Semaglutide,0.25 or 0.5MG /DOS, (OZEMPIC, 0.25 OR 0.5 MG/DOSE,) 2 MG/1.5ML SOPN, Inject by subcutaneous route for 28 days., Disp: , Rfl:    zinc sulfate, 50mg  elemental zinc, 220 (50 Zn) MG capsule, Take 220 mg by mouth daily. (Patient not taking: Reported on 12/06/2023), Disp: , Rfl:   Review of Systems: Denies appetite changes, fevers, chills, fatigue, unexplained weight changes. Denies hearing loss, neck lumps or masses, mouth sores, ringing in ears or voice changes. Denies cough or wheezing.  Denies shortness  of breath. Denies chest pain or palpitations. Denies leg swelling. Denies abdominal distention, pain, blood in stools, constipation, diarrhea, nausea, vomiting, or early satiety. Denies pain with intercourse, dysuria, frequency, hematuria or incontinence. Denies hot flashes, pelvic pain, vaginal bleeding or vaginal discharge.   Denies joint pain, back pain or muscle pain/cramps. Denies itching, rash, or wounds. Denies dizziness, headaches, numbness or seizures. Denies swollen lymph nodes or glands, denies easy bruising or bleeding. Denies anxiety, depression, confusion, or decreased concentration.  Physical Exam: BP (!) 138/99 (BP Location: Left Arm, Patient Position: Sitting)   Pulse 98   Temp 98.3 F (36.8 C) (Temporal)   Resp 18   Wt 229 lb (103.9 kg)   LMP 12/28/2012 Comment: salpingo oophorectomy  SpO2 100%   BMI 35.87 kg/m  General: Alert, oriented, no acute distress. HEENT: Posterior oropharynx clear, sclera anicteric. Chest: Clear to auscultation bilaterally.  No wheezes or rhonchi. Cardiovascular: Regular rate and rhythm, no murmurs. Abdomen: soft, nontender.  Normoactive bowel sounds.  No masses or hepatosplenomegaly appreciated.  Well-healed incisions. Extremities: Grossly normal range of motion.  Warm, well perfused.  No edema  bilaterally. Skin: No rashes or lesions noted. Lymphatics: No cervical, supraclavicular, or inguinal adenopathy. GU: Normal appearing external genitalia without erythema, excoriation, or lesions.  Speculum exam reveals mildly atrophic vaginal mucosa, no lesions.  Bimanual exam reveals Intact, no nodularity or masses.  Rectovaginal exam confirms these findings.  Laboratory & Radiologic Studies: None new  Assessment & Plan: [name] is a 48 y.o. woman with Stage IB1 SCC of the cervix (no LVI, negative LNs) who presents for follow up. Surgery 08/2021 and 10/2021. Last pap 05/2023: NILM, HR HPV negative.  Doing well. NED on exam today.  Given her early stage, low risk disease, we will proceed with surveillance visits every 6 months.  We discussed transitioning to visits yearly next summer.  Discussed plan for cotesting once a year (due at next visit).   We reviewed signs and symptoms that would be concerning for cancer recurrence and I stressed the importance of calling if she develops any of these before her next scheduled visit.  20 minutes of total time was spent for this patient encounter, including preparation, face-to-face counseling with the patient and coordination of care, and documentation of the encounter.  Comer Dollar, MD  Division of Gynecologic Oncology  Department of Obstetrics and Gynecology  [facility]

## 2023-12-06 NOTE — Patient Instructions (Signed)
 It was good to see you today.  I do not see or feel any evidence of cancer recurrence on your exam.  I will see you for follow-up in 6 months.  As always, if you develop any new and concerning symptoms before your next visit, please call to see me sooner.

## 2023-12-27 ENCOUNTER — Ambulatory Visit
Admission: RE | Admit: 2023-12-27 | Discharge: 2023-12-27 | Disposition: A | Discharge status: Home / Self Care | Source: Ambulatory Visit | Attending: Adult Health | Admitting: Adult Health

## 2023-12-27 DIAGNOSIS — Z1231 Encounter for screening mammogram for malignant neoplasm of breast: Secondary | ICD-10-CM

## 2024-06-05 ENCOUNTER — Ambulatory Visit: Admitting: Gynecologic Oncology

## 2024-12-04 ENCOUNTER — Encounter: Admitting: Adult Health
